# Patient Record
Sex: Female | Born: 1950
Health system: Southern US, Community
[De-identification: ages and names within clinical notes are randomized; demographics above are authoritative.]

## PROBLEM LIST (undated history)

## (undated) DIAGNOSIS — Z975 Presence of (intrauterine) contraceptive device: Secondary | ICD-10-CM

## (undated) DIAGNOSIS — E785 Hyperlipidemia, unspecified: Secondary | ICD-10-CM

## (undated) DIAGNOSIS — N814 Uterovaginal prolapse, unspecified: Secondary | ICD-10-CM

## (undated) DIAGNOSIS — N329 Bladder disorder, unspecified: Secondary | ICD-10-CM

## (undated) DIAGNOSIS — K219 Gastro-esophageal reflux disease without esophagitis: Secondary | ICD-10-CM

## (undated) HISTORY — DX: Hyperlipidemia, unspecified: E78.5

## (undated) HISTORY — DX: Bladder disorder, unspecified: N32.9

## (undated) HISTORY — PX: BIOPSY BREAST: PRO8

## (undated) HISTORY — DX: Presence of (intrauterine) contraceptive device: Z97.5

## (undated) HISTORY — DX: Uterovaginal prolapse, unspecified: N81.4

## (undated) HISTORY — PX: BREAST BIOPSY: SHX20

---

## 1976-09-13 HISTORY — PX: OTHER SURGICAL HISTORY: SHX169

## 1988-09-13 HISTORY — PX: APPENDECTOMY: SHX54

## 1998-06-17 ENCOUNTER — Other Ambulatory Visit: Admission: RE | Admit: 1998-06-17 | Discharge: 1998-06-17 | Payer: Self-pay | Admitting: Gynecology

## 1999-07-31 ENCOUNTER — Other Ambulatory Visit: Admission: RE | Admit: 1999-07-31 | Discharge: 1999-07-31 | Payer: Self-pay | Admitting: Gynecology

## 1999-07-31 ENCOUNTER — Encounter: Payer: Self-pay | Admitting: Gynecology

## 1999-07-31 ENCOUNTER — Encounter: Admission: RE | Admit: 1999-07-31 | Discharge: 1999-07-31 | Payer: Self-pay | Admitting: Gynecology

## 2000-06-13 ENCOUNTER — Other Ambulatory Visit: Admission: RE | Admit: 2000-06-13 | Discharge: 2000-06-13 | Payer: Self-pay | Admitting: Obstetrics and Gynecology

## 2001-06-30 ENCOUNTER — Encounter: Payer: Self-pay | Admitting: Obstetrics and Gynecology

## 2001-06-30 ENCOUNTER — Encounter: Admission: RE | Admit: 2001-06-30 | Discharge: 2001-06-30 | Payer: Self-pay | Admitting: Obstetrics and Gynecology

## 2001-06-30 ENCOUNTER — Other Ambulatory Visit: Admission: RE | Admit: 2001-06-30 | Discharge: 2001-06-30 | Payer: Self-pay | Admitting: Obstetrics and Gynecology

## 2002-06-26 ENCOUNTER — Other Ambulatory Visit: Admission: RE | Admit: 2002-06-26 | Discharge: 2002-06-26 | Payer: Self-pay | Admitting: Obstetrics and Gynecology

## 2003-08-28 ENCOUNTER — Encounter: Payer: Self-pay | Admitting: Family Medicine

## 2003-08-28 ENCOUNTER — Encounter: Admission: RE | Admit: 2003-08-28 | Discharge: 2003-08-28 | Payer: Self-pay | Admitting: Obstetrics and Gynecology

## 2003-08-28 ENCOUNTER — Other Ambulatory Visit: Admission: RE | Admit: 2003-08-28 | Discharge: 2003-08-28 | Payer: Self-pay | Admitting: Obstetrics and Gynecology

## 2005-12-02 ENCOUNTER — Other Ambulatory Visit: Admission: RE | Admit: 2005-12-02 | Discharge: 2005-12-02 | Payer: Self-pay | Admitting: Obstetrics & Gynecology

## 2005-12-02 ENCOUNTER — Ambulatory Visit (HOSPITAL_COMMUNITY): Admission: RE | Admit: 2005-12-02 | Discharge: 2005-12-02 | Payer: Self-pay | Admitting: Obstetrics and Gynecology

## 2005-12-02 ENCOUNTER — Encounter: Payer: Self-pay | Admitting: Family Medicine

## 2005-12-20 ENCOUNTER — Encounter: Admission: RE | Admit: 2005-12-20 | Discharge: 2005-12-20 | Payer: Self-pay | Admitting: Obstetrics and Gynecology

## 2005-12-22 ENCOUNTER — Encounter (INDEPENDENT_AMBULATORY_CARE_PROVIDER_SITE_OTHER): Payer: Self-pay | Admitting: Specialist

## 2005-12-22 ENCOUNTER — Encounter: Admission: RE | Admit: 2005-12-22 | Discharge: 2005-12-22 | Payer: Self-pay | Admitting: Obstetrics and Gynecology

## 2007-01-05 ENCOUNTER — Encounter: Payer: Self-pay | Admitting: Family Medicine

## 2007-01-05 ENCOUNTER — Encounter: Admission: RE | Admit: 2007-01-05 | Discharge: 2007-01-05 | Payer: Self-pay | Admitting: Obstetrics and Gynecology

## 2007-01-05 ENCOUNTER — Other Ambulatory Visit: Admission: RE | Admit: 2007-01-05 | Discharge: 2007-01-05 | Payer: Self-pay | Admitting: Obstetrics & Gynecology

## 2007-10-06 ENCOUNTER — Encounter: Admission: RE | Admit: 2007-10-06 | Discharge: 2007-10-06 | Payer: Self-pay | Admitting: Obstetrics and Gynecology

## 2007-10-06 ENCOUNTER — Encounter: Payer: Self-pay | Admitting: Family Medicine

## 2008-06-05 IMAGING — MG MM DIAGNOSTIC BILATERAL
7 series · 7 of 7 positions shown · non-contrast
Comparison: 12-02-05.

DG DIAGNOSTIC BILATERAL
Bilateral CC and MLO view(s) were taken.
Technologist: Werner Raut

RIGHT BREAST ULTRASOUND
DIGITAL BILATERAL DIAGNOSTIC MAMMOGRAM WITH CAD AND RIGHT BREAST ULTRASOUND:
CLINICAL DATA: Benign changes in the left breast biopsied - follow-up.  Also palpable nodule in 
the lateral subareolar right breast on clinical examination.

[R CC]
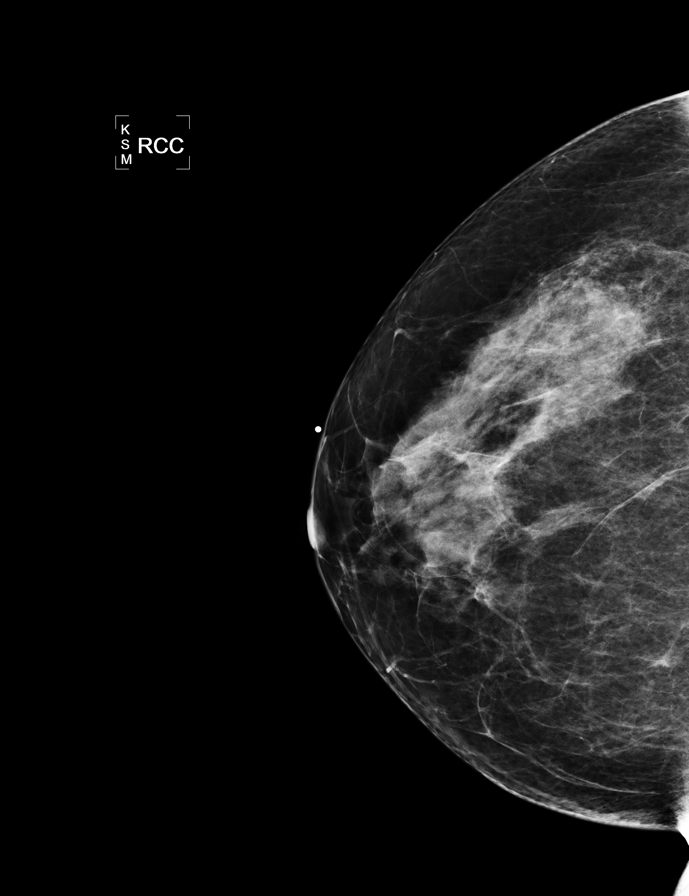

[L CC]
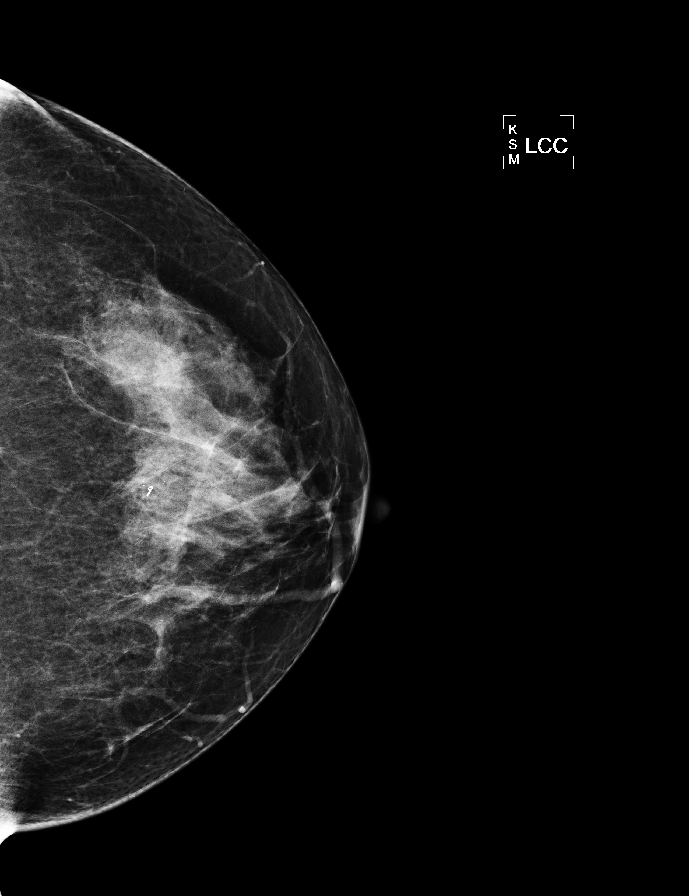

[L MLO (1 of 2)]
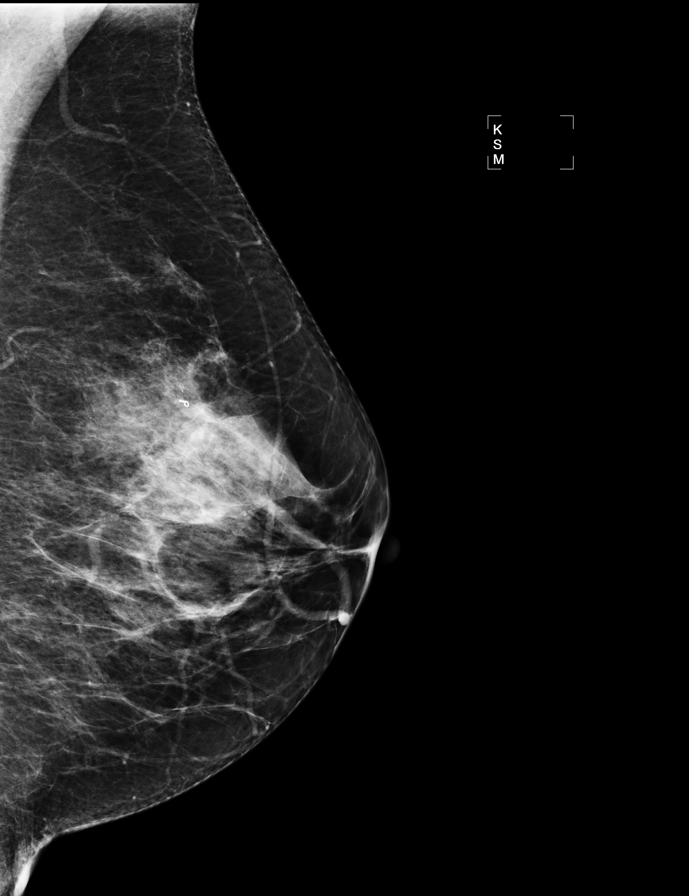

[R MLO (1 of 2)]
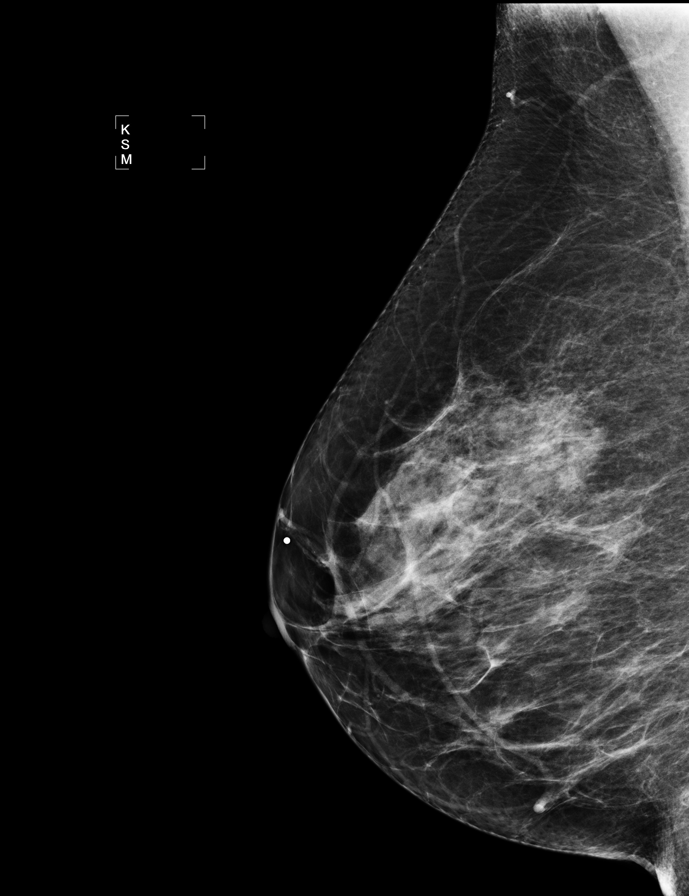

[R TAN]
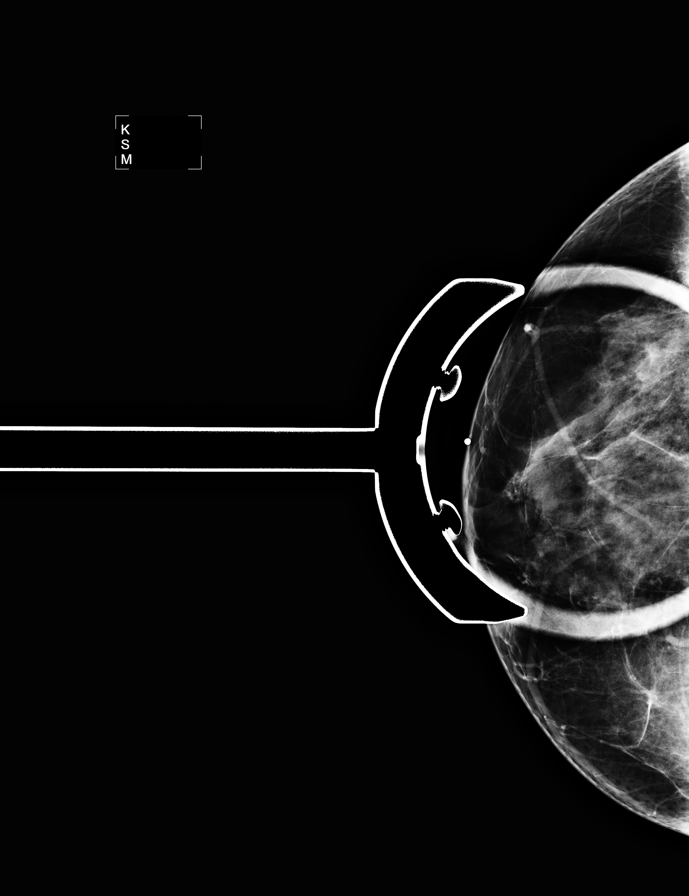

[L MLO (2 of 2)]
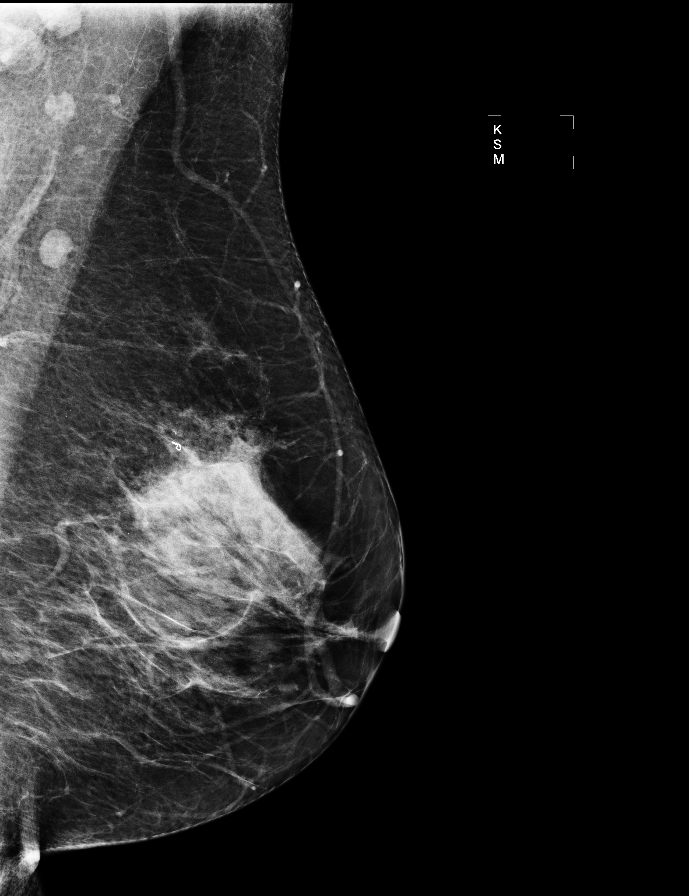

[R MLO (2 of 2)]
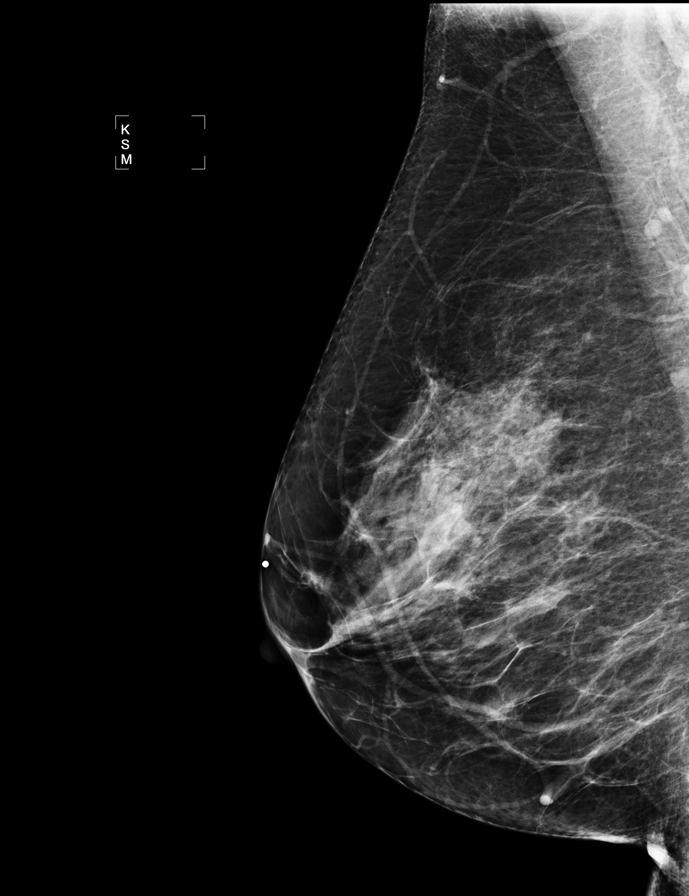

[7 of 7 positions shown; findings below may reference images not displayed]

CC, MLO views of both breasts and spot tangential view of the region of the patient's palpable 
abnormality were performed.  A biopsy clip and a few small microcalcifications are again noted in 
the upper left breast.  No new microcalcifications are identified.  There is no evidence of mass or
malignant-type calcifications identified.

Physical examination of the right breast demonstrates minimal nodularity in the lateral subareolar 
region.

Targeted ultrasound evaluation of the right breast demonstrates a 3 x 7 x 8 mm well-circumscribed 
hypoechoic nodule in the 8 o'clock position 5 cm from the nipple is identified and exhibits mild 
increased through transmission - likely benign.  There is no focal abnormality in the entire 
subareolar region.
IMPRESSION: 1.  Likely benign nodule in the inferior medial right breast -  fibroadenoma or possibly a lymph 
node.  Recommend right breast ultrasound follow-up in six months to insure stability.
2.  No evidence of abnormality in the lateral right subareolar region.
3.  Stable postbiopsy changes within the upper left breast without new microcalcifications.

ASSESSMENT: Probably benign - BI-RADS 3

Ultrasound of the right breast in 6 months.
,

## 2008-06-28 ENCOUNTER — Encounter: Admission: RE | Admit: 2008-06-28 | Discharge: 2008-06-28 | Payer: Self-pay | Admitting: Obstetrics and Gynecology

## 2008-06-28 ENCOUNTER — Encounter: Payer: Self-pay | Admitting: Family Medicine

## 2010-01-30 ENCOUNTER — Ambulatory Visit: Payer: Self-pay | Admitting: Family Medicine

## 2010-01-30 ENCOUNTER — Encounter (INDEPENDENT_AMBULATORY_CARE_PROVIDER_SITE_OTHER): Payer: Self-pay | Admitting: *Deleted

## 2010-01-30 ENCOUNTER — Other Ambulatory Visit: Admission: RE | Admit: 2010-01-30 | Discharge: 2010-01-30 | Payer: Self-pay | Admitting: Family Medicine

## 2010-01-30 LAB — HM PAP SMEAR

## 2010-02-02 LAB — CONVERTED CEMR LAB
ALT: 13 units/L (ref 0–35)
AST: 20 units/L (ref 0–37)
Albumin: 4.4 g/dL (ref 3.5–5.2)
Cholesterol: 259 mg/dL — ABNORMAL HIGH (ref 0–200)
Creatinine, Ser: 0.6 mg/dL (ref 0.4–1.2)
Direct LDL: 188.2 mg/dL
Sodium: 141 meq/L (ref 135–145)
Total Protein: 7 g/dL (ref 6.0–8.3)

## 2010-02-18 ENCOUNTER — Encounter: Payer: Self-pay | Admitting: Family Medicine

## 2010-02-18 LAB — CONVERTED CEMR LAB: Pap Smear: NEGATIVE

## 2010-06-25 ENCOUNTER — Encounter: Admission: RE | Admit: 2010-06-25 | Discharge: 2010-06-25 | Payer: Self-pay | Admitting: Family Medicine

## 2010-06-25 LAB — HM MAMMOGRAPHY

## 2010-10-04 ENCOUNTER — Encounter: Payer: Self-pay | Admitting: Family Medicine

## 2010-10-15 NOTE — Letter (Signed)
Summary: Results Follow up Letter  [facility]  [address] [place], [place] [address]   Phone: [phone]  Fax: [phone]    02/18/2010 MRN: [id number]  [facility] [address] [place], [place]  [address]  Dear Ms. Glotfelty,  The following are the results of your recent test(s):  Test         Result    Pap Smear:        Normal __X___  Not Normal _____ Comments: ______________________________________________________ Cholesterol: LDL(Bad cholesterol):         Your goal is less than:         HDL (Good cholesterol):       Your goal is more than: Comments:  ______________________________________________________ Mammogram:        Normal _____  Not Normal _____ Comments:  ___________________________________________________________________ Hemoccult:        Normal _____  Not normal _______ Comments:    _____________________________________________________________________ Other Tests:    We routinely do not discuss normal results over the telephone.  If you desire a copy of the results, or you have any questions about this information we can discuss them at your next office visit.   Sincerely,       Ruthe Mannan, MD

## 2010-10-15 NOTE — Assessment & Plan Note (Signed)
Summary: NEW PT TO ESTABH/DLO   Vital Signs:  Patient profile:   60 year old female Height:      60.5 inches Weight:      112.50 pounds BMI:     21.69 Temp:     98.0 degrees F oral Pulse rate:   64 / minute Pulse rhythm:   regular BP sitting:   110 / 70  (left arm) Cuff size:   regular  Vitals Entered By: Linde Gillis CMA Duncan Dull) (Jan 30, 2010 2:07 PM) CC: new patient, establish care   History of Present Illness: 60 yo female here to establish care.  No medical problems other than bladder prolapse. Was seeing her OBGYN for this but opted out of surgery. Considering pessary. She does not have issues with incontinence but has discomfort when she lifts heavy objects at work.  Very physically active job.  Well woman- has not had cholesterol checked in several years. Never had a colonscopy, due for mammogram. G1P1, has been post menopausal since 2003, no h/o post menopausal bleeding. No h/o abnormal pap smears.  Sexually active with one partner.  Preventive Screening-Counseling & Management  Alcohol-Tobacco     Smoking Status: never  Current Medications (verified): 1)  None  Allergies (verified): No Known Drug Allergies  Past History:  Family History: Last updated: 01/30/2010 Mom and dad both lived into 58s, unknown medical probs. Sister had uterine CA in 30s.  Social History: Last updated: 01/30/2010 Married Never Smoked Alcohol use-no Works in a mill. Has one son, 35, lives near by.  Risk Factors: Smoking Status: never (01/30/2010)  Past Medical History: Unremarkable  Past Surgical History: Appendectomy  Family History: Reviewed history and no changes required. Mom and dad both lived into 41s, unknown medical probs. Sister had uterine CA in 30s.  Social History: Reviewed history and no changes required. 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 Smoking Status:  never  Review of Systems      See  HPI General:  Denies chills, fatigue, and malaise. Eyes:  Denies blurring. ENT:  Denies difficulty swallowing. CV:  Denies chest pain or discomfort and difficulty breathing at night. Resp:  Denies shortness of breath. GI:  Denies abdominal pain, bloody stools, and change in bowel habits. GU:  Denies incontinence, urinary frequency, and urinary hesitancy. MS:  Denies joint pain, joint redness, and joint swelling. Derm:  Denies rash. Neuro:  Denies headaches. Psych:  Denies anxiety and depression. Endo:  Denies cold intolerance and heat intolerance. Heme:  Denies abnormal bruising and bleeding.  Physical Exam  General:  alert and well-developed.   Head:  normocephalic and atraumatic.   Eyes:  vision grossly intact and pupils equal.   Ears:  R ear normal and L ear normal.   Nose:  no external deformity.   Mouth:  no gingival abnormalities.   Neck:  No deformities, masses, or tenderness noted. Breasts:  No mass, nodules, thickening, tenderness, bulging, retraction, inflamation, nipple discharge or skin changes noted.   Lungs:  Normal respiratory effort, chest expands symmetrically. Lungs are clear to auscultation, no crackles or wheezes. Heart:  Normal rate and regular rhythm. S1 and S2 normal without gallop, murmur, click, rub or other extra sounds. Abdomen:  Bowel sounds positive,abdomen soft and non-tender without masses, organomegaly or hernias noted. Genitalia:  Pelvic Exam:        External: normal female genitalia without lesions or masses        Vagina:  normal without lesions or masses        Cervix: normal without lesions or masses        Adnexa: normal bimanual exam without masses or fullness        Uterus: normal by palpation        Pap smear: performed Extremities:  no edema Neurologic:  No cranial nerve deficits noted. Station and gait are normal. Plantar reflexes are down-going bilaterally. DTRs are symmetrical throughout. Sensory, motor and coordinative functions appear  intact. Psych:  Cognition and judgment appear intact. Alert and cooperative with normal attention span and concentration. No apparent delusions, illusions, hallucinations   Impression & Recommendations:  Problem # 1:  HEALTH MAINTENANCE EXAM (ICD-V70.0) Reviewed preventive care protocols, scheduled due services, and updated immunizations Discussed nutrition, exercise, diet, and healthy lifestyle.  FLP, BMET, hepatic panel today. Pap smear today. Set up colonoscopy and mammogram today. Discussed Zostavax, she will check with insurance plan to make sure it is covered. Orders: TLB-BMP (Basic Metabolic Panel-BMET) (80048-METABOL)  Other Orders: Gastroenterology Referral (GI) Radiology Referral (Radiology) Venipuncture ([address]) TLB-Lipid Panel (80061-LIPID) TLB-Hepatic/Liver Function Pnl (80076-HEPATIC) Pap Smear, Thin Prep ( Collection of) (U0454)  Patient Instructions: 1)  Great to meet you. 2)  Please stop by to see Shirlee Limerick on your way out  She will set up your mammogram and colonoscopy. 3)  We will be in touch about your labs next week.  Prior Medications (reviewed today): None Current Allergies (reviewed today): No known allergies  TD Result Date:  01/18/2002 TD Result:  historical TD Next Due:  10 yr

## 2010-10-15 NOTE — Letter (Signed)
Summary: Previsit letter  [facility]  [address] [place], [place] [address]   Phone: [phone]  Fax: [phone]       01/30/2010 MRN: [id number]  [facility] [address] [place], [place]  [address]  Dear Ms. Seehafer,  Welcome to the Gastroenterology Division at [facility].    You are scheduled to see a nurse for your pre-procedure visit on 03/27/2010 at 9:00AM on the 3rd floor at [facility], [address]  We ask that you try to arrive at our office 15 minutes prior to your appointment time to allow for check-in.  Your nurse visit will consist of discussing your medical and surgical history, your immediate family medical history, and your medications.    Please bring a complete list of all your medications or, if you prefer, bring the medication bottles and we will list them.  We will need to be aware of both prescribed and over the counter drugs.  We will need to know exact dosage information as well.  If you are on blood thinners (Coumadin, Plavix, Aggrenox, Ticlid, etc.) please call our office today/prior to your appointment, as we need to consult with your physician about holding your medication.   Please be prepared to read and sign documents such as consent forms, a financial agreement, and acknowledgement forms.  If necessary, and with your consent, a friend or relative is welcome to sit-in on the nurse visit with you.  Please bring your insurance card so that we may make a copy of it.  If your insurance requires a referral to see a specialist, please bring your referral form from your primary care physician.  No co-pay is required for this nurse visit.     If you cannot keep your appointment, please call [phone] to cancel or reschedule prior to your appointment date.  This allows [name] the opportunity to schedule an appointment for another patient in need of care.    Thank you for choosing [facility] for your medical  needs.  We appreciate the opportunity to care for you.  Please visit [name] at our website  to learn more about our practice.                     Sincerely.                                                                                                                   The Gastroenterology Division

## 2010-12-03 ENCOUNTER — Encounter: Payer: Self-pay | Admitting: Family Medicine

## 2010-12-04 ENCOUNTER — Encounter: Payer: Self-pay | Admitting: Family Medicine

## 2010-12-04 ENCOUNTER — Ambulatory Visit (INDEPENDENT_AMBULATORY_CARE_PROVIDER_SITE_OTHER): Payer: BC Managed Care – PPO | Admitting: Family Medicine

## 2010-12-04 VITALS — BP 118/70 | HR 56 | Temp 97.9°F | Wt 115.0 lb

## 2010-12-04 DIAGNOSIS — M79673 Pain in unspecified foot: Secondary | ICD-10-CM | POA: Insufficient documentation

## 2010-12-04 DIAGNOSIS — B070 Plantar wart: Secondary | ICD-10-CM

## 2010-12-04 DIAGNOSIS — M79609 Pain in unspecified limb: Secondary | ICD-10-CM

## 2010-12-04 NOTE — Progress Notes (Signed)
  Subjective:    Patient ID: Heidi Mitchell, female    DOB: [birth year], 60 y.o.   MRN: [id number]  HPI CC: sore spots on feet  Several month history of sore spots on feet, hurt to walk and stand.  Sore L mid transverse pad.  Sore R anterior to transverse pad.  H/o this in past, usually resolves with time.    No h/o DM.  No fevers/chills.  Uses tennis shoes.  Not too tight in front.  Feels comfortable.  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    Took simvastatin for 3 mo, then stopped.  Has been focusing on husband who had stroke.  Review of Systems Per HPI    Objective:   Physical Exam  [vitalsreviewed. Constitutional: She appears well-developed and well-nourished. No distress.  Musculoskeletal:       L sole: 3-4 plantar warts with callus overlying, along midline along transverse arch.  + loss of transverse arch.  Tender to palpation. R sole: 4 warts medial edge of transverse arch as well as 3 plantar warts along lateral edge.  + loss of transverse arch.          Assessment & Plan:

## 2010-12-04 NOTE — Assessment & Plan Note (Addendum)
Causing callus and leg pain. Treat with pumice stone, soaking in epsom water, and salicylic acid 40%.  No h/o DM. Discussed care of warts. Return if too irritating or any concerns. F/u next visit.  Consider podiatry referral if no improvement.

## 2010-12-04 NOTE — Patient Instructions (Addendum)
Make appointment in May with Dr. Dayton Martes. I think you have callus from plantar warts.   Treat with salicylic acid (Duofilm or mediplast):  Pumice stone to file off dead skin, then soak foot in warm salt water, then use salicylic acid for 3 days at a time.  Then take off, restart pumice stone and repeat.  If foot getting too raw, take break for several days then repeat.  Duofilm should stay dry and firmly in place.  If gets wet, reapply. Call us with questions.  If too irritation, let us know.

## 2010-12-04 NOTE — Assessment & Plan Note (Signed)
With metatarsal collapse (loss of transverse arch). Provided with metatarsal pad, small.  Fitted and applied. Pt states feeling better with walking.

## 2010-12-05 ENCOUNTER — Encounter: Payer: Self-pay | Admitting: Family Medicine

## 2011-01-27 ENCOUNTER — Other Ambulatory Visit: Payer: Self-pay | Admitting: Family Medicine

## 2011-01-27 DIAGNOSIS — Z79899 Other long term (current) drug therapy: Secondary | ICD-10-CM

## 2011-01-27 DIAGNOSIS — Z0000 Encounter for general adult medical examination without abnormal findings: Secondary | ICD-10-CM

## 2011-01-27 DIAGNOSIS — E785 Hyperlipidemia, unspecified: Secondary | ICD-10-CM | POA: Insufficient documentation

## 2011-01-29 ENCOUNTER — Other Ambulatory Visit (INDEPENDENT_AMBULATORY_CARE_PROVIDER_SITE_OTHER): Payer: BC Managed Care – PPO | Admitting: Family Medicine

## 2011-01-29 DIAGNOSIS — F1910 Other psychoactive substance abuse, uncomplicated: Secondary | ICD-10-CM

## 2011-01-29 DIAGNOSIS — E785 Hyperlipidemia, unspecified: Secondary | ICD-10-CM

## 2011-01-29 DIAGNOSIS — Z79899 Other long term (current) drug therapy: Secondary | ICD-10-CM

## 2011-01-29 LAB — LIPID PANEL
Cholesterol: 215 mg/dL — ABNORMAL HIGH (ref 0–200)
HDL: 61.1 mg/dL (ref >39.00)
Triglycerides: 33 mg/dL (ref 0.0–149.0)

## 2011-01-29 LAB — HEPATIC FUNCTION PANEL
ALT: 10 U/L (ref 0–35)
Albumin: 3.6 g/dL (ref 3.5–5.2)
Total Bilirubin: 0.9 mg/dL (ref 0.3–1.2)
Total Protein: 6.1 g/dL (ref 6.0–8.3)

## 2011-01-29 LAB — LDL CHOLESTEROL, DIRECT: Direct LDL: 164.7 mg/dL

## 2011-02-05 ENCOUNTER — Other Ambulatory Visit: Payer: BC Managed Care – PPO

## 2011-02-11 ENCOUNTER — Ambulatory Visit (INDEPENDENT_AMBULATORY_CARE_PROVIDER_SITE_OTHER): Payer: BC Managed Care – PPO | Admitting: Family Medicine

## 2011-02-11 ENCOUNTER — Encounter: Payer: Self-pay | Admitting: Family Medicine

## 2011-02-11 ENCOUNTER — Telehealth: Payer: Self-pay | Admitting: *Deleted

## 2011-02-11 VITALS — BP 110/70 | HR 52 | Temp 98.0°F | Ht 60.5 in | Wt 114.8 lb

## 2011-02-11 DIAGNOSIS — Z0000 Encounter for general adult medical examination without abnormal findings: Secondary | ICD-10-CM

## 2011-02-11 DIAGNOSIS — F411 Generalized anxiety disorder: Secondary | ICD-10-CM

## 2011-02-11 DIAGNOSIS — F419 Anxiety disorder, unspecified: Secondary | ICD-10-CM | POA: Insufficient documentation

## 2011-02-11 MED ORDER — ALPRAZOLAM 0.25 MG PO TABS: 0.2500 mg | ORAL_TABLET | Freq: Three times a day (TID) | ORAL | Status: DC | PRN

## 2011-02-11 NOTE — Telephone Encounter (Signed)
Patient is asking if she should have had a pap smear done with her cpx since she was at high risk for HPV with her last pap. Please advise.

## 2011-02-11 NOTE — Assessment & Plan Note (Signed)
Will start low dose as needed Xanax. The patient indicates understanding of these issues and agrees with the plan.

## 2011-02-11 NOTE — Assessment & Plan Note (Signed)
Reviewed preventive care protocols, scheduled due services, and updated immunizations Discussed nutrition, exercise, diet, and healthy lifestyle.  IFOB ordered today. 

## 2011-02-11 NOTE — Progress Notes (Signed)
60 yo female here to establish care.  Well woman-  Never had a colonscopy, due for mammogram. Did not keep her appointment, husband had a stroke in August.  He has some aphasia, otherwise doing well.  G1P1, has been post menopausal since 2003, no h/o post menopausal bleeding. No h/o abnormal pap smears.  Sexually active with one partner. Last pap smear was normal 01/2010.  Anxiety- having occasional panic attacks since her husband's stroke.  Always worried it will happen again. Denies any symptoms of depression. No SI or HI.  HLD- improved! Has not been taking her Simvastatin in over 6 months, working on diet. Lab Results  Component Value Date   CHOL 215* 01/29/2011   CHOL 259* 01/30/2010   Lab Results  Component Value Date   HDL 61.10 01/29/2011   HDL 16.10 01/30/2010   No results found for this basename: [facility]   Lab Results  Component Value Date   TRIG 33.0 01/29/2011   TRIG 71.0 01/30/2010   Lab Results  Component Value Date   CHOLHDL 4 01/29/2011   CHOLHDL 4 01/30/2010   Lab Results  Component Value Date   LDLDIRECT 164.7 01/29/2011   LDLDIRECT 188.2 01/30/2010     The PMH, PSH, Social History, Family History, Medications, and allergies have been reviewed in [facility], and have been updated if relevant.   Review of Systems       See HPI General:  Denies chills, fatigue, and malaise. Eyes:  Denies blurring. ENT:  Denies difficulty swallowing. CV:  Denies chest pain or discomfort and difficulty breathing at night. Resp:  Denies shortness of breath. GI:  Denies abdominal pain, bloody stools, and change in bowel habits. GU:  Denies incontinence, urinary frequency, and urinary hesitancy. MS:  Denies joint pain, joint redness, and joint swelling. Derm:  Denies rash. Neuro:  Denies headaches. Psych:  Denies anxiety and depression. Endo:  Denies cold intolerance and heat intolerance. Heme:  Denies abnormal bruising and bleeding.  Physical Exam BP 110/70  Pulse 52   Temp(Src) 98 F (36.7 C) (Oral)  Ht 5' 0.5" (1.537 m)  Wt 114 lb 12.8 oz (52.073 kg)  BMI 22.05 kg/m2  General:  alert and well-developed.   Head:  normocephalic and atraumatic.   Eyes:  vision grossly intact and pupils equal.   Ears:  R ear normal and L ear normal.   Nose:  no external deformity.   Mouth:  no gingival abnormalities.   Neck:  No deformities, masses, or tenderness noted. Breasts:  No mass, nodules, thickening, tenderness, bulging, retraction, inflamation, nipple discharge or skin changes noted.   Lungs:  Normal respiratory effort, chest expands symmetrically. Lungs are clear to auscultation, no crackles or wheezes. Heart:  Normal rate and regular rhythm. S1 and S2 normal without gallop, murmur, click, rub or other extra sounds. Abdomen:  Bowel sounds positive,abdomen soft and non-tender without masses, organomegaly or hernias noted. Extremities:  no edema Neurologic:  No cranial nerve deficits noted. Station and gait are normal. Plantar reflexes are down-going bilaterally. DTRs are symmetrical throughout. Sensory, motor and coordinative functions appear intact. Psych:  Cognition and judgment appear intact. Alert and cooperative with normal attention span and concentration. No apparent delusions, illusions, hallucinations

## 2011-02-11 NOTE — Telephone Encounter (Signed)
Patient advised as instructed via telephone. 

## 2011-02-11 NOTE — Patient Instructions (Signed)
IMPORTANT: HOW TO USE THIS INFORMATION:  This is a summary and does NOT have all possible information about this product. This information does not assure that this product is safe, effective, or appropriate for you. This information is not individual medical advice and does not substitute for the advice of your health care professional. Always ask your health care professional for complete information about this product and your specific health needs.    ALPRAZOLAM EXTENDED-RELEASE - ORAL (al-PRA-[id number]-lam)    COMMON BRAND NAME(S): Xanax XR    USES:  This medication is used to treat the panic and anxiety symptoms associated with panic disorder. Alprazolam belongs to a class of medications called benzodiazepines which act on the brain and nerves (central nervous system) to produce a calming effect. It works by enhancing the effects of a certain natural chemical in the body (GABA).    HOW TO USE:  Take this medication by mouth once daily usually in the morning; or as directed by your doctor. Do not crush or chew extended-release tablets. Doing so can release all of the drug at once, increasing the risk of side effects. Also, do not split the tablets unless they have a score line and your doctor or pharmacist tells you to do so. Swallow the whole or split tablet without crushing or chewing. This medication may cause withdrawal reactions, especially if it has been used regularly for a long time or in high doses. In such cases, withdrawal symptoms (such as seizures) may occur if you suddenly stop using this medication. To prevent withdrawal reactions, your doctor may reduce your dose gradually. Consult your doctor or pharmacist for more details, and report any withdrawal reactions immediately. Though it is very unlikely to occur, this medication can also result in abnormal drug-seeking behavior (addiction/habit-forming). Do not increase your dose, take it more frequently, or use it for a longer period of time than  prescribed. Properly stop the medication when so directed. This will lessen the chances of becoming addicted. When used for an extended period, this medication may not work as well and may require different dosing. Talk with your doctor if this medication stops working well. Inform your doctor if your condition persists or worsens.    SIDE EFFECTS:  Drowsiness, dizziness, headache, tiredness, clumsiness, decreased interest in sex, decreased appetite, nausea, constipation, or joint pain may occur. If any of these effects persist or worsen, notify your doctor or pharmacist promptly. Remember that your doctor has prescribed this medication because he or she has judged that the benefit to you is greater than the risk of side effects. Many people using this medication do not have serious side effects. Tell your doctor immediately if any of these serious side effects occur: mental/mood changes, confusion, slurred speech or difficulty speaking, difficulty remembering things. An allergic reaction to this drug is unlikely, but seek immediate medical attention if it occurs. Symptoms of an allergic reaction include: rash, itching/swelling (especially of the face/tongue/throat), severe dizziness, trouble breathing. This is not a complete list of possible side effects. If you notice other effects not listed above, contact your doctor or pharmacist. In the [name] - Call your doctor for medical advice about side effects. You may report side effects to FDA at 1-800-FDA-1088. In [place] - Call your doctor for medical advice about side effects. You may report side effects to [facility] [place] at [phone].    PRECAUTIONS:  Before taking alprazolam, tell your doctor or pharmacist if you are allergic to it; or to other benzodiazepines (  e.g., diazepam, lorazepam); or if you have any other allergies. This product may contain inactive ingredients, which can cause allergic reactions or other problems. Talk to your pharmacist for more  details. Before using this medication, tell your doctor or pharmacist your medical history, especially of: kidney disease, liver disease, severe lung/breathing problems (e.g., COPD, sleep apnea), drug or alcohol abuse. This drug may make you dizzy or drowsy. Do not drive, use machinery, or do any activity that requires alertness until you are sure you can perform such activities safely. Avoid alcoholic beverages. Caution is advised when using this drug in the elderly because they may be more sensitive to its side effects, especially loss of coordination and drowsiness. This medication is not recommended for use during pregnancy due to the potential for harm to an unborn baby. Consult your doctor for more details. This drug passes into breast milk and may have undesirable effects on a nursing infant. Therefore, breast-feeding while using this medication is not recommended. Consult your doctor before breast-feeding.    DRUG INTERACTIONS:  See also the How to Use section. Your healthcare professionals (e.g., doctor or pharmacist) may already be aware of any possible drug interactions and may be monitoring you for it. Do not start, stop or change the dosage of any medicine before checking with them first. This drug should not be used with the following medications because very serious interactions may occur: certain azole antifungals (e.g., itraconazole, ketoconazole), delavirdine, indinavir, sodium oxybate. If you are currently using any of these medications, tell your doctor or pharmacist before starting alprazolam. Before using this medication, tell your doctor or pharmacist of all prescription and nonprescription/herbal products you may use, especially of: clozapine, digoxin, disulfiram, kava. Tell your doctor or pharmacist if you take drugs that affect the removal of alprazolam from your system (CYP 3A4 substrates, inhibitors and inducers) such as: other azole antifungals (e.g., fluconazole, voriconazole),  certain anti-depressants (e.g., fluoxetine, fluvoxamine, nefazodone), certain anti-seizure medications (e.g., phenytoin, phenobarbital), cimetidine, macrolide antibiotics (e.g., erythromycin, clarithromycin), rifamycins (e.g., rifampin), ritonavir, St John's wort. Tell your doctor or pharmacist if you take drugs that cause drowsiness such as: antihistamines that cause drowsiness (e.g., diphenhydramine), anti-seizure drugs (e.g., carbamazepine), medicine for sleep (e.g., sedatives), muscle relaxants, narcotic pain relievers (e.g., codeine), psychiatric medicines (e.g., phenothiazines such as chlorpromazine, tricyclic anti-depressants such as amitriptyline), tranquilizers. Check the labels on all your medicines (e.g., cough-and-cold products) because they may contain ingredients which cause drowsiness. Ask your pharmacist about the safe use of those products. Cigarette smoking decreases blood levels of this medication (through liver enzyme induction). Tell your doctor if you smoke or if you have recently stopped smoking because your dose may need to be adjusted. Do not start or stop any medicine without doctor or pharmacist approval. This document does not contain all possible interactions. Therefore, before using this product, tell your doctor or pharmacist of all the products you use. Keep a list of all your medications with you, and share the list with your doctor and pharmacist.    OVERDOSE:  If overdose is suspected, contact your local poison control center or emergency room immediately. [name] residents can call the [name] national poison hotline at [phone]. [place] residents should call their local poison control center directly. Symptoms of overdose may include: severe drowsiness, slowed/reduced reflexes, slowed breathing, loss of consciousness.    NOTES:  Do not share this medication with others. It is against the law. If this drug is used for an extended period of time, laboratory and/or medical  tests  (e.g., liver function tests, complete blood count) may be performed periodically to check for side effects. Consult your doctor for more details.    MISSED DOSE:  If you miss a dose, take it as soon as you remember. If it is near the time of the next dose, skip the missed dose and resume your usual dosing schedule. Do not double the dose to catch up.    STORAGE:  Store at room temperature (77 degrees F or 25 degrees C) away from light and moisture. Brief storage between 59-86 degrees F (15-30 degrees C) is permitted. Do not store in the bathroom. Keep all medicines away from children and pets. Do not flush medications down the toilet or pour them into a drain unless instructed to do so. Properly discard this product when it is expired or no longer needed. Consult your pharmacist or local waste disposal company for more details about how to safely discard your product.    Information last revised June 2010 Copyright(c) 2010 [employer].

## 2011-02-11 NOTE — Telephone Encounter (Signed)
Yes please apologize to her that I only saw that it was negative for malignancy when I was reviewing her chart.  Meaning that it was a negative pap but positive for HPV. Have her come in at her convenience and we will recheck a pap free of charge. If it comes back normal AND neg for HPV, we can continue with 2-3 year screening. Once again, please apologize for me. Thank you.

## 2011-02-16 ENCOUNTER — Other Ambulatory Visit: Payer: BC Managed Care – PPO

## 2011-02-16 ENCOUNTER — Other Ambulatory Visit: Payer: Self-pay | Admitting: Family Medicine

## 2011-02-16 ENCOUNTER — Encounter: Payer: Self-pay | Admitting: *Deleted

## 2011-02-16 DIAGNOSIS — Z1211 Encounter for screening for malignant neoplasm of colon: Secondary | ICD-10-CM

## 2011-02-16 LAB — FECAL OCCULT BLOOD, IMMUNOCHEMICAL: Fecal Occult Bld: NEGATIVE

## 2011-02-19 ENCOUNTER — Ambulatory Visit: Payer: BC Managed Care – PPO | Admitting: Family Medicine

## 2011-02-26 ENCOUNTER — Ambulatory Visit (INDEPENDENT_AMBULATORY_CARE_PROVIDER_SITE_OTHER): Payer: BC Managed Care – PPO | Admitting: Family Medicine

## 2011-02-26 ENCOUNTER — Other Ambulatory Visit (HOSPITAL_COMMUNITY)
Admission: RE | Admit: 2011-02-26 | Discharge: 2011-02-26 | Disposition: A | Discharge status: Home / Self Care | Payer: BC Managed Care – PPO | Source: Ambulatory Visit | Attending: Family Medicine | Admitting: Family Medicine

## 2011-02-26 ENCOUNTER — Encounter: Payer: Self-pay | Admitting: Family Medicine

## 2011-02-26 VITALS — BP 102/62 | HR 60 | Temp 98.7°F | Wt 114.0 lb

## 2011-02-26 DIAGNOSIS — Z01419 Encounter for gynecological examination (general) (routine) without abnormal findings: Secondary | ICD-10-CM

## 2011-02-26 DIAGNOSIS — R87810 Cervical high risk human papillomavirus (HPV) DNA test positive: Secondary | ICD-10-CM | POA: Insufficient documentation

## 2011-02-26 NOTE — Progress Notes (Signed)
Addended by: Gilmer Mor on: 02/26/2011 02:24 PM   Modules accepted: Orders

## 2011-02-26 NOTE — Progress Notes (Signed)
60 yo female here for pap smear only.   G1P1, has been post menopausal since 2003, no h/o post menopausal bleeding.   Sexually active with one partner. Last pap smear was neg for malignancy in 01/2010 but pos for HPV. She denies any dysuria, vaginal discharge or abdominal discomfort.     The PMH, PSH, Social History, Family History, Medications, and allergies have been reviewed in [facility], and have been updated if relevant.   Review of Systems       See HPI ing and bleeding.  Physical Exam BP 102/62  Pulse 60  Temp(Src) 98.7 F (37.1 C) (Oral)  Wt 114 lb (51.71 kg) General:  Well-developed,well-nourished,in no acute distress; alert,appropriate and cooperative throughout examination Head:  normocephalic and atraumatic.   Mouth:  good dentition.   Neck:  No deformities, masses, or tenderness noted. Rectal:  no external abnormalities.   Genitalia:  Pelvic Exam:        External: normal female genitalia without lesions or masses        Vagina: normal without lesions or masses        Cervix: normal without lesions or masses        Adnexa: normal bimanual exam without masses or fullness        Uterus: normal by palpation        Pap smear: performed Neurologic:  alert & oriented X3 and gait normal.   Skin:  Intact without suspicious lesions or rashes Psych:  Cognition and judgment appear intact. Alert and cooperative with normal attention span and concentration. No apparent delusions, illusions, hallucinations  1. Gynecological examination   Pap performed today.

## 2011-03-05 ENCOUNTER — Other Ambulatory Visit: Payer: Self-pay | Admitting: Family Medicine

## 2011-03-05 DIAGNOSIS — R87811 Vaginal high risk human papillomavirus (HPV) DNA test positive: Secondary | ICD-10-CM

## 2012-03-31 ENCOUNTER — Other Ambulatory Visit (INDEPENDENT_AMBULATORY_CARE_PROVIDER_SITE_OTHER): Payer: BC Managed Care – PPO

## 2012-03-31 ENCOUNTER — Other Ambulatory Visit: Payer: Self-pay | Admitting: Family Medicine

## 2012-03-31 DIAGNOSIS — E785 Hyperlipidemia, unspecified: Secondary | ICD-10-CM

## 2012-03-31 DIAGNOSIS — Z0000 Encounter for general adult medical examination without abnormal findings: Secondary | ICD-10-CM

## 2012-03-31 LAB — COMPREHENSIVE METABOLIC PANEL
Albumin: 4 g/dL (ref 3.5–5.2)
Alkaline Phosphatase: 49 U/L (ref 39–117)
CO2: 28 mEq/L (ref 19–32)
Chloride: 104 mEq/L (ref 96–112)
Glucose, Bld: 101 mg/dL — ABNORMAL HIGH (ref 70–99)
Potassium: 4 mEq/L (ref 3.5–5.1)
Sodium: 139 mEq/L (ref 135–145)
Total Bilirubin: 1.2 mg/dL (ref 0.3–1.2)

## 2012-03-31 LAB — LIPID PANEL
Cholesterol: 261 mg/dL — ABNORMAL HIGH (ref 0–200)
Triglycerides: 64 mg/dL (ref 0.0–149.0)
VLDL: 12.8 mg/dL (ref 0.0–40.0)

## 2012-04-03 ENCOUNTER — Other Ambulatory Visit: Payer: BC Managed Care – PPO

## 2012-04-07 ENCOUNTER — Other Ambulatory Visit: Payer: BC Managed Care – PPO

## 2012-04-13 ENCOUNTER — Inpatient Hospital Stay (HOSPITAL_COMMUNITY): Admission: RE | Admit: 2012-04-13 | Payer: Self-pay | Source: Ambulatory Visit

## 2012-04-13 ENCOUNTER — Other Ambulatory Visit (HOSPITAL_COMMUNITY)
Admission: RE | Admit: 2012-04-13 | Discharge: 2012-04-13 | Disposition: A | Discharge status: Home / Self Care | Payer: BC Managed Care – PPO | Source: Ambulatory Visit | Attending: Family Medicine | Admitting: Family Medicine

## 2012-04-13 ENCOUNTER — Encounter: Payer: Self-pay | Admitting: Family Medicine

## 2012-04-13 ENCOUNTER — Ambulatory Visit (INDEPENDENT_AMBULATORY_CARE_PROVIDER_SITE_OTHER): Payer: BC Managed Care – PPO | Admitting: Family Medicine

## 2012-04-13 VITALS — BP 98/70 | HR 48 | Temp 97.6°F | Ht 60.5 in | Wt 113.0 lb

## 2012-04-13 DIAGNOSIS — Z1211 Encounter for screening for malignant neoplasm of colon: Secondary | ICD-10-CM

## 2012-04-13 DIAGNOSIS — Z1151 Encounter for screening for human papillomavirus (HPV): Secondary | ICD-10-CM | POA: Insufficient documentation

## 2012-04-13 DIAGNOSIS — E785 Hyperlipidemia, unspecified: Secondary | ICD-10-CM

## 2012-04-13 DIAGNOSIS — F419 Anxiety disorder, unspecified: Secondary | ICD-10-CM

## 2012-04-13 DIAGNOSIS — Z1231 Encounter for screening mammogram for malignant neoplasm of breast: Secondary | ICD-10-CM

## 2012-04-13 DIAGNOSIS — Z01419 Encounter for gynecological examination (general) (routine) without abnormal findings: Secondary | ICD-10-CM | POA: Insufficient documentation

## 2012-04-13 DIAGNOSIS — F411 Generalized anxiety disorder: Secondary | ICD-10-CM

## 2012-04-13 DIAGNOSIS — Z0000 Encounter for general adult medical examination without abnormal findings: Secondary | ICD-10-CM

## 2012-04-13 MED ORDER — ALPRAZOLAM 0.25 MG PO TABS: 0.2500 mg | ORAL_TABLET | Freq: Three times a day (TID) | ORAL | Status: DC | PRN

## 2012-04-13 MED ORDER — SIMVASTATIN 10 MG PO TABS: 10.0000 mg | ORAL_TABLET | Freq: Every day | ORAL | Status: DC

## 2012-04-13 NOTE — Patient Instructions (Signed)
Great to see you. Please restart your Zocor- we can recheck your cholesterol in 3 months. Please stop by to see Shirlee Limerick on your way out to set up your mammogram.

## 2012-04-13 NOTE — Progress Notes (Signed)
61 yo here for CPX.  Never had a colonscopy, due for mammogram. She is refusing colonoscopy and zostavax.  G1P1, has been post menopausal since 2003, no h/o post menopausal bleeding. No h/o abnormal pap smears.  Sexually active with one partner. Last pap smear was neg for cancer but positive for HPV in 02/2011.  Had a colpo afterwards- neg.  Anxiety- still having occasional panic attacks since her husband's stroke although much less often now that he is recovering. Denies any symptoms of depression. No SI or HI.  HLD- deteriorated.  Admits to not taking her zocor after a couple of months.  Did not cause muscle aches or other side effects. Lab Results  Component Value Date   CHOL 261* 03/31/2012   HDL 64.80 03/31/2012   LDLDIRECT 166.5 03/31/2012   TRIG 64.0 03/31/2012   CHOLHDL 4 03/31/2012      The PMH, PSH, Social History, Family History, Medications, and allergies have been reviewed in [facility], and have been updated if relevant.   Review of Systems       See HPI General:  Denies chills, fatigue, and malaise. Eyes:  Denies blurring. ENT:  Denies difficulty swallowing. CV:  Denies chest pain or discomfort and difficulty breathing at night. Resp:  Denies shortness of breath. GI:  Denies abdominal pain, bloody stools, and change in bowel habits. GU:  Denies incontinence, urinary frequency, and urinary hesitancy. MS:  Denies joint pain, joint redness, and joint swelling. Derm:  Denies rash. Neuro:  Denies headaches. Psych:  Denies anxiety and depression. Endo:  Denies cold intolerance and heat intolerance. Heme:  Denies abnormal bruising and bleeding.  Physical Exam BP 98/70  Pulse 48  Temp 97.6 F (36.4 C)  Ht 5' 0.5" (1.537 m)  Wt 113 lb (51.256 kg)  BMI 21.71 kg/m2   General:  Well-developed,well-nourished,in no acute distress; alert,appropriate and cooperative throughout examination Head:  normocephalic and atraumatic.   Eyes:  vision grossly intact, pupils equal,  pupils round, and pupils reactive to light.   Ears:  R ear normal and L ear normal.   Nose:  no external deformity.   Mouth:  good dentition.   Neck:  No deformities, masses, or tenderness noted. Breasts:  No mass, nodules, thickening, tenderness, bulging, retraction, inflamation, nipple discharge or skin changes noted.   Lungs:  Normal respiratory effort, chest expands symmetrically. Lungs are clear to auscultation, no crackles or wheezes. Heart:  Normal rate and regular rhythm. S1 and S2 normal without gallop, murmur, click, rub or other extra sounds. Abdomen:  Bowel sounds positive,abdomen soft and non-tender without masses, organomegaly or hernias noted. Rectal:  no external abnormalities.   Genitalia:  Pelvic Exam:        External: normal female genitalia without lesions or masses        Vagina: normal without lesions or masses        Cervix: normal without lesions or masses        Adnexa: normal bimanual exam without masses or fullness        Uterus: normal by palpation        Pap smear: performed Msk:  No deformity or scoliosis noted of thoracic or lumbar spine.   Extremities:  No clubbing, cyanosis, edema, or deformity noted with normal full range of motion of all joints.   Neurologic:  alert & oriented X3 and gait normal.   Skin:  Intact without suspicious lesions or rashes Cervical Nodes:  No lymphadenopathy noted Axillary Nodes:  No palpable  lymphadenopathy Psych:  Cognition and judgment appear intact. Alert and cooperative with normal attention span and concentration. No apparent delusions, illusions, hallucinations   Assessment and Plan: 1. Routine general medical examination at a health care facility  Reviewed preventive care protocols, scheduled due services, and updated immunizations Discussed nutrition, exercise, diet, and healthy lifestyle.  Cytology - PAP  2. Hyperlipidemia  Deteriorated.  She agreed to restart the zocor.   3. Anxiety  Rx for xanax refilled.   4.  Other screening mammogram  MM Digital Screening  5. Screening for colon cancer  Refusing colonoscopy. IFOB ordered. Fecal occult blood, imunochemical

## 2012-04-18 ENCOUNTER — Encounter: Payer: Self-pay | Admitting: Family Medicine

## 2012-04-18 ENCOUNTER — Encounter: Payer: Self-pay | Admitting: *Deleted

## 2012-04-18 ENCOUNTER — Other Ambulatory Visit: Payer: BC Managed Care – PPO

## 2012-04-18 DIAGNOSIS — Z1211 Encounter for screening for malignant neoplasm of colon: Secondary | ICD-10-CM

## 2012-04-18 LAB — FECAL OCCULT BLOOD, IMMUNOCHEMICAL: Fecal Occult Bld: NEGATIVE

## 2012-04-19 ENCOUNTER — Encounter: Payer: Self-pay | Admitting: *Deleted

## 2012-04-19 ENCOUNTER — Encounter: Payer: Self-pay | Admitting: Family Medicine

## 2012-04-21 ENCOUNTER — Telehealth: Payer: Self-pay

## 2012-04-21 NOTE — Telephone Encounter (Signed)
Her pap smear was neg for cancer.  Please ask pathology if they can run HPV- I don't see those results. Thanks.

## 2012-04-21 NOTE — Telephone Encounter (Signed)
Pt got letter that pap smear was free of CA cells. Pt said for 3 years pt had cervical cancer virus; is that cleared now?Please advise.

## 2012-04-25 NOTE — Telephone Encounter (Signed)
Add- on request faxed to cytology.  Left message asking pt to call back.

## 2012-04-26 NOTE — Telephone Encounter (Signed)
Advised pt that I will call her when HPV results are available.

## 2012-04-28 ENCOUNTER — Encounter: Payer: Self-pay | Admitting: *Deleted

## 2012-04-28 ENCOUNTER — Ambulatory Visit
Admission: RE | Admit: 2012-04-28 | Discharge: 2012-04-28 | Disposition: A | Discharge status: Home / Self Care | Payer: BC Managed Care – PPO | Source: Ambulatory Visit | Attending: Family Medicine | Admitting: Family Medicine

## 2012-04-28 DIAGNOSIS — Z1231 Encounter for screening mammogram for malignant neoplasm of breast: Secondary | ICD-10-CM

## 2012-08-31 ENCOUNTER — Telehealth: Payer: Self-pay | Admitting: Family Medicine

## 2012-08-31 NOTE — Telephone Encounter (Signed)
Patient Information:  Caller Name: Amantha  Phone: [phone]  Patient: Heidi Mitchell, Heidi Mitchell  Gender: Female  DOB: 1950-10-12  Age: 61 Years  PCP: Ruthe Mannan [facility])  Office Follow Up:  Does the office need to follow up with this patient?: No  Instructions For The Office: N/A   Symptoms  Reason For Call & Symptoms: Having break through bleeding, especially when lifting heavy objects.  Reviewed Health History In EMR: Yes  Reviewed Medications In EMR: Yes  Reviewed Allergies In EMR: Yes  Reviewed Surgeries / Procedures: Yes  Date of Onset of Symptoms: 08/17/2012  Guideline(s) Used:  Postpartum - Vaginal Bleeding and Lochia  Vaginal Bleeding - Abnormal  Disposition Per Guideline:   See Within 2 Weeks in Office  Reason For Disposition Reached:   Bleeding or spotting between regular periods occurs more than two cycles (2 months),  Advice Given:  N/A  Appointment Scheduled:  09/05/2012 08:15:54 Appointment Scheduled Provider:  Kerby Nora (Family Practice)

## 2012-08-31 NOTE — Telephone Encounter (Signed)
Noted  

## 2012-09-05 ENCOUNTER — Ambulatory Visit (INDEPENDENT_AMBULATORY_CARE_PROVIDER_SITE_OTHER): Payer: BC Managed Care – PPO | Admitting: Family Medicine

## 2012-09-05 ENCOUNTER — Encounter: Payer: Self-pay | Admitting: Family Medicine

## 2012-09-05 VITALS — BP 112/62 | HR 54 | Temp 97.6°F | Ht 65.0 in | Wt 116.8 lb

## 2012-09-05 DIAGNOSIS — N950 Postmenopausal bleeding: Secondary | ICD-10-CM | POA: Insufficient documentation

## 2012-09-05 LAB — POCT URINALYSIS DIPSTICK
Bilirubin, UA: NEGATIVE
Glucose, UA: NEGATIVE
Nitrite, UA: NEGATIVE
Spec Grav, UA: 1.01
Urobilinogen, UA: 0.2

## 2012-09-05 NOTE — Progress Notes (Signed)
Subjective:    Patient ID: Heidi Mitchell, female    DOB: 25-May-1951, 61 y.o.   MRN: [id number]  HPI  61 year old post menopausal female pt of Dr. Elmer Sow presents with vaginal bleeding, spotting lightly in last 2-3 months. Started as small streak on underware then has increased some.  She has history of "fallen bladder". She comes in now because she noted a larger amount 4 days ago... No clots, made commode pink. Mild lower abdominal soreness. Has history of constipation. No unexpected weight loss. No new night sweats, no fever.  no dysuria. No pain with BMS.  Has not seen blood in urine or BM.   She see a GYN Dr. Genice Rouge. Last saw her about 2 years ago.  She has history of HPV, colposcopy performed.   Went through menopause at age [age] years ago.  Family cancer: sister with cervical cancer?  Review of Systems  Constitutional: Negative for fever and fatigue.  HENT: Negative for ear pain.   Eyes: Negative for pain.  Respiratory: Negative for chest tightness and shortness of breath.   Cardiovascular: Negative for chest pain, palpitations and leg swelling.  Gastrointestinal: Negative for abdominal pain.  Genitourinary: Negative for dysuria.       Objective:   Physical Exam  Constitutional: Vital signs are normal. She appears well-developed and well-nourished. She is cooperative.  Non-toxic appearance. She does not appear ill. No distress.  HENT:  Head: Normocephalic.  Right Ear: Hearing, tympanic membrane, external ear and ear canal normal. Tympanic membrane is not erythematous, not retracted and not bulging.  Left Ear: Hearing, tympanic membrane, external ear and ear canal normal. Tympanic membrane is not erythematous, not retracted and not bulging.  Nose: No mucosal edema or rhinorrhea. Right sinus exhibits no maxillary sinus tenderness and no frontal sinus tenderness. Left sinus exhibits no maxillary sinus tenderness and no frontal sinus tenderness.  Mouth/Throat: Uvula is  midline, oropharynx is clear and moist and mucous membranes are normal.  Eyes: Conjunctivae normal, EOM and lids are normal. Pupils are equal, round, and reactive to light. No foreign bodies found.  Neck: Trachea normal and normal range of motion. Neck supple. Carotid bruit is not present. No mass and no thyromegaly present.  Cardiovascular: Normal rate, regular rhythm, S1 normal, S2 normal, normal heart sounds, intact distal pulses and normal pulses.  Exam reveals no gallop and no friction rub.   No murmur heard. Pulmonary/Chest: Effort normal and breath sounds normal. Not tachypneic. No respiratory distress. She has no decreased breath sounds. She has no wheezes. She has no rhonchi. She has no rales.  Abdominal: Soft. Normal appearance and bowel sounds are normal. There is no tenderness. Hernia confirmed negative in the right inguinal area and confirmed negative in the left inguinal area.  Genitourinary: Rectum normal. Rectal exam shows no external hemorrhoid, no internal hemorrhoid, no fissure, no mass, no tenderness and anal tone normal. Guaiac negative stool. There is no rash, tenderness, lesion or injury on the right labia. There is no rash, tenderness, lesion or injury on the left labia. Uterus is not deviated, not enlarged, not fixed and not tender. Cervix exhibits no motion tenderness, no discharge and no friability. Right adnexum displays no mass, no tenderness and no fullness. Left adnexum displays no mass, no tenderness and no fullness. No erythema, tenderness or bleeding around the vagina. No foreign body around the vagina. No signs of injury around the vagina. No vaginal discharge found.  Lymphadenopathy:       Right:  No inguinal adenopathy present.       Left: No inguinal adenopathy present.  Neurological: She is alert.  Skin: Skin is warm, dry and intact. No rash noted.  Psychiatric: Her speech is normal and behavior is normal. Judgment and thought content normal. Her mood appears not  anxious. Cognition and memory are normal. She does not exhibit a depressed mood.          Assessment & Plan:

## 2012-09-05 NOTE — Patient Instructions (Addendum)
Stop at front desk to set up GYN referral.

## 2012-09-05 NOTE — Assessment & Plan Note (Signed)
Urine showed no blood. No clear sign of blood from rectum. Most likely vaginal source. On GYN exam: no clear source of bleeding from aginal, external or cervix. Will refer to GYN for endometrial biopsy. Will hold on [name] until then given they likely have there.

## 2012-11-27 ENCOUNTER — Encounter: Payer: Self-pay | Admitting: Obstetrics and Gynecology

## 2012-12-01 ENCOUNTER — Encounter: Payer: Self-pay | Admitting: Obstetrics and Gynecology

## 2012-12-01 ENCOUNTER — Ambulatory Visit (INDEPENDENT_AMBULATORY_CARE_PROVIDER_SITE_OTHER): Payer: BC Managed Care – PPO | Admitting: Obstetrics and Gynecology

## 2012-12-01 ENCOUNTER — Telehealth: Payer: Self-pay | Admitting: Obstetrics and Gynecology

## 2012-12-01 VITALS — BP 120/66 | Wt 117.0 lb

## 2012-12-01 DIAGNOSIS — N814 Uterovaginal prolapse, unspecified: Secondary | ICD-10-CM

## 2012-12-01 MED ORDER — ESTROGENS, CONJUGATED 0.625 MG/GM VA CREA: TOPICAL_CREAM | Freq: Every day | VAGINAL | Status: DC

## 2012-12-01 NOTE — Progress Notes (Signed)
62 y.o. MarriedWhite female G1P1001 here for pessary insertion. She was fitted Nov 10, 2012 with a #3 ring with support, and is here for insertion and instructions.   Exam:   BP 120/66  Wt 117 lb (53.071 kg)  BMI 19.47 kg/m2  LMP 11/10/2012 General appearance: alert, cooperative and appears stated age    Pelvic: External genitalia:  no lesions              Urethra: not indicated and normal appearing urethra with no masses, tenderness or lesions              Bartholins and Skenes: Bartholin's, Urethra, Skene's normal                 Vagina:  atrophicGr 2 cystocele and cervix about 3 cm behind introitus              Cervix: normal appearance Bimanual Exam:  Uterus:  uterus is normal size, shape, consistency and nontender                               Adnexa:    not indicated                               Anus:  defer exam  Pessary was inserted  without difficulty. Pt was shown pessary, and how to apply estrogen cream.  Fully instructed and questions answered. She sees here PCP for annual exams and paps so she may return here prn.  Rx sent for premarin cream to use around ring of pessary.     An After Visit Summary was printed and given to the patient.

## 2012-12-01 NOTE — Patient Instructions (Signed)
Prolapse  Prolapse means the falling down, bulging, dropping, or drooping of a body part. Organs that commonly prolapse include the rectum, small intestine, bladder, urethra, vagina (birth canal), uterus (womb), and cervix. Prolapse occurs when the ligaments and muscle tissue around the rectum, bladder, and uterus are damaged or weakened.  CAUSES  This happens especially with:  Childbirth. Some women feel pelvic pressure or have trouble holding their urine right after childbirth, because of stretching and tearing of pelvic tissues. This generally gets better with time and the feeling usually goes away, but it may return with aging.  Chronic heavy lifting.  Aging.  Menopause, with loss of estrogen production weakening the pelvic ligaments and muscles.  Past pelvic surgery.  Obesity.  Chronic constipation.  Chronic cough. Prolapse may affect a single organ, or several organs may prolapse at the same time. The front wall of the vagina holds up the bladder. The back wall holds up part of the lower intestine, or rectum. The uterus fills a spot in the middle. All these organs can be involved when the ligaments and muscles around the vagina relax too much. This often gets worse when women stop producing estrogen (menopause). SYMPTOMS  Uncontrolled loss of urine (incontinence) with cough, sneeze, straining, and exercise.  More force may be required to have a bowel movement, due to trapping of the stool.  When part of an organ bulges through the opening of the vagina, there is sometimes a feeling of heaviness or pressure. It may feel as though something is falling out. This sensation increases with coughing or bearing down.  If the organs protrude through the opening of the vagina and rub against the clothing, there may be soreness, ulcers, infection, pain, and bleeding.  Lower back pain.  Pushing in the upper or lower part of the vagina, to pass urine or have a bowel movement.  Problems  having sexual intercourse.  Being unable to insert a tampon or applicator. DIAGNOSIS  Usually, a physical exam is all that is needed to identify the problem. During the examination, you may be asked to cough and strain while lying down, sitting up, and standing up. Your caregiver will determine if more testing is required, such as bladder function tests. Some diagnoses are:  Cystocele: Bulging and falling of the bladder into the top of the vagina.  Rectocele: Part of the rectum bulging into the vagina.  Prolapse of the uterus: The uterus falls or drops into the vagina.  Enterocele: Bulging of the top of the vagina, after a hysterectomy (uterus removal), with the small intestine bulging into the vagina. A hernia in the top of the vagina.  Urethrocele: The urethra (urine carrying tube) bulging into the vagina. TREATMENT  In most cases, prolapse needs to be treated only if it produces symptoms. If the symptoms are interfering with your usual daily or sexual activities, treatment may be necessary. The following are some measures that may be used to treat prolapse.  Estrogen may help elderly women with mild prolapse.  Kegel exercises may help mild cases of prolapse, by strengthening and tightening the muscles of the pelvic floor.  Pessaries are used in women who choose not to, or are unable to, have surgery. A pessary is a doughnut-shaped piece of plastic or rubber that is put into the vagina to keep the organs in place. This device must be fitted by your caregiver. Your caregiver will also explain how to care for yourself with the pessary. If it works well for you,   to keep the organs in place. This device must be fitted by your caregiver. Your caregiver will also explain how to care for yourself with the pessary. If it works well for you, this may be the only treatment required.   Surgery is often the only form of treatment for more severe prolapses. There are different types of surgery available. You should discuss what the best procedure is for you. If the uterus is prolapsed, it may be removed (hysterectomy) as part of the surgical treatment. Your caregiver will  discuss the risks and benefits with you.   Uterine-vaginal suspension (surgery to hold up the organs) may be used, especially if you want to maintain your fertility.  No form of treatment is guaranteed to correct the prolapse or relieve the symptoms.  HOME CARE INSTRUCTIONS    Wear a sanitary pad or absorbent product if you have incontinence of urine.   Avoid heavy lifting and straining with exercise and work.   Take over-the-counter pain medicine for minor discomfort.   Try taking estrogen or using estrogen vaginal cream.   Try Kegel exercises or use a pessary, before deciding to have surgery.   Do Kegel exercises after having a baby.  SEEK MEDICAL CARE IF:    Your symptoms interfere with your daily activities.   You need medicine to help with the discomfort.   You need to be fitted with a pessary.   You notice bleeding from the vagina.   You think you have ulcers or you notice ulcers on the cervix.   You have an oral temperature above 102 F (38.9 C).   You develop pain or blood with urination.   You have bleeding with a bowel movement.   The symptoms are interfering with your sex life.   You have urinary incontinence that interferes with your daily activities.   You lose urine with sexual intercourse.   You have a chronic cough.   You have chronic constipation.  Document Released: 03/06/2003 Document Revised: 11/22/2011 Document Reviewed: 09/14/2009  ExitCare Patient Information 2013 [employer].

## 2012-12-01 NOTE — Telephone Encounter (Signed)
Yes, I do think the cream is the best way to go.  Thanks.

## 2012-12-01 NOTE — Telephone Encounter (Signed)
Explained to patient that no generic available but Rx will last for 3 months or more.  Can check insurance preferred Tier list for vaginal estrogen options but cream may more beneficial to the tissue and tablet or ring may not work with prolapse.  Patient states she will use the cream if that is what CR prefers.

## 2012-12-01 NOTE — Telephone Encounter (Signed)
Prescription cream given today is too costly. Pt would like to change prescription. [facility] [address], [place], [place]

## 2012-12-19 ENCOUNTER — Telehealth: Payer: Self-pay | Admitting: Obstetrics and Gynecology

## 2012-12-19 NOTE — Telephone Encounter (Signed)
PATIENT STATES SHE IS HAVING IRRITATION AND SORENESS THE NEXT DAY AFTER USING CREAM TO INSERT PESSARY.  ALSO PATIENT REQUEST A LETTER BE WRITTEN FOR MEDICAL REASONS THAT SHE CAN NOT DO JURY DUTY DUE TO URINARY FREQUENCY SHE HAS. PLEASE ADVISE. Heidi Mitchell

## 2012-12-19 NOTE — Telephone Encounter (Signed)
problems with pessary/also wants letter from Dr. Precious Bard re: excuse from jury duty re: incontinence/[place]

## 2012-12-20 ENCOUNTER — Telehealth: Payer: Self-pay | Admitting: *Deleted

## 2012-12-20 NOTE — Telephone Encounter (Signed)
These days doctors notes don't get you out of jury duty. Can't help her there, sorry.  I need to see her for an ov about the irritation.

## 2012-12-20 NOTE — Telephone Encounter (Signed)
Left message on CB# of need to call our office to schedule appt. With Dr. Tresa Res. sue

## 2012-12-20 NOTE — Telephone Encounter (Signed)
PATIENT SCHEDULED APPT. FOR PROBLEM OF IRRITATION OF PESSARY WITH DR. Tresa Res. FOR MAY 2nd @ 2:30pm. WORK SHCEDULE SHE COULD ONLY COME ON Friday  AFTERNOONS. Heidi Mitchell

## 2012-12-22 ENCOUNTER — Telehealth: Payer: Self-pay | Admitting: Family Medicine

## 2012-12-22 ENCOUNTER — Encounter: Payer: Self-pay | Admitting: Family Medicine

## 2012-12-22 DIAGNOSIS — Z0279 Encounter for issue of other medical certificate: Secondary | ICD-10-CM

## 2012-12-22 NOTE — Telephone Encounter (Signed)
Advised patient letter is ready for pickup, placed at front desk.

## 2012-12-22 NOTE — Telephone Encounter (Signed)
Letter written. On my desk.

## 2012-12-22 NOTE — Telephone Encounter (Signed)
Advised patient, she will drop off summons.

## 2012-12-22 NOTE — Telephone Encounter (Signed)
Pt requesting letter to excuse her from jury duty r/t prolapsed bladder/uterus causing frequent urination.

## 2012-12-22 NOTE — Telephone Encounter (Signed)
Pt dropped off [employer] form to be filled out. Put form in your inbox on your desk.

## 2012-12-22 NOTE — Telephone Encounter (Signed)
Need jury letter in order to write this.

## 2012-12-26 ENCOUNTER — Telehealth: Payer: Self-pay

## 2012-12-26 NOTE — Telephone Encounter (Signed)
Pt got letter to be excused from jury duty; pt said in letter needs to include pts condition and how condition prevents her from serving on jury duty.Please advise.

## 2012-12-26 NOTE — Telephone Encounter (Signed)
Left message on voice mail advising patient letter is ready for pick up, letter placed at front desk.

## 2012-12-26 NOTE — Telephone Encounter (Signed)
New letter printed, placed on doctor's desk for signature.

## 2012-12-26 NOTE — Telephone Encounter (Signed)
Ok to addend letter in [employer] stating that she has prolapsed bladder which makes it difficult to sit for prolonged periods of time.

## 2013-01-03 ENCOUNTER — Telehealth: Payer: Self-pay | Admitting: Obstetrics and Gynecology

## 2013-01-03 NOTE — Telephone Encounter (Signed)
Pt cancelled appt for problem visit on 5/2 with dr Tresa Res. Did not specify why.

## 2013-01-12 ENCOUNTER — Ambulatory Visit: Payer: BC Managed Care – PPO | Admitting: Obstetrics and Gynecology

## 2013-04-12 ENCOUNTER — Other Ambulatory Visit: Payer: Self-pay | Admitting: *Deleted

## 2013-04-12 MED ORDER — SIMVASTATIN 10 MG PO TABS: 10.0000 mg | ORAL_TABLET | Freq: Every day | ORAL | Status: DC

## 2013-04-26 ENCOUNTER — Other Ambulatory Visit: Payer: Self-pay

## 2013-04-26 DIAGNOSIS — Z1231 Encounter for screening mammogram for malignant neoplasm of breast: Secondary | ICD-10-CM

## 2013-05-18 ENCOUNTER — Other Ambulatory Visit: Payer: BC Managed Care – PPO

## 2013-05-28 ENCOUNTER — Ambulatory Visit
Admission: RE | Admit: 2013-05-28 | Discharge: 2013-05-28 | Disposition: A | Discharge status: Home / Self Care | Payer: BC Managed Care – PPO | Source: Ambulatory Visit

## 2013-05-28 ENCOUNTER — Ambulatory Visit (INDEPENDENT_AMBULATORY_CARE_PROVIDER_SITE_OTHER): Payer: BC Managed Care – PPO | Admitting: Family Medicine

## 2013-05-28 ENCOUNTER — Encounter: Payer: Self-pay | Admitting: Family Medicine

## 2013-05-28 VITALS — BP 102/60 | HR 54 | Temp 97.7°F | Ht 60.5 in | Wt 110.0 lb

## 2013-05-28 DIAGNOSIS — Z0000 Encounter for general adult medical examination without abnormal findings: Secondary | ICD-10-CM

## 2013-05-28 DIAGNOSIS — R32 Unspecified urinary incontinence: Secondary | ICD-10-CM

## 2013-05-28 DIAGNOSIS — Z1211 Encounter for screening for malignant neoplasm of colon: Secondary | ICD-10-CM

## 2013-05-28 DIAGNOSIS — Z8742 Personal history of other diseases of the female genital tract: Secondary | ICD-10-CM | POA: Insufficient documentation

## 2013-05-28 DIAGNOSIS — Z1231 Encounter for screening mammogram for malignant neoplasm of breast: Secondary | ICD-10-CM

## 2013-05-28 DIAGNOSIS — Z79899 Other long term (current) drug therapy: Secondary | ICD-10-CM

## 2013-05-28 DIAGNOSIS — N329 Bladder disorder, unspecified: Secondary | ICD-10-CM

## 2013-05-28 DIAGNOSIS — E785 Hyperlipidemia, unspecified: Secondary | ICD-10-CM

## 2013-05-28 LAB — CBC WITH DIFFERENTIAL/PLATELET
Basophils Absolute: 0.1 10*3/uL (ref 0.0–0.1)
Basophils Relative: 0.9 % (ref 0.0–3.0)
Eosinophils Absolute: 0.1 10*3/uL (ref 0.0–0.7)
Lymphocytes Relative: 25 % (ref 12.0–46.0)
MCHC: 32.8 g/dL (ref 30.0–36.0)
MCV: 85.4 fl (ref 78.0–100.0)
Monocytes Absolute: 0.8 10*3/uL (ref 0.1–1.0)
Neutrophils Relative %: 61.7 % (ref 43.0–77.0)
Platelets: 201 10*3/uL (ref 150.0–400.0)
RDW: 15.9 % — ABNORMAL HIGH (ref 11.5–14.6)

## 2013-05-28 LAB — COMPREHENSIVE METABOLIC PANEL
ALT: 13 U/L (ref 0–35)
AST: 19 U/L (ref 0–37)
Albumin: 4.2 g/dL (ref 3.5–5.2)
Alkaline Phosphatase: 51 U/L (ref 39–117)
Calcium: 9.3 mg/dL (ref 8.4–10.5)
Chloride: 105 mEq/L (ref 96–112)
Potassium: 4.2 mEq/L (ref 3.5–5.1)
Sodium: 140 mEq/L (ref 135–145)
Total Protein: 7.1 g/dL (ref 6.0–8.3)

## 2013-05-28 LAB — LIPID PANEL
LDL Cholesterol: 115 mg/dL — ABNORMAL HIGH (ref 0–99)
VLDL: 12.2 mg/dL (ref 0.0–40.0)

## 2013-05-28 MED ORDER — PROGESTERONE MICRONIZED 200 MG PO CAPS: ORAL_CAPSULE | ORAL | Status: DC

## 2013-05-28 NOTE — Patient Instructions (Addendum)
Good to see you. We will call you with your lab results.  Please start prometrium as we discussed today- 200 mg every evening for 12 days per month (in a row).  Have a good vacation.

## 2013-05-28 NOTE — Progress Notes (Signed)
62 yo here for CPX.  Never had a colonscopy, going for mammogram today. She is refusing colonoscopy but does agree to IFOB. IFOB neg last year. Declines all vaccinations today, including zostavax, tdap and influenza.  G1P1, has been post menopausal since 2003.   Sexually active with one partner. Last pap smear was done by me in 04/2012- neg cytology, neg HPV. Previous pap smear was neg for cancer but positive for HPV in 02/2011.  Had a colpo afterwards- neg. Saw Dr. Tresa Res, GYN now who inserted pessary and has placed her on premarin (without progesterone).  She is no longer following up with her. She does feel pessary is helping with her urinary symptoms.  Anxiety- still having occasional panic attacks since her husband's stroke although much less often now that he is recovering. Denies any symptoms of depression.  Rarely uses Xanax. No SI or HI.  HLD- deteriorated.  Admits to not taking her zocor. Lab Results  Component Value Date   CHOL 261* 03/31/2012   HDL 64.80 03/31/2012   LDLDIRECT 166.5 03/31/2012   TRIG 64.0 03/31/2012   CHOLHDL 4 03/31/2012   Patient Active Problem List   Diagnosis Date Noted  . Gynecological examination 02/26/2011  . Routine general medical examination at a health care facility 02/11/2011  . Anxiety 02/11/2011  . Hyperlipidemia 01/27/2011  . Long term use of drug 01/27/2011  . Plantar warts 12/04/2010  . BLADDER PROLAPSE 01/30/2010   Past Medical History  Diagnosis Date  . Unspecified disorder of bladder   . Uterine prolapse    Past Surgical History  Procedure Laterality Date  . Appendectomy  1990  . Bartholins cyst  1978   History  Substance Use Topics  . Smoking status: Never Smoker   . Smokeless tobacco: Not on file  . Alcohol Use: No   No family history on file. No Known Allergies Current Outpatient Prescriptions on File Prior to Visit  Medication Sig Dispense Refill  . ALPRAZolam (XANAX) 0.25 MG tablet Take 0.25 mg by mouth at bedtime  as needed for sleep.      Marland Kitchen conjugated estrogens (PREMARIN) vaginal cream Place vaginally daily.  42.5 g  12  . simvastatin (ZOCOR) 10 MG tablet Take 1 tablet (10 mg total) by mouth at bedtime.  90 tablet  0   No current facility-administered medications on file prior to visit.      The PMH, PSH, Social History, Family History, Medications, and allergies have been reviewed in [facility], and have been updated if relevant.   Review of Systems       See HPI General:  Denies chills, fatigue, and malaise. Eyes:  Denies blurring. ENT:  Denies difficulty swallowing. CV:  Denies chest pain or discomfort and difficulty breathing at night. Resp:  Denies shortness of breath. GI:  Denies abdominal pain, bloody stools, and change in bowel habits. GU:  Denies incontinence, urinary frequency, and urinary hesitancy. MS:  Denies joint pain, joint redness, and joint swelling. Derm:  Denies rash. Neuro:  Denies headaches. Psych:  Denies anxiety and depression. Endo:  Denies cold intolerance and heat intolerance. Heme:  Denies abnormal bruising and bleeding.  Physical Exam BP 102/60  Pulse 54  Temp(Src) 97.7 F (36.5 C)  Ht 5' 0.5" (1.537 m)  Wt 110 lb (49.896 kg)  BMI 21.12 kg/m2  LMP 11/10/2012   General:  Well-developed,well-nourished,in no acute distress; alert,appropriate and cooperative throughout examination Head:  normocephalic and atraumatic.   Eyes:  vision grossly intact, pupils equal, pupils  round, and pupils reactive to light.   Ears:  R ear normal and L ear normal.   Nose:  no external deformity.   Mouth:  good dentition.   Neck:  No deformities, masses, or tenderness noted. Breasts:  No mass, nodules, thickening, tenderness, bulging, retraction, inflamation, nipple discharge or skin changes noted.   Lungs:  Normal respiratory effort, chest expands symmetrically. Lungs are clear to auscultation, no crackles or wheezes. Heart:  Normal rate and regular rhythm. S1 and S2 normal  without gallop, murmur, click, rub or other extra sounds. Abdomen:  Bowel sounds positive,abdomen soft and non-tender without masses, organomegaly or hernias noted. Msk:  No deformity or scoliosis noted of thoracic or lumbar spine.   Extremities:  No clubbing, cyanosis, edema, or deformity noted with normal full range of motion of all joints.   Neurologic:  alert & oriented X3 and gait normal.   Skin:  Intact without suspicious lesions or rashes Cervical Nodes:  No lymphadenopathy noted Axillary Nodes:  No palpable lymphadenopathy Psych:  Cognition and judgment appear intact. Alert and cooperative with normal attention span and concentration. No apparent delusions, illusions, hallucinations   Assessment and Plan: 1. Routine general medical examination at a health care facility Reviewed preventive care protocols, scheduled due services, and updated immunizations Discussed nutrition, exercise, diet, and healthy lifestyle.  - Comprehensive metabolic panel  2. Hyperlipidemia Recheck labs today. - Lipid Panel  3. Long term use of drug On unopposed daily estrogen.  Will add progesterone to prevent endometrial hyperplasia. Pt has a uterus and needs progesterone with estrogen to prevent endometrial hyperplasia (20-50% of patients on unopposed estrogen for 1 yr will develop hyperplasia). Start Prometrium 200 mg nightly for 12 consecutive nights per month. The patient indicates understanding of these issues and agrees with the plan.  - CBC with Differential  4. Urinary incontinence Improved with pessary.  5. Special screening for malignant neoplasms, colon  - Fecal occult blood, imunochemical; Future

## 2013-07-05 ENCOUNTER — Other Ambulatory Visit (INDEPENDENT_AMBULATORY_CARE_PROVIDER_SITE_OTHER): Payer: BC Managed Care – PPO

## 2013-07-05 DIAGNOSIS — Z1211 Encounter for screening for malignant neoplasm of colon: Secondary | ICD-10-CM

## 2013-07-05 LAB — FECAL OCCULT BLOOD, IMMUNOCHEMICAL: Fecal Occult Bld: NEGATIVE

## 2013-07-13 ENCOUNTER — Telehealth: Payer: Self-pay | Admitting: Internal Medicine

## 2013-07-13 NOTE — Telephone Encounter (Signed)
Called patient and clarified msg that was left regarding her stool culture, patient voiced understanding...ds,cma

## 2013-07-13 NOTE — Telephone Encounter (Signed)
07/13/2013  Pt left message returning call to [facility] office visit) regarding test done during last office visit.  Please return call to pt.

## 2014-07-15 ENCOUNTER — Encounter: Payer: Self-pay | Admitting: Family Medicine

## 2015-01-06 ENCOUNTER — Other Ambulatory Visit: Payer: Self-pay

## 2015-01-06 DIAGNOSIS — Z1231 Encounter for screening mammogram for malignant neoplasm of breast: Secondary | ICD-10-CM

## 2015-01-20 ENCOUNTER — Ambulatory Visit
Admission: RE | Admit: 2015-01-20 | Discharge: 2015-01-20 | Disposition: A | Discharge status: Home / Self Care | Payer: Commercial Managed Care - HMO | Source: Ambulatory Visit

## 2015-01-20 ENCOUNTER — Encounter: Payer: Self-pay | Admitting: Family Medicine

## 2015-01-20 ENCOUNTER — Ambulatory Visit (INDEPENDENT_AMBULATORY_CARE_PROVIDER_SITE_OTHER): Payer: Commercial Managed Care - HMO | Admitting: Family Medicine

## 2015-01-20 ENCOUNTER — Other Ambulatory Visit (HOSPITAL_COMMUNITY)
Admission: RE | Admit: 2015-01-20 | Discharge: 2015-01-20 | Disposition: A | Discharge status: Home / Self Care | Payer: Commercial Managed Care - HMO | Source: Ambulatory Visit | Attending: Family Medicine | Admitting: Family Medicine

## 2015-01-20 VITALS — BP 132/70 | HR 53 | Temp 97.8°F | Ht 60.25 in | Wt 117.2 lb

## 2015-01-20 DIAGNOSIS — Z124 Encounter for screening for malignant neoplasm of cervix: Secondary | ICD-10-CM | POA: Diagnosis not present

## 2015-01-20 DIAGNOSIS — Z8742 Personal history of other diseases of the female genital tract: Secondary | ICD-10-CM | POA: Diagnosis not present

## 2015-01-20 DIAGNOSIS — Z1151 Encounter for screening for human papillomavirus (HPV): Secondary | ICD-10-CM | POA: Insufficient documentation

## 2015-01-20 DIAGNOSIS — F419 Anxiety disorder, unspecified: Secondary | ICD-10-CM

## 2015-01-20 DIAGNOSIS — Z0000 Encounter for general adult medical examination without abnormal findings: Secondary | ICD-10-CM

## 2015-01-20 DIAGNOSIS — E785 Hyperlipidemia, unspecified: Secondary | ICD-10-CM

## 2015-01-20 DIAGNOSIS — Z01419 Encounter for gynecological examination (general) (routine) without abnormal findings: Secondary | ICD-10-CM | POA: Diagnosis not present

## 2015-01-20 DIAGNOSIS — Z1211 Encounter for screening for malignant neoplasm of colon: Secondary | ICD-10-CM | POA: Diagnosis not present

## 2015-01-20 DIAGNOSIS — Z1231 Encounter for screening mammogram for malignant neoplasm of breast: Secondary | ICD-10-CM | POA: Diagnosis not present

## 2015-01-20 DIAGNOSIS — N814 Uterovaginal prolapse, unspecified: Secondary | ICD-10-CM

## 2015-01-20 LAB — CBC WITH DIFFERENTIAL/PLATELET
Basophils Absolute: 0.1 K/uL (ref 0.0–0.1)
Basophils Relative: 0.8 % (ref 0.0–3.0)
Eosinophils Absolute: 0.1 K/uL (ref 0.0–0.7)
Eosinophils Relative: 1.7 % (ref 0.0–5.0)
HCT: 39.1 % (ref 36.0–46.0)
Hemoglobin: 13 g/dL (ref 12.0–15.0)
Lymphocytes Relative: 21.9 % (ref 12.0–46.0)
Lymphs Abs: 1.5 K/uL (ref 0.7–4.0)
MCHC: 33.2 g/dL (ref 30.0–36.0)
MCV: 84.3 fl (ref 78.0–100.0)
Monocytes Absolute: 0.7 K/uL (ref 0.1–1.0)
Monocytes Relative: 9.7 % (ref 3.0–12.0)
Neutro Abs: 4.5 K/uL (ref 1.4–7.7)
Neutrophils Relative %: 65.9 % (ref 43.0–77.0)
Platelets: 200 K/uL (ref 150.0–400.0)
RBC: 4.63 Mil/uL (ref 3.87–5.11)
RDW: 14.9 % (ref 11.5–15.5)
WBC: 6.8 K/uL (ref 4.0–10.5)

## 2015-01-20 LAB — COMPREHENSIVE METABOLIC PANEL WITH GFR
ALT: 11 U/L (ref 0–35)
AST: 17 U/L (ref 0–37)
Albumin: 3.9 g/dL (ref 3.5–5.2)
Alkaline Phosphatase: 55 U/L (ref 39–117)
BUN: 14 mg/dL (ref 6–23)
CO2: 31 meq/L (ref 19–32)
Calcium: 9.2 mg/dL (ref 8.4–10.5)
Chloride: 104 meq/L (ref 96–112)
Creatinine, Ser: 0.52 mg/dL (ref 0.40–1.20)
GFR: 125.62 mL/min
Glucose, Bld: 97 mg/dL (ref 70–99)
Potassium: 4.4 meq/L (ref 3.5–5.1)
Sodium: 138 meq/L (ref 135–145)
Total Bilirubin: 0.9 mg/dL (ref 0.2–1.2)
Total Protein: 6.7 g/dL (ref 6.0–8.3)

## 2015-01-20 LAB — LIPID PANEL
Cholesterol: 226 mg/dL — ABNORMAL HIGH (ref 0–200)
HDL: 64 mg/dL
LDL Cholesterol: 147 mg/dL — ABNORMAL HIGH (ref 0–99)
NonHDL: 162
Total CHOL/HDL Ratio: 4
Triglycerides: 75 mg/dL (ref 0.0–149.0)
VLDL: 15 mg/dL (ref 0.0–40.0)

## 2015-01-20 LAB — TSH: TSH: 1.2 u[IU]/mL (ref 0.35–4.50)

## 2015-01-20 NOTE — Assessment & Plan Note (Signed)
Progressive. Discussed referral to Dr. Zigmund Daniel (urogyn) at [facility] to discuss surgical options. She will think about it.

## 2015-01-20 NOTE — Patient Instructions (Signed)
Great to see you. Dr. Zigmund Daniel specializes in urogynecology at [facility].

## 2015-01-20 NOTE — Addendum Note (Signed)
Addended by: Ellamae Sia on: 01/20/2015 10:54 AM   Modules accepted: Orders

## 2015-01-20 NOTE — Assessment & Plan Note (Signed)
The patients weight, height, BMI and visual acuity have been recorded in the chart I have made referrals, counseling and provided education to the patient based review of the above and I have provided the pt with a written personalized care plan for preventive services.  Has declined all immunizations and colonoscopy.  Agrees to IFOB- ordered today with labs.

## 2015-01-20 NOTE — Progress Notes (Signed)
Pre visit review using our clinic review tool, if applicable. No additional management support is needed unless otherwise documented below in the visit note. 

## 2015-01-20 NOTE — Assessment & Plan Note (Signed)
Pap smear done today

## 2015-01-20 NOTE — Progress Notes (Signed)
Subjective:   Patient ID: Heidi Mitchell, female    DOB: 03/16/1951, 64 y.o.   MRN: [id number]  Heidi Mitchell is a pleasant 64 y.o. year old female who presents to clinic today with Annual Exam  on 01/20/2015  HPI:  Welcome to Medicare visit- desires pap smear today.  I have personally reviewed the Medicare Annual Wellness questionnaire and have noted 1. The patient's medical and social history 2. Their use of alcohol, tobacco or illicit drugs 3. Their current medications and supplements 4. The patient's functional ability including ADL's, fall risks, home safety risks and hearing or visual             impairment. 5. Diet and physical activities 6. Evidence for depression or mood disorders  The roster of all physicians providing medical care to patient - is listed in the CareTeams section of the chart.  End of life wishes discussed and updated in Social History.  Daughter in law died of cancer in September 25, 2013- she has been helping to raise her grand son.  She feels she is coping ok.  Enjoys being with her grandson.  Mammogram scheduled for today-  Last mammogram 05/28/13 H/o pos HPV- neg colposcopy on 03/26/11 (had post menopausal bleeding-none since) Has declined colonoscopy- willing to do stool cards- negative stool card 09/04/13  Has previously declined all vaccinations, including zostavax, pneumovax.  Uterine prolapse- getting worse.  Not much pressure but when she lifts heavy things at worse, "feels it coming out more." No issues with pelvic pain, does have some urinary incontinence.  No current outpatient prescriptions on file prior to visit.   No current facility-administered medications on file prior to visit.    No Known Allergies  Past Medical History  Diagnosis Date  . Unspecified disorder of bladder   . Uterine prolapse     Past Surgical History  Procedure Laterality Date  . Appendectomy  1990  . Bartholins cyst  1978    No family history on  file.  History   Social History  . Marital Status: Married    Spouse Name: N/A  . Number of Children: 1  . Years of Education: N/A   Occupational History  . Works in a [employer]  . Smoking status: Never Smoker   . Smokeless tobacco: Not on file  . Alcohol Use: No  . Drug Use: No  . Sexual Activity: Yes   Other Topics Concern  . Not on file   Social History Narrative   Does not have living will   Husband and son aware of wishes.   Pt would desires CPR but would not want prolonged life support if futile.   The PMH, PSH, Social History, Family History, Medications, and allergies have been reviewed in [facility], and have been updated if relevant.  Review of Systems  Constitutional: Negative.   HENT: Negative.   Eyes: Negative.   Respiratory: Negative.   Cardiovascular: Negative.   Gastrointestinal: Negative.   Endocrine: Negative.   Genitourinary: Negative for decreased urine volume, vaginal bleeding, vaginal discharge, vaginal pain and pelvic pain.  Musculoskeletal: Negative.   Skin: Negative.   Allergic/Immunologic: Negative.   Neurological: Negative.   Hematological: Negative.   Psychiatric/Behavioral: Negative.   All other systems reviewed and are negative.      Objective:    BP 132/70 mmHg  Pulse 53  Temp(Src) 97.8 F (36.6 C) (Oral)  Ht 5' 0.25" (1.53 m)  Wt 117 lb 4  oz (53.184 kg)  BMI 22.72 kg/m2  SpO2 98%  LMP 11/10/2012   Physical Exam    General:  Well-developed,well-nourished,in no acute distress; alert,appropriate and cooperative throughout examination Head:  normocephalic and atraumatic.   Eyes:  vision grossly intact, pupils equal, pupils round, and pupils reactive to light.   Ears:  R ear normal and L ear normal.   Nose:  no external deformity.   Mouth:  good dentition.   Neck:  No deformities, masses, or tenderness noted. Breasts:  No mass, nodules, thickening, tenderness, bulging, retraction, inflamation, nipple  discharge or skin changes noted.   Lungs:  Normal respiratory effort, chest expands symmetrically. Lungs are clear to auscultation, no crackles or wheezes. Heart:  Normal rate and regular rhythm. S1 and S2 normal without gallop, murmur, click, rub or other extra sounds. Abdomen:  Bowel sounds positive,abdomen soft and non-tender without masses, organomegaly or hernias noted. Rectal:  no external abnormalities.   Genitalia:  Pelvic Exam:        External: normal female genitalia without lesions or masses        Vagina: normal without lesions or masses        Cervix: normal without lesions or masses        Adnexa: normal bimanual exam without masses or fullness        Uterus: +prolapse        Pap smear: performed Msk:  No deformity or scoliosis noted of thoracic or lumbar spine.   Extremities:  No clubbing, cyanosis, edema, or deformity noted with normal full range of motion of all joints.   Neurologic:  alert & oriented X3 and gait normal.   Skin:  Intact without suspicious lesions or rashes Cervical Nodes:  No lymphadenopathy noted Axillary Nodes:  No palpable lymphadenopathy Psych:  Cognition and judgment appear intact. Alert and cooperative with normal attention span and concentration. No apparent delusions, illusions, hallucinations      Assessment & Plan:   Welcome to Medicare preventive visit - Plan: EKG 12-Lead No Follow-up on file.

## 2015-01-20 NOTE — Addendum Note (Signed)
Addended by: Modena Nunnery on: 01/20/2015 09:54 AM   Modules accepted: Orders

## 2015-01-21 LAB — CYTOLOGY - PAP

## 2015-01-22 MED ORDER — SIMVASTATIN 10 MG PO TABS: 10.0000 mg | ORAL_TABLET | Freq: Every day | ORAL | Status: DC

## 2015-01-22 NOTE — Addendum Note (Signed)
Addended by: Modena Nunnery on: 01/22/2015 04:33 PM   Modules accepted: Orders

## 2015-02-03 ENCOUNTER — Other Ambulatory Visit: Payer: Self-pay

## 2015-02-03 MED ORDER — ALPRAZOLAM 0.25 MG PO TABS: 0.2500 mg | ORAL_TABLET | Freq: Three times a day (TID) | ORAL | Status: AC | PRN

## 2015-02-03 NOTE — Telephone Encounter (Signed)
Rx called in to requested pharmacy 

## 2015-02-03 NOTE — Telephone Encounter (Signed)
Pt left v/m; pt was seen for annual exam on 01/20/15; pt forgot to ask for refill alprazolam; pt request cb when refilled. Alprazolam not refilled since 2013.Please advise.

## 2015-04-19 DIAGNOSIS — Z23 Encounter for immunization: Secondary | ICD-10-CM | POA: Diagnosis not present

## 2015-04-19 DIAGNOSIS — T07 Unspecified multiple injuries: Secondary | ICD-10-CM | POA: Diagnosis not present

## 2015-04-23 ENCOUNTER — Other Ambulatory Visit: Payer: Self-pay | Admitting: Family Medicine

## 2015-09-16 ENCOUNTER — Ambulatory Visit (INDEPENDENT_AMBULATORY_CARE_PROVIDER_SITE_OTHER): Payer: Commercial Managed Care - HMO | Admitting: Primary Care

## 2015-09-16 ENCOUNTER — Encounter: Payer: Self-pay | Admitting: Primary Care

## 2015-09-16 VITALS — BP 120/74 | HR 61 | Temp 97.9°F | Ht 60.25 in | Wt 117.0 lb

## 2015-09-16 DIAGNOSIS — R05 Cough: Secondary | ICD-10-CM | POA: Diagnosis not present

## 2015-09-16 DIAGNOSIS — R059 Cough, unspecified: Secondary | ICD-10-CM

## 2015-09-16 MED ORDER — HYDROCODONE-HOMATROPINE 5-1.5 MG/5ML PO SYRP: 5.0000 mL | ORAL_SOLUTION | Freq: Every evening | ORAL | Status: DC | PRN

## 2015-09-16 MED ORDER — DOXYCYCLINE HYCLATE 100 MG PO TABS
100.0000 mg | ORAL_TABLET | Freq: Two times a day (BID) | ORAL | Status: DC
Start: 2015-09-16 — End: 2015-12-23

## 2015-09-16 NOTE — Patient Instructions (Signed)
Start Doxycycline antibiotic. Take 1 tablet by mouth twice daily for 7 days.  You may take the Hycodan cough suppressant at bedtime as needed for cough and rest. Caution this medication contains codeine and will make you feel drowsy.  You may take Delsym for daytime cough.  Increase consumption of fluids and rest.  Please call if no improvement in symptoms in 3-4 days.  It was a pleasure meeting you!

## 2015-09-16 NOTE — Progress Notes (Signed)
   Subjective:    Patient ID: Heidi Mitchell, female    DOB: 1951/05/02, 65 y.o.   MRN: [id number]  HPI  [name] is a 65 year old female who presents today with a chief complaint of cough. She also reports headache and ear fullness. Her cough has been present for 9 days. Denies production to cough, fevers, nausea. Her cough is the most bothersome symptom today, which is worse at night. She's taken Delsym and ibuprofen OTC with minimal relief. Her husband was recently ill with similar symptoms, however, his cough has cleared up.  Review of Systems  Constitutional: Positive for fatigue. Negative for fever.  HENT:       Ear fullness  Respiratory: Positive for cough.   Neurological: Positive for headaches.       Past Medical History  Diagnosis Date  . Unspecified disorder of bladder   . Uterine prolapse     Social History   Social History  . Marital Status: Married    Spouse Name: N/A  . Number of Children: 1  . Years of Education: N/A   Occupational History  . Works in a [employer]  . Smoking status: Never Smoker   . Smokeless tobacco: Not on file  . Alcohol Use: No  . Drug Use: No  . Sexual Activity: Yes   Other Topics Concern  . Not on file   Social History Narrative   Does not have living will   Husband and son aware of wishes.   Pt would desires CPR but would not want prolonged life support if futile.    Past Surgical History  Procedure Laterality Date  . Appendectomy  1990  . Bartholins cyst  1978    No family history on file.  No Known Allergies  Current Outpatient Prescriptions on File Prior to Visit  Medication Sig Dispense Refill  . simvastatin (ZOCOR) 10 MG tablet TAKE ONE TABLET BY MOUTH AT BEDTIME (Patient not taking: Reported on 09/16/2015) 90 tablet 0   No current facility-administered medications on file prior to visit.    BP 120/74 mmHg  Pulse 61  Temp(Src) 97.9 F (36.6 C) (Oral)  Ht 5' 0.25" (1.53 m)  Wt  117 lb (53.071 kg)  BMI 22.67 kg/m2  SpO2 99%  LMP 11/10/2012    Objective:   Physical Exam  Constitutional: She appears well-nourished.  HENT:  Right Ear: Tympanic membrane and ear canal normal.  Left Ear: Tympanic membrane and ear canal normal.  Nose: Right sinus exhibits no maxillary sinus tenderness and no frontal sinus tenderness. Left sinus exhibits no maxillary sinus tenderness and no frontal sinus tenderness.  Mouth/Throat: Oropharynx is clear and moist.  Eyes: Conjunctivae are normal.  Neck: Neck supple.  Cardiovascular: Normal rate and regular rhythm.   Pulmonary/Chest: She has wheezes in the right upper field and the left upper field. She has rhonchi in the right upper field, the right lower field, the left upper field and the left lower field.  Tenderness to left lateral chest wall MSK region.  Skin: Skin is warm and dry.          Assessment & Plan:  Acute Bronchitis:  Cough x 9 days, no improvement on OTC's. Fatigue. Cough becoming worse. Lungs with moderate rhonchi throughout, mild wheezing to upper lobes. Suspect bacterial involvement at this point due to examination and presentation. Treat with doxycycline course and tessalon pearls for cough. Fluids, rest, return precautions provided.

## 2015-09-16 NOTE — Progress Notes (Signed)
Pre visit review using our clinic review tool, if applicable. No additional management support is needed unless otherwise documented below in the visit note. 

## 2015-10-30 ENCOUNTER — Telehealth: Payer: Self-pay

## 2015-10-30 DIAGNOSIS — N8110 Cystocele, unspecified: Secondary | ICD-10-CM

## 2015-10-30 NOTE — Telephone Encounter (Signed)
Pt was seen for annual in 01/2015 and pt was to see Dr Matthews,urogynecology at [facility] but pt did not see Dr Zigmund Daniel and does not want to go that far; pt saw Dr Joan Flores who has retired now about breakthru bleeding and prolapsed bladder. Pt request referral to a GYN in [place]. Pt request cb.

## 2015-10-30 NOTE — Telephone Encounter (Signed)
Referral placed.

## 2015-11-03 ENCOUNTER — Other Ambulatory Visit (INDEPENDENT_AMBULATORY_CARE_PROVIDER_SITE_OTHER): Payer: Commercial Managed Care - HMO

## 2015-11-03 DIAGNOSIS — Z1211 Encounter for screening for malignant neoplasm of colon: Secondary | ICD-10-CM | POA: Diagnosis not present

## 2015-11-03 LAB — FECAL OCCULT BLOOD, IMMUNOCHEMICAL: Fecal Occult Bld: NEGATIVE

## 2015-11-04 ENCOUNTER — Encounter: Payer: Self-pay | Admitting: *Deleted

## 2015-12-04 ENCOUNTER — Encounter: Payer: Self-pay | Admitting: Obstetrics and Gynecology

## 2015-12-22 DIAGNOSIS — H5200 Hypermetropia, unspecified eye: Secondary | ICD-10-CM | POA: Diagnosis not present

## 2015-12-22 DIAGNOSIS — H5210 Myopia, unspecified eye: Secondary | ICD-10-CM | POA: Diagnosis not present

## 2015-12-23 ENCOUNTER — Ambulatory Visit (INDEPENDENT_AMBULATORY_CARE_PROVIDER_SITE_OTHER): Payer: Commercial Managed Care - HMO | Admitting: Obstetrics and Gynecology

## 2015-12-23 ENCOUNTER — Encounter: Payer: Self-pay | Admitting: Obstetrics and Gynecology

## 2015-12-23 VITALS — BP 110/62 | HR 59 | Ht 61.0 in | Wt 115.3 lb

## 2015-12-23 DIAGNOSIS — R3915 Urgency of urination: Secondary | ICD-10-CM

## 2015-12-23 DIAGNOSIS — N950 Postmenopausal bleeding: Secondary | ICD-10-CM | POA: Diagnosis not present

## 2015-12-23 DIAGNOSIS — N814 Uterovaginal prolapse, unspecified: Secondary | ICD-10-CM

## 2015-12-23 DIAGNOSIS — N952 Postmenopausal atrophic vaginitis: Secondary | ICD-10-CM

## 2015-12-23 DIAGNOSIS — N393 Stress incontinence (female) (male): Secondary | ICD-10-CM

## 2015-12-23 NOTE — Progress Notes (Signed)
GYNECOLOGY CLINIC PROGRESS NOTE  Subjective:    Heidi Mitchell is a 65 y.o. female who presents for evaluation of a cystocele. Problem started 5 years ago ago. Symptoms include: prolapse of tissue with straining, urinary incontinence: mild with coughing or sneezing and discomfort: mild. Symptoms have gradually worsened.  Patient also with complaints of urgency without leakage.  Patient notes having history of pessary use ~ 3 years ago, however notes that she discontinued use due to mild discomfort and being tired of having to use Premarin cream.   Patient notes that she has also been having some vaginal spotting over the past several years.  Initially was intermittent, now notes daily spotting.  Reports having workup several years ago, however incomplete (patient notes that prior provider attempted endometrial biopsy but did not get much tissue).    Menstrual History: OB History    Gravida Para Term Preterm AB TAB SAB Ectopic Multiple Living   1 1 1       1       Menarche age: 36 Patient's last menstrual period was 11/10/2012.   Past Medical History  Diagnosis Date  . Unspecified disorder of bladder   . Uterine prolapse     Past Surgical History  Procedure Laterality Date  . Appendectomy  1990  . Bartholins cyst  1978    History reviewed. No pertinent family history.    Social History   Social History  . Marital Status: Married    Spouse Name: N/A  . Number of Children: 1  . Years of Education: N/A   Occupational History  . Works in a [employer]  . Smoking status: Never Smoker   . Smokeless tobacco: Not on file  . Alcohol Use: No  . Drug Use: No  . Sexual Activity: Yes    Birth Control/ Protection: None   Other Topics Concern  . Not on file   Social History Narrative   Does not have living will   Husband and son aware of wishes.   Pt would desires CPR but would not want prolonged life support if futile.    Current Outpatient  Prescriptions on File Prior to Visit  Medication Sig Dispense Refill  . simvastatin (ZOCOR) 10 MG tablet TAKE ONE TABLET BY MOUTH AT BEDTIME (Patient not taking: Reported on 09/16/2015) 90 tablet 0   No current facility-administered medications on file prior to visit.    No Known Allergies  Review of Systems Pertinent items noted in HPI and remainder of comprehensive ROS otherwise negative.   Objective:     BP 110/62 mmHg  Pulse 59  Ht 5\' 1"  (1.549 m)  Wt 115 lb 4.8 oz (52.3 kg)  BMI 21.80 kg/m2  LMP 11/10/2012 Pelvis:  External genitalia: normal general appearance and cervix visible past the introitus Urinary system: urethral meatus normal and bladder fullness present Vaginal: atrophic mucosa and cystocele present, Grade 1-2 Cervix: normal appearance and no lesions Adnexa: non palpable and nontender Uterus: normal single, nontender and Grade 3 prolapse Rectal: good sphincter tone and no masses     Assessment:   cystocele and uterine prolapse   Vaginal atrophy Urinary incontinence (mild, stress) Urinary urgency Postmenopausal bleeding   Plan:    Discussed cystoceles/rectoceles and management options with the patient. All questions answered. Discussed pessary vs surgical management.  Patient desires to attempt to resume pessary use.  Still has pessary at home.  Will insert.  Desires to use something other than  premarin cream for maintenance.  Given sample of Trimo-san gel, to use 2-3 times weekly   Will order pelvic ultrasound for PMB.  Likely secondary to prolapse, but need to r/o other causes (including malignancy).  Urinary incontinence and urgency may change with use of pessary.  To reassess after pessary use resumed.   RTC in 2-4 weeks.     Rubie Maid, MD [facility]

## 2015-12-24 NOTE — Addendum Note (Signed)
Addended by: Augusto Gamble on: 12/24/2015 05:18 PM   Modules accepted: Orders

## 2016-01-06 ENCOUNTER — Telehealth: Payer: Self-pay | Admitting: *Deleted

## 2016-01-06 NOTE — Telephone Encounter (Signed)
Patient called and states that at her last office visit Dr. Marcelline Mates stated that she needed to leave her pessary in and that she will take it out at her next visit. The patient wants to know if she needs to take it out herself since she has an U/S scheduled before the appt with Dr. Marcelline Mates or will Dr. Marcelline Mates take it out after the U/S.  Patient is requesting a call back . Call back number [phone]

## 2016-01-06 NOTE — Telephone Encounter (Signed)
Called pt advised her that she will need to remove pessary before ultrasound. Pt gave verbal understanding.

## 2016-01-16 ENCOUNTER — Other Ambulatory Visit: Payer: Self-pay | Admitting: Obstetrics and Gynecology

## 2016-01-16 DIAGNOSIS — N950 Postmenopausal bleeding: Secondary | ICD-10-CM

## 2016-01-20 ENCOUNTER — Ambulatory Visit (INDEPENDENT_AMBULATORY_CARE_PROVIDER_SITE_OTHER): Payer: Commercial Managed Care - HMO

## 2016-01-20 ENCOUNTER — Ambulatory Visit (INDEPENDENT_AMBULATORY_CARE_PROVIDER_SITE_OTHER): Payer: Commercial Managed Care - HMO | Admitting: Obstetrics and Gynecology

## 2016-01-20 ENCOUNTER — Encounter: Payer: Self-pay | Admitting: Obstetrics and Gynecology

## 2016-01-20 VITALS — BP 122/54 | HR 49 | Ht 61.0 in | Wt 114.6 lb

## 2016-01-20 DIAGNOSIS — N950 Postmenopausal bleeding: Secondary | ICD-10-CM | POA: Diagnosis not present

## 2016-01-20 DIAGNOSIS — N952 Postmenopausal atrophic vaginitis: Secondary | ICD-10-CM | POA: Diagnosis not present

## 2016-01-20 DIAGNOSIS — N814 Uterovaginal prolapse, unspecified: Secondary | ICD-10-CM

## 2016-01-20 DIAGNOSIS — Z4689 Encounter for fitting and adjustment of other specified devices: Secondary | ICD-10-CM

## 2016-01-20 NOTE — Progress Notes (Signed)
    GYNECOLOGY PROGRESS NOTE  Subjective:    Patient ID: Heidi Mitchell, female    DOB: [birth year], 65 y.o.   MRN: [id number]  HPI  Patient is a 65 y.o. [id number] female who presents for f/u after ultrasound for PMB and pessary re-assessment. Patient notes that she has been only removing the pessary weekly. Still notes episodes of light bleeding especially with strenuous activity.  Is using Trimo-san gel weekly. Is still noting occasional episodes of urinary leakage (mostly with activity or coughing/sneezing).   The following portions of the patient's history were reviewed and updated as appropriate: allergies, current medications, past family history, past medical history, past social history, past surgical history and problem list.  Review of Systems Pertinent items noted in HPI and remainder of comprehensive ROS otherwise negative.   Objective:   Blood pressure 122/54, pulse 49, height 5\' 1"  (1.549 m), weight 114 lb 9.6 oz (51.982 kg), last menstrual period 11/10/2012. General appearance: alert and no distress Abdomen: soft, non-tender; bowel sounds normal; no masses,  no organomegaly Pelvic: External genitalia: normal general appearance and cervix visible past the introitus.    Vaginal: atrophic mucosa and cystocele present, Grade 2  Cervix: normal appearance and no lesions  Adnexa: non palpable and nontender  Uterus: normal single, nontender and Grade 3 prolapse Extremities: extremities normal, atraumatic, no cyanosis or edema Neurologic: Grossly normal     Imaging (Pelvic Ultrasound 01/20/2016):   Indications:PMB Findings:  The uterus measures 5.4 x 2 x 3.2. Echo texture is homogenous without evidence of focal masses. The Endometrium measures 1.3 mm.  Right Ovary measures 1.3 x 1.1 x 1.4 cm. It is normal in appearance. Left Ovary measures 1.6 x 1 x 1.3 cm. It is normal appearance. Survey of the adnexa demonstrates no adnexal masses. There is no free fluid in the cul de  sac.  Impression: 1. WNL  Recommendations: 1.Clinical correlation with the patient's History and Physical Exam.  Assessment:   Postmenopausal bleeding  Vaginal atrophy Cystocele with uterine prolapse Mild stress incontinence  Plan:   Discussed ultrasound with patient. Endometrial stripe wnl, no uterine masses noted.  Likely cause of bleeding is vaginal and/or endometrial atrophy.  Discussed that treatment is not required, but if bothersome could consider estrogen cream.  Patient declines.  Will continue using Trimo-san gel for pessary maintenance.Advised to increase to twice weekly.  Pessary resizing performed today. Patient previously with Size 3 ring with support.  Fitted today for size 3 incontinence dish with knob.  Will have patient return for insertion once pessary is ordered and arrives. Will help to treat incontinence symptoms as well as prolapse.   Will notify patient by phone when pessary arrives, can then schedule appointment.    Rubie Maid, MD [facility]

## 2016-01-22 ENCOUNTER — Ambulatory Visit: Payer: Commercial Managed Care - HMO | Admitting: Obstetrics and Gynecology

## 2016-03-04 ENCOUNTER — Ambulatory Visit (INDEPENDENT_AMBULATORY_CARE_PROVIDER_SITE_OTHER): Payer: Commercial Managed Care - HMO | Admitting: Obstetrics and Gynecology

## 2016-03-11 ENCOUNTER — Encounter: Payer: Self-pay | Admitting: Internal Medicine

## 2016-03-11 ENCOUNTER — Ambulatory Visit (INDEPENDENT_AMBULATORY_CARE_PROVIDER_SITE_OTHER): Payer: Commercial Managed Care - HMO | Admitting: Internal Medicine

## 2016-03-11 VITALS — BP 112/68 | HR 56 | Temp 98.7°F | Wt 113.0 lb

## 2016-03-11 DIAGNOSIS — R3915 Urgency of urination: Secondary | ICD-10-CM

## 2016-03-11 DIAGNOSIS — R300 Dysuria: Secondary | ICD-10-CM

## 2016-03-11 DIAGNOSIS — N3289 Other specified disorders of bladder: Secondary | ICD-10-CM

## 2016-03-11 DIAGNOSIS — R350 Frequency of micturition: Secondary | ICD-10-CM

## 2016-03-11 DIAGNOSIS — R3989 Other symptoms and signs involving the genitourinary system: Secondary | ICD-10-CM

## 2016-03-11 MED ORDER — NITROFURANTOIN MONOHYD MACRO 100 MG PO CAPS: 100.0000 mg | ORAL_CAPSULE | Freq: Two times a day (BID) | ORAL | Status: DC

## 2016-03-11 NOTE — Patient Instructions (Signed)

## 2016-03-11 NOTE — Addendum Note (Signed)
Addended by: Lurlean Nanny on: 03/11/2016 04:58 PM   Modules accepted: Orders, SmartSet

## 2016-03-11 NOTE — Progress Notes (Signed)
HPI  Pt presents to the clinic today with c/o urinary urgency, frequency and dysuria. This started 4 days ago. She does have some associated bladder pressure. She denies fever, chills, nausea or low back pain. She denies vaginal issues. She has not tried anything OTC. She does a have a history of prolapsed bladder and wears a pessary.   Review of Systems  Past Medical History  Diagnosis Date  . Unspecified disorder of bladder   . Uterine prolapse     No family history on file.  Social History   Social History  . Marital Status: Married    Spouse Name: N/A  . Number of Children: 1  . Years of Education: N/A   Occupational History  . Works in a [employer]  . Smoking status: Never Smoker   . Smokeless tobacco: Not on file  . Alcohol Use: No  . Drug Use: No  . Sexual Activity: Yes    Birth Control/ Protection: None   Other Topics Concern  . Not on file   Social History Narrative   Does not have living will   Husband and son aware of wishes.   Pt would desires CPR but would not want prolonged life support if futile.    No Known Allergies  Constitutional: Denies fever, malaise, fatigue, headache or abrupt weight changes.   GU: Pt reports urgency, frequency and pain with urination. Denies burning sensation, blood in urine, odor or discharge. Skin: Denies redness, rashes, lesions or ulcercations.   No other specific complaints in a complete review of systems (except as listed in HPI above).    Objective:   Physical Exam  BP 112/68 mmHg  Pulse 56  Temp(Src) 98.7 F (37.1 C) (Oral)  Wt 113 lb (51.256 kg)  SpO2 98%  LMP 11/10/2012 Wt Readings from Last 3 Encounters:  03/11/16 113 lb (51.256 kg)  01/20/16 114 lb 9.6 oz (51.982 kg)  12/23/15 115 lb 4.8 oz (52.3 kg)    General: Appears her stated age, well developed, well nourished in NAD. Cardiovascular: Normal rate and rhythm. S1,S2 noted.   Pulmonary/Chest: Normal effort and positive  vesicular breath sounds. No respiratory distress. No wheezes, rales or ronchi noted.  Abdomen: Soft. Normal bowel sounds. No distention or masses noted.  Tender to palpation over the bladder area. No CVA tenderness.      Assessment & Plan:   Urgency, Frequency, Dysuria, Bladder Pressure secondary to UTI  Urinalysis: 3+ leuks, 1+ blood Will send urine culture eRx sent if for Macrobid 100 mg BID x 5 days OK to take AZO OTC Drink plenty of fluids  RTC as needed or if symptoms persist. Webb Silversmith, NP

## 2016-03-11 NOTE — Progress Notes (Signed)
Pre visit review using our clinic review tool, if applicable. No additional management support is needed unless otherwise documented below in the visit note. 

## 2016-03-12 ENCOUNTER — Ambulatory Visit: Payer: Commercial Managed Care - HMO

## 2016-03-12 LAB — POC URINALSYSI DIPSTICK (AUTOMATED)
Bilirubin, UA: NEGATIVE
Glucose, UA: NEGATIVE
Ketones, UA: NEGATIVE
NITRITE UA: NEGATIVE
PH UA: 6
Spec Grav, UA: 1.025
UROBILINOGEN UA: 0.2

## 2016-03-12 NOTE — Addendum Note (Signed)
Addended by: Lurlean Nanny on: 03/12/2016 04:23 PM   Modules accepted: Miquel Dunn

## 2016-03-13 LAB — URINE CULTURE

## 2016-03-19 ENCOUNTER — Ambulatory Visit (INDEPENDENT_AMBULATORY_CARE_PROVIDER_SITE_OTHER): Payer: Commercial Managed Care - HMO

## 2016-03-19 VITALS — BP 100/60 | HR 54 | Temp 98.3°F | Ht 60.0 in | Wt 112.2 lb

## 2016-03-19 DIAGNOSIS — Z0000 Encounter for general adult medical examination without abnormal findings: Secondary | ICD-10-CM | POA: Diagnosis not present

## 2016-03-19 DIAGNOSIS — Z1159 Encounter for screening for other viral diseases: Secondary | ICD-10-CM | POA: Diagnosis not present

## 2016-03-19 DIAGNOSIS — E785 Hyperlipidemia, unspecified: Secondary | ICD-10-CM

## 2016-03-19 LAB — COMPREHENSIVE METABOLIC PANEL
ALT: 17 U/L (ref 0–35)
AST: 24 U/L (ref 0–37)
Albumin: 4.3 g/dL (ref 3.5–5.2)
Alkaline Phosphatase: 52 U/L (ref 39–117)
BUN: 20 mg/dL (ref 6–23)
CALCIUM: 9.6 mg/dL (ref 8.4–10.5)
CHLORIDE: 102 meq/L (ref 96–112)
CO2: 29 meq/L (ref 19–32)
Creatinine, Ser: 0.62 mg/dL (ref 0.40–1.20)
GFR: 102.18 mL/min (ref >60.00)
Glucose, Bld: 88 mg/dL (ref 70–99)
Potassium: 4.1 mEq/L (ref 3.5–5.1)
Sodium: 136 mEq/L (ref 135–145)
Total Bilirubin: 1.1 mg/dL (ref 0.2–1.2)
Total Protein: 7 g/dL (ref 6.0–8.3)

## 2016-03-19 LAB — LIPID PANEL
CHOL/HDL RATIO: 4
Cholesterol: 245 mg/dL — ABNORMAL HIGH (ref 0–200)
HDL: 64.2 mg/dL (ref >39.00)
LDL CALC: 167 mg/dL — AB (ref 0–99)
NonHDL: 180.63
Triglycerides: 67 mg/dL (ref 0.0–149.0)
VLDL: 13.4 mg/dL (ref 0.0–40.0)

## 2016-03-19 LAB — CBC WITH DIFFERENTIAL/PLATELET
BASOS PCT: 0.6 % (ref 0.0–3.0)
Basophils Absolute: 0.1 10*3/uL (ref 0.0–0.1)
Eosinophils Absolute: 0.1 10*3/uL (ref 0.0–0.7)
Eosinophils Relative: 0.8 % (ref 0.0–5.0)
HEMATOCRIT: 37.7 % (ref 36.0–46.0)
HEMOGLOBIN: 12.5 g/dL (ref 12.0–15.0)
LYMPHS PCT: 21.1 % (ref 12.0–46.0)
Lymphs Abs: 1.9 10*3/uL (ref 0.7–4.0)
MCHC: 33.1 g/dL (ref 30.0–36.0)
MCV: 84.4 fl (ref 78.0–100.0)
MONOS PCT: 8.4 % (ref 3.0–12.0)
Monocytes Absolute: 0.7 10*3/uL (ref 0.1–1.0)
NEUTROS ABS: 6.2 10*3/uL (ref 1.4–7.7)
Neutrophils Relative %: 69.1 % (ref 43.0–77.0)
PLATELETS: 242 10*3/uL (ref 150.0–400.0)
RBC: 4.47 Mil/uL (ref 3.87–5.11)
RDW: 15.7 % — AB (ref 11.5–15.5)
WBC: 8.9 10*3/uL (ref 4.0–10.5)

## 2016-03-19 LAB — TSH: TSH: 1.54 u[IU]/mL (ref 0.35–4.50)

## 2016-03-19 NOTE — Patient Instructions (Signed)
Heidi Mitchell , Thank you for taking time to come for your Medicare Wellness Visit. I appreciate your ongoing commitment to your health goals. Please review the following plan we discussed and let me know if I can assist you in the future.   These are the goals we discussed: Goals    . Increase water intake     Starting 03/19/2016, I will attempt to drink at least 6-8 glasses of water daily.        This is a list of the screening recommended for you and due dates:  Health Maintenance  Topic Date Due  . DEXA scan (bone density measurement)  03/19/2017*  . Colon Cancer Screening  03/19/2024*  . Shingles Vaccine  03/19/2024*  . Pneumonia vaccines (1 of 2 - PCV13) 03/19/2024*  . Flu Shot  04/13/2016  . Stool Blood Test  11/02/2016  . Mammogram  01/19/2017  . Tetanus Vaccine  02/25/2025  .  Hepatitis C: One time screening is recommended by Center for Disease Control  (CDC) for  adults born from [birth year] through 1967.   Completed  *Topic was postponed. The date shown is not the original due date.    Preventive Care for Adults  A healthy lifestyle and preventive care can promote health and wellness. Preventive health guidelines for adults include the following key practices.  . A routine yearly physical is a good way to check with your health care provider about your health and preventive screening. It is a chance to share any concerns and updates on your health and to receive a thorough exam.  . Visit your dentist for a routine exam and preventive care every 6 months. Brush your teeth twice a day and floss once a day. Good oral hygiene prevents tooth decay and gum disease.  . The frequency of eye exams is based on your age, health, family medical history, use  of contact lenses, and other factors. Follow your health care provider's ecommendations for frequency of eye exams.  . Eat a healthy diet. Foods like vegetables, fruits, whole grains, low-fat dairy products, and lean protein foods  contain the nutrients you need without too many calories. Decrease your intake of foods high in solid fats, added sugars, and salt. Eat the right amount of calories for you. Get information about a proper diet from your health care provider, if necessary.  . Regular physical exercise is one of the most important things you can do for your health. Most adults should get at least 150 minutes of moderate-intensity exercise (any activity that increases your heart rate and causes you to sweat) each week. In addition, most adults need muscle-strengthening exercises on 2 or more days a week.  Silver Sneakers may be a benefit available to you. To determine eligibility, you may visit the website: www.silversneakers.com or contact program at 787-518-4159 Mon-Fri between 8AM-8PM.   . Maintain a healthy weight. The body mass index (BMI) is a screening tool to identify possible weight problems. It provides an estimate of body fat based on height and weight. Your health care provider can find your BMI and can help you achieve or maintain a healthy weight.   For adults 20 years and older: ? A BMI below 18.5 is considered underweight. ? A BMI of 18.5 to 24.9 is normal. ? A BMI of 25 to 29.9 is considered overweight. ? A BMI of 30 and above is considered obese.   . Maintain normal blood lipids and cholesterol levels by exercising and minimizing your  intake of saturated fat. Eat a balanced diet with plenty of fruit and vegetables. Blood tests for lipids and cholesterol should begin at age 16 and be repeated every 5 years. If your lipid or cholesterol levels are high, you are over 50, or you are at high risk for heart disease, you may need your cholesterol levels checked more frequently. Ongoing high lipid and cholesterol levels should be treated with medicines if diet and exercise are not working.  . If you smoke, find out from your health care provider how to quit. If you do not use tobacco, please do not  start.  . If you choose to drink alcohol, please do not consume more than 2 drinks per day. One drink is considered to be 12 ounces (355 mL) of beer, 5 ounces (148 mL) of wine, or 1.5 ounces (44 mL) of liquor.  . If you are 68-49 years old, ask your health care provider if you should take aspirin to prevent strokes.  . Use sunscreen. Apply sunscreen liberally and repeatedly throughout the day. You should seek shade when your shadow is shorter than you. Protect yourself by wearing long sleeves, pants, a wide-brimmed hat, and sunglasses year round, whenever you are outdoors.  . Once a month, do a whole body skin exam, using a mirror to look at the skin on your back. Tell your health care provider of new moles, moles that have irregular borders, moles that are larger than a pencil eraser, or moles that have changed in shape or color.

## 2016-03-19 NOTE — Progress Notes (Signed)
PCP notes:  Health maintenance:   Hep C screening - completed Colonoscopy - modified screening type to FOBT Shingles - declined PCV 13 - declined Bone density - declined Tetanus - declined  Abnormal screenings:  Hearing - failed  Patient concerns: None  Nurse concerns: None  Next PCP appt: 03/29/2016 @ 1545

## 2016-03-19 NOTE — Progress Notes (Signed)
Subjective:   Heidi Mitchell is a 65 y.o. female who presents for Medicare Annual (Subsequent) preventive examination.  Review of Systems:  N/A Cardiac Risk Factors include: advanced age (>53men, >59 women);dyslipidemia     Objective:     Vitals: BP 100/60 mmHg  Pulse 54  Temp(Src) 98.3 F (36.8 C) (Oral)  Ht 5' (1.524 m)  Wt 112 lb 4 oz (50.916 kg)  BMI 21.92 kg/m2  SpO2 97%  LMP 11/10/2012  Body mass index is 21.92 kg/(m^2).   Tobacco History  Smoking status  . Never Smoker   Smokeless tobacco  . Not on file     Counseling given: No   Past Medical History  Diagnosis Date  . Unspecified disorder of bladder   . Uterine prolapse    Past Surgical History  Procedure Laterality Date  . Appendectomy  1990  . Bartholins cyst  1978   History reviewed. No pertinent family history. History  Sexual Activity  . Sexual Activity: No    Outpatient Encounter Prescriptions as of 03/19/2016  Medication Sig  . [DISCONTINUED] nitrofurantoin, macrocrystal-monohydrate, (MACROBID) 100 MG capsule Take 1 capsule (100 mg total) by mouth 2 (two) times daily.   No facility-administered encounter medications on file as of 03/19/2016.    Activities of Daily Living In your present state of health, do you have any difficulty performing the following activities: 03/19/2016  Hearing? N  Vision? N  Difficulty concentrating or making decisions? N  Walking or climbing stairs? N  Dressing or bathing? N  Doing errands, shopping? N  Preparing Food and eating ? N  Using the Toilet? N  In the past six months, have you accidently leaked urine? N  Do you have problems with loss of bowel control? N  Managing your Medications? N  Managing your Finances? N  Housekeeping or managing your Housekeeping? N    Patient Care Team: Lucille Passy, MD as PCP - [place] as Consulting Physician Rubie Maid, MD as Referring Physician (Obstetrics and Gynecology)    Assessment:     Hearing Screening   125Hz  250Hz  500Hz  1000Hz  2000Hz  4000Hz  8000Hz   Right ear:   40 0 40 0   Left ear:   0 0 40 0   Vision Screening Comments: Last vision exam on December 22, 2015   Exercise Activities and Dietary recommendations Current Exercise Habits: The patient does not participate in regular exercise at present, Exercise limited by: None identified  Goals    . Increase water intake     Starting 03/19/2016, I will attempt to drink at least 6-8 glasses of water daily.       Fall Risk Fall Risk  03/19/2016  Falls in the past year? No   Depression Screen PHQ 2/9 Scores 03/19/2016  PHQ - 2 Score 0     Cognitive Testing MMSE - Mini Mental State Exam 03/19/2016  Orientation to time 5  Orientation to Place 5  Registration 3  Attention/ Calculation 0  Recall 3  Language- name 2 objects 0  Language- repeat 1  Language- follow 3 step command 3  Language- read & follow direction 0  Write a sentence 0  Copy design 0  Total score 20   PLEASE NOTE: A Mini-Cog screen was completed. Maximum score is 20. A value of 0 denotes this part of [place] MMSE was not completed or the patient failed this part of the Mini-Cog screening.   Mini-Cog Screening Orientation to Time - Max 5  pts Orientation to Place - Max 5 pts Registration - Max 3 pts Recall - Max 3 pts Language Repeat - Max 1 pts Language Follow 3 Step Command - Max 3 pts  Immunization History  Administered Date(s) Administered  . Td 01/18/2002   Screening Tests Health Maintenance  Topic Date Due  . DEXA SCAN  03/19/2017 (Originally 08/21/2014)  . COLONOSCOPY  03/19/2024 (Originally 08/22/1999)  . ZOSTAVAX  03/19/2024 (Originally 08/21/2009)  . PNA vac Low Risk Adult (1 of 2 - PCV13) 03/19/2024 (Originally 08/21/2014)  . INFLUENZA VACCINE  04/13/2016  . COLON CANCER SCREENING ANNUAL FOBT  11/02/2016  . MAMMOGRAM  01/19/2017  . TETANUS/TDAP  02/25/2025  . Hepatitis C Screening  Completed      Plan:     I have personally  reviewed and addressed the Medicare Annual Wellness questionnaire and have noted the following in the patient's chart:  A. Medical and social history B. Use of alcohol, tobacco or illicit drugs  C. Current medications and supplements D. Functional ability and status E.  Nutritional status F.  Physical activity G. Advance directives H. List of other physicians I.  Hospitalizations, surgeries, and ER visits in previous 12 months J.  [place] to include hearing, vision, cognitive, depression L. Referrals and appointments - none  In addition, I have reviewed and discussed with patient certain preventive protocols, quality metrics, and best practice recommendations. A written personalized care plan for preventive services as well as general preventive health recommendations were provided to patient.  See attached scanned questionnaire for additional information.   Signed,   Lindell Noe, MHA, BS, LPN Health Advisor

## 2016-03-19 NOTE — Progress Notes (Signed)
I reviewed health advisor's note, was available for consultation, and agree with documentation and plan.  Andreia Gandolfi, MD [facility] [place] [place]  

## 2016-03-19 NOTE — Progress Notes (Signed)
Pre visit review using our clinic review tool, if applicable. No additional management support is needed unless otherwise documented below in the visit note. 

## 2016-03-20 LAB — HEPATITIS C ANTIBODY: HCV Ab: NEGATIVE

## 2016-03-24 ENCOUNTER — Telehealth: Payer: Self-pay

## 2016-03-24 NOTE — Telephone Encounter (Signed)
Pt called and left a message complaining of continued UTI symptoms. Pt was seen 03-11-16 for UTI. She said she finished the antibiotics but still has UTI symptoms. She is asking for a new (stronger) antibiotic to be called in.

## 2016-03-24 NOTE — Telephone Encounter (Signed)
She needs to come in so we can recheck her urine

## 2016-03-25 NOTE — Telephone Encounter (Signed)
Pt has appt for 03/26/2016

## 2016-03-26 ENCOUNTER — Ambulatory Visit (INDEPENDENT_AMBULATORY_CARE_PROVIDER_SITE_OTHER): Payer: Commercial Managed Care - HMO | Admitting: Internal Medicine

## 2016-03-26 ENCOUNTER — Encounter: Payer: Self-pay | Admitting: Internal Medicine

## 2016-03-26 VITALS — BP 104/66 | HR 52 | Temp 98.0°F | Wt 113.8 lb

## 2016-03-26 DIAGNOSIS — N3289 Other specified disorders of bladder: Secondary | ICD-10-CM | POA: Diagnosis not present

## 2016-03-26 DIAGNOSIS — R3915 Urgency of urination: Secondary | ICD-10-CM | POA: Diagnosis not present

## 2016-03-26 DIAGNOSIS — N390 Urinary tract infection, site not specified: Secondary | ICD-10-CM

## 2016-03-26 DIAGNOSIS — R3989 Other symptoms and signs involving the genitourinary system: Secondary | ICD-10-CM

## 2016-03-26 DIAGNOSIS — R300 Dysuria: Secondary | ICD-10-CM | POA: Diagnosis not present

## 2016-03-26 DIAGNOSIS — R350 Frequency of micturition: Secondary | ICD-10-CM

## 2016-03-26 LAB — POC URINALSYSI DIPSTICK (AUTOMATED)
Bilirubin, UA: NEGATIVE
Glucose, UA: NEGATIVE
Ketones, UA: NEGATIVE
NITRITE UA: NEGATIVE
PROTEIN UA: NEGATIVE
SPEC GRAV UA: 1.015
Urobilinogen, UA: NEGATIVE
pH, UA: 6

## 2016-03-26 MED ORDER — CIPROFLOXACIN HCL 500 MG PO TABS: 500.0000 mg | ORAL_TABLET | Freq: Two times a day (BID) | ORAL | Status: DC

## 2016-03-26 NOTE — Patient Instructions (Signed)

## 2016-03-26 NOTE — Progress Notes (Signed)
HPI  Pt presents to the clinic today to follow up urinary urgency, frequency and dysuria. She does have some associated bladder pressure. She was seen 6/29 for the same. Her urine culture grew out E Coli. She was treated with 5 days of Macrobid. She reports her symptoms improved but never went away. She denies fever, chills, nausea or low back pain. She denies vaginal issues. She has not tried anything OTC. She does a have a history of prolapsed bladder and wears a pessary.   Review of Systems  Past Medical History  Diagnosis Date  . Unspecified disorder of bladder   . Uterine prolapse     History reviewed. No pertinent family history.  Social History   Social History  . Marital Status: Married    Spouse Name: N/A  . Number of Children: 1  . Years of Education: N/A   Occupational History  . Works in a [employer]  . Smoking status: Never Smoker   . Smokeless tobacco: Not on file  . Alcohol Use: No  . Drug Use: No  . Sexual Activity: No   Other Topics Concern  . Not on file   Social History Narrative   Does not have living will   Husband and son aware of wishes.   Pt would desires CPR but would not want prolonged life support if futile.    No Known Allergies  Constitutional: Denies fever, malaise, fatigue, headache or abrupt weight changes.   GU: Pt reports urgency, frequency and pain with urination. Denies burning sensation, blood in urine, odor or discharge. Skin: Denies redness, rashes, lesions or ulcercations.   No other specific complaints in a complete review of systems (except as listed in HPI above).    Objective:   Physical Exam  BP 104/66 mmHg  Pulse 52  Temp(Src) 98 F (36.7 C) (Oral)  Wt 113 lb 12 oz (51.597 kg)  SpO2 98%  LMP 11/10/2012 Wt Readings from Last 3 Encounters:  03/26/16 113 lb 12 oz (51.597 kg)  03/19/16 112 lb 4 oz (50.916 kg)  03/11/16 113 lb (51.256 kg)    General: Appears her stated age, well  developed, well nourished in NAD. Cardiovascular: Normal rate and rhythm. S1,S2 noted.   Pulmonary/Chest: Normal effort and positive vesicular breath sounds. No respiratory distress. No wheezes, rales or ronchi noted.  Abdomen: Soft. Normal bowel sounds. No distention or masses noted.  Tender to palpation over the bladder area. No CVA tenderness.      Assessment & Plan:   Urgency, Frequency, Dysuria, Bladder Pressure secondary to  Unresolved UTI  Urinalysis: 3+ leuks, 1+ blood Will send urine culture eRx sent if for Cipro 500 mg BID x 7 days OK to take AZO OTC Drink plenty of fluids  RTC as needed or if symptoms persist. Webb Silversmith, NP

## 2016-03-26 NOTE — Progress Notes (Signed)
Pre visit review using our clinic review tool, if applicable. No additional management support is needed unless otherwise documented below in the visit note. 

## 2016-03-26 NOTE — Addendum Note (Signed)
Addended by: Lurlean Nanny on: 03/26/2016 04:35 PM   Modules accepted: Orders, SmartSet

## 2016-03-28 LAB — URINE CULTURE: Colony Count: >=100000

## 2016-03-29 ENCOUNTER — Telehealth: Payer: Self-pay | Admitting: Family Medicine

## 2016-03-29 ENCOUNTER — Ambulatory Visit (INDEPENDENT_AMBULATORY_CARE_PROVIDER_SITE_OTHER): Payer: Commercial Managed Care - HMO | Admitting: Family Medicine

## 2016-03-29 ENCOUNTER — Other Ambulatory Visit: Payer: Self-pay | Admitting: Family Medicine

## 2016-03-29 ENCOUNTER — Encounter: Payer: Self-pay | Admitting: Family Medicine

## 2016-03-29 VITALS — BP 112/64 | HR 51 | Temp 97.9°F | Ht 60.0 in | Wt 113.8 lb

## 2016-03-29 DIAGNOSIS — Z0000 Encounter for general adult medical examination without abnormal findings: Secondary | ICD-10-CM | POA: Diagnosis not present

## 2016-03-29 DIAGNOSIS — L600 Ingrowing nail: Secondary | ICD-10-CM

## 2016-03-29 DIAGNOSIS — E785 Hyperlipidemia, unspecified: Secondary | ICD-10-CM | POA: Diagnosis not present

## 2016-03-29 DIAGNOSIS — Z01419 Encounter for gynecological examination (general) (routine) without abnormal findings: Secondary | ICD-10-CM

## 2016-03-29 MED ORDER — PRAVASTATIN SODIUM 10 MG PO TABS: 10.0000 mg | ORAL_TABLET | Freq: Every day | ORAL | Status: DC

## 2016-03-29 NOTE — Assessment & Plan Note (Signed)
Reviewed preventive care protocols, scheduled due services, and updated immunizations Discussed nutrition, exercise, diet, and healthy lifestyle.  

## 2016-03-29 NOTE — Telephone Encounter (Signed)
No its ok to go ahead and start the cholesterol medication.

## 2016-03-29 NOTE — Progress Notes (Signed)
Pre visit review using our clinic review tool, if applicable. No additional management support is needed unless otherwise documented below in the visit note. 

## 2016-03-29 NOTE — Progress Notes (Signed)
Subjective:   Patient ID: Heidi Mitchell, female    DOB: 1951-05-23, 65 y.o.   MRN: [id number]  Heidi Mitchell is a pleasant 65 y.o. year old female who presents to clinic today with Annual Exam  and follow up of chronic medical conditions on 03/29/2016  HPI:  Annual medicare wellness visit with Candis Musa, RN on 03/19/16- notes reviewed.  H/o pos HPV- neg colposcopy on 03/26/11 (had post menopausal bleeding-none since) Last pap smear done by me on 01/20/15- negative.  Continues to decline all vaccinations, including zostavax, pneumovax.  HLD- stopped taking zocor 10 mg daily and LDL is very high. Had myalgias with zocor.  Lab Results  Component Value Date   CHOL 245* 03/19/2016   HDL 64.20 03/19/2016   LDLCALC 167* 03/19/2016   LDLDIRECT 166.5 03/31/2012   TRIG 67.0 03/19/2016   CHOLHDL 4 03/19/2016   Lab Results  Component Value Date   ALT 17 03/19/2016   AST 24 03/19/2016   ALKPHOS 52 03/19/2016   BILITOT 1.1 03/19/2016   Lab Results  Component Value Date   WBC 8.9 03/19/2016   HGB 12.5 03/19/2016   HCT 37.7 03/19/2016   MCV 84.4 03/19/2016   PLT 242.0 03/19/2016   Lab Results  Component Value Date   TSH 1.54 03/19/2016   Lab Results  Component Value Date   NA 136 03/19/2016   K 4.1 03/19/2016   CL 102 03/19/2016   CO2 29 03/19/2016     Current Outpatient Prescriptions on File Prior to Visit  Medication Sig Dispense Refill  . ciprofloxacin (CIPRO) 500 MG tablet Take 1 tablet (500 mg total) by mouth 2 (two) times daily. 14 tablet 0   No current facility-administered medications on file prior to visit.    No Known Allergies  Past Medical History  Diagnosis Date  . Unspecified disorder of bladder   . Uterine prolapse     Past Surgical History  Procedure Laterality Date  . Appendectomy  1990  . Bartholins cyst  1978    No family history on file.  Social History   Social History  . Marital Status: Married    Spouse Name: N/A  .  Number of Children: 1  . Years of Education: N/A   Occupational History  . Works in a [employer]  . Smoking status: Never Smoker   . Smokeless tobacco: Not on file  . Alcohol Use: No  . Drug Use: No  . Sexual Activity: No   Other Topics Concern  . Not on file   Social History Narrative   Does not have living will   Husband and son aware of wishes.   Pt would desires CPR but would not want prolonged life support if futile.   The PMH, PSH, Social History, Family History, Medications, and allergies have been reviewed in [facility], and have been updated if relevant.  Review of Systems  Constitutional: Negative.   HENT: Negative.   Eyes: Negative.   Respiratory: Negative.   Cardiovascular: Negative.   Gastrointestinal: Negative.   Endocrine: Negative.   Genitourinary: Negative for decreased urine volume, vaginal bleeding, vaginal discharge, vaginal pain and pelvic pain.  Musculoskeletal: Negative.   Skin: Negative.   Allergic/Immunologic: Negative.   Neurological: Negative.   Hematological: Negative.   Psychiatric/Behavioral: Negative.   All other systems reviewed and are negative.      Objective:    BP 112/64 mmHg  Pulse 51  Temp(Src) 97.9  F (36.6 C) (Oral)  Ht 5' (1.524 m)  Wt 113 lb 12 oz (51.597 kg)  BMI 22.22 kg/m2  SpO2 99%  LMP 11/10/2012   Physical Exam    General:  Well-developed,well-nourished,in no acute distress; alert,appropriate and cooperative throughout examination Head:  normocephalic and atraumatic.   Eyes:  vision grossly intact, pupils equal, pupils round, and pupils reactive to light.   Ears:  R ear normal and L ear normal.   Nose:  no external deformity.   Mouth:  good dentition.   Neck:  No deformities, masses, or tenderness noted. Breasts:  No mass, nodules, thickening, tenderness, bulging, retraction, inflamation, nipple discharge or skin changes noted.   Lungs:  Normal respiratory effort, chest expands  symmetrically. Lungs are clear to auscultation, no crackles or wheezes. Heart:  Normal rate and regular rhythm. S1 and S2 normal without gallop, murmur, click, rub or other extra sounds. Abdomen:  Bowel sounds positive,abdomen soft and non-tender without masses, organomegaly or hernias noted. sis noted of thoracic or lumbar spine.   Extremities:  No clubbing, cyanosis, edema, or deformity noted with normal full range of motion of all joints.   Neurologic:  alert & oriented X3 and gait normal.   Skin:  Intact without suspicious lesions or rashes Cervical Nodes:  No lymphadenopathy noted Axillary Nodes:  No palpable lymphadenopathy Psych:  Cognition and judgment appear intact. Alert and cooperative with normal attention span and concentration. No apparent delusions, illusions, hallucinations      Assessment & Plan:   Well woman exam  Hyperlipidemia  Encounter for routine gynecological examination No Follow-up on file.

## 2016-03-29 NOTE — Telephone Encounter (Signed)
Pt called. Would like to know if she should wait to take new cholesterol medicine until after she finishes UTI medication?  Please call back # [phone]

## 2016-03-29 NOTE — Telephone Encounter (Signed)
Spoke to pt and advised per Dr Aron.  

## 2016-03-29 NOTE — Patient Instructions (Signed)
Great to see you. We are starting pravastatin 10 mg daily. Please return in 8 weeks for labs.

## 2016-03-29 NOTE — Assessment & Plan Note (Signed)
Deteriorated. Willing to try another statin- eRx sent for pravachol 10 mg daily. Repeat labs in 8 weeks. The patient indicates understanding of these issues and agrees with the plan.

## 2016-03-30 ENCOUNTER — Ambulatory Visit (INDEPENDENT_AMBULATORY_CARE_PROVIDER_SITE_OTHER): Payer: Commercial Managed Care - HMO | Admitting: Obstetrics and Gynecology

## 2016-03-30 ENCOUNTER — Encounter: Payer: Self-pay | Admitting: Obstetrics and Gynecology

## 2016-03-30 VITALS — BP 102/52 | HR 56 | Ht 60.0 in | Wt 113.0 lb

## 2016-03-30 DIAGNOSIS — N814 Uterovaginal prolapse, unspecified: Secondary | ICD-10-CM

## 2016-03-30 DIAGNOSIS — N952 Postmenopausal atrophic vaginitis: Secondary | ICD-10-CM | POA: Diagnosis not present

## 2016-03-30 DIAGNOSIS — N393 Stress incontinence (female) (male): Secondary | ICD-10-CM

## 2016-03-30 DIAGNOSIS — R3915 Urgency of urination: Secondary | ICD-10-CM | POA: Diagnosis not present

## 2016-03-30 MED ORDER — OXYQUINOLONE SULFATE 0.025 % VA GEL: 1.0000 | VAGINAL | Status: DC

## 2016-03-30 NOTE — Progress Notes (Signed)
    GYNECOLOGY PROGRESS NOTE  Subjective:    Patient ID: Heidi Mitchell, female    DOB: [birth year], 65 y.o.   MRN: [id number]  HPI  Patient is a 65 y.o. G67P1001 female who presents for pessary insertion (exchange size). Does report mild vaginal swelling, unsure of cause.  Notes current pessary is harder to remove and clean.   The following portions of the patient's history were reviewed and updated as appropriate: allergies, current medications, past family history, past medical history, past social history, past surgical history and problem list.  Review of Systems Pertinent items noted in HPI and remainder of comprehensive ROS otherwise negative.   Objective:   Blood pressure 102/52, pulse 56, height 5' (1.524 m), weight 113 lb (51.256 kg), last menstrual period 11/10/2012. General appearance: alert and no distress Abdomen: soft, non-tender; bowel sounds normal; no masses,  no organomegaly Pelvic: External genitalia: normal general appearance and cervix visible past the introitus Urinary system: urethral meatus normal and bladder fullness present Vaginal: atrophic mucosa and cystocele present, Grade 1-2.  Mild labial swelling noted, no erythema or tenderness.  Cervix: normal appearance and no lesions Adnexa: non palpable and nontender Uterus: normal single, nontender and Grade 3 prolapse Rectal: good sphincter tone and no masses Extremities: extremities normal, atraumatic, no cyanosis or edema Neurologic: Grossly normal   Assessment:   Cystocele with uterine descensus  Vaginal atrophy  Stress incontinence  Urinary urgency  Plan:   - Size 2 dish with knob inserted today (previously recommended size 3 dish with knob, however wrong size ordered.  Patient was ok to try Size 2 today.  Discussed possibility of expulsion with strong Valsalva or other strenuous movements.  - To f/u in 2 weeks for pessary check.  Continue using Trimo-San gel as prescribed, twice weekly.  Desires  refill.  Refill given.      Rubie Maid, MD [facility]

## 2016-04-02 ENCOUNTER — Ambulatory Visit: Payer: Commercial Managed Care - HMO | Admitting: Sports Medicine

## 2016-04-13 ENCOUNTER — Ambulatory Visit: Payer: Commercial Managed Care - HMO | Admitting: Sports Medicine

## 2016-04-22 ENCOUNTER — Encounter: Payer: Self-pay | Admitting: Obstetrics and Gynecology

## 2016-04-22 ENCOUNTER — Ambulatory Visit (INDEPENDENT_AMBULATORY_CARE_PROVIDER_SITE_OTHER): Payer: Commercial Managed Care - HMO | Admitting: Obstetrics and Gynecology

## 2016-04-22 VITALS — BP 120/66 | Ht 60.0 in | Wt 112.7 lb

## 2016-04-22 DIAGNOSIS — N814 Uterovaginal prolapse, unspecified: Secondary | ICD-10-CM | POA: Diagnosis not present

## 2016-04-22 DIAGNOSIS — N952 Postmenopausal atrophic vaginitis: Secondary | ICD-10-CM | POA: Diagnosis not present

## 2016-04-22 DIAGNOSIS — Z4689 Encounter for fitting and adjustment of other specified devices: Secondary | ICD-10-CM | POA: Diagnosis not present

## 2016-04-22 NOTE — Progress Notes (Signed)
    GYNECOLOGY PROGRESS NOTE  Subjective:    Patient ID: Heidi Mitchell, female    DOB: October 04, 1950, 65 y.o.   MRN: [id number]  HPI  Patient is a 65 y.o. G72P1001 female who presents for pessary check.  Notes that current pessary (size 2 ring incontinence dish has been occasionally slipping out).  Also notes that site where she had a biopsy performed years ago was tender and thinks that the pessary is rubbing against it. She reports no vaginal bleeding or discharge. She denies pelvic discomfort and difficulty urinating or moving her bowels.  The following portions of the patient's history were reviewed and updated as appropriate: allergies, current medications, past family history, past medical history, past social history, past surgical history and problem list.  Review of Systems Pertinent items noted in HPI and remainder of comprehensive ROS otherwise negative.   Objective:   Blood pressure 120/66, height 5' (1.524 m), weight 112 lb 11.2 oz (51.1 kg), last menstrual period 11/10/2012. General appearance: alert and no distress Abdomen: soft, non-tender; bowel sounds normal; no masses,  no organomegaly Pelvic: External genitalia: normal general appearance and cervix visible past the introitus Urinary system: urethral meatus normal and bladder fullness present Vaginal: atrophic mucosa and cystocele present, Grade 1-2.  no erythema or tenderness. The patient's Size 2 incontinence dish with knob pessary was removed, cleaned.  A Size 3 incontinence dish was then placed without complications.    Assessment:   Pessary maintenance  Vaginal atrophy  Cystocele with uterine descensus   Plan:   - Size 3 dish with knob inserted today (previously inserted size 2 dish with knob due to incorrect size being ordered, however patient was willing to give it a try).   - To f/u in 8 weeks for pessary check. Advised on use of an estrogen therapy.  Tried Premarin in the past but could not afford.  Has  changed insurance, will prescribe Estrace twice weekly.  Can use Trimo-san gel as needed if still unable to use Estrace.    Rubie Maid, MD [facility]

## 2016-04-23 ENCOUNTER — Encounter: Payer: Self-pay | Admitting: Sports Medicine

## 2016-04-23 ENCOUNTER — Ambulatory Visit (INDEPENDENT_AMBULATORY_CARE_PROVIDER_SITE_OTHER): Payer: Commercial Managed Care - HMO | Admitting: Sports Medicine

## 2016-04-23 DIAGNOSIS — L84 Corns and callosities: Secondary | ICD-10-CM | POA: Diagnosis not present

## 2016-04-23 DIAGNOSIS — L600 Ingrowing nail: Secondary | ICD-10-CM | POA: Diagnosis not present

## 2016-04-23 DIAGNOSIS — M79674 Pain in right toe(s): Secondary | ICD-10-CM | POA: Diagnosis not present

## 2016-04-23 DIAGNOSIS — N814 Uterovaginal prolapse, unspecified: Secondary | ICD-10-CM | POA: Insufficient documentation

## 2016-04-23 DIAGNOSIS — M79673 Pain in unspecified foot: Secondary | ICD-10-CM | POA: Diagnosis not present

## 2016-04-23 DIAGNOSIS — Q828 Other specified congenital malformations of skin: Secondary | ICD-10-CM

## 2016-04-23 NOTE — Progress Notes (Signed)
Subjective: Heidi Mitchell is a 65 y.o. female patient presents to office today complaining of a painful incurvated, swollen lateral nail border of the 1st toe on the Right foot. This has been present for some time and tried trimming. Patient denies fever/chills/nausea/vomitting/any other related constitutional symptoms at this time.  Also reports painful callus skin on both feet and desires treatment. No other issues.   Patient Active Problem List   Diagnosis Date Noted  . Cystocele with uterine descensus 04/23/2016  . Welcome to Medicare preventive visit 01/20/2015  . History of abnormal cervical Pap smear 05/28/2013  . Anxiety 02/11/2011  . Hyperlipidemia 01/27/2011  . Long term use of drug 01/27/2011    Current Outpatient Prescriptions on File Prior to Visit  Medication Sig Dispense Refill  . OXYQUINOLONE SULFATE VAGINAL (TRIMO-SAN) 0.025 % GEL Place 1 Applicatorful vaginally 2 (two) times a week. 1 Tube 4  . pravastatin (PRAVACHOL) 10 MG tablet Take 1 tablet (10 mg total) by mouth daily. 30 tablet 3   No current facility-administered medications on file prior to visit.     No Known Allergies  Objective:  There were no vitals filed for this visit.  General: Well developed, nourished, in no acute distress, alert and oriented x3   Dermatology: Skin is warm, dry and supple bilateral. Right hallux nail appears to be severely incurvated with hyperkeratosis formation at the distal aspects of  the lateral nail border. (-) Erythema. (+) Edema. (-) serosanguous drainage present. The remaining nails appear unremarkable at this time. There are no open sores, lesions or other signs of infection present. + Hyperkeratotic tissue/callus with nucleated core 1st interspace and plantar forefoot on left x 2.   Vascular: Dorsalis Pedis artery and Posterior Tibial artery pedal pulses are 2/4 bilateral with immedate capillary fill time. Pedal hair growth present. No lower extremity edema.    Neruologic: Grossly intact via light touch bilateral.  Musculoskeletal: Tenderness to palpation of the right hallux lateal nail fold and at callus sites. + Bunion and hammertoes with fat pad displacement. Muscular strength within normal limits in all groups bilateral.   Assesement and Plan: Problem List Items Addressed This Visit    None    Visit Diagnoses    Ingrown right big toenail    -  Primary   lateral border   Corns and callus       1st interdigital space (hallux and 2nd digit) calluses bilateral.    Porokeratosis       Foot pain, unspecified laterality         -Complete examination performed by Dr. Amalia Hailey -Discussed treatment alternatives and plan of care; Explained permanent/temporary nail avulsion and post procedure course to patient. Patient opt for PNA right hallux lateral margin - After a verbal consent, injected 3 ml of a 50:50 mixture of 2% plain lidocaine and 0.5% plain marcaine in a normal hallux block fashion. Next, a betadine prep was performed. Anesthesia was tested and found to be appropriate. The offending Right hallux lateral nail border was then incised from the hyponychium to the epinychium. The offending nail border was removed and cleared from the field. The area was curretted for any remaining nail or spicules. Phenol application performed and the area was then flushed with alcohol and dressed with antibiotic cream and a dry sterile dressing. -Patient was instructed to leave the dressing intact for today and begin soaking in a weak solution of betadine and water tomorrow. Patient was instructed to  soak for 15 minutes each  day and apply neosporin and a gauze or bandaid dressing each day. -Patient was instructed to monitor the toe for signs of infection and return to office if toe becomes red, hot or swollen. -Advised ice, elevation, and tylenol or motrin if needed for pain.  -Mechanically debrided keratosis using sterile chisel blade bilateral and applied  offloading pad plantar forefoot -Recommend good supportive shoes for foot type -Patient is to return in 1 week for Nail check or sooner if problems arise.  Landis Martins, DPM

## 2016-04-23 NOTE — Patient Instructions (Signed)

## 2016-04-30 ENCOUNTER — Ambulatory Visit (INDEPENDENT_AMBULATORY_CARE_PROVIDER_SITE_OTHER): Payer: Commercial Managed Care - HMO | Admitting: Sports Medicine

## 2016-04-30 ENCOUNTER — Encounter: Payer: Self-pay | Admitting: Sports Medicine

## 2016-04-30 DIAGNOSIS — L600 Ingrowing nail: Secondary | ICD-10-CM

## 2016-04-30 DIAGNOSIS — M79673 Pain in unspecified foot: Secondary | ICD-10-CM

## 2016-04-30 DIAGNOSIS — Z9889 Other specified postprocedural states: Secondary | ICD-10-CM

## 2016-04-30 NOTE — Patient Instructions (Signed)

## 2016-04-30 NOTE — Progress Notes (Signed)
Subjective: BAYLEN SCHOPF is a 65 y.o. female patient returns to office today for follow up evaluation after having Right Hallux lateral permanent nail avulsion performed on 04-23-16. Patient has been soaking using betadine and applying topical antibiotic covered with bandaid daily. Patient denies fever/chills/nausea/vomitting/any other related constitutional symptoms at this time.  Patient Active Problem List   Diagnosis Date Noted  . Cystocele with uterine descensus 04/23/2016  . Welcome to Medicare preventive visit 01/20/2015  . History of abnormal cervical Pap smear 05/28/2013  . Anxiety 02/11/2011  . Hyperlipidemia 01/27/2011  . Long term use of drug 01/27/2011    Current Outpatient Prescriptions on File Prior to Visit  Medication Sig Dispense Refill  . OXYQUINOLONE SULFATE VAGINAL (TRIMO-SAN) 0.025 % GEL Place 1 Applicatorful vaginally 2 (two) times a week. 1 Tube 4  . pravastatin (PRAVACHOL) 10 MG tablet Take 1 tablet (10 mg total) by mouth daily. 30 tablet 3   No current facility-administered medications on file prior to visit.     No Known Allergies  Objective:  General: Well developed, nourished, in no acute distress, alert and oriented x3   Dermatology: Skin is warm, dry and supple bilateral. Right hallux lateral nail bed appears to be clean, dry, with mild granular tissue and surrounding eschar/scab. (-) Erythema. (-) Edema. (-) serosanguous drainage present. The remaining nails appear unremarkable at this time. There are no other lesions or other signs of infection present. Keratosis well trimmed since last visit.  Neurovascular status: Intact. No lower extremity swelling; No pain with calf compression bilateral.  Musculoskeletal: Decreased tenderness to palpation of the Right hallux lateral nail fold. Muscular strength within normal limits bilateral.   Assesement and Plan: Problem List Items Addressed This Visit    None    Visit Diagnoses    Ingrown right big  toenail    -  Primary   S/P nail surgery       Foot pain, unspecified laterality         -Examined patient  -Cleansed right hallux lateral nail fold and gently scrubbed with peroxide and q-tip/curetted away eschar at site and applied antibiotic cream covered with bandaid.  -Discussed plan of care with patient. -Patient to now begin soaking in a weak solution of Epsom salt and warm water. Patient was instructed to soak for 15-20 minutes each day until the toe appears normal and there is no drainage, redness, tenderness, or swelling at the procedure site, and apply neosporin and a gauze or bandaid dressing each day as needed. May leave open to air at night. -Educated patient on long term care after nail surgery. -Patient was instructed to monitor the toe for reoccurrence and signs of infection; Patient advised to return to office or go to ER if toe becomes red, hot or swollen. -Patient is to return as needed or sooner if problems arise.  Landis Martins, DPM

## 2016-05-24 ENCOUNTER — Telehealth: Payer: Self-pay

## 2016-05-24 NOTE — Telephone Encounter (Signed)
Pt left /vm; mail order pharmacy will be requesting refills on meds; pt wanted FYI to Dr Deborra Medina and her asst.

## 2016-05-25 ENCOUNTER — Telehealth: Payer: Self-pay | Admitting: Obstetrics and Gynecology

## 2016-05-25 DIAGNOSIS — N952 Postmenopausal atrophic vaginitis: Secondary | ICD-10-CM

## 2016-05-25 DIAGNOSIS — Z4689 Encounter for fitting and adjustment of other specified devices: Secondary | ICD-10-CM

## 2016-05-25 MED ORDER — ESTRADIOL 0.1 MG/GM VA CREA
1.0000 | TOPICAL_CREAM | VAGINAL | 12 refills | Status: DC
Start: 2016-05-27 — End: 2019-01-09

## 2016-05-25 NOTE — Telephone Encounter (Signed)
Done RX sent

## 2016-05-25 NOTE — Telephone Encounter (Signed)
This pt was given some samples of estrace and would like a RX seen to [facility]

## 2016-05-28 ENCOUNTER — Other Ambulatory Visit: Payer: Self-pay

## 2016-05-28 ENCOUNTER — Other Ambulatory Visit: Payer: Commercial Managed Care - HMO

## 2016-05-28 ENCOUNTER — Other Ambulatory Visit (INDEPENDENT_AMBULATORY_CARE_PROVIDER_SITE_OTHER): Payer: Commercial Managed Care - HMO

## 2016-05-28 DIAGNOSIS — Z79899 Other long term (current) drug therapy: Secondary | ICD-10-CM

## 2016-05-28 DIAGNOSIS — E785 Hyperlipidemia, unspecified: Secondary | ICD-10-CM | POA: Diagnosis not present

## 2016-05-28 LAB — LIPID PANEL
Cholesterol: 180 mg/dL (ref 0–200)
HDL: 60.6 mg/dL (ref >39.00)
LDL CALC: 110 mg/dL — AB (ref 0–99)
NonHDL: 119.3
TRIGLYCERIDES: 48 mg/dL (ref 0.0–149.0)
Total CHOL/HDL Ratio: 3
VLDL: 9.6 mg/dL (ref 0.0–40.0)

## 2016-05-28 LAB — HEPATIC FUNCTION PANEL
ALT: 11 U/L (ref 0–35)
AST: 18 U/L (ref 0–37)
Albumin: 3.9 g/dL (ref 3.5–5.2)
Alkaline Phosphatase: 55 U/L (ref 39–117)
BILIRUBIN DIRECT: 0.2 mg/dL (ref 0.0–0.3)
BILIRUBIN TOTAL: 1 mg/dL (ref 0.2–1.2)
Total Protein: 6.6 g/dL (ref 6.0–8.3)

## 2016-05-28 MED ORDER — PRAVASTATIN SODIUM 10 MG PO TABS: 10.0000 mg | ORAL_TABLET | Freq: Every day | ORAL | 2 refills | Status: DC

## 2016-05-28 NOTE — Telephone Encounter (Signed)
Pt called and request refill pravastatin to [facility]; pt thought [facility] had requested refill days ago. I refilled per protocol and pt voiced understanding.

## 2016-05-31 ENCOUNTER — Encounter: Payer: Self-pay | Admitting: *Deleted

## 2016-06-17 ENCOUNTER — Ambulatory Visit: Payer: Commercial Managed Care - HMO | Admitting: Obstetrics and Gynecology

## 2016-07-01 ENCOUNTER — Telehealth: Payer: Self-pay | Admitting: Family Medicine

## 2016-07-01 NOTE — Telephone Encounter (Signed)
Patient insists on either seeing Dr. Darnell Level or Dr. Deborra Medina tomorrow for symptoms (refer to note from [facility]) and refuses urgent care.    Dr. Deborra Medina is out of the office on Friday.    Please advise whether Dr. Darnell Level. Would be able to work patient in tomorrow Friday 07/02/16 or try to schedule her with Np?

## 2016-07-01 NOTE — Telephone Encounter (Signed)
Patient Name: Heidi Mitchell  DOB: December 28, 1950    Initial Comment Caller states she is having difficulty breathing.   Nurse Assessment  Nurse: Raphael Gibney, RN, Vera Date/Time (Eastern Time): 07/01/2016 1:55:02 PM  Confirm and document reason for call. If symptomatic, describe symptoms. You must click the next button to save text entered. ---Caller states she is having trouble breathing. Has occasional heaviness in her chest. No cough or congestion. No fever. No swelling.  Has the patient traveled out of the country within the last 30 days? ---No  Does the patient have any new or worsening symptoms? ---Yes  Will a triage be completed? ---Yes  Related visit to physician within the last 2 weeks? ---No  Does the PT have any chronic conditions? (i.e. diabetes, asthma, etc.) ---No  Is this a behavioral health or substance abuse call? ---No     Guidelines    Guideline Title Affirmed Question Affirmed Notes  Breathing Difficulty [1] MILD difficulty breathing (e.g., minimal/no SOB at rest, SOB with walking, pulse <100) AND [2] NEW-onset or WORSE than normal    Final Disposition User   See Physician within 4 Hours (or PCP triage) Raphael Gibney, RN, Vanita Ingles    Comments  pt does not want to see anyone but Dr. Darnell Level or Dr. Deborra Medina and no appts available today within 4 hrs. does not want to go to urgent care. States if she can not see Dr. Darnell Level or Dr. Deborra Medina today she would like appt for tomorrow Please call pt back today regarding appt.   Referrals  GO TO FACILITY REFUSED   Disagree/Comply: Disagree  Disagree/Comply Reason: Disagree with instructions

## 2016-07-02 NOTE — Telephone Encounter (Signed)
Could place at 12:30pm if my other patient doesn't come in o/w recommend visit with available provider.

## 2016-07-02 NOTE — Telephone Encounter (Signed)
Spoke with patient. No appts available with Dr. Darnell Level today and Dr. Deborra Medina is out of office on Fridays. Was trying to get info on patient and she was telling me that her husband was more sick than she was and needed eval. (See his chart note). She refused to schedule anything for her at this time and said she would call back and schedule an appt once he was taken care of.

## 2016-07-28 ENCOUNTER — Telehealth: Payer: Self-pay | Admitting: Family Medicine

## 2016-07-28 ENCOUNTER — Ambulatory Visit (INDEPENDENT_AMBULATORY_CARE_PROVIDER_SITE_OTHER): Payer: Commercial Managed Care - HMO | Admitting: Family Medicine

## 2016-07-28 ENCOUNTER — Encounter: Payer: Self-pay | Admitting: Family Medicine

## 2016-07-28 VITALS — BP 124/64 | HR 55 | Temp 97.9°F | Wt 117.0 lb

## 2016-07-28 DIAGNOSIS — F419 Anxiety disorder, unspecified: Secondary | ICD-10-CM | POA: Diagnosis not present

## 2016-07-28 DIAGNOSIS — R0602 Shortness of breath: Secondary | ICD-10-CM | POA: Diagnosis not present

## 2016-07-28 MED ORDER — ALPRAZOLAM 0.25 MG PO TABS: 0.2500 mg | ORAL_TABLET | Freq: Two times a day (BID) | ORAL | 0 refills | Status: DC | PRN

## 2016-07-28 MED ORDER — SERTRALINE HCL 50 MG PO TABS: ORAL_TABLET | ORAL | 1 refills | Status: DC

## 2016-07-28 NOTE — Telephone Encounter (Signed)
Pt has appt with Glenda Chroman FNP on 07/28/16 at 4:30.

## 2016-07-28 NOTE — Patient Instructions (Signed)
Please follow up with Dr. Deborra Medina in 4-6 weeks, sooner if symptoms worsen  Generalized Anxiety Disorder Generalized anxiety disorder (GAD) is a mental disorder. It interferes with life functions, including relationships, work, and school. GAD is different from normal anxiety, which everyone experiences at some point in their lives in response to specific life events and activities. Normal anxiety actually helps [name] prepare for and get through these life events and activities. Normal anxiety goes away after the event or activity is over.  GAD causes anxiety that is not necessarily related to specific events or activities. It also causes excess anxiety in proportion to specific events or activities. The anxiety associated with GAD is also difficult to control. GAD can vary from mild to severe. People with severe GAD can have intense waves of anxiety with physical symptoms (panic attacks).  SYMPTOMS The anxiety and worry associated with GAD are difficult to control. This anxiety and worry are related to many life events and activities and also occur more days than not for 6 months or longer. People with GAD also have three or more of the following symptoms (one or more in children):  Restlessness.   Fatigue.  Difficulty concentrating.   Irritability.  Muscle tension.  Difficulty sleeping or unsatisfying sleep. DIAGNOSIS GAD is diagnosed through an assessment by your health care provider. Your health care provider will ask you questions aboutyour mood,physical symptoms, and events in your life. Your health care provider may ask you about your medical history and use of alcohol or drugs, including prescription medicines. Your health care provider may also do a physical exam and blood tests. Certain medical conditions and the use of certain substances can cause symptoms similar to those associated with GAD. Your health care provider may refer you to a mental health specialist for further  evaluation. TREATMENT The following therapies are usually used to treat GAD:   Medication. Antidepressant medication usually is prescribed for long-term daily control. Antianxiety medicines may be added in severe cases, especially when panic attacks occur.   Talk therapy (psychotherapy). Certain types of talk therapy can be helpful in treating GAD by providing support, education, and guidance. A form of talk therapy called cognitive behavioral therapy can teach you healthy ways to think about and react to daily life events and activities.  Stress managementtechniques. These include yoga, meditation, and exercise and can be very helpful when they are practiced regularly. A mental health specialist can help determine which treatment is best for you. Some people see improvement with one therapy. However, other people require a combination of therapies. This information is not intended to replace advice given to you by your health care provider. Make sure you discuss any questions you have with your health care provider. Document Released: 12/25/2012 Document Revised: 09/20/2014 Document Reviewed: 12/25/2012 Elsevier Interactive Patient Education  2017 [employer].

## 2016-07-28 NOTE — Telephone Encounter (Signed)
°  Patient Name: Heidi Mitchell  DOB: 08/29/1951    Initial Comment Caller states that she is short of breath and she wants to make an appointment.   Nurse Assessment  Nurse: Julien Girt, RN, Almyra Free Date/Time Eilene Ghazi Time): 07/28/2016 10:11:49 AM  Confirm and document reason for call. If symptomatic, describe symptoms. You must click the next button to save text entered. ---Caller states she is very sob. Adds she has this problem randomly, no dx, this episode began 2 months ago but she has not been able to get in to be seen. States in the past they have given her "nerve pills" due to her stress at work and they just make her sleep.  Has the patient traveled out of the country within the last 30 days? ---Not Applicable  Does the patient have any new or worsening symptoms? ---Yes  Will a triage be completed? ---Yes  Related visit to physician within the last 2 weeks? ---No  Does the PT have any chronic conditions? (i.e. diabetes, asthma, etc.) ---Yes  List chronic conditions. ---Hx SOB, High cholesterol.  Is this a behavioral health or substance abuse call? ---No     Guidelines    Guideline Title Affirmed Question Affirmed Notes  Breathing Difficulty [1] MODERATE difficulty breathing (e.g., speaks in phrases, SOB even at rest, pulse 100-120) AND [2] NEW-onset or WORSE than normal    Final Disposition User   Go to ED Now Julien Girt, RN, Almyra Free    Referrals  GO TO FACILITY REFUSED   Disagree/Comply: Disagree  Disagree/Comply Reason: Disagree with instructions

## 2016-07-28 NOTE — Progress Notes (Signed)
Pre visit review using our clinic review tool, if applicable. No additional management support is needed unless otherwise documented below in the visit note. 

## 2016-07-28 NOTE — Progress Notes (Signed)
Subjective:    Patient ID: Heidi Mitchell, female    DOB: 05-18-1951, 65 y.o.   MRN: [id number]  HPI This is a 65 yo female who presents today with shortness of breath. She has had this off and on for years (35 years). Has noticed in the past and was previously worse as the day would go on, but now it seems to come on when she wakes up. Sometimes feels congested and gets some relief with sneezing. Rare cough, non productive. No wheezing. No worse with lying down. No swelling in feet or ankles. Feels some soreness in her chest on the muscles. 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 No heart burn, occasional pain if she is hungry. Rare acid reflux.  Never smoked, no history asthma. Has had similar symptoms and was seen for evaluation of allergies and was told she did not have asthma. She has worsening symptoms with heavy perfume. When she feels SOB, feels like she can not get a full breath. When she is having a long conversation, has to stop and catch her breath.  She has been under a great deal of stress over the last 3 years. Her daughter in law, who was her best friend, passed away. Her husband had a bypass surgery and stroke and now has a benign brain tumor. She helps with her 58 yo grandson and he is going through "a rebellious stage." She has had to transition to part time work which has caused financial difficulties. She goes to bed around 10 pm and gets up at 3 am so she can be at her son's house by 5. Her son is currently involved in a relationship and the patient feels like he is not as attentive to his son as he should be. Her son is not supportive in her efforts to keep her grandson on track for his school work. Sometimes has difficulty falling asleep.  Was seen several years ago for similar symptoms and was given xanax. She did not use more than a couple of the pills and the medication has long since  expired.   Past Medical History:  Diagnosis Date  . Unspecified disorder of bladder   . Uterine prolapse    Past Surgical History:  Procedure Laterality Date  . APPENDECTOMY  1990  . bartholins cyst  1978   No family history on file. Social History  Substance Use Topics  . Smoking status: Never Smoker  . Smokeless tobacco: Never Used  . Alcohol use No      Review of Systems Per HPI    Objective:   Physical Exam Physical Exam  Constitutional: Oriented to person, place, and time. She appears well-developed and well-nourished.  HENT:  Head: Normocephalic and atraumatic.  Eyes: Conjunctivae are normal.  Neck: Normal range of motion. Neck supple.  Cardiovascular: Normal rate, regular rhythm and normal heart sounds.   Pulmonary/Chest: Effort normal and breath sounds normal.  Musculoskeletal: Normal range of motion.  Neurological: Alert and oriented to person, place, and time.  Skin: Skin is warm and dry.  Psychiatric: Normal mood and affect. Behavior is normal. Judgment and thought content normal.  Vitals reviewed.     BP 124/64   Pulse (!) 55   Temp 97.9 F (36.6 C) (Oral)   Wt 117 lb (53.1 kg)   LMP 11/10/2012   SpO2 99%   BMI 22.85 kg/m  Wt Readings from  Last 3 Encounters:  07/28/16 117 lb (53.1 kg)  04/22/16 112 lb 11.2 oz (51.1 kg)  03/30/16 113 lb (51.3 kg)          Assessment & Plan:  1. SOB (shortness of breath) - PR BREATHING CAPACITY TEST- normal spirometry - this is a longstanding problem that seems to be related to anxiety, PFTs, pulse ox, physical exam reassuring.   2. Anxiety - discussed patient's multiple stressors and she would like to try a long term, daily medication.  Encouraged her to consider treatment for at least 6-12 months and discussed possible side effects - sertraline (ZOLOFT) 50 MG tablet; Take 1/2 tablet by mouth for one week then increase to 1 tablet  Dispense: 90 tablet; Refill: 1 - ALPRAZolam (XANAX) 0.25 MG tablet;  Take 1 tablet (0.25 mg total) by mouth 2 (two) times daily as needed for anxiety.  Dispense: 20 tablet; Refill: 0 - follow up appointment with Dr. Diona Browner made for approximately 6 weeks. She will RTC sooner if symptoms worsen  Clarene Reamer, FNP-BC  [facility] [place]  07/29/2016 8:35 PM

## 2016-08-03 ENCOUNTER — Telehealth: Payer: Self-pay

## 2016-08-03 DIAGNOSIS — F419 Anxiety disorder, unspecified: Secondary | ICD-10-CM

## 2016-08-03 MED ORDER — SERTRALINE HCL 50 MG PO TABS: ORAL_TABLET | ORAL | 1 refills | Status: DC

## 2016-08-03 NOTE — Telephone Encounter (Signed)
Please call pt to help her with this refill request.

## 2016-08-03 NOTE — Telephone Encounter (Signed)
Pt left v/m that [facility] mail order request verification of instructions for the zoloft; D Carlean Purl FNP is not in office and does not review computer when not in office. Please advise.

## 2016-08-04 ENCOUNTER — Telehealth: Payer: Self-pay

## 2016-08-04 MED ORDER — SERTRALINE HCL 50 MG PO TABS: 50.0000 mg | ORAL_TABLET | Freq: Every day | ORAL | 3 refills | Status: DC

## 2016-08-04 NOTE — Telephone Encounter (Signed)
New Rx sent to pharmacy earlier today

## 2016-08-04 NOTE — Telephone Encounter (Signed)
Pt states it is Dr Hulen Shouts decision as to how medications are to be taken. pls advise

## 2016-08-04 NOTE — Telephone Encounter (Signed)
Patient left VM on triage because [employer] has been trying to reach the office several times to confirm some information for the mail order. They said that you can fax the information to them.  Fax number: [phone]

## 2016-08-04 NOTE — Telephone Encounter (Signed)
If she tolerated 1 tablet ok, then let's continue that dose.  Zoloft 50 mg daily sent to mail order pharmacy.

## 2016-08-11 NOTE — Telephone Encounter (Addendum)
Pt left v/m Request cb today about the status of information sent to [facility].

## 2016-08-11 NOTE — Telephone Encounter (Signed)
Spoke to pt and advised her new Rx sent to the pharmacy 11/22

## 2016-08-11 NOTE — Telephone Encounter (Signed)
Please see the note below. This was sent to the pharmacy a week ago

## 2016-08-18 NOTE — Telephone Encounter (Signed)
Pt left /vm requesting cb to verify pt is to take sertraline generic zoloft 50 mg taking one tablet po daily. Per DPR left detailed v/m verifying pt should take sertraline 50 mg one tab po daily.

## 2016-09-20 ENCOUNTER — Ambulatory Visit: Payer: Commercial Managed Care - HMO | Admitting: Family Medicine

## 2016-11-02 ENCOUNTER — Telehealth: Payer: Self-pay

## 2016-11-02 NOTE — Telephone Encounter (Signed)
It's certainly a possibility.  She could try to wean off of it- 1 tablet every other day for a week, then 1/2 tablet every other day for a week and then stop to see if it helps.

## 2016-11-02 NOTE — Telephone Encounter (Signed)
Pt left vm; pt is presently taking sertraline and pt has been having involuntary muscle movement and wonders if the sertraline could cause that. Pt request cb.

## 2016-11-03 NOTE — Telephone Encounter (Signed)
Pt was notified of instructions, pt verbalized understanding.   

## 2016-12-27 ENCOUNTER — Other Ambulatory Visit: Payer: Self-pay | Admitting: Family Medicine

## 2017-02-21 ENCOUNTER — Telehealth: Payer: Self-pay | Admitting: Pediatrics

## 2017-02-21 ENCOUNTER — Ambulatory Visit (INDEPENDENT_AMBULATORY_CARE_PROVIDER_SITE_OTHER): Payer: Medicare HMO | Admitting: Family Medicine

## 2017-02-21 ENCOUNTER — Encounter: Payer: Self-pay | Admitting: Family Medicine

## 2017-02-21 VITALS — BP 118/62 | Temp 97.8°F | Wt 117.2 lb

## 2017-02-21 DIAGNOSIS — R300 Dysuria: Secondary | ICD-10-CM | POA: Insufficient documentation

## 2017-02-21 LAB — POCT URINALYSIS DIPSTICK
Bilirubin, UA: NEGATIVE
Blood, UA: NEGATIVE
GLUCOSE UA: NEGATIVE
KETONES UA: NEGATIVE
Nitrite, UA: NEGATIVE
Protein, UA: NEGATIVE
Spec Grav, UA: 1.02 (ref 1.010–1.025)
Urobilinogen, UA: 0.2 E.U./dL
pH, UA: 6 (ref 5.0–8.0)

## 2017-02-21 MED ORDER — SULFAMETHOXAZOLE-TRIMETHOPRIM 800-160 MG PO TABS: 1.0000 | ORAL_TABLET | Freq: Two times a day (BID) | ORAL | 0 refills | Status: DC

## 2017-02-21 MED ORDER — CEPHALEXIN 500 MG PO CAPS: 500.0000 mg | ORAL_CAPSULE | Freq: Two times a day (BID) | ORAL | 0 refills | Status: DC

## 2017-02-21 NOTE — Telephone Encounter (Signed)
Per Dr. Darnell Level: Bactrim DS sent to [facility] instead of local pharmacy. Please call [employer] and dc the Bactrim. Rx was resent to local pharmacy.  I spoke to [place] at [place].  Rx was cancelled.

## 2017-02-21 NOTE — Assessment & Plan Note (Signed)
UTI vs irritation from pessary. UA/micro with WBC but no significant bacteria. Will send UCx. In interim, treat with bactrim DS 3d course. Also discussed importance of hydration status. Pt agrees with plan.

## 2017-02-21 NOTE — Progress Notes (Addendum)
BP 118/62   Temp 97.8 F (36.6 C) (Oral)   Wt 117 lb 4 oz (53.2 kg)   LMP 11/10/2012   BMI 22.90 kg/m    CC: UTI? Subjective:    Patient ID: Heidi Mitchell, female    DOB: 08-Sep-1951, 66 y.o.   MRN: [id number]  HPI: Heidi Mitchell is a 66 y.o. female presenting on 02/21/2017 for Dysuria (since this past Thursday)   Last week patient had painful urination, then over weekend improved. Still with mild dysuria. Some urgency and frequency initially. No hematuria, fever/chills, nausea/vomiting, flank pain.   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  Staying hydrated.  No recent bladder infections.   H/o cystocele with uterine prolapse treated with pessary. She does wash every 1 month. Pessary cleaned on Friday, symptoms started 1d prior, but improved with pessary cleaning.   Relevant past medical, surgical, family and social history reviewed and updated as indicated. Interim medical history since our last visit reviewed. Allergies and medications reviewed and updated. Outpatient Medications Prior to Visit  Medication Sig Dispense Refill  . ALPRAZolam (XANAX) 0.25 MG tablet Take 1 tablet (0.25 mg total) by mouth 2 (two) times daily as needed for anxiety. 20 tablet 0  . estradiol (ESTRACE VAGINAL) 0.1 MG/GM vaginal cream Place 1 Applicatorful vaginally 2 (two) times a week. 42.5 g 12  . pravastatin (PRAVACHOL) 10 MG tablet TAKE 1 TABLET EVERY DAY 90 tablet 1  . sertraline (ZOLOFT) 50 MG tablet Take 1 tablet (50 mg total) by mouth daily. 90 tablet 3   No facility-administered medications prior to visit.      Per HPI unless specifically indicated in ROS section below Review of Systems     Objective:    BP 118/62   Temp 97.8 F (36.6 C) (Oral)   Wt 117 lb 4 oz (53.2 kg)   LMP 11/10/2012   BMI 22.90 kg/m   Wt Readings from Last 3 Encounters:  02/21/17 117 lb 4 oz (53.2 kg)  07/28/16 117 lb (53.1 kg)  04/22/16 112 lb 11.2 oz (51.1 kg)    Physical Exam    Constitutional: She appears well-developed and well-nourished. No distress.  Abdominal: Soft. Normal appearance and bowel sounds are normal. She exhibits no distension and no mass. There is no hepatosplenomegaly. There is no tenderness. There is no rigidity, no rebound, no guarding, no CVA tenderness and negative Murphy's sign.  Psychiatric: She has a normal mood and affect.  Nursing note and vitals reviewed.  Results for orders placed or performed in visit on 02/21/17  POCT urinalysis dipstick  Result Value Ref Range   Color, UA Straw    Clarity, UA Clear    Glucose, UA Neg    Bilirubin, UA Neg    Ketones, UA Neg    Spec Grav, UA 1.020 1.010 - 1.025   Blood, UA Neg    pH, UA 6.0 5.0 - 8.0   Protein, UA Neg    Urobilinogen, UA 0.2 0.2 or 1.0 E.U./dL   Nitrite, UA Neg    Leukocytes, UA Small (1+) (A) Negative      Assessment & Plan:   Problem List Items Addressed This Visit    Dysuria - Primary    UTI vs irritation from pessary. UA/micro with WBC but no significant bacteria. Will send UCx. In interim, treat with bactrim DS 3d course. Also discussed importance of hydration status. Pt agrees with plan.      Relevant Orders  POCT urinalysis dipstick (Completed)   Urine Culture       Follow up plan: Return if symptoms worsen or fail to improve.  Ria Bush, MD

## 2017-02-21 NOTE — Addendum Note (Signed)
Addended by: Ria Bush on: 02/21/2017 04:07 PM   Modules accepted: Orders

## 2017-02-21 NOTE — Patient Instructions (Addendum)
You may have urine infection - treat with bactrim antibiotic for 3 days. We will send culture to be sure.  Let us know if not improving with treatment. Continue fluids and rest.  Watch for fever >101, worsening discomfort, nausea/vomiting, flank pain.

## 2017-02-22 LAB — URINE CULTURE

## 2017-05-02 ENCOUNTER — Ambulatory Visit: Payer: Medicare HMO

## 2017-05-09 ENCOUNTER — Encounter: Payer: Medicare HMO | Admitting: Family Medicine

## 2017-06-01 ENCOUNTER — Ambulatory Visit (INDEPENDENT_AMBULATORY_CARE_PROVIDER_SITE_OTHER): Payer: Medicare HMO

## 2017-06-01 ENCOUNTER — Other Ambulatory Visit: Payer: Self-pay | Admitting: Family Medicine

## 2017-06-01 VITALS — BP 100/60 | HR 57 | Temp 98.0°F | Ht 59.75 in | Wt 115.5 lb

## 2017-06-01 DIAGNOSIS — Z01419 Encounter for gynecological examination (general) (routine) without abnormal findings: Secondary | ICD-10-CM

## 2017-06-01 DIAGNOSIS — Z0000 Encounter for general adult medical examination without abnormal findings: Secondary | ICD-10-CM

## 2017-06-01 DIAGNOSIS — E2839 Other primary ovarian failure: Secondary | ICD-10-CM

## 2017-06-01 DIAGNOSIS — Z1239 Encounter for other screening for malignant neoplasm of breast: Secondary | ICD-10-CM

## 2017-06-01 DIAGNOSIS — Z1231 Encounter for screening mammogram for malignant neoplasm of breast: Secondary | ICD-10-CM | POA: Diagnosis not present

## 2017-06-01 LAB — CBC WITH DIFFERENTIAL/PLATELET
BASOS ABS: 0.1 10*3/uL (ref 0.0–0.1)
Basophils Relative: 1 % (ref 0.0–3.0)
Eosinophils Absolute: 0.1 10*3/uL (ref 0.0–0.7)
Eosinophils Relative: 1.4 % (ref 0.0–5.0)
HCT: 41.3 % (ref 36.0–46.0)
HEMOGLOBIN: 13.5 g/dL (ref 12.0–15.0)
LYMPHS ABS: 2.2 10*3/uL (ref 0.7–4.0)
Lymphocytes Relative: 29.9 % (ref 12.0–46.0)
MCHC: 32.6 g/dL (ref 30.0–36.0)
MCV: 89.3 fl (ref 78.0–100.0)
Monocytes Absolute: 0.7 10*3/uL (ref 0.1–1.0)
Monocytes Relative: 9.7 % (ref 3.0–12.0)
NEUTROS PCT: 58 % (ref 43.0–77.0)
Neutro Abs: 4.3 10*3/uL (ref 1.4–7.7)
Platelets: 212 10*3/uL (ref 150.0–400.0)
RBC: 4.63 Mil/uL (ref 3.87–5.11)
RDW: 15.2 % (ref 11.5–15.5)
WBC: 7.5 10*3/uL (ref 4.0–10.5)

## 2017-06-01 LAB — COMPREHENSIVE METABOLIC PANEL
ALK PHOS: 52 U/L (ref 39–117)
ALT: 13 U/L (ref 0–35)
AST: 22 U/L (ref 0–37)
Albumin: 4.4 g/dL (ref 3.5–5.2)
BILIRUBIN TOTAL: 1.1 mg/dL (ref 0.2–1.2)
BUN: 19 mg/dL (ref 6–23)
CO2: 29 mEq/L (ref 19–32)
Calcium: 9.9 mg/dL (ref 8.4–10.5)
Chloride: 104 mEq/L (ref 96–112)
Creatinine, Ser: 0.65 mg/dL (ref 0.40–1.20)
GFR: 96.41 mL/min (ref >60.00)
Glucose, Bld: 93 mg/dL (ref 70–99)
Potassium: 5 mEq/L (ref 3.5–5.1)
Sodium: 139 mEq/L (ref 135–145)
TOTAL PROTEIN: 7.1 g/dL (ref 6.0–8.3)

## 2017-06-01 LAB — LIPID PANEL
Cholesterol: 208 mg/dL — ABNORMAL HIGH (ref 0–200)
HDL: 77.7 mg/dL (ref >39.00)
LDL CALC: 116 mg/dL — AB (ref 0–99)
NONHDL: 130.43
Total CHOL/HDL Ratio: 3
Triglycerides: 70 mg/dL (ref 0.0–149.0)
VLDL: 14 mg/dL (ref 0.0–40.0)

## 2017-06-01 LAB — TSH: TSH: 1.39 u[IU]/mL (ref 0.35–4.50)

## 2017-06-01 NOTE — Patient Instructions (Addendum)
Heidi Mitchell , Thank you for taking time to come for your Medicare Wellness Visit. I appreciate your ongoing commitment to your health goals. Please review the following plan we discussed and let me know if I can assist you in the future.   These are the goals we discussed: Goals    . Increase water intake          Starting 06/01/2017, I will attempt to drink at least 6-8 glasses of water daily.        This is a list of the screening recommended for you and due dates:  Health Maintenance  Topic Date Due  . Flu Shot  12/11/2017*  . DEXA scan (bone density measurement)  06/01/2018*  . Mammogram  01/19/2019*  . Pneumonia vaccines (1 of 2 - PCV13) 03/19/2024*  . Stool Blood Test  02/03/2018  . Tetanus Vaccine  02/25/2025  .  Hepatitis C: One time screening is recommended by Center for Disease Control  (CDC) for  adults born from 18 through 1967.   Completed  *Topic was postponed. The date shown is not the original due date.   Preventive Care for Adults  A healthy lifestyle and preventive care can promote health and wellness. Preventive health guidelines for adults include the following key practices.  . A routine yearly physical is a good way to check with your health care provider about your health and preventive screening. It is a chance to share any concerns and updates on your health and to receive a thorough exam.  . Visit your dentist for a routine exam and preventive care every 6 months. Brush your teeth twice a day and floss once a day. Good oral hygiene prevents tooth decay and gum disease.  . The frequency of eye exams is based on your age, health, family medical history, use  of contact lenses, and other factors. Follow your health care provider's ecommendations for frequency of eye exams.  . Eat a healthy diet. Foods like vegetables, fruits, whole grains, low-fat dairy products, and lean protein foods contain the nutrients you need without too many calories. Decrease your  intake of foods high in solid fats, added sugars, and salt. Eat the right amount of calories for you. Get information about a proper diet from your health care provider, if necessary.  . Regular physical exercise is one of the most important things you can do for your health. Most adults should get at least 150 minutes of moderate-intensity exercise (any activity that increases your heart rate and causes you to sweat) each week. In addition, most adults need muscle-strengthening exercises on 2 or more days a week.  Silver Sneakers may be a benefit available to you. To determine eligibility, you may visit the website: www.silversneakers.com or contact program at (317)207-6473 Mon-Fri between 8AM-8PM.   . Maintain a healthy weight. The body mass index (BMI) is a screening tool to identify possible weight problems. It provides an estimate of body fat based on height and weight. Your health care provider can find your BMI and can help you achieve or maintain a healthy weight.   For adults 20 years and older: ? A BMI below 18.5 is considered underweight. ? A BMI of 18.5 to 24.9 is normal. ? A BMI of 25 to 29.9 is considered overweight. ? A BMI of 30 and above is considered obese.   . Maintain normal blood lipids and cholesterol levels by exercising and minimizing your intake of saturated fat. Eat a balanced diet with  plenty of fruit and vegetables. Blood tests for lipids and cholesterol should begin at age 44 and be repeated every 5 years. If your lipid or cholesterol levels are high, you are over 50, or you are at high risk for heart disease, you may need your cholesterol levels checked more frequently. Ongoing high lipid and cholesterol levels should be treated with medicines if diet and exercise are not working.  . If you smoke, find out from your health care provider how to quit. If you do not use tobacco, please do not start.  . If you choose to drink alcohol, please do not consume more than 2  drinks per day. One drink is considered to be 12 ounces (355 mL) of beer, 5 ounces (148 mL) of wine, or 1.5 ounces (44 mL) of liquor.  . If you are 62-22 years old, ask your health care provider if you should take aspirin to prevent strokes.  . Use sunscreen. Apply sunscreen liberally and repeatedly throughout the day. You should seek shade when your shadow is shorter than you. Protect yourself by wearing long sleeves, pants, a wide-brimmed hat, and sunglasses year round, whenever you are outdoors.  . Once a month, do a whole body skin exam, using a mirror to look at the skin on your back. Tell your health care provider of new moles, moles that have irregular borders, moles that are larger than a pencil eraser, or moles that have changed in shape or color.

## 2017-06-01 NOTE — Progress Notes (Signed)
I reviewed health advisor's note, was available for consultation, and agree with documentation and plan.  

## 2017-06-01 NOTE — Progress Notes (Signed)
Pre visit review using our clinic review tool, if applicable. No additional management support is needed unless otherwise documented below in the visit note. 

## 2017-06-01 NOTE — Progress Notes (Signed)
PCP notes:   Health maintenance:  Flu vaccine - declined Mammogram - ordered Bone density - ordered  Abnormal screenings:   Depression score: 2 Hearing - failed  Hearing Screening   125Hz  250Hz  500Hz  1000Hz  2000Hz  3000Hz  4000Hz  6000Hz  8000Hz   Right ear:   40 0 40  0    Left ear:   0 0 40  0     Patient concerns:   None  Nurse concerns:  None  Next PCP appt:   06/06/17 @ 1515

## 2017-06-01 NOTE — Progress Notes (Signed)
Subjective:   Heidi Mitchell is a 66 y.o. female who presents for Medicare Annual (Subsequent) preventive examination.  Review of Systems:  N/A Cardiac Risk Factors include: advanced age (>76men, >2 women);dyslipidemia     Objective:     Vitals: BP 100/60 (BP Location: Right Arm, Patient Position: Sitting, Cuff Size: Normal)   Pulse (!) 57   Temp 98 F (36.7 C) (Oral)   Ht 4' 11.75" (1.518 m) Comment: no shoes  Wt 115 lb 8 oz (52.4 kg)   LMP 11/10/2012   SpO2 96%   BMI 22.75 kg/m   Body mass index is 22.75 kg/m.   Tobacco History  Smoking Status  . Never Smoker  Smokeless Tobacco  . Never Used     Counseling given: No   Past Medical History:  Diagnosis Date  . Intracervical pessary   . Unspecified disorder of bladder   . Uterine prolapse    Past Surgical History:  Procedure Laterality Date  . APPENDECTOMY  1990  . bartholins cyst  1978   History reviewed. No pertinent family history. History  Sexual Activity  . Sexual activity: No    Outpatient Encounter Prescriptions as of 06/01/2017  Medication Sig  . estradiol (ESTRACE VAGINAL) 0.1 MG/GM vaginal cream Place 1 Applicatorful vaginally 2 (two) times a week.  . pravastatin (PRAVACHOL) 10 MG tablet TAKE 1 TABLET EVERY DAY  . [DISCONTINUED] ALPRAZolam (XANAX) 0.25 MG tablet Take 1 tablet (0.25 mg total) by mouth 2 (two) times daily as needed for anxiety.  . [DISCONTINUED] sulfamethoxazole-trimethoprim (BACTRIM DS,SEPTRA DS) 800-160 MG tablet Take 1 tablet by mouth 2 (two) times daily.   No facility-administered encounter medications on file as of 06/01/2017.     Activities of Daily Living In your present state of health, do you have any difficulty performing the following activities: 06/01/2017  Hearing? N  Vision? N  Difficulty concentrating or making decisions? N  Walking or climbing stairs? N  Dressing or bathing? N  Doing errands, shopping? N  Preparing Food and eating ? N  Using the Toilet? N   In the past six months, have you accidently leaked urine? Y  Do you have problems with loss of bowel control? N  Managing your Medications? N  Managing your Finances? N  Housekeeping or managing your Housekeeping? N  Some recent data might be hidden    Patient Care Team: Lucille Passy, MD as PCP - [facility] [place] as Consulting Physician Rubie Maid, MD as Referring Physician (Obstetrics and Gynecology)    Assessment:     Hearing Screening   125Hz  250Hz  500Hz  1000Hz  2000Hz  3000Hz  4000Hz  6000Hz  8000Hz   Right ear:   40 0 40  0    Left ear:   0 0 40  0      Visual Acuity Screening   Right eye Left eye Both eyes  Without correction:     With correction: 20/25 20/50 20/20     Exercise Activities and Dietary recommendations Current Exercise Habits: The patient does not participate in regular exercise at present, Exercise limited by: None identified  Goals    . Increase water intake          Starting 06/01/2017, I will attempt to drink at least 6-8 glasses of water daily.       Fall Risk Fall Risk  06/01/2017 03/19/2016  Falls in the past year? No No   Depression Screen PHQ 2/9 Scores 06/01/2017 03/19/2016  PHQ - 2 Score 0 0  PHQ- 9 Score 2 -     Cognitive Function MMSE - Mini Mental State Exam 06/01/2017 03/19/2016  Orientation to time 5 5  Orientation to Place 5 5  Registration 3 3  Attention/ Calculation 0 0  Recall 3 3  Language- name 2 objects 0 0  Language- repeat 1 1  Language- follow 3 step command 3 3  Language- read & follow direction 0 0  Write a sentence 0 0  Copy design 0 0  Total score 20 20       PLEASE NOTE: A Mini-Cog screen was completed. Maximum score is 20. A value of 0 denotes this part of Folstein MMSE was not completed or the patient failed this part of the Mini-Cog screening.   Mini-Cog Screening Orientation to Time - Max 5 pts Orientation to Place - Max 5 pts Registration - Max 3 pts Recall - Max 3 pts Language Repeat - Max  1 pts Language Follow 3 Step Command - Max 3 pts   Immunization History  Administered Date(s) Administered  . Td 01/18/2002   Screening Tests Health Maintenance  Topic Date Due  . INFLUENZA VACCINE  12/11/2017 (Originally 04/13/2017)  . DEXA SCAN  06/01/2018 (Originally 08/21/2014)  . MAMMOGRAM  01/19/2019 (Originally 01/19/2017)  . PNA vac Low Risk Adult (1 of 2 - PCV13) 03/19/2024 (Originally 08/21/2014)  . COLON CANCER SCREENING ANNUAL FOBT  02/03/2018  . TETANUS/TDAP  02/25/2025  . Hepatitis C Screening  Completed      Plan:     I have personally reviewed and addressed the Medicare Annual Wellness questionnaire and have noted the following in the patient's chart:  A. Medical and social history B. Use of alcohol, tobacco or illicit drugs  C. Current medications and supplements D. Functional ability and status E.  Nutritional status F.  Physical activity G. Advance directives H. List of other physicians I.  Hospitalizations, surgeries, and ER visits in previous 12 months J.  [place] to include hearing, vision, cognitive, depression L. Referrals and appointments - none  In addition, I have reviewed and discussed with patient certain preventive protocols, quality metrics, and best practice recommendations. A written personalized care plan for preventive services as well as general preventive health recommendations were provided to patient.  See attached scanned questionnaire for additional information.   Signed,   Lindell Noe, MHA, BS, LPN Health Coach

## 2017-06-06 ENCOUNTER — Encounter: Payer: Self-pay | Admitting: Family Medicine

## 2017-06-06 ENCOUNTER — Ambulatory Visit (INDEPENDENT_AMBULATORY_CARE_PROVIDER_SITE_OTHER): Payer: Medicare HMO | Admitting: Family Medicine

## 2017-06-06 VITALS — BP 124/76 | HR 47 | Temp 98.2°F | Ht 59.75 in | Wt 116.5 lb

## 2017-06-06 DIAGNOSIS — Z01411 Encounter for gynecological examination (general) (routine) with abnormal findings: Secondary | ICD-10-CM

## 2017-06-06 DIAGNOSIS — E785 Hyperlipidemia, unspecified: Secondary | ICD-10-CM | POA: Diagnosis not present

## 2017-06-06 DIAGNOSIS — R079 Chest pain, unspecified: Secondary | ICD-10-CM | POA: Diagnosis not present

## 2017-06-06 DIAGNOSIS — Z01419 Encounter for gynecological examination (general) (routine) without abnormal findings: Secondary | ICD-10-CM

## 2017-06-06 NOTE — Assessment & Plan Note (Signed)
Typical in nature- EKG relatively unchanged from prior but given typical nature of CP, concerning and will refer to cardiology for stress test. The patient indicates understanding of these issues and agrees with the plan.

## 2017-06-06 NOTE — Progress Notes (Signed)
Subjective:   Patient ID: Heidi Mitchell, female    DOB: [birth year], 66 y.o.   MRN: [id number]  Heidi Mitchell is a pleasant 66 y.o. year old female who presents to clinic today with Annual Exam  and follow up of chronic medical conditions on 06/06/2017  HPI:  Annual medicare wellness visit with Candis Musa, RN on 06/01/17- notes reviewed.  H/o pos HPV- neg colposcopy on 03/26/11 (had post menopausal bleeding-none since) Last pap smear done by me on 01/20/15- negative.  Adult vaccines due  Topic Date Due  . TETANUS/TDAP  02/25/2025    Neg IFOB 02/03/17  Continues to decline all vaccinations, including zostavax, pneumovax.  Has been having CP with DOE for 2 weeks.  Pain occurs when doing heavy lifting at work, stops at rest.  She has had this in the past but was never associated with DOE. No diaphoresis, nausea or vomiting.   HLD- much improved with low dose pravachol. Denies myalgias.  Lab Results  Component Value Date   CHOL 208 (H) 06/01/2017   HDL 77.70 06/01/2017   LDLCALC 116 (H) 06/01/2017   LDLDIRECT 166.5 03/31/2012   TRIG 70.0 06/01/2017   CHOLHDL 3 06/01/2017   Lab Results  Component Value Date   ALT 13 06/01/2017   AST 22 06/01/2017   ALKPHOS 52 06/01/2017   BILITOT 1.1 06/01/2017   Lab Results  Component Value Date   WBC 7.5 06/01/2017   HGB 13.5 06/01/2017   HCT 41.3 06/01/2017   MCV 89.3 06/01/2017   PLT 212.0 06/01/2017   Lab Results  Component Value Date   TSH 1.39 06/01/2017   Lab Results  Component Value Date   NA 139 06/01/2017   K 5.0 06/01/2017   CL 104 06/01/2017   CO2 29 06/01/2017     Current Outpatient Prescriptions on File Prior to Visit  Medication Sig Dispense Refill  . estradiol (ESTRACE VAGINAL) 0.1 MG/GM vaginal cream Place 1 Applicatorful vaginally 2 (two) times a week. 42.5 g 12  . pravastatin (PRAVACHOL) 10 MG tablet TAKE 1 TABLET EVERY DAY 90 tablet 1   No current facility-administered medications on file  prior to visit.     Allergies  Allergen Reactions  . Zantac [Ranitidine Hcl] Other (See Comments)    Muscle spasm     Past Medical History:  Diagnosis Date  . Intracervical pessary   . Unspecified disorder of bladder   . Uterine prolapse     Past Surgical History:  Procedure Laterality Date  . APPENDECTOMY  1990  . bartholins cyst  1978    No family history on file.  Social History   Social History  . Marital status: Married    Spouse name: N/A  . Number of children: 1  . Years of education: N/A   Occupational History  . Works in a [employer]  . Smoking status: Never Smoker  . Smokeless tobacco: Never Used  . Alcohol use No  . Drug use: No  . Sexual activity: No   Other Topics Concern  . Not on file   Social History Narrative   Does not have living will   Husband and son aware of wishes.   Pt would desires CPR but would not want prolonged life support if futile.   The PMH, PSH, Social History, Family History, Medications, and allergies have been reviewed in [facility], and have been updated if relevant.  Review of Systems  Constitutional: Negative.  HENT: Negative.   Eyes: Negative.   Respiratory: Positive for shortness of breath.   Cardiovascular: Positive for chest pain. Negative for palpitations and leg swelling.  Gastrointestinal: Negative.   Endocrine: Negative.   Genitourinary: Negative for decreased urine volume, pelvic pain, vaginal bleeding, vaginal discharge and vaginal pain.  Musculoskeletal: Negative.   Skin: Negative.   Allergic/Immunologic: Negative.   Neurological: Negative.   Hematological: Negative.   Psychiatric/Behavioral: Negative.   All other systems reviewed and are negative.      Objective:    BP 124/76 (BP Location: Left Arm, Patient Position: Sitting, Cuff Size: Normal)   Pulse (!) 47   Temp 98.2 F (36.8 C) (Oral)   Ht 4' 11.75" (1.518 m)   Wt 116 lb 8 oz (52.8 kg)   LMP 11/10/2012   SpO2 98%    BMI 22.94 kg/m    Physical Exam    General:  Well-developed,well-nourished,in no acute distress; alert,appropriate and cooperative throughout examination Head:  normocephalic and atraumatic.   Eyes:  vision grossly intact, PERRL Ears:  R ear normal and L ear normal externally, TMs clear bilaterally Nose:  no external deformity.   Mouth:  good dentition.   Neck:  No deformities, masses, or tenderness noted. Lungs:  Normal respiratory effort, chest expands symmetrically. Lungs are clear to auscultation, no crackles or wheezes. Heart:  Normal rate and regular rhythm. S1 and S2 normal without gallop, murmur, click, rub or other extra sounds. Abdomen:  Bowel sounds positive,abdomen soft and non-tender without masses, organomegaly or hernias noted. Msk:  No deformity or scoliosis noted of thoracic or lumbar spine.   Extremities:  No clubbing, cyanosis, edema, or deformity noted with normal full range of motion of all joints.   Neurologic:  alert & oriented X3 and gait normal.   Skin:  Intact without suspicious lesions or rashes Cervical Nodes:  No lymphadenopathy noted Axillary Nodes:  No palpable lymphadenopathy Psych:  Cognition and judgment appear intact. Alert and cooperative with normal attention span and concentration. No apparent delusions, illusions, hallucinations      Assessment & Plan:   Hyperlipidemia, unspecified hyperlipidemia type  Well woman exam  Chest pain, unspecified type No Follow-up on file.

## 2017-06-06 NOTE — Assessment & Plan Note (Signed)
Reviewed preventive care protocols, scheduled due services, and updated immunizations Discussed nutrition, exercise, diet, and healthy lifestyle.  

## 2017-06-06 NOTE — Assessment & Plan Note (Signed)
Much improved with statin. No changes made today.

## 2017-06-06 NOTE — Patient Instructions (Signed)
Great to see you.  Please stop by to see Heidi Mitchell on your way out to schedule your cardiology appointment.

## 2017-07-01 ENCOUNTER — Telehealth: Payer: Self-pay | Admitting: Family Medicine

## 2017-07-01 NOTE — Telephone Encounter (Signed)
Best number  [phone]  Pt dropped of jury summons form  She is needing a letter to be excused from jury duty.  IN Dr Deborra Medina Rx tower box up front

## 2017-07-04 ENCOUNTER — Other Ambulatory Visit: Payer: Self-pay | Admitting: Family Medicine

## 2017-07-04 ENCOUNTER — Encounter: Payer: Self-pay | Admitting: Family Medicine

## 2017-07-04 DIAGNOSIS — Z1231 Encounter for screening mammogram for malignant neoplasm of breast: Secondary | ICD-10-CM

## 2017-07-06 NOTE — Telephone Encounter (Signed)
This was already mailed to patient as requested on letter/thx dmf

## 2017-07-22 ENCOUNTER — Ambulatory Visit: Payer: Medicare HMO | Admitting: Internal Medicine

## 2017-07-22 ENCOUNTER — Encounter: Payer: Self-pay | Admitting: Internal Medicine

## 2017-07-22 DIAGNOSIS — E7800 Pure hypercholesterolemia, unspecified: Secondary | ICD-10-CM

## 2017-07-22 NOTE — Assessment & Plan Note (Signed)
She is having some muscle soreness in her biceps She is not sure if this is related to her job or not Will hold Pravastatin for 2 weeks, if myalgias improve, will try another statin If myalgias do not improve, will restart Pravastatin

## 2017-07-22 NOTE — Patient Instructions (Signed)
Fat and Cholesterol Restricted Diet Getting too much fat and cholesterol in your diet may cause health problems. Following this diet helps keep your fat and cholesterol at normal levels. This can keep you from getting sick. What types of fat should I choose?  Choose monosaturated and polyunsaturated fats. These are found in foods such as olive oil, canola oil, flaxseeds, walnuts, almonds, and seeds.  Eat more omega-3 fats. Good choices include salmon, mackerel, sardines, tuna, flaxseed oil, and ground flaxseeds.  Limit saturated fats. These are in animal products such as meats, butter, and cream. They can also be in plant products such as palm oil, palm kernel oil, and coconut oil.  Avoid foods with partially hydrogenated oils in them. These contain trans fats. Examples of foods that have trans fats are stick margarine, some tub margarines, cookies, crackers, and other baked goods. What general guidelines do I need to follow?  Check food labels. Look for the words "trans fat" and "saturated fat."  When preparing a meal: ? Fill half of your plate with vegetables and green salads. ? Fill one fourth of your plate with whole grains. Look for the word "whole" as the first word in the ingredient list. ? Fill one fourth of your plate with lean protein foods.  Eat more foods that have fiber, like apples, carrots, beans, peas, and barley.  Eat more home-cooked foods. Eat less at restaurants and buffets.  Limit or avoid alcohol.  Limit foods high in starch and sugar.  Limit fried foods.  Cook foods without frying them. Baking, boiling, grilling, and broiling are all great options.  Lose weight if you are overweight. Losing even a small amount of weight can help your overall health. It can also help prevent diseases such as diabetes and heart disease. What foods can I eat? Grains Whole grains, such as whole wheat or whole grain breads, crackers, cereals, and pasta. Unsweetened oatmeal,  bulgur, barley, quinoa, or brown rice. Corn or whole wheat flour tortillas. Vegetables Fresh or frozen vegetables (raw, steamed, roasted, or grilled). Green salads. Fruits All fresh, canned (in natural juice), or frozen fruits. Meat and Other Protein Products Ground beef (85% or leaner), grass-fed beef, or beef trimmed of fat. Skinless chicken or turkey. Ground chicken or turkey. Pork trimmed of fat. All fish and seafood. Eggs. Dried beans, peas, or lentils. Unsalted nuts or seeds. Unsalted canned or dry beans. Dairy Low-fat dairy products, such as skim or 1% milk, 2% or reduced-fat cheeses, low-fat ricotta or cottage cheese, or plain low-fat yogurt. Fats and Oils Tub margarines without trans fats. Light or reduced-fat mayonnaise and salad dressings. Avocado. Olive, canola, sesame, or safflower oils. Natural peanut or almond butter (choose ones without added sugar and oil). The items listed above may not be a complete list of recommended foods or beverages. Contact your dietitian for more options. What foods are not recommended? Grains White bread. White pasta. White rice. Cornbread. Bagels, pastries, and croissants. Crackers that contain trans fat. Vegetables White potatoes. Corn. Creamed or fried vegetables. Vegetables in a cheese sauce. Fruits Dried fruits. Canned fruit in light or heavy syrup. Fruit juice. Meat and Other Protein Products Fatty cuts of meat. Ribs, chicken wings, bacon, sausage, bologna, salami, chitterlings, fatback, hot dogs, bratwurst, and packaged luncheon meats. Liver and organ meats. Dairy Whole or 2% milk, cream, half-and-half, and cream cheese. Whole milk cheeses. Whole-fat or sweetened yogurt. Full-fat cheeses. Nondairy creamers and whipped toppings. Processed cheese, cheese spreads, or cheese curds. Sweets and Desserts Corn   syrup, sugars, honey, and molasses. Candy. Jam and jelly. Syrup. Sweetened cereals. Cookies, pies, cakes, donuts, muffins, and ice  cream. Fats and Oils Butter, stick margarine, lard, shortening, ghee, or bacon fat. Coconut, palm kernel, or palm oils. Beverages Alcohol. Sweetened drinks (such as sodas, lemonade, and fruit drinks or punches). The items listed above may not be a complete list of foods and beverages to avoid. Contact your dietitian for more information. This information is not intended to replace advice given to you by your health care provider. Make sure you discuss any questions you have with your health care provider. Document Released: 02/29/2012 Document Revised: 05/06/2016 Document Reviewed: 11/29/2013 Elsevier Interactive Patient Education  2018 Elsevier Inc.  

## 2017-07-22 NOTE — Progress Notes (Signed)
HPI  Pt presents to the clinic today to establish care and for management of the conditions listed below. She is transferring care from Dr. Deborra Medina.  HLD: Her last LDL was 116, 05/2017. She is taking Pravastatin daily as prescribed. She tries to consume a low fat diet.  Flu: never Tetanus: ? 2016 Pneumovax: never Prevnar: never Shingles Vaccine: Mammogram: scheduled 07/2017 Pap Smear: 01/2015 Bone Density: scheduled 07/2017 Colon Screening: never  Past Medical History:  Diagnosis Date  . Intracervical pessary   . Unspecified disorder of bladder   . Uterine prolapse     Current Outpatient Medications  Medication Sig Dispense Refill  . estradiol (ESTRACE VAGINAL) 0.1 MG/GM vaginal cream Place 1 Applicatorful vaginally 2 (two) times a week. 42.5 g 12  . pravastatin (PRAVACHOL) 10 MG tablet TAKE 1 TABLET EVERY DAY 90 tablet 1   No current facility-administered medications for this visit.     Allergies  Allergen Reactions  . Zantac [Ranitidine Hcl] Other (See Comments)    Muscle spasm     No family history on file.  Social History   Socioeconomic History  . Marital status: Married    Spouse name: Not on file  . Number of children: 1  . Years of education: Not on file  . Highest education level: Not on file  Social Needs  . Financial resource strain: Not on file  . Food insecurity - worry: Not on file  . Food insecurity - inability: Not on file  . Transportation needs - medical: Not on file  . Transportation needs - non-medical: Not on file  Occupational History  . Occupation: Works in a [employer]  . Smoking status: Never Smoker  . Smokeless tobacco: Never Used  Substance and Sexual Activity  . Alcohol use: No  . Drug use: No  . Sexual activity: No    Birth control/protection: None  Other Topics Concern  . Not on file  Social History Narrative   Does not have living will   Husband and son aware of wishes.   Pt would desires CPR but would not want  prolonged life support if futile.    ROS:  Constitutional: Denies fever, malaise, fatigue, headache or abrupt weight changes.  Musculoskeletal: Pt report muscle pain. Denies decrease in range of motion, difficulty with gait, or joint pain and swelling.  Neurological: Denies dizziness, difficulty with memory, difficulty with speech or problems with balance and coordination.  Psych: Denies anxiety, depression, SI/HI.  No other specific complaints in a complete review of systems (except as listed in HPI above).  PE:  BP 118/66   Pulse (!) 55   Temp 97.9 F (36.6 C) (Oral)   Wt 116 lb (52.6 kg)   LMP 11/10/2012   SpO2 98%   BMI 22.84 kg/m   Wt Readings from Last 3 Encounters:  06/06/17 116 lb 8 oz (52.8 kg)  06/01/17 115 lb 8 oz (52.4 kg)  02/21/17 117 lb 4 oz (53.2 kg)    General: Appears her stated age,  in NAD. Cardiovascular: Normal rate and rhythm. S1,S2 noted.  No murmur, rubs or gallops noted. No JVD or BLE edema.  Pulmonary/Chest: Normal effort and positive vesicular breath sounds. No respiratory distress. No wheezes, rales or ronchi noted.  Neurological: Alert and oriented.  Psychiatric: Mood and affect normal. Behavior is normal. Judgment and thought content normal.     BMET    Component Value Date/Time   NA 139 06/01/2017 1407   K 5.0  06/01/2017 1407   CL 104 06/01/2017 1407   CO2 29 06/01/2017 1407   GLUCOSE 93 06/01/2017 1407   BUN 19 06/01/2017 1407   CREATININE 0.65 06/01/2017 1407   CALCIUM 9.9 06/01/2017 1407   GFRNONAA 119.65 01/30/2010 1419    Lipid Panel     Component Value Date/Time   CHOL 208 (H) 06/01/2017 1407   TRIG 70.0 06/01/2017 1407   HDL 77.70 06/01/2017 1407   CHOLHDL 3 06/01/2017 1407   VLDL 14.0 06/01/2017 1407   LDLCALC 116 (H) 06/01/2017 1407    CBC    Component Value Date/Time   WBC 7.5 06/01/2017 1407   RBC 4.63 06/01/2017 1407   HGB 13.5 06/01/2017 1407   HCT 41.3 06/01/2017 1407   PLT 212.0 06/01/2017 1407   MCV  89.3 06/01/2017 1407   MCHC 32.6 06/01/2017 1407   RDW 15.2 06/01/2017 1407   LYMPHSABS 2.2 06/01/2017 1407   MONOABS 0.7 06/01/2017 1407   EOSABS 0.1 06/01/2017 1407   BASOSABS 0.1 06/01/2017 1407    Hgb A1C No results found for: HGBA1C   Assessment and Plan:

## 2017-07-27 ENCOUNTER — Encounter: Payer: Self-pay | Admitting: Internal Medicine

## 2017-07-27 ENCOUNTER — Ambulatory Visit: Payer: Medicare HMO | Admitting: Internal Medicine

## 2017-07-27 VITALS — BP 108/62 | HR 59 | Ht 60.0 in | Wt 117.2 lb

## 2017-07-27 DIAGNOSIS — E7800 Pure hypercholesterolemia, unspecified: Secondary | ICD-10-CM | POA: Diagnosis not present

## 2017-07-27 DIAGNOSIS — R079 Chest pain, unspecified: Secondary | ICD-10-CM

## 2017-07-27 DIAGNOSIS — R0602 Shortness of breath: Secondary | ICD-10-CM

## 2017-07-27 MED ORDER — ASPIRIN EC 81 MG PO TBEC: 81.0000 mg | DELAYED_RELEASE_TABLET | Freq: Every day | ORAL | 3 refills | Status: DC

## 2017-07-27 NOTE — Progress Notes (Signed)
New Outpatient Visit Date: 07/27/2017  Referring Provider: Lucille Passy, MD [place],  [id number]  Chief Complaint: Shortness of breath and chest pain  HPI:  Heidi Mitchell is a 66 y.o. female who is being seen today for the evaluation of shortness of breath and chest pain at the request of Dr. Deborra Medina. She has a history of hyperlipidemia.  She reports shortness of breath with activity for years, particularly when she "rushes" or gets nervous.  Occasionally, her shortness of breath will also be accompanied by mild chest heaviness, making her feel as though she is being smothered.  The chest heaviness typically lasts only for a few minutes and then resolve spontaneously.  There are no other associated symptoms.  [name] wonders if this could be related to pushing heavy objects at her job in a [employer].  She denies chest pain/dyspnea when she has been resting.  [name] reports that she was prescribed ranitidine several years ago for the same chest heaviness and had significant improvement with the medication.  She has not taken it in over a year.  [name] denies a history of prior cardiac disease and testing.  She drinks 1 caffeinated soda per day.  She was previously on pravastatin for hyperlipidemia, though this was stopped about a week ago by her PCP due to myalgias.  She notes that her myalgias have already started to improve with her statin holiday.  --------------------------------------------------------------------------------------------------  Cardiovascular History & Procedures: Cardiovascular Problems:  Chest pain  Dyspnea on exertion  Risk Factors:  Hyperlipidemia and age greater than 44  Cath/PCI:  None  CV Surgery:  None  EP Procedures and Devices:  None  Non-Invasive Evaluation(s):  None  Recent CV Pertinent Labs: Lab Results  Component Value Date   CHOL 208 (H) 06/01/2017   HDL 77.70 06/01/2017   LDLCALC 116 (H)  06/01/2017   LDLDIRECT 166.5 03/31/2012   TRIG 70.0 06/01/2017   CHOLHDL 3 06/01/2017   K 5.0 06/01/2017   BUN 19 06/01/2017   CREATININE 0.65 06/01/2017    --------------------------------------------------------------------------------------------------  Past Medical History:  Diagnosis Date  . Hyperlipidemia   . Intracervical pessary   . Unspecified disorder of bladder   . Uterine prolapse     Past Surgical History:  Procedure Laterality Date  . APPENDECTOMY  1990  . bartholins cyst  1978    Current Meds  Medication Sig  . estradiol (ESTRACE VAGINAL) 0.1 MG/GM vaginal cream Place 1 Applicatorful vaginally 2 (two) times a week.    Allergies: Sertraline hcl  Social History   Socioeconomic History  . Marital status: Married    Spouse name: Not on file  . Number of children: 1  . Years of education: Not on file  . Highest education level: Not on file  Social Needs  . Financial resource strain: Not on file  . Food insecurity - worry: Not on file  . Food insecurity - inability: Not on file  . Transportation needs - medical: Not on file  . Transportation needs - non-medical: Not on file  Occupational History  . Occupation: Works in a [employer]  . Smoking status: Never Smoker  . Smokeless tobacco: Never Used  Substance and Sexual Activity  . Alcohol use: No  . Drug use: No  . Sexual activity: No    Birth control/protection: None  Other Topics Concern  . Not on file  Social History Narrative   Does not have living will  Husband and son aware of wishes.   Pt would desires CPR but would not want prolonged life support if futile.    Family History  Problem Relation Age of Onset  . Pneumonia Mother   . Alcohol abuse Mother   . Uterine cancer Sister     Review of Systems: A 12-system review of systems was performed and was negative except as noted in the  HPI.  --------------------------------------------------------------------------------------------------  Physical Exam: BP 108/62 (BP Location: Right Arm, Patient Position: Sitting, Cuff Size: Normal)   Pulse (!) 59   Ht 5' (1.524 m)   Wt 117 lb 4 oz (53.2 kg)   LMP 11/10/2012   BMI 22.90 kg/m   General: Well-developed, well-nourished woman, seated comfortably in the exam room. HEENT: No conjunctival pallor or scleral icterus. Moist mucous membranes. OP clear. Neck: Supple without lymphadenopathy, thyromegaly, JVD, or HJR. No carotid bruit. Lungs: Normal work of breathing. Clear to auscultation bilaterally without wheezes or crackles. Heart: Regular rate and rhythm without murmurs, rubs, or gallops. Non-displaced PMI. Abd: Bowel sounds present. Soft, NT/ND without hepatosplenomegaly Ext: No lower extremity edema. Radial, PT, and DP pulses are 2+ bilaterally Skin: Warm and dry without rash. Neuro: CNIII-XII intact. Strength and fine-touch sensation intact in upper and lower extremities bilaterally. Psych: Normal mood and affect.  EKG:  NSR with LVH by voltage criteria. Otherwise, no significant abnormalities.  Lab Results  Component Value Date   WBC 7.5 06/01/2017   HGB 13.5 06/01/2017   HCT 41.3 06/01/2017   MCV 89.3 06/01/2017   PLT 212.0 06/01/2017    Lab Results  Component Value Date   NA 139 06/01/2017   K 5.0 06/01/2017   CL 104 06/01/2017   CO2 29 06/01/2017   BUN 19 06/01/2017   CREATININE 0.65 06/01/2017   GLUCOSE 93 06/01/2017   ALT 13 06/01/2017    Lab Results  Component Value Date   CHOL 208 (H) 06/01/2017   HDL 77.70 06/01/2017   LDLCALC 116 (H) 06/01/2017   LDLDIRECT 166.5 03/31/2012   TRIG 70.0 06/01/2017   CHOLHDL 3 06/01/2017   --------------------------------------------------------------------------------------------------  ASSESSMENT AND PLAN: Chest pain and shortness of breath Though [name] does not have many cardiac risk factors,  her symptoms are concerning for angina.  Her EKG today demonstrates mild LVH by voltage criteria but otherwise no significant abnormalities.  We have agreed to obtain a transthoracic echocardiogram and exercise tolerance test for further evaluation.  Pending these tests, I have recommended that [name] begin taking aspirin 81 mg daily.  Given improvement in her chest heaviness in the past with ranitidine, GERD is also a possibility.  Hyperlipidemia Ms. Fretwell is currently on a statin holiday with some improvement in her myalgias after stopping low-dose pravastatin.  If there is evidence of atherosclerotic cardiovascular disease on the aforementioned testing, I would advocate for a trial of an alternative statin.  Follow-up: Return to clinic based on results of aforementioned testing.  Nelva Bush, MD 07/29/2017 1:49 PM

## 2017-07-27 NOTE — Patient Instructions (Signed)
Medication Instructions:  Your physician has recommended you make the following change in your medication:  1- START Aspirin 81 mg by mouth once a day.   Labwork: none  Testing/Procedures: Your physician has requested that you have an echocardiogram. Echocardiography is a painless test that uses sound waves to create images of your heart. It provides your doctor with information about the size and shape of your heart and how well your heart's chambers and valves are working. This procedure takes approximately one hour. There are no restrictions for this procedure.   Your physician has requested that you have an exercise tolerance test. For further information please visit [email or web address]. Please also follow instruction sheet, as given.    Follow-Up: Your physician recommends that you schedule a follow-up appointment in: pending results.   Exercise Stress Electrocardiogram An exercise stress electrocardiogram is a test that is done to evaluate the blood supply to your heart. This test may also be called exercise stress electrocardiography. The test is done while you are walking on a treadmill. The goal of this test is to raise your heart rate. This test is done to find areas of poor blood flow to the heart by determining the extent of coronary artery disease (CAD). CAD is defined as narrowing in one or more heart (coronary) arteries of more than 70%. If you have an abnormal test result, this may mean that you are not getting adequate blood flow to your heart during exercise. Additional testing may be needed to understand why your test was abnormal. Tell a health care provider about:  Any allergies you have.  All medicines you are taking, including vitamins, herbs, eye drops, creams, and over-the-counter medicines.  Any problems you or family members have had with anesthetic medicines.  Any blood disorders you have.  Any surgeries you have had.  Any medical conditions you  have.  Possibility of pregnancy, if this applies. What are the risks? Generally, this is a safe procedure. However, as with any procedure, complications can occur. Possible complications can include:  Pain or pressure in the following areas: ? Chest. ? Jaw or neck. ? Between your shoulder blades. ? Radiating down your left arm.  Dizziness or light-headedness.  Shortness of breath.  Increased or irregular heartbeats.  Nausea or vomiting.  Heart attack (rare).  What happens before the procedure?  Avoid all forms of caffeine 24 hours before your test or as directed by your health care provider. This includes coffee, tea (even decaffeinated tea), caffeinated sodas, chocolate, cocoa, and certain pain medicines.  Follow your health care provider's instructions regarding eating and drinking before the test.  Take your medicines as directed at regular times with water unless instructed otherwise. Exceptions may include: ? If you have diabetes, ask how you are to take your insulin or pills. It is common to adjust insulin dosing the morning of the test. ? If you are taking beta-blocker medicines, it is important to talk to your health care provider about these medicines well before the date of your test. Taking beta-blocker medicines may interfere with the test. In some cases, these medicines need to be changed or stopped 24 hours or more before the test. ? If you wear a nitroglycerin patch, it may need to be removed prior to the test. Ask your health care provider if the patch should be removed before the test.  If you use an inhaler for any breathing condition, bring it with you to the test.  If you are an outpatient,  bring a snack so you can eat right after the stress phase of the test.  Do not smoke for 4 hours prior to the test or as directed by your health care provider.  Do not apply lotions, powders, creams, or oils on your chest prior to the test.  Wear loose-fitting clothes  and comfortable shoes for the test. This test involves walking on a treadmill. What happens during the procedure?  Multiple patches (electrodes) will be put on your chest. If needed, small areas of your chest may have to be shaved to get better contact with the electrodes. Once the electrodes are attached to your body, multiple wires will be attached to the electrodes and your heart rate will be monitored.  Your heart will be monitored both at rest and while exercising.  You will walk on a treadmill. The treadmill will be started at a slow pace. The treadmill speed and incline will gradually be increased to raise your heart rate. What happens after the procedure?  Your heart rate and blood pressure will be monitored after the test.  You may return to your normal schedule including diet, activities, and medicines, unless your health care provider tells you otherwise. This information is not intended to replace advice given to you by your health care provider. Make sure you discuss any questions you have with your health care provider. Document Released: 08/27/2000 Document Revised: 02/05/2016 Document Reviewed: 05/07/2013 Elsevier Interactive Patient Education  2017 [place].     Echocardiogram An echocardiogram, or echocardiography, uses sound waves (ultrasound) to produce an image of your heart. The echocardiogram is simple, painless, obtained within a short period of time, and offers valuable information to your health care provider. The images from an echocardiogram can provide information such as:  Evidence of coronary artery disease (CAD).  Heart size.  Heart muscle function.  Heart valve function.  Aneurysm detection.  Evidence of a past heart attack.  Fluid buildup around the heart.  Heart muscle thickening.  Assess heart valve function.  Tell a health care provider about:  Any allergies you have.  All medicines you are taking, including vitamins, herbs, eye  drops, creams, and over-the-counter medicines.  Any problems you or family members have had with anesthetic medicines.  Any blood disorders you have.  Any surgeries you have had.  Any medical conditions you have.  Whether you are pregnant or may be pregnant. What happens before the procedure? No special preparation is needed. Eat and drink normally. What happens during the procedure?  In order to produce an image of your heart, gel will be applied to your chest and a wand-like tool (transducer) will be moved over your chest. The gel will help transmit the sound waves from the transducer. The sound waves will harmlessly bounce off your heart to allow the heart images to be captured in real-time motion. These images will then be recorded.  You may need an IV to receive a medicine that improves the quality of the pictures. What happens after the procedure? You may return to your normal schedule including diet, activities, and medicines, unless your health care provider tells you otherwise. This information is not intended to replace advice given to you by your health care provider. Make sure you discuss any questions you have with your health care provider. Document Released: 08/27/2000 Document Revised: 04/17/2016 Document Reviewed: 05/07/2013 Elsevier Interactive Patient Education  2017 [employer].

## 2017-07-29 ENCOUNTER — Encounter: Payer: Self-pay | Admitting: Internal Medicine

## 2017-07-29 DIAGNOSIS — E7800 Pure hypercholesterolemia, unspecified: Secondary | ICD-10-CM | POA: Insufficient documentation

## 2017-07-29 DIAGNOSIS — R0602 Shortness of breath: Secondary | ICD-10-CM | POA: Insufficient documentation

## 2017-07-29 DIAGNOSIS — E785 Hyperlipidemia, unspecified: Secondary | ICD-10-CM | POA: Insufficient documentation

## 2017-08-08 ENCOUNTER — Telehealth: Payer: Self-pay

## 2017-08-08 NOTE — Telephone Encounter (Signed)
Copied from [place]. Topic: Inquiry >> Aug 08, 2017  4:48 PM Conception Chancy, NT wrote: Reason for CRM:pt states she was supposed to call regina baity and let her know how she is doing. pt states she is doing better her arms do not hurt as much but her arms still hurt when she reaches up or if she puts a jacket on and reaches behind her.

## 2017-08-09 ENCOUNTER — Ambulatory Visit
Admission: RE | Admit: 2017-08-09 | Discharge: 2017-08-09 | Disposition: A | Discharge status: Home / Self Care | Payer: Medicare HMO | Source: Ambulatory Visit | Attending: Family Medicine | Admitting: Family Medicine

## 2017-08-09 DIAGNOSIS — Z1231 Encounter for screening mammogram for malignant neoplasm of breast: Secondary | ICD-10-CM | POA: Diagnosis not present

## 2017-08-09 DIAGNOSIS — E2839 Other primary ovarian failure: Secondary | ICD-10-CM

## 2017-08-09 DIAGNOSIS — M8589 Other specified disorders of bone density and structure, multiple sites: Secondary | ICD-10-CM | POA: Diagnosis not present

## 2017-08-09 DIAGNOSIS — Z780 Asymptomatic menopausal state: Secondary | ICD-10-CM | POA: Diagnosis not present

## 2017-08-09 NOTE — Telephone Encounter (Signed)
Pt is aware as instructed and states she will call back to schedule an appt

## 2017-08-09 NOTE — Telephone Encounter (Signed)
This does not sound like it is statin related. I would advise her to restart Pravastatin. She can make a follow up appt to discuss bilateral arm pain.

## 2017-08-18 ENCOUNTER — Telehealth: Payer: Self-pay | Admitting: Internal Medicine

## 2017-08-18 NOTE — Telephone Encounter (Signed)
Reviewed GXT instructions w/pt who verbalized understanding.

## 2017-08-19 ENCOUNTER — Other Ambulatory Visit: Payer: Self-pay

## 2017-08-19 ENCOUNTER — Ambulatory Visit (INDEPENDENT_AMBULATORY_CARE_PROVIDER_SITE_OTHER): Payer: Medicare HMO

## 2017-08-19 DIAGNOSIS — R079 Chest pain, unspecified: Secondary | ICD-10-CM

## 2017-08-19 DIAGNOSIS — R0602 Shortness of breath: Secondary | ICD-10-CM | POA: Diagnosis not present

## 2017-08-21 LAB — EXERCISE TOLERANCE TEST
CSEPED: 5 min
CSEPHR: 94 %
CSEPPHR: 144 {beats}/min
Estimated workload: 7 METS
Exercise duration (sec): 35 s
MPHR: 153 {beats}/min
Rest HR: 54 {beats}/min

## 2018-02-08 ENCOUNTER — Other Ambulatory Visit: Payer: Self-pay | Admitting: Family Medicine

## 2018-02-08 NOTE — Telephone Encounter (Signed)
Copied from [place] [id number]. Topic: Quick Communication - Rx Refill/Question >> Feb 08, 2018  2:02 PM Yvette Rack wrote: Medication: pravastatin (PRAVACHOL) 10 MG tablet  Has the patient contacted their pharmacy? Yes.   (Agent: If no, request that the patient contact the pharmacy for the refill.) (Agent: If yes, when and what did the pharmacy advise?)  Preferred Pharmacy (with phone number or street name): [facility], [place] [phone] (Phone) [phone] (Fax)      Agent: Please be advised that RX refills may take up to 3 business days. We ask that you follow-up with your pharmacy.

## 2018-02-08 NOTE — Telephone Encounter (Signed)
Pravastatin refill. Last filled by Dr. Deborra Medina Last Refill:12/28/16 #90 with 1 refill Last OV: 07/22/17 Next OV: 06/08/18 PCP: [facility]

## 2018-02-09 NOTE — Telephone Encounter (Signed)
I called pt to let her know I would refill pravastatin from the instructions in the 08/08/17 phone note. Pt said she never restarted the pravastatin. Pt said the arm pain is just occasional now when she turns her arm a certain way and pt thought her cholesterol labs were a lot better when she was on med and that is why pt has decided to restart the pravastatin. Since it has been several months will send to Avie Echevaria NP for approval to restart the pravastatin. [facility] pharmacy.

## 2018-02-10 MED ORDER — PRAVASTATIN SODIUM 10 MG PO TABS: 10.0000 mg | ORAL_TABLET | Freq: Every day | ORAL | 1 refills | Status: DC

## 2018-03-14 ENCOUNTER — Telehealth: Payer: Self-pay | Admitting: Internal Medicine

## 2018-03-14 MED ORDER — ATORVASTATIN CALCIUM 10 MG PO TABS: 10.0000 mg | ORAL_TABLET | Freq: Every day | ORAL | 2 refills | Status: DC

## 2018-03-14 NOTE — Telephone Encounter (Signed)
Copied from [place] [id number]. Topic: Quick Communication - See Telephone Encounter >> Mar 14, 2018 11:31 AM Bea Graff, NT wrote: CRM for notification. See Telephone encounter for: 03/14/18. Pt would like to know if it is safe for her to stop the pravastatin (PRAVACHOL) 10 MG tablet or if she should ween off the medication. Please advise.

## 2018-03-14 NOTE — Telephone Encounter (Signed)
Atorvastatin send to pharmacy. Ok to stop pravastatin and start atorvastatin.

## 2018-03-14 NOTE — Addendum Note (Signed)
Addended by: Jearld Fenton on: 03/14/2018 06:01 PM   Modules accepted: Orders

## 2018-03-14 NOTE — Telephone Encounter (Signed)
Left message to call and discuss her Pravachol.

## 2018-03-14 NOTE — Telephone Encounter (Signed)
Pt reports she had stopped the Pravastatin for about 5 months, restarted 4-5 weeks ago and myalgia has restarted... Pt is okay with trying another Statin--- please send 30 days to [place]... Pt has also tried Simvastatin in the past

## 2018-03-23 ENCOUNTER — Encounter: Payer: Self-pay | Admitting: Internal Medicine

## 2018-03-23 ENCOUNTER — Ambulatory Visit (INDEPENDENT_AMBULATORY_CARE_PROVIDER_SITE_OTHER): Payer: Medicare HMO | Admitting: Internal Medicine

## 2018-03-23 VITALS — BP 110/68 | HR 52 | Temp 97.8°F | Wt 123.0 lb

## 2018-03-23 DIAGNOSIS — M25561 Pain in right knee: Secondary | ICD-10-CM

## 2018-03-23 DIAGNOSIS — M79602 Pain in left arm: Secondary | ICD-10-CM | POA: Diagnosis not present

## 2018-03-23 MED ORDER — NAPROXEN 500 MG PO TABS: 500.0000 mg | ORAL_TABLET | Freq: Two times a day (BID) | ORAL | 0 refills | Status: DC

## 2018-03-23 NOTE — Patient Instructions (Signed)

## 2018-03-23 NOTE — Progress Notes (Signed)
Subjective:    Patient ID: Heidi Mitchell, female    DOB: 08/03/1951, 67 y.o.   MRN: [id number]  HPI  Pt presents to the clinic today with c/o left arm pain. This started 6 months ago. The pain is intermittent. She describes the pain as achy and pulling. She does have pain with certain movements. She denies numbness, tingling or weakness. She denies any injury to the area, but does work in a factory with repetitive motions. She has tried Tylenol with minimal relief.  She also c/o right knee pain. This started 2-3 months ago. She describes the pain as achy and cramping. The pain radiates into the calf. She denies numbness, tingling or weakness. She denies any injury to the area but has recently started walking. She has tried Tylenol with minimal relief.   Review of Systems      Past Medical History:  Diagnosis Date  . Hyperlipidemia   . Intracervical pessary   . Unspecified disorder of bladder   . Uterine prolapse     Current Outpatient Medications  Medication Sig Dispense Refill  . atorvastatin (LIPITOR) 10 MG tablet Take 1 tablet (10 mg total) by mouth daily. 30 tablet 2  . estradiol (ESTRACE VAGINAL) 0.1 MG/GM vaginal cream Place 1 Applicatorful vaginally 2 (two) times a week. 42.5 g 12   No current facility-administered medications for this visit.     Allergies  Allergen Reactions  . Sertraline Hcl     Muscle spasms    Family History  Problem Relation Age of Onset  . Pneumonia Mother   . Alcohol abuse Mother   . Uterine cancer Sister     Social History   Socioeconomic History  . Marital status: Married    Spouse name: Not on file  . Number of children: 1  . Years of education: Not on file  . Highest education level: Not on file  Occupational History  . Occupation: Works in a [employer]  . Financial resource strain: Not on file  . Food insecurity:    Worry: Not on file    Inability: Not on file  . Transportation needs:    Medical: Not on file    Non-medical: Not on file  Tobacco Use  . Smoking status: Never Smoker  . Smokeless tobacco: Never Used  Substance and Sexual Activity  . Alcohol use: No  . Drug use: No  . Sexual activity: Never    Birth control/protection: None  Lifestyle  . Physical activity:    Days per week: Not on file    Minutes per session: Not on file  . Stress: Not on file  Relationships  . Social connections:    Talks on phone: Not on file    Gets together: Not on file    Attends religious service: Not on file    Active member of club or organization: Not on file    Attends meetings of clubs or organizations: Not on file    Relationship status: Not on file  . Intimate partner violence:    Fear of current or ex partner: Not on file    Emotionally abused: Not on file    Physically abused: Not on file    Forced sexual activity: Not on file  Other Topics Concern  . Not on file  Social History Narrative   Does not have living will   Husband and son aware of wishes.   Pt would desires CPR but would not want prolonged  life support if futile.     Constitutional: Denies fever, malaise, fatigue, headache or abrupt weight changes.  Musculoskeletal: Pt reports left arm pain, right knee pain. Denies decrease in range of motion, difficulty with gait, muscle pain or joint swelling.  Neurological: Denies dizziness, difficulty with memory, difficulty with speech or problems with balance and coordination.   No other specific complaints in a complete review of systems (except as listed in HPI above).  Objective:   Physical Exam   BP 110/68   Pulse (!) 52   Temp 97.8 F (36.6 C) (Oral)   Wt 123 lb (55.8 kg)   LMP 11/10/2012   SpO2 98%   BMI 24.02 kg/m  Wt Readings from Last 3 Encounters:  03/23/18 123 lb (55.8 kg)  07/27/17 117 lb 4 oz (53.2 kg)  07/22/17 116 lb (52.6 kg)    General: Appears her stated age, well developed, well nourished in NAD. Musculoskeletal: Normal internal rotation, external  rotation of the left shoulder. No pain with palpation of the left shoulder. Strength 5/5 BUE. Hand grips equal. Positive drop can on the left. Normal flexion and extension of the right knee. No pain with palpation of the knee. Strength 5/5 BLE. No difficulty with gait.  Neurological: Alert and oriented. Sensation intact to upper and lower extremities.   BMET    Component Value Date/Time   NA 139 06/01/2017 1407   K 5.0 06/01/2017 1407   CL 104 06/01/2017 1407   CO2 29 06/01/2017 1407   GLUCOSE 93 06/01/2017 1407   BUN 19 06/01/2017 1407   CREATININE 0.65 06/01/2017 1407   CALCIUM 9.9 06/01/2017 1407   GFRNONAA 119.65 01/30/2010 1419    Lipid Panel     Component Value Date/Time   CHOL 208 (H) 06/01/2017 1407   TRIG 70.0 06/01/2017 1407   HDL 77.70 06/01/2017 1407   CHOLHDL 3 06/01/2017 1407   VLDL 14.0 06/01/2017 1407   LDLCALC 116 (H) 06/01/2017 1407    CBC    Component Value Date/Time   WBC 7.5 06/01/2017 1407   RBC 4.63 06/01/2017 1407   HGB 13.5 06/01/2017 1407   HCT 41.3 06/01/2017 1407   PLT 212.0 06/01/2017 1407   MCV 89.3 06/01/2017 1407   MCHC 32.6 06/01/2017 1407   RDW 15.2 06/01/2017 1407   LYMPHSABS 2.2 06/01/2017 1407   MONOABS 0.7 06/01/2017 1407   EOSABS 0.1 06/01/2017 1407   BASOSABS 0.1 06/01/2017 1407    Hgb A1C No results found for: HGBA1C         Assessment & Plan:   Left Arm Pain, Right Knee Pain:  Exam benign eRx for Naproxen 500 mg BID with food Wear knee brace while walking Alternate heat and ice If persist or worsens, consider Prednisone, Xray and PT  Return precautions discussed Webb Silversmith, NP

## 2018-04-18 ENCOUNTER — Telehealth: Payer: Self-pay | Admitting: Internal Medicine

## 2018-04-18 NOTE — Telephone Encounter (Signed)
Copied from [place] [id number]. Topic: Quick Communication - See Telephone Encounter >> Apr 18, 2018 11:44 AM Burchel, Abbi R wrote: CRM for notification. See Telephone encounter for: 04/18/18.  Pt would like to repeat her cologuard screening.  If pt needs appt, please call to advise.  Pt: [phone]

## 2018-04-18 NOTE — Telephone Encounter (Signed)
This can be ordered at her annual wellness appt

## 2018-06-08 ENCOUNTER — Encounter: Payer: Medicare HMO | Admitting: Internal Medicine

## 2018-07-13 ENCOUNTER — Ambulatory Visit (INDEPENDENT_AMBULATORY_CARE_PROVIDER_SITE_OTHER): Payer: Medicare HMO | Admitting: Internal Medicine

## 2018-07-13 ENCOUNTER — Encounter: Payer: Self-pay | Admitting: Internal Medicine

## 2018-07-13 VITALS — BP 114/68 | HR 62 | Temp 97.9°F | Ht 60.0 in | Wt 127.0 lb

## 2018-07-13 DIAGNOSIS — Z0000 Encounter for general adult medical examination without abnormal findings: Secondary | ICD-10-CM | POA: Diagnosis not present

## 2018-07-13 DIAGNOSIS — E559 Vitamin D deficiency, unspecified: Secondary | ICD-10-CM | POA: Diagnosis not present

## 2018-07-13 DIAGNOSIS — E7800 Pure hypercholesterolemia, unspecified: Secondary | ICD-10-CM | POA: Diagnosis not present

## 2018-07-13 LAB — COMPREHENSIVE METABOLIC PANEL
ALT: 13 U/L (ref 0–35)
AST: 18 U/L (ref 0–37)
Albumin: 4.3 g/dL (ref 3.5–5.2)
Alkaline Phosphatase: 62 U/L (ref 39–117)
BUN: 17 mg/dL (ref 6–23)
CO2: 29 mEq/L (ref 19–32)
Calcium: 9.2 mg/dL (ref 8.4–10.5)
Chloride: 106 mEq/L (ref 96–112)
Creatinine, Ser: 0.66 mg/dL (ref 0.40–1.20)
GFR: 94.41 mL/min (ref >60.00)
Glucose, Bld: 102 mg/dL — ABNORMAL HIGH (ref 70–99)
POTASSIUM: 4.4 meq/L (ref 3.5–5.1)
SODIUM: 140 meq/L (ref 135–145)
TOTAL PROTEIN: 6.7 g/dL (ref 6.0–8.3)
Total Bilirubin: 1.2 mg/dL (ref 0.2–1.2)

## 2018-07-13 LAB — VITAMIN D 25 HYDROXY (VIT D DEFICIENCY, FRACTURES): VITD: 28.92 ng/mL — AB (ref 30.00–100.00)

## 2018-07-13 LAB — LIPID PANEL
Cholesterol: 155 mg/dL (ref 0–200)
HDL: 57.2 mg/dL (ref >39.00)
LDL Cholesterol: 84 mg/dL (ref 0–99)
NONHDL: 97.96
Total CHOL/HDL Ratio: 3
Triglycerides: 69 mg/dL (ref 0.0–149.0)
VLDL: 13.8 mg/dL (ref 0.0–40.0)

## 2018-07-13 LAB — CBC
HCT: 38.9 % (ref 36.0–46.0)
Hemoglobin: 12.9 g/dL (ref 12.0–15.0)
MCHC: 33.1 g/dL (ref 30.0–36.0)
MCV: 86.8 fl (ref 78.0–100.0)
PLATELETS: 193 10*3/uL (ref 150.0–400.0)
RBC: 4.48 Mil/uL (ref 3.87–5.11)
RDW: 15.3 % (ref 11.5–15.5)
WBC: 7.2 10*3/uL (ref 4.0–10.5)

## 2018-07-13 NOTE — Progress Notes (Signed)
HPI:  Pt presents to the clinic today for her Medicare Wellness Exam.   HLD: Her last LDL was 116, 05/2017. She denies myalgias on Atorvastatin. She tries to consume a low fat diet.  Past Medical History:  Diagnosis Date  . Hyperlipidemia   . Intracervical pessary   . Unspecified disorder of bladder   . Uterine prolapse     Current Outpatient Medications  Medication Sig Dispense Refill  . atorvastatin (LIPITOR) 10 MG tablet Take 1 tablet (10 mg total) by mouth daily. 30 tablet 2  . estradiol (ESTRACE VAGINAL) 0.1 MG/GM vaginal cream Place 1 Applicatorful vaginally 2 (two) times a week. 42.5 g 12  . naproxen (NAPROSYN) 500 MG tablet Take 1 tablet (500 mg total) by mouth 2 (two) times daily with a meal. 30 tablet 0   No current facility-administered medications for this visit.     Allergies  Allergen Reactions  . Sertraline Hcl     Muscle spasms    Family History  Problem Relation Age of Onset  . Pneumonia Mother   . Alcohol abuse Mother   . Uterine cancer Sister     Social History   Socioeconomic History  . Marital status: Married    Spouse name: Not on file  . Number of children: 1  . Years of education: Not on file  . Highest education level: Not on file  Occupational History  . Occupation: Works in a [employer]  . Financial resource strain: Not on file  . Food insecurity:    Worry: Not on file    Inability: Not on file  . Transportation needs:    Medical: Not on file    Non-medical: Not on file  Tobacco Use  . Smoking status: Never Smoker  . Smokeless tobacco: Never Used  Substance and Sexual Activity  . Alcohol use: No  . Drug use: No  . Sexual activity: Never    Birth control/protection: None  Lifestyle  . Physical activity:    Days per week: Not on file    Minutes per session: Not on file  . Stress: Not on file  Relationships  . Social connections:    Talks on phone: Not on file    Gets together: Not on file    Attends religious  service: Not on file    Active member of club or organization: Not on file    Attends meetings of clubs or organizations: Not on file    Relationship status: Not on file  . Intimate partner violence:    Fear of current or ex partner: Not on file    Emotionally abused: Not on file    Physically abused: Not on file    Forced sexual activity: Not on file  Other Topics Concern  . Not on file  Social History Narrative   Does not have living will   Husband and son aware of wishes.   Pt would desires CPR but would not want prolonged life support if futile.    Hospitiliaztions: None  Health Maintenance:    Flu: never  Tetanus: ? 2016  Pneumovax: never  Prevnar: never  Shingrix: never  Mammogram: 07/2017  Pap Smear: 01/2015  Bone Density: 07/2017  Colon Screening: never  Eye Doctor: every 1-2 years  Dental Exam: annually   Providers:   PCP: Britta Mccreedy, NP-C    I have personally reviewed and have noted:  1. The patient's medical and social history 2. Their use of alcohol, tobacco  or illicit drugs 3. Their current medications and supplements 4. The patient's functional ability including ADL's, fall risks, home safety risks and hearing or visual impairment. 5. Diet and physical activities 6. Evidence for depression or mood disorder  Subjective:   Review of Systems:   Constitutional: Denies fever, malaise, fatigue, headache or abrupt weight changes.  HEENT: Denies eye pain, eye redness, ear pain, ringing in the ears, wax buildup, runny nose, nasal congestion, bloody nose, or sore throat. Respiratory: Pt reports SOB with talking. Denies difficulty breathing, cough or sputum production.   Cardiovascular: Denies chest pain, chest tightness, palpitations or swelling in the hands or feet.  Gastrointestinal: Pt reports intermittent constipation. Denies abdominal pain, bloating, diarrhea or blood in the stool.  GU: Denies urgency, frequency, pain with urination, burning  sensation, blood in urine, odor or discharge. Musculoskeletal: Pt reports left arm pain. Denies decrease in range of motion, difficulty with gait, or joint pain and swelling.  Skin: Denies redness, rashes, lesions or ulcercations.  Neurological: Denies dizziness, difficulty with memory, difficulty with speech or problems with balance and coordination.  Psych: Denies anxiety, depression, SI/HI.  No other specific complaints in a complete review of systems (except as listed in HPI above).  Objective:  PE:   BP 114/68   Pulse 62   Temp 97.9 F (36.6 C) (Oral)   Ht 5' (1.524 m)   Wt 127 lb (57.6 kg)   LMP 11/10/2012   SpO2 98%   BMI 24.80 kg/m   Wt Readings from Last 3 Encounters:  03/23/18 123 lb (55.8 kg)  07/27/17 117 lb 4 oz (53.2 kg)  07/22/17 116 lb (52.6 kg)    General: Appears her stated age, well developed, well nourished in NAD. Skin: Warm, dry and intact.  HEENT: Head: normal shape and size; Eyes: sclera white, no icterus, conjunctiva pink, PERRLA and EOMs intact; Ears: Tm's gray and intact, normal light reflex; Throat/Mouth: Teeth present, mucosa pink and moist, no exudate, lesions or ulcerations noted.  Neck: Neck supple, trachea midline. No masses, lumps or thyromegaly present.  Cardiovascular: Normal rate and rhythm. S1,S2 noted.  No murmur, rubs or gallops noted. No JVD or BLE edema. No carotid bruits noted. Pulmonary/Chest: Normal effort and positive vesicular breath sounds. No respiratory distress. No wheezes, rales or ronchi noted.  Abdomen: Soft and nontender. Normal bowel sounds. No distention or masses noted. Liver, spleen and kidneys non palpable. Musculoskeletal: Strength 5/5 BUE/BLE. No signs of joint swelling.  Neurological: Alert and oriented. Cranial nerves II-XII grossly intact. Coordination normal.  Psychiatric: Mood and affect normal. Behavior is normal. Judgment and thought content normal.    BMET    Component Value Date/Time   NA 139 06/01/2017  1407   K 5.0 06/01/2017 1407   CL 104 06/01/2017 1407   CO2 29 06/01/2017 1407   GLUCOSE 93 06/01/2017 1407   BUN 19 06/01/2017 1407   CREATININE 0.65 06/01/2017 1407   CALCIUM 9.9 06/01/2017 1407   GFRNONAA 119.65 01/30/2010 1419    Lipid Panel     Component Value Date/Time   CHOL 208 (H) 06/01/2017 1407   TRIG 70.0 06/01/2017 1407   HDL 77.70 06/01/2017 1407   CHOLHDL 3 06/01/2017 1407   VLDL 14.0 06/01/2017 1407   LDLCALC 116 (H) 06/01/2017 1407    CBC    Component Value Date/Time   WBC 7.5 06/01/2017 1407   RBC 4.63 06/01/2017 1407   HGB 13.5 06/01/2017 1407   HCT 41.3 06/01/2017 1407  PLT 212.0 06/01/2017 1407   MCV 89.3 06/01/2017 1407   MCHC 32.6 06/01/2017 1407   RDW 15.2 06/01/2017 1407   LYMPHSABS 2.2 06/01/2017 1407   MONOABS 0.7 06/01/2017 1407   EOSABS 0.1 06/01/2017 1407   BASOSABS 0.1 06/01/2017 1407    Hgb A1C No results found for: HGBA1C    Assessment and Plan:   Medicare Annual Wellness Visit:  Diet: She does eat meat. She consumes more veggies than fruits. She does eat fried foods. She drinks mostly soda, water. Physical activity: Walk Depression/mood screen: Negative Hearing: Intact to whispered voice Visual acuity: Grossly normal ADLs: Capable Fall risk: None Home safety: Good Cognitive evaluation: Intact to orientation, naming, recall and repetition EOL planning: No adv directives, full code/ I agree  Preventative Medicine: She declines flu, prevnar, pneumovax and shingrix. Tetanus UTD. She will call to schedule her mammogram. Pap smear not indicated. Bone density UTD. She declines colonoscopy but is agreeable to Cologuard- ordered. Encouraged her to consume a balanced diet and exercise regimen. Advised her to see an eye doctor and dentist annually. Will check CBC, CMET, Lipid and Vit D today.   Next appointment: [place], NP

## 2018-07-13 NOTE — Assessment & Plan Note (Signed)
CBC, CMET and Lipid profile today Encouraged him to consume a low fat diet She will need refill of Atorvastatin once labs are back

## 2018-07-13 NOTE — Patient Instructions (Signed)
Health Maintenance for Postmenopausal Women Menopause is a normal process in which your reproductive ability comes to an end. This process happens gradually over a span of months to years, usually between the ages of 22 and 9. Menopause is complete when you have missed 12 consecutive menstrual periods. It is important to talk with your health care provider about some of the most common conditions that affect postmenopausal women, such as heart disease, cancer, and bone loss (osteoporosis). Adopting a healthy lifestyle and getting preventive care can help to promote your health and wellness. Those actions can also lower your chances of developing some of these common conditions. What should I know about menopause? During menopause, you may experience a number of symptoms, such as:  Moderate-to-severe hot flashes.  Night sweats.  Decrease in sex drive.  Mood swings.  Headaches.  Tiredness.  Irritability.  Memory problems.  Insomnia.  Choosing to treat or not to treat menopausal changes is an individual decision that you make with your health care provider. What should I know about hormone replacement therapy and supplements? Hormone therapy products are effective for treating symptoms that are associated with menopause, such as hot flashes and night sweats. Hormone replacement carries certain risks, especially as you become older. If you are thinking about using estrogen or estrogen with progestin treatments, discuss the benefits and risks with your health care provider. What should I know about heart disease and stroke? Heart disease, heart attack, and stroke become more likely as you age. This may be due, in part, to the hormonal changes that your body experiences during menopause. These can affect how your body processes dietary fats, triglycerides, and cholesterol. Heart attack and stroke are both medical emergencies. There are many things that you can do to help prevent heart disease  and stroke:  Have your blood pressure checked at least every 1-2 years. High blood pressure causes heart disease and increases the risk of stroke.  If you are 53-22 years old, ask your health care provider if you should take aspirin to prevent a heart attack or a stroke.  Do not use any tobacco products, including cigarettes, chewing tobacco, or electronic cigarettes. If you need help quitting, ask your health care provider.  It is important to eat a healthy diet and maintain a healthy weight. ? Be sure to include plenty of vegetables, fruits, low-fat dairy products, and lean protein. ? Avoid eating foods that are high in solid fats, added sugars, or salt (sodium).  Get regular exercise. This is one of the most important things that you can do for your health. ? Try to exercise for at least 150 minutes each week. The type of exercise that you do should increase your heart rate and make you sweat. This is known as moderate-intensity exercise. ? Try to do strengthening exercises at least twice each week. Do these in addition to the moderate-intensity exercise.  Know your numbers.Ask your health care provider to check your cholesterol and your blood glucose. Continue to have your blood tested as directed by your health care provider.  What should I know about cancer screening? There are several types of cancer. Take the following steps to reduce your risk and to catch any cancer development as early as possible. Breast Cancer  Practice breast self-awareness. ? This means understanding how your breasts normally appear and feel. ? It also means doing regular breast self-exams. Let your health care provider know about any changes, no matter how small.  If you are 40  or older, have a clinician do a breast exam (clinical breast exam or CBE) every year. Depending on your age, family history, and medical history, it may be recommended that you also have a yearly breast X-ray (mammogram).  If you  have a family history of breast cancer, talk with your health care provider about genetic screening.  If you are at high risk for breast cancer, talk with your health care provider about having an MRI and a mammogram every year.  Breast cancer (BRCA) gene test is recommended for women who have family members with BRCA-related cancers. Results of the assessment will determine the need for genetic counseling and BRCA1 and for BRCA2 testing. BRCA-related cancers include these types: ? Breast. This occurs in males or females. ? Ovarian. ? Tubal. This may also be called fallopian tube cancer. ? Cancer of the abdominal or pelvic lining (peritoneal cancer). ? Prostate. ? Pancreatic.  Cervical, Uterine, and Ovarian Cancer Your health care provider may recommend that you be screened regularly for cancer of the pelvic organs. These include your ovaries, uterus, and vagina. This screening involves a pelvic exam, which includes checking for microscopic changes to the surface of your cervix (Pap test).  For women ages 21-65, health care providers may recommend a pelvic exam and a Pap test every three years. For women ages 79-65, they may recommend the Pap test and pelvic exam, combined with testing for human papilloma virus (HPV), every five years. Some types of HPV increase your risk of cervical cancer. Testing for HPV may also be done on women of any age who have unclear Pap test results.  Other health care providers may not recommend any screening for nonpregnant women who are considered low risk for pelvic cancer and have no symptoms. Ask your health care provider if a screening pelvic exam is right for you.  If you have had past treatment for cervical cancer or a condition that could lead to cancer, you need Pap tests and screening for cancer for at least 20 years after your treatment. If Pap tests have been discontinued for you, your risk factors (such as having a new sexual partner) need to be  reassessed to determine if you should start having screenings again. Some women have medical problems that increase the chance of getting cervical cancer. In these cases, your health care provider may recommend that you have screening and Pap tests more often.  If you have a family history of uterine cancer or ovarian cancer, talk with your health care provider about genetic screening.  If you have vaginal bleeding after reaching menopause, tell your health care provider.  There are currently no reliable tests available to screen for ovarian cancer.  Lung Cancer Lung cancer screening is recommended for adults 69-62 years old who are at high risk for lung cancer because of a history of smoking. A yearly low-dose CT scan of the lungs is recommended if you:  Currently smoke.  Have a history of at least 30 pack-years of smoking and you currently smoke or have quit within the past 15 years. A pack-year is smoking an average of one pack of cigarettes per day for one year.  Yearly screening should:  Continue until it has been 15 years since you quit.  Stop if you develop a health problem that would prevent you from having lung cancer treatment.  Colorectal Cancer  This type of cancer can be detected and can often be prevented.  Routine colorectal cancer screening usually begins at  age 42 and continues through age 45.  If you have risk factors for colon cancer, your health care provider may recommend that you be screened at an earlier age.  If you have a family history of colorectal cancer, talk with your health care provider about genetic screening.  Your health care provider may also recommend using home test kits to check for hidden blood in your stool.  A small camera at the end of a tube can be used to examine your colon directly (sigmoidoscopy or colonoscopy). This is done to check for the earliest forms of colorectal cancer.  Direct examination of the colon should be repeated every  5-10 years until age 71. However, if early forms of precancerous polyps or small growths are found or if you have a family history or genetic risk for colorectal cancer, you may need to be screened more often.  Skin Cancer  Check your skin from head to toe regularly.  Monitor any moles. Be sure to tell your health care provider: ? About any new moles or changes in moles, especially if there is a change in a mole's shape or color. ? If you have a mole that is larger than the size of a pencil eraser.  If any of your family members has a history of skin cancer, especially at a young age, talk with your health care provider about genetic screening.  Always use sunscreen. Apply sunscreen liberally and repeatedly throughout the day.  Whenever you are outside, protect yourself by wearing long sleeves, pants, a wide-brimmed hat, and sunglasses.  What should I know about osteoporosis? Osteoporosis is a condition in which bone destruction happens more quickly than new bone creation. After menopause, you may be at an increased risk for osteoporosis. To help prevent osteoporosis or the bone fractures that can happen because of osteoporosis, the following is recommended:  If you are 46-71 years old, get at least 1,000 mg of calcium and at least 600 mg of vitamin D per day.  If you are older than age 55 but younger than age 65, get at least 1,200 mg of calcium and at least 600 mg of vitamin D per day.  If you are older than age 54, get at least 1,200 mg of calcium and at least 800 mg of vitamin D per day.  Smoking and excessive alcohol intake increase the risk of osteoporosis. Eat foods that are rich in calcium and vitamin D, and do weight-bearing exercises several times each week as directed by your health care provider. What should I know about how menopause affects my mental health? Depression may occur at any age, but it is more common as you become older. Common symptoms of depression  include:  Low or sad mood.  Changes in sleep patterns.  Changes in appetite or eating patterns.  Feeling an overall lack of motivation or enjoyment of activities that you previously enjoyed.  Frequent crying spells.  Talk with your health care provider if you think that you are experiencing depression. What should I know about immunizations? It is important that you get and maintain your immunizations. These include:  Tetanus, diphtheria, and pertussis (Tdap) booster vaccine.  Influenza every year before the flu season begins.  Pneumonia vaccine.  Shingles vaccine.  Your health care provider may also recommend other immunizations. This information is not intended to replace advice given to you by your health care provider. Make sure you discuss any questions you have with your health care provider. Document Released: 10/22/2005  Document Revised: 03/19/2016 Document Reviewed: 06/03/2015 Elsevier Interactive Patient Education  2018 Elsevier Inc.  

## 2018-07-17 ENCOUNTER — Telehealth: Payer: Self-pay | Admitting: Internal Medicine

## 2018-07-17 NOTE — Telephone Encounter (Signed)
She can stop medication if she would like and we can recheck cholesterol in 3 months to see how she is doing off med. Please order future lipid.

## 2018-07-17 NOTE — Telephone Encounter (Signed)
Patient stated that this is a new script for [facility] because medication was previously changed to Atorvastatin. Patient stated that [facility] told her that it would take 7-10 days before she would get the medication. Patient stated that she has not gotten her lab results and if she is going to stay on this medication she does need a temporary script sent to [facility] for a 30 day supply to last her until she can get script from [place]. Patient stated that she only has 2 pills left.

## 2018-07-17 NOTE — Telephone Encounter (Signed)
° °  1. Which medications need to be refilled? (please list name of each medication and dose if known) atorvastatin 10mg    2. Which pharmacy/location (including street and city if local pharmacy) is medication to be sent to?[facility]/[place]  3. Do they need a 30 day or 90 day supply? 53  Pt only has 2 pills left and [employer] won't sent out her prescription until 7-10 days

## 2018-07-21 ENCOUNTER — Other Ambulatory Visit: Payer: Self-pay

## 2018-07-21 MED ORDER — ATORVASTATIN CALCIUM 10 MG PO TABS: 10.0000 mg | ORAL_TABLET | Freq: Every day | ORAL | 2 refills | Status: DC

## 2018-08-01 NOTE — Telephone Encounter (Signed)
Pt called office stating that she and Rollene Fare agreed she would stop taking the Atorvastatin, but she received a 90 day supply in the mail from [place]. Pt wants to know if she should take them or hold off. Also pt says she never received the cologuard kit. Please advise pt what to do with medication.

## 2018-08-02 NOTE — Telephone Encounter (Signed)
I spoke to pt and let her know that I refilled the chol meds from an incoming request from [facility], I was not aware that she should not continue as it was not noted in lab result but a telephone encounter.... Pt has a lab only appt scheduled in 3 months  Cologuard form has been faxed w/ demographics and insurance card attached

## 2018-08-29 DIAGNOSIS — Z1211 Encounter for screening for malignant neoplasm of colon: Secondary | ICD-10-CM | POA: Diagnosis not present

## 2018-08-29 DIAGNOSIS — Z1212 Encounter for screening for malignant neoplasm of rectum: Secondary | ICD-10-CM | POA: Diagnosis not present

## 2018-09-02 LAB — COLOGUARD

## 2018-09-08 ENCOUNTER — Telehealth: Payer: Self-pay

## 2018-09-08 ENCOUNTER — Other Ambulatory Visit: Payer: Self-pay | Admitting: Internal Medicine

## 2018-09-08 ENCOUNTER — Telehealth: Payer: Self-pay | Admitting: *Deleted

## 2018-09-08 DIAGNOSIS — R195 Other fecal abnormalities: Secondary | ICD-10-CM

## 2018-09-08 NOTE — Telephone Encounter (Signed)
[employer] contacted office wanting to confirm pts positive cologuard results have been receive. If not, they can be reached at the provided tele #, and can resend results if Order# [id number], is provided.

## 2018-09-08 NOTE — Telephone Encounter (Signed)
I have not received results. Please have the refax. In the meantime, notify pt of results. Ask her if she is okay for referral to GI for colonoscopy.

## 2018-09-08 NOTE — Telephone Encounter (Signed)
Cologuard (DNA stool test) result:  Positive  Left message on voicemail, pt needs to be referred to GI to have Colonoscopy, wanted to know if pt is okay with referral for further evaluation

## 2018-09-08 NOTE — Telephone Encounter (Signed)
Pt returning your call. Please call pt. °

## 2018-09-08 NOTE — Telephone Encounter (Signed)
Pt states she is okay with referral to GI for colonoscopy

## 2018-09-08 NOTE — Telephone Encounter (Signed)
Referral to GI placed

## 2018-09-19 ENCOUNTER — Other Ambulatory Visit: Payer: Self-pay

## 2018-09-19 ENCOUNTER — Telehealth: Payer: Self-pay | Admitting: Gastroenterology

## 2018-09-19 ENCOUNTER — Telehealth: Payer: Self-pay

## 2018-09-19 DIAGNOSIS — R195 Other fecal abnormalities: Secondary | ICD-10-CM

## 2018-09-19 NOTE — Telephone Encounter (Signed)
Patient called & wanted to change location of colonoscopy scheduled for 09-29-2018 with DR Allen Norris. Currently scheduled at [facility] wants to change it to [facility]

## 2018-09-19 NOTE — Telephone Encounter (Signed)
Returned patients call.  Colonoscopy has been scheduled for 09/29/18 with Dr. Allen Norris at [facility].  Thanks [employer]

## 2018-09-19 NOTE — Telephone Encounter (Signed)
Pt left vm to make an apt for a colonoscopy

## 2018-09-19 NOTE — Telephone Encounter (Signed)
Call returned.  Patients colonoscopy has been rescheduled to [facility] for 09/27/18 with Dr. Vicente Males.  Thanks [employer]

## 2018-09-27 ENCOUNTER — Ambulatory Visit
Admission: RE | Admit: 2018-09-27 | Discharge: 2018-09-27 | Disposition: A | Discharge status: Home / Self Care | Payer: Medicare HMO | Attending: Gastroenterology | Admitting: Gastroenterology

## 2018-09-27 ENCOUNTER — Encounter: Payer: Self-pay | Admitting: *Deleted

## 2018-09-27 ENCOUNTER — Ambulatory Visit: Payer: Medicare HMO | Admitting: Certified Registered Nurse Anesthetist

## 2018-09-27 ENCOUNTER — Other Ambulatory Visit: Payer: Self-pay

## 2018-09-27 ENCOUNTER — Encounter
Admission: RE | Disposition: A | Discharge status: Home / Self Care | Payer: Self-pay | Source: Home / Self Care | Attending: Gastroenterology

## 2018-09-27 DIAGNOSIS — E785 Hyperlipidemia, unspecified: Secondary | ICD-10-CM | POA: Diagnosis not present

## 2018-09-27 DIAGNOSIS — R195 Other fecal abnormalities: Secondary | ICD-10-CM

## 2018-09-27 DIAGNOSIS — D122 Benign neoplasm of ascending colon: Secondary | ICD-10-CM | POA: Diagnosis not present

## 2018-09-27 DIAGNOSIS — Z79890 Hormone replacement therapy (postmenopausal): Secondary | ICD-10-CM | POA: Diagnosis not present

## 2018-09-27 DIAGNOSIS — K635 Polyp of colon: Secondary | ICD-10-CM | POA: Diagnosis not present

## 2018-09-27 DIAGNOSIS — D124 Benign neoplasm of descending colon: Secondary | ICD-10-CM | POA: Diagnosis not present

## 2018-09-27 DIAGNOSIS — K5790 Diverticulosis of intestine, part unspecified, without perforation or abscess without bleeding: Secondary | ICD-10-CM | POA: Diagnosis not present

## 2018-09-27 DIAGNOSIS — D126 Benign neoplasm of colon, unspecified: Secondary | ICD-10-CM | POA: Diagnosis not present

## 2018-09-27 HISTORY — PX: COLONOSCOPY WITH PROPOFOL: SHX5780

## 2018-09-27 SURGERY — COLONOSCOPY WITH PROPOFOL
Anesthesia: General

## 2018-09-27 MED ORDER — PROPOFOL 10 MG/ML IV BOLUS
INTRAVENOUS | Status: AC
  Filled 2018-09-27: qty 40

## 2018-09-27 MED ORDER — PHENYLEPHRINE HCL 10 MG/ML IJ SOLN
INTRAMUSCULAR | Status: DC | PRN
  Administered 2018-09-27 (×3): 100 ug via INTRAVENOUS

## 2018-09-27 MED ORDER — PROPOFOL 500 MG/50ML IV EMUL
INTRAVENOUS | Status: DC | PRN
  Administered 2018-09-27: 150 ug/kg/min via INTRAVENOUS

## 2018-09-27 MED ORDER — PROPOFOL 10 MG/ML IV BOLUS
INTRAVENOUS | Status: DC | PRN
  Administered 2018-09-27: 50 mg via INTRAVENOUS
  Administered 2018-09-27 (×2): 20 mg via INTRAVENOUS

## 2018-09-27 MED ORDER — SODIUM CHLORIDE 0.9 % IV SOLN
INTRAVENOUS | Status: DC
  Administered 2018-09-27: 14:00:00 via INTRAVENOUS

## 2018-09-27 MED ORDER — LIDOCAINE HCL (CARDIAC) PF 100 MG/5ML IV SOSY
PREFILLED_SYRINGE | INTRAVENOUS | Status: DC | PRN
  Administered 2018-09-27: 50 mg via INTRAVENOUS

## 2018-09-27 MED ORDER — SODIUM CHLORIDE 0.9 % IV SOLN: INTRAVENOUS | Status: DC

## 2018-09-27 NOTE — Anesthesia Preprocedure Evaluation (Signed)
Anesthesia Evaluation  Patient identified by MRN, date of birth, ID band Patient awake    Reviewed: Allergy & Precautions, H&P , NPO status , Patient's Chart, lab work & pertinent test results, reviewed documented beta blocker date and time   Airway Mallampati: II   Neck ROM: full    Dental  (+) Poor Dentition   Pulmonary neg pulmonary ROS,    Pulmonary exam normal        Cardiovascular negative cardio ROS Normal cardiovascular exam Rhythm:regular Rate:Normal     Neuro/Psych negative neurological ROS  negative psych ROS   GI/Hepatic negative GI ROS, Neg liver ROS,   Endo/Other  negative endocrine ROS  Renal/GU negative Renal ROS  negative genitourinary   Musculoskeletal   Abdominal   Peds  Hematology negative hematology ROS (+)   Anesthesia Other Findings Past Medical History: No date: Hyperlipidemia No date: Intracervical pessary No date: Unspecified disorder of bladder No date: Uterine prolapse Past Surgical History: 1990: APPENDECTOMY 1978: bartholins cyst No date: BIOPSY BREAST; Right No date: BREAST BIOPSY; Left     Comment:  benign BMI    Body Mass Index:  23.83 kg/m     Reproductive/Obstetrics negative OB ROS                             Anesthesia Physical Anesthesia Plan  ASA: II  Anesthesia Plan: General   Post-op Pain Management:    Induction:   PONV Risk Score and Plan:   Airway Management Planned:   Additional Equipment:   Intra-op Plan:   Post-operative Plan:   Informed Consent: I have reviewed the patients History and Physical, chart, labs and discussed the procedure including the risks, benefits and alternatives for the proposed anesthesia with the patient or authorized representative who has indicated his/her understanding and acceptance.     Dental Advisory Given  Plan Discussed with: CRNA  Anesthesia Plan Comments:          Anesthesia Quick Evaluation

## 2018-09-27 NOTE — Anesthesia Post-op Follow-up Note (Signed)
Anesthesia QCDR form completed.        

## 2018-09-27 NOTE — Op Note (Signed)
[facility] Gastroenterology Patient Name: Heidi Mitchell Procedure Date: 09/27/2018 1:56 PM MRN: [id number] Account #: [id number] Date of Birth: 02/28/1951 Admit Type: Outpatient Age: 68 Room: [facility] ENDO ROOM 4 Gender: Female Note Status: Finalized Procedure:            Colonoscopy Indications:          Positive Cologuard test Providers:            Jonathon Bellows MD, MD Referring MD:         Jearld Fenton (Referring MD) Medicines:            Monitored Anesthesia Care Complications:        No immediate complications. Procedure:            Pre-Anesthesia Assessment:                       - Prior to the procedure, a History and Physical was                        performed, and patient medications, allergies and                        sensitivities were reviewed. The patient's tolerance of                        previous anesthesia was reviewed.                       - The risks and benefits of the procedure and the                        sedation options and risks were discussed with the                        patient. All questions were answered and informed                        consent was obtained.                       - ASA Grade Assessment: II - A patient with mild                        systemic disease.                       After obtaining informed consent, the colonoscope was                        passed under direct vision. Throughout the procedure,                        the patient's blood pressure, pulse, and oxygen                        saturations were monitored continuously. The                        Colonoscope was introduced through the anus and                        advanced  to the the cecum, identified by the                        appendiceal orifice, IC valve and transillumination.                        The colonoscopy was performed with ease. The patient                        tolerated the procedure well. The quality of the bowel                    preparation was good. Findings:      The perianal and digital rectal examinations were normal.      Two sessile polyps were found in the descending colon and ascending       colon. The polyps were 4 to 6 mm in size. These polyps were removed with       a cold snare. Resection and retrieval were complete.      Two sessile polyps were found in the ascending colon. The polyps were 3       to 4 mm in size. These polyps were removed with a cold biopsy forceps.       Resection and retrieval were complete.      A 3 mm polyp was found in the descending colon. The polyp was sessile.       The polyp was removed with a cold biopsy forceps. Resection and       retrieval were complete.      The exam was otherwise without abnormality. Impression:           - Two 4 to 6 mm polyps in the descending colon and in                        the ascending colon, removed with a cold snare.                        Resected and retrieved.                       - Two 3 to 4 mm polyps in the ascending colon, removed                        with a cold biopsy forceps. Resected and retrieved.                       - One 3 mm polyp in the descending colon, removed with                        a cold biopsy forceps. Resected and retrieved.                       - The examination was otherwise normal. Recommendation:       - Discharge patient to home (with escort).                       - Resume previous diet.                       - Continue present medications.                       -  Await pathology results.                       - Repeat colonoscopy in 3 - 5 years for surveillance. Procedure Code(s):    --- Professional ---                       [phone], Colonoscopy, flexible; with removal of tumor(s),                        polyp(s), or other lesion(s) by snare technique                       [id number], 39, Colonoscopy, flexible; with biopsy, single                        or multiple Diagnosis Code(s):     --- Professional ---                       D12.4, Benign neoplasm of descending colon                       D12.2, Benign neoplasm of ascending colon                       R19.5, Other fecal abnormalities CPT copyright 2018 American Medical Association. All rights reserved. The codes documented in this report are preliminary and upon coder review may  be revised to meet current compliance requirements. Jonathon Bellows, MD Jonathon Bellows MD, MD 09/27/2018 2:23:58 PM This report has been signed electronically. Number of Addenda: 0 Note Initiated On: 09/27/2018 1:56 PM Scope Withdrawal Time: 0 hours 16 minutes 9 seconds  Total Procedure Duration: 0 hours 19 minutes 42 seconds       [facility]

## 2018-09-27 NOTE — H&P (Addendum)
Jonathon Bellows, MD [address], [place], [place], [place], [address] [address], [place], [place], [place], [address] Phone: [phone]  Fax: [phone]  Primary Care Physician:  Jearld Fenton, NP   Pre-Procedure History & Physical: HPI:  Heidi Mitchell is a 68 y.o. female is here for an colonoscopy.   Past Medical History:  Diagnosis Date  . Hyperlipidemia   . Intracervical pessary   . Unspecified disorder of bladder   . Uterine prolapse     Past Surgical History:  Procedure Laterality Date  . APPENDECTOMY  1990  . bartholins cyst  1978  . BIOPSY BREAST Right   . BREAST BIOPSY Left    benign    Prior to Admission medications   Medication Sig Start Date End Date Taking? Authorizing Provider  atorvastatin (LIPITOR) 10 MG tablet Take 1 tablet (10 mg total) by mouth daily. 07/21/18   Jearld Fenton, NP  estradiol (ESTRACE VAGINAL) 0.1 MG/GM vaginal cream Place 1 Applicatorful vaginally 2 (two) times a week. 05/27/16   Rubie Maid, MD    Allergies as of 09/20/2018 - Review Complete 07/13/2018  Allergen Reaction Noted  . Sertraline hcl  07/27/2017    Family History  Problem Relation Age of Onset  . Pneumonia Mother   . Alcohol abuse Mother   . Uterine cancer Sister     Social History   Socioeconomic History  . Marital status: Married    Spouse name: Not on file  . Number of children: 1  . Years of education: Not on file  . Highest education level: Not on file  Occupational History  . Occupation: Works in a [employer]  . Financial resource strain: Not on file  . Food insecurity:    Worry: Not on file    Inability: Not on file  . Transportation needs:    Medical: Not on file    Non-medical: Not on file  Tobacco Use  . Smoking status: Never Smoker  . Smokeless tobacco: Never Used  Substance and Sexual Activity  . Alcohol use: No  . Drug use: No  . Sexual activity: Never    Birth control/protection: None  Lifestyle    . Physical activity:    Days per week: Not on file    Minutes per session: Not on file  . Stress: Not on file  Relationships  . Social connections:    Talks on phone: Not on file    Gets together: Not on file    Attends religious service: Not on file    Active member of club or organization: Not on file    Attends meetings of clubs or organizations: Not on file    Relationship status: Not on file  . Intimate partner violence:    Fear of current or ex partner: Not on file    Emotionally abused: Not on file    Physically abused: Not on file    Forced sexual activity: Not on file  Other Topics Concern  . Not on file  Social History Narrative   Does not have living will   Husband and son aware of wishes.   Pt would desires CPR but would not want prolonged life support if futile.    Review of Systems: See HPI, otherwise negative ROS  Physical Exam: BP 133/77   Pulse 85   Temp 98.2 F (36.8 C) (Tympanic)   Resp 18   Ht 5' (  1.524 m)   Wt 55.3 kg   LMP 11/10/2012 (LMP Unknown)   SpO2 96%   BMI 23.83 kg/m  General:   Alert,  pleasant and cooperative in NAD Head:  Normocephalic and atraumatic. Neck:  Supple; no masses or thyromegaly. Lungs:  Clear throughout to auscultation, normal respiratory effort.    Heart:  +S1, +S2, Regular rate and rhythm, No edema. Abdomen:  Soft, nontender and nondistended. Normal bowel sounds, without guarding, and without rebound.   Neurologic:  Alert and  oriented x4;  grossly normal neurologically.  Impression/Plan: Heidi Mitchell is here for an colonoscopy to be performed for positive cologuard test .Risks, benefits, limitations, and alternatives regarding  colonoscopy have been reviewed with the patient.  Questions have been answered.  All parties agreeable.   Jonathon Bellows, MD  09/27/2018, 1:53 PM

## 2018-09-27 NOTE — Transfer of Care (Signed)
Immediate Anesthesia Transfer of Care Note  Patient: Heidi Mitchell  Procedure(s) Performed: COLONOSCOPY WITH PROPOFOL (N/A )  Patient Location: PACU  Anesthesia Type:General  Level of Consciousness: drowsy  Airway & Oxygen Therapy: Patient Spontanous Breathing and Patient connected to nasal cannula oxygen  Post-op Assessment: Report given to RN and Post -op Vital signs reviewed and stable  Post vital signs: Reviewed and stable  Last Vitals:  Vitals Value Taken Time  BP 116/46 09/27/2018  2:26 PM  Temp    Pulse 58 09/27/2018  2:26 PM  Resp 17 09/27/2018  2:26 PM  SpO2 100 % 09/27/2018  2:26 PM    Last Pain:  Vitals:   09/27/18 1318  TempSrc: Tympanic      Patients Stated Pain Goal: 0 (04/02/81 8833)  Complications: No apparent anesthesia complications

## 2018-09-29 ENCOUNTER — Ambulatory Visit: Admit: 2018-09-29 | Payer: Medicare HMO | Admitting: Gastroenterology

## 2018-09-29 LAB — SURGICAL PATHOLOGY

## 2018-09-29 SURGERY — COLONOSCOPY WITH PROPOFOL
Anesthesia: Choice

## 2018-10-03 ENCOUNTER — Encounter: Payer: Self-pay | Admitting: Gastroenterology

## 2018-10-05 NOTE — Anesthesia Postprocedure Evaluation (Signed)
Anesthesia Post Note  Patient: Heidi Mitchell  Procedure(s) Performed: COLONOSCOPY WITH PROPOFOL (N/A )  Patient location during evaluation: PACU Anesthesia Type: General Level of consciousness: awake and alert Pain management: pain level controlled Vital Signs Assessment: post-procedure vital signs reviewed and stable Respiratory status: spontaneous breathing, nonlabored ventilation, respiratory function stable and patient connected to nasal cannula oxygen Cardiovascular status: blood pressure returned to baseline and stable Postop Assessment: no apparent nausea or vomiting Anesthetic complications: no     Last Vitals:  Vitals:   09/27/18 1440 09/27/18 1449  BP:  (!) 113/42  Pulse: 63 61  Resp: 16 (!) 23  Temp:    SpO2: 99% 100%    Last Pain:  Vitals:   09/28/18 0754  TempSrc:   PainSc: 0-No pain                 Molli Barrows

## 2018-11-02 ENCOUNTER — Other Ambulatory Visit (INDEPENDENT_AMBULATORY_CARE_PROVIDER_SITE_OTHER): Payer: Medicare HMO

## 2018-11-02 DIAGNOSIS — E7800 Pure hypercholesterolemia, unspecified: Secondary | ICD-10-CM

## 2018-11-02 LAB — LIPID PANEL
Cholesterol: 256 mg/dL — ABNORMAL HIGH (ref 0–200)
HDL: 53.7 mg/dL (ref >39.00)
LDL Cholesterol: 185 mg/dL — ABNORMAL HIGH (ref 0–99)
NonHDL: 202.73
Total CHOL/HDL Ratio: 5
Triglycerides: 88 mg/dL (ref 0.0–149.0)
VLDL: 17.6 mg/dL (ref 0.0–40.0)

## 2018-11-07 MED ORDER — ATORVASTATIN CALCIUM 10 MG PO TABS: 10.0000 mg | ORAL_TABLET | Freq: Every day | ORAL | 0 refills | Status: DC

## 2018-11-07 NOTE — Addendum Note (Signed)
Addended by: Lurlean Nanny on: 11/07/2018 04:58 PM   Modules accepted: Orders

## 2018-12-25 ENCOUNTER — Telehealth: Payer: Self-pay | Admitting: Internal Medicine

## 2018-12-25 NOTE — Telephone Encounter (Signed)
Best number [phone] Pt called wanting to switch pcp from [facility] to Dr Einar Pheasant.  Pt stated she wanted MD.   Is ok to switch pcp

## 2018-12-25 NOTE — Telephone Encounter (Signed)
Fine with me

## 2018-12-27 NOTE — Telephone Encounter (Signed)
Appointment 6/23

## 2019-01-08 ENCOUNTER — Other Ambulatory Visit: Payer: Self-pay | Admitting: Internal Medicine

## 2019-01-09 ENCOUNTER — Other Ambulatory Visit: Payer: Self-pay

## 2019-01-09 ENCOUNTER — Ambulatory Visit (INDEPENDENT_AMBULATORY_CARE_PROVIDER_SITE_OTHER): Payer: Medicare HMO | Admitting: Internal Medicine

## 2019-01-09 ENCOUNTER — Encounter: Payer: Self-pay | Admitting: Internal Medicine

## 2019-01-09 VITALS — BP 116/70 | HR 60 | Temp 97.8°F | Wt 129.0 lb

## 2019-01-09 DIAGNOSIS — R110 Nausea: Secondary | ICD-10-CM

## 2019-01-09 DIAGNOSIS — R42 Dizziness and giddiness: Secondary | ICD-10-CM

## 2019-01-09 DIAGNOSIS — G44209 Tension-type headache, unspecified, not intractable: Secondary | ICD-10-CM

## 2019-01-09 MED ORDER — MECLIZINE HCL 25 MG PO TABS: 25.0000 mg | ORAL_TABLET | Freq: Three times a day (TID) | ORAL | 0 refills | Status: DC | PRN

## 2019-01-09 NOTE — Patient Instructions (Signed)

## 2019-01-09 NOTE — Progress Notes (Signed)
Subjective:    Patient ID: Heidi Mitchell, female    DOB: Nov 17, 1950, 68 y.o.   MRN: [id number]  HPI  Pt presents to the clinic today with c/o intermittent headaches. This started 3 weeks ago. The headaches are located in her forehead. She describes the pain as pressure. She reports associated dizziness and nausea. She describes the dizziness as a sensation that the room is spinning. The dizziness is worse with position changes. She denies visual changes, sweating, chest pain, shortness of breath of vomiting. She denies any head or neck trauma. She has tried Tylenol with minimal relief. She reports this is actually a chronic issue that intermittent flares up from time to time. Her main concern is that it typically does not last this long.  Review of Systems      Past Medical History:  Diagnosis Date  . Hyperlipidemia   . Intracervical pessary   . Unspecified disorder of bladder   . Uterine prolapse     Current Outpatient Medications  Medication Sig Dispense Refill  . atorvastatin (LIPITOR) 10 MG tablet TAKE 1 TABLET EVERY DAY 90 tablet 0  . estradiol (ESTRACE VAGINAL) 0.1 MG/GM vaginal cream Place 1 Applicatorful vaginally 2 (two) times a week. 42.5 g 12   No current facility-administered medications for this visit.     Allergies  Allergen Reactions  . Sertraline Hcl     Muscle spasms    Family History  Problem Relation Age of Onset  . Pneumonia Mother   . Alcohol abuse Mother   . Uterine cancer Sister     Social History   Socioeconomic History  . Marital status: Married    Spouse name: Not on file  . Number of children: 1  . Years of education: Not on file  . Highest education level: Not on file  Occupational History  . Occupation: Works in a [employer]  . Financial resource strain: Not on file  . Food insecurity:    Worry: Not on file    Inability: Not on file  . Transportation needs:    Medical: Not on file    Non-medical: Not on file  Tobacco  Use  . Smoking status: Never Smoker  . Smokeless tobacco: Never Used  Substance and Sexual Activity  . Alcohol use: No  . Drug use: No  . Sexual activity: Never    Birth control/protection: None  Lifestyle  . Physical activity:    Days per week: Not on file    Minutes per session: Not on file  . Stress: Not on file  Relationships  . Social connections:    Talks on phone: Not on file    Gets together: Not on file    Attends religious service: Not on file    Active member of club or organization: Not on file    Attends meetings of clubs or organizations: Not on file    Relationship status: Not on file  . Intimate partner violence:    Fear of current or ex partner: Not on file    Emotionally abused: Not on file    Physically abused: Not on file    Forced sexual activity: Not on file  Other Topics Concern  . Not on file  Social History Narrative   Does not have living will   Husband and son aware of wishes.   Pt would desires CPR but would not want prolonged life support if futile.     Constitutional: Pt reports headaches.  Denies fever, malaise, fatigue, or abrupt weight changes.  HEENT: Denies eye pain, eye redness, ear pain, ringing in the ears, wax buildup, runny nose, nasal congestion, bloody nose, or sore throat. Respiratory: Denies difficulty breathing, shortness of breath, cough or sputum production.   Cardiovascular: Denies chest pain, chest tightness, palpitations or swelling in the hands or feet.  Gastrointestinal: Pt reports nausea. Denies abdominal pain, bloating, constipation, diarrhea or blood in the stool.  GU: Denies urgency, frequency, pain with urination, burning sensation, blood in urine, odor or discharge. Musculoskeletal: Denies decrease in range of motion, difficulty with gait, muscle pain or joint pain and swelling.  Skin: Denies redness, rashes, lesions or ulcercations.  Neurological: Pt reports dizziness. Denies difficulty with memory, difficulty with  speech or problems with balance and coordination.  Psych: Denies anxiety, depression, SI/HI.  No other specific complaints in a complete review of systems (except as listed in HPI above).  Objective:   Physical Exam BP 116/70   Pulse 60   Temp 97.8 F (36.6 C) (Oral)   Wt 129 lb (58.5 kg)   LMP 11/10/2012 (LMP Unknown)   SpO2 98%   BMI 25.19 kg/m  Wt Readings from Last 3 Encounters:  01/09/19 129 lb (58.5 kg)  09/27/18 122 lb (55.3 kg)  07/13/18 127 lb (57.6 kg)    General: Appears her stated age, well developed, well nourished in NAD. Skin: Warm, dry and intact. No rashes noted. HEENT: Head: normal shape and size; Eyes: sclera white, no icterus, conjunctiva pink, PERRLA and EOMs intact, no nystagmus; Right Ear: Cerumen impaction; Left Ear: Tm's gray and intact, normal light reflex;  Neck:  No adenopathy noted. Cardiovascular: Normal rate and rhythm. S1,S2 noted. ? Slight murmur. Pulmonary/Chest: Normal effort and positive vesicular breath sounds. No respiratory distress. No wheezes, rales or ronchi noted.  Abdomen: Soft and nontender. Normal bowel sounds.  Neurological: Alert and oriented. Coordination normal.   BMET    Component Value Date/Time   NA 140 07/13/2018 0852   K 4.4 07/13/2018 0852   CL 106 07/13/2018 0852   CO2 29 07/13/2018 0852   GLUCOSE 102 (H) 07/13/2018 0852   BUN 17 07/13/2018 0852   CREATININE 0.66 07/13/2018 0852   CALCIUM 9.2 07/13/2018 0852   GFRNONAA 119.65 01/30/2010 1419    Lipid Panel     Component Value Date/Time   CHOL 256 (H) 11/02/2018 0904   TRIG 88.0 11/02/2018 0904   HDL 53.70 11/02/2018 0904   CHOLHDL 5 11/02/2018 0904   VLDL 17.6 11/02/2018 0904   LDLCALC 185 (H) 11/02/2018 0904    CBC    Component Value Date/Time   WBC 7.2 07/13/2018 0852   RBC 4.48 07/13/2018 0852   HGB 12.9 07/13/2018 0852   HCT 38.9 07/13/2018 0852   PLT 193.0 07/13/2018 0852   MCV 86.8 07/13/2018 0852   MCHC 33.1 07/13/2018 0852   RDW 15.3  07/13/2018 0852   LYMPHSABS 2.2 06/01/2017 1407   MONOABS 0.7 06/01/2017 1407   EOSABS 0.1 06/01/2017 1407   BASOSABS 0.1 06/01/2017 1407    Hgb A1C No results found for: HGBA1C           Assessment & Plan:  Acute Headache, Dizziness, Nausea:  Encouraged rest and adequate fluid intake Start Allegra and Flonase OTC RX for Meclizine 25 mg TID prn- sedation caution given Can take Tylenol 1000 mg TID or Ibuprofen 800 mg TID prn Return/ER precautions discussed  Return precautions discussed Webb Silversmith, NP

## 2019-02-01 ENCOUNTER — Other Ambulatory Visit: Payer: Medicare HMO

## 2019-03-06 ENCOUNTER — Other Ambulatory Visit: Payer: Medicare HMO

## 2019-03-06 ENCOUNTER — Ambulatory Visit: Payer: Medicare HMO | Admitting: Family Medicine

## 2019-03-06 ENCOUNTER — Encounter: Payer: Self-pay | Admitting: Family Medicine

## 2019-03-06 ENCOUNTER — Other Ambulatory Visit: Payer: Self-pay

## 2019-03-06 VITALS — BP 142/62 | HR 50 | Temp 97.7°F | Resp 16 | Ht 60.0 in | Wt 129.5 lb

## 2019-03-06 DIAGNOSIS — R001 Bradycardia, unspecified: Secondary | ICD-10-CM | POA: Diagnosis not present

## 2019-03-06 DIAGNOSIS — R42 Dizziness and giddiness: Secondary | ICD-10-CM | POA: Diagnosis not present

## 2019-03-06 NOTE — Assessment & Plan Note (Signed)
Previous normal cardiac work-up but wonder if bradycardia could be contributing to dizzy symptoms. Advised follow-up with cardiology to see if additional work-up would be indicated.

## 2019-03-06 NOTE — Progress Notes (Signed)
Subjective:     Heidi Mitchell is a 67 y.o. female presenting for Transfer of Care (from Webb Silversmith, NP) and Dizziness (with positional changes. Meclizine helps a little. Some soreness on right side of the head.)     HPI   #Dizziness - with position changes - has happened intermittently - started several years ago - has to get out of bed slowly - also worse with bending often - will also get a sore on the side of her head near the right ear - has gone to an ENT w/o anything found with ear - dizziness is like the room is spinning - worst it ever was - lasted 1 day and was associated with n/v and inability to stand up - will also happen when she lay down or if she turns over in bed - worse with laying on the right side - meclizine is helping   Review of Systems  Constitutional: Negative for chills and fever.  HENT: Positive for ear pain. Negative for congestion, sinus pressure and sinus pain.   Respiratory: Negative for cough and shortness of breath.   Cardiovascular: Negative for chest pain and palpitations.  Gastrointestinal: Negative for nausea and vomiting.  Endocrine: Negative for cold intolerance and heat intolerance.  Musculoskeletal: Negative for arthralgias, myalgias and neck pain.     Social History   Tobacco Use  Smoking Status Never Smoker  Smokeless Tobacco Never Used        Objective:    BP Readings from Last 3 Encounters:  03/06/19 (!) 142/62  01/09/19 116/70  09/27/18 (!) 113/42   Wt Readings from Last 3 Encounters:  03/06/19 129 lb 8 oz (58.7 kg)  01/09/19 129 lb (58.5 kg)  09/27/18 122 lb (55.3 kg)    BP (!) 142/62   Pulse (!) 50   Temp 97.7 F (36.5 C)   Resp 16   Ht 5' (1.524 m)   Wt 129 lb 8 oz (58.7 kg)   LMP 11/10/2012 (LMP Unknown)   SpO2 98%   BMI 25.29 kg/m    Physical Exam Constitutional:      General: She is not in acute distress.    Appearance: She is well-developed. She is not diaphoretic.  HENT:     Right  Ear: External ear normal.     Left Ear: External ear normal.     Nose: Nose normal.  Eyes:     Extraocular Movements: Extraocular movements intact.     Conjunctiva/sclera: Conjunctivae normal.  Neck:     Musculoskeletal: Neck supple.  Cardiovascular:     Rate and Rhythm: Normal rate and regular rhythm.     Heart sounds: No murmur.  Pulmonary:     Effort: Pulmonary effort is normal. No respiratory distress.     Breath sounds: Normal breath sounds. No wheezing.  Skin:    General: Skin is warm and dry.     Capillary Refill: Capillary refill takes less than 2 seconds.  Neurological:     Mental Status: She is alert. Mental status is at baseline.  Psychiatric:        Mood and Affect: Mood normal.        Behavior: Behavior normal.           Assessment & Plan:   Problem List Items Addressed This Visit      Other   Vertigo - Primary    Position change driven, however, orthostatics are normal. Discussed referral to PT to get some home exercises.  Continue prn meclizine      Relevant Orders   Ambulatory referral to Physical Therapy   Bradycardia    Previous normal cardiac work-up but wonder if bradycardia could be contributing to dizzy symptoms. Advised follow-up with cardiology to see if additional work-up would be indicated.       Relevant Orders   Ambulatory referral to Cardiology    Other Visit Diagnoses    Dizziness       Relevant Orders   Ambulatory referral to Cardiology       Return if symptoms worsen or fail to improve.  Lesleigh Noe, MD

## 2019-03-06 NOTE — Assessment & Plan Note (Signed)
Position change driven, however, orthostatics are normal. Discussed referral to PT to get some home exercises. Continue prn meclizine

## 2019-03-06 NOTE — Patient Instructions (Signed)
Dizziness - does seem most likely to be vertigo - referral to Physical therapy to get some tools to help with symptoms - continue taking meclizine as needed  Slow heart rate - referral to Cardiology to check back in

## 2019-03-08 ENCOUNTER — Other Ambulatory Visit: Payer: Self-pay | Admitting: Internal Medicine

## 2019-05-07 NOTE — Progress Notes (Signed)
Cardiology Office Note  Date:  05/08/2019   ID:  Heidi Mitchell, DOB Oct 16, 1950, MRN [id number]  PCP:  Lesleigh Noe, MD   Chief Complaint  Patient presents with  . New Patient (Initial Visit)    EKG/ Ortho  Per Dr. Einar Pheasant for bradycardia and dizziness. Patient denies dizziness at this time. Meds reviewed verbally with patient.     HPI:  [name] is a 68 year old woman with past medical history of Vertigo, prior episodes, severe episode about 3 years ago Hyperlipidemia Referred to cardiology by Waunita Schooner for bradycardia and dizziness, chest pain  Prior referral from primary care September 2018 for chest pain  Recent referral June 2020 for bradycardia dizziness  Recently seen by primary care March 06, 2019, felt to have vertigo Bradycardia noted on exam Blood pressure on that visit 142/62 pulse 50/47 Not on beta-blockers or calcium channel blockers  In follow-up today she reports that she feels well with no complaints No dizziness, no orthostasis Vertigo seems to happen with change in seasons Feels a pressure in her ears Dizziness symptoms laying in the bed when she rolls over or turns her head  EKG personally reviewed by myself on todays visit Shows NSR rate 66 bpm  Lab work reviewed Total chol 256 off statin Up from 155, when she was on statin Nonsmoker nodiabetes  Father 49 ETOH, "natural causes" mother 63s died ETOH  PMH:   has a past medical history of Hyperlipidemia, Intracervical pessary, Unspecified disorder of bladder, and Uterine prolapse.  PSH:    Past Surgical History:  Procedure Laterality Date  . APPENDECTOMY  1990  . bartholins cyst  1978  . BIOPSY BREAST Right   . BREAST BIOPSY Left    benign  . COLONOSCOPY WITH PROPOFOL N/A 09/27/2018   Procedure: COLONOSCOPY WITH PROPOFOL;  Surgeon: Jonathon Bellows, MD;  Location: [facility] ENDOSCOPY;  Service: Gastroenterology;  Laterality: N/A;    Current Outpatient Medications  Medication Sig Dispense  Refill  . atorvastatin (LIPITOR) 10 MG tablet TAKE 1 TABLET EVERY DAY 90 tablet 0   No current facility-administered medications for this visit.     Allergies:   Sertraline hcl   Social History:  The patient  reports that she has never smoked. She has never used smokeless tobacco. She reports that she does not drink alcohol or use drugs.   Family History:   family history includes Alcohol abuse in her mother; Other in her son; Uterine cancer in her sister.    Review of Systems: Review of Systems  Constitutional: Negative.   HENT: Negative.   Respiratory: Negative.   Cardiovascular: Negative.   Gastrointestinal: Negative.   Musculoskeletal: Negative.   Neurological: Negative.   Psychiatric/Behavioral: Negative.   All other systems reviewed and are negative.    PHYSICAL EXAM: VS:  BP 128/70 (BP Location: Right Arm, Patient Position: Sitting, Cuff Size: Normal)   Pulse 66   Ht 5\' 1"  (1.549 m)   Wt 127 lb 4 oz (57.7 kg)   LMP 11/10/2012 (LMP Unknown)   BMI 24.04 kg/m  , BMI Body mass index is 24.04 kg/m. GEN: Well nourished, well developed, in no acute distress HEENT: normal Neck: no JVD, carotid bruits, or masses Cardiac: RRR; no murmurs, rubs, or gallops,no edema  Respiratory:  clear to auscultation bilaterally, normal work of breathing GI: soft, nontender, nondistended, + BS MS: no deformity or atrophy Skin: warm and dry, no rash Neuro:  Strength and sensation are intact Psych: euthymic mood, full  affect   Recent Labs: 07/13/2018: ALT 13; BUN 17; Creatinine, Ser 0.66; Hemoglobin 12.9; Platelets 193.0; Potassium 4.4; Sodium 140    Lipid Panel Lab Results  Component Value Date   CHOL 256 (H) 11/02/2018   HDL 53.70 11/02/2018   LDLCALC 185 (H) 11/02/2018   TRIG 88.0 11/02/2018      Wt Readings from Last 3 Encounters:  05/08/19 127 lb 4 oz (57.7 kg)  03/06/19 129 lb 8 oz (58.7 kg)  01/09/19 129 lb (58.5 kg)     ASSESSMENT AND PLAN:  Problem List Items  Addressed This Visit      Cardiology Problems   Pure hypercholesterolemia     Other   Bradycardia - Primary   Relevant Orders   EKG 12-Lead     Bradycardia Unclear etiology, was asymptomatic with good blood pressure at the time of heart rate 47 up to 50 Asymptomatic currently, home monitoring with heart rates in the 60s Recommended that she continue to monitor heart rate at home Discussed various strategies such as using her blood pressure cuff, using a pulse oximeter, purchasing a Fitbit to monitor heart rate If there is documentation of bradycardia that is sustained could consider a long-term monitor/Zio for clarification  Hyperlipidemia Recommended she stay on her statin Appeared to have 100 point drop on the statin with total cholesterol in 150 range Without the statin up to 250s Likely familial Discussed anginal symptoms to watch for  Disposition:   F/U  As needed   Total encounter time more than 45 minutes  Greater than 50% was spent in counseling and coordination of care with the patient    Signed, Esmond Plants, M.D., Ph.D. [place], [place]

## 2019-05-08 ENCOUNTER — Other Ambulatory Visit: Payer: Self-pay

## 2019-05-08 ENCOUNTER — Encounter: Payer: Self-pay | Admitting: Cardiovascular Disease

## 2019-05-08 ENCOUNTER — Ambulatory Visit (INDEPENDENT_AMBULATORY_CARE_PROVIDER_SITE_OTHER): Payer: Medicare HMO | Admitting: Cardiovascular Disease

## 2019-05-08 VITALS — BP 128/70 | HR 66 | Ht 61.0 in | Wt 127.2 lb

## 2019-05-08 DIAGNOSIS — R001 Bradycardia, unspecified: Secondary | ICD-10-CM | POA: Diagnosis not present

## 2019-05-08 DIAGNOSIS — E7800 Pure hypercholesterolemia, unspecified: Secondary | ICD-10-CM | POA: Diagnosis not present

## 2019-05-08 NOTE — Patient Instructions (Signed)
Please monitor heart rate at home  Medication Instructions:  No changes  If you need a refill on your cardiac medications before your next appointment, please call your pharmacy.    Lab work: No new labs needed   If you have labs (blood work) drawn today and your tests are completely normal, you will receive your results only by: Marland Kitchen MyChart Message (if you have MyChart) OR . A paper copy in the mail If you have any lab test that is abnormal or we need to change your treatment, we will call you to review the results.   Testing/Procedures: No new testing needed   Follow-Up: At [facility], you and your health needs are our priority.  As part of our continuing mission to provide you with exceptional heart care, we have created designated Provider Care Teams.  These Care Teams include your primary Cardiologist (physician) and Advanced Practice Providers (APPs -  Physician Assistants and Nurse Practitioners) who all work together to provide you with the care you need, when you need it.  . You will need a follow up appointment as needed  . Providers on your designated Care Team:   . Murray Hodgkins, NP . Christell Faith, PA-C . Marrianne Mood, PA-C  Any Other Special Instructions Will Be Listed Below (If Applicable).  For educational health videos Log in to : [email or web address] Or : [email or web address], password : [email or web address]

## 2019-06-20 ENCOUNTER — Other Ambulatory Visit: Payer: Self-pay | Admitting: Internal Medicine

## 2019-07-17 ENCOUNTER — Ambulatory Visit (INDEPENDENT_AMBULATORY_CARE_PROVIDER_SITE_OTHER): Payer: Medicare HMO

## 2019-07-17 DIAGNOSIS — Z0000 Encounter for general adult medical examination without abnormal findings: Secondary | ICD-10-CM | POA: Diagnosis not present

## 2019-07-17 NOTE — Patient Instructions (Signed)
Heidi Mitchell , Thank you for taking time to come for your Medicare Wellness Visit. I appreciate your ongoing commitment to your health goals. Please review the following plan we discussed and let me know if I can assist you in the future.   Screening recommendations/referrals: Colonoscopy: up to date, completed 09/27/2018 Mammogram: declined at this time Bone Density: up to date, completed 08/09/2017 Recommended yearly ophthalmology/optometry visit for glaucoma screening and checkup Recommended yearly dental visit for hygiene and checkup  Vaccinations: Influenza vaccine: may get at next office visit Pneumococcal vaccine: declined Tdap vaccine: up to date, completed 02/26/2015 Shingles vaccine: declined    Advanced directives: Advance directive discussed with you today. Even though you declined this today please call our office should you change your mind and we can give you the proper paperwork for you to fill out.  Conditions/risks identified: hypercholesterolemia  Next appointment: 07/24/2019 @ 10:20 am    Preventive Care 65 Years and Older, Female Preventive care refers to lifestyle choices and visits with your health care provider that can promote health and wellness. What does preventive care include?  A yearly physical exam. This is also called an annual well check.  Dental exams once or twice a year.  Routine eye exams. Ask your health care provider how often you should have your eyes checked.  Personal lifestyle choices, including:  Daily care of your teeth and gums.  Regular physical activity.  Eating a healthy diet.  Avoiding tobacco and drug use.  Limiting alcohol use.  Practicing safe sex.  Taking low-dose aspirin every day.  Taking vitamin and mineral supplements as recommended by your health care provider. What happens during an annual well check? The services and screenings done by your health care provider during your annual well check will depend on  your age, overall health, lifestyle risk factors, and family history of disease. Counseling  Your health care provider may ask you questions about your:  Alcohol use.  Tobacco use.  Drug use.  Emotional well-being.  Home and relationship well-being.  Sexual activity.  Eating habits.  History of falls.  Memory and ability to understand (cognition).  Work and work Statistician.  Reproductive health. Screening  You may have the following tests or measurements:  Height, weight, and BMI.  Blood pressure.  Lipid and cholesterol levels. These may be checked every 5 years, or more frequently if you are over 44 years old.  Skin check.  Lung cancer screening. You may have this screening every year starting at age 24 if you have a 30-pack-year history of smoking and currently smoke or have quit within the past 15 years.  Fecal occult blood test (FOBT) of the stool. You may have this test every year starting at age 90.  Flexible sigmoidoscopy or colonoscopy. You may have a sigmoidoscopy every 5 years or a colonoscopy every 10 years starting at age 53.  Hepatitis C blood test.  Hepatitis B blood test.  Sexually transmitted disease (STD) testing.  Diabetes screening. This is done by checking your blood sugar (glucose) after you have not eaten for a while (fasting). You may have this done every 1-3 years.  Bone density scan. This is done to screen for osteoporosis. You may have this done starting at age 62.  Mammogram. This may be done every 1-2 years. Talk to your health care provider about how often you should have regular mammograms. Talk with your health care provider about your test results, treatment options, and if necessary, the need for  more tests. Vaccines  Your health care provider may recommend certain vaccines, such as:  Influenza vaccine. This is recommended every year.  Tetanus, diphtheria, and acellular pertussis (Tdap, Td) vaccine. You may need a Td booster  every 10 years.  Zoster vaccine. You may need this after age 46.  Pneumococcal 13-valent conjugate (PCV13) vaccine. One dose is recommended after age 2.  Pneumococcal polysaccharide (PPSV23) vaccine. One dose is recommended after age 62. Talk to your health care provider about which screenings and vaccines you need and how often you need them. This information is not intended to replace advice given to you by your health care provider. Make sure you discuss any questions you have with your health care provider. Document Released: 09/26/2015 Document Revised: 05/19/2016 Document Reviewed: 07/01/2015 Elsevier Interactive Patient Education  2017 [employer] Prevention in the Home Falls can cause injuries. They can happen to people of all ages. There are many things you can do to make your home safe and to help prevent falls. What can I do on the outside of my home?  Regularly fix the edges of walkways and driveways and fix any cracks.  Remove anything that might make you trip as you walk through a door, such as a raised step or threshold.  Trim any bushes or trees on the path to your home.  Use bright outdoor lighting.  Clear any walking paths of anything that might make someone trip, such as rocks or tools.  Regularly check to see if handrails are loose or broken. Make sure that both sides of any steps have handrails.  Any raised decks and porches should have guardrails on the edges.  Have any leaves, snow, or ice cleared regularly.  Use sand or salt on walking paths during winter.  Clean up any spills in your garage right away. This includes oil or grease spills. What can I do in the bathroom?  Use night lights.  Install grab bars by the toilet and in the tub and shower. Do not use towel bars as grab bars.  Use non-skid mats or decals in the tub or shower.  If you need to sit down in the shower, use a plastic, non-slip stool.  Keep the floor dry. Clean up any  water that spills on the floor as soon as it happens.  Remove soap buildup in the tub or shower regularly.  Attach bath mats securely with double-sided non-slip rug tape.  Do not have throw rugs and other things on the floor that can make you trip. What can I do in the bedroom?  Use night lights.  Make sure that you have a light by your bed that is easy to reach.  Do not use any sheets or blankets that are too big for your bed. They should not hang down onto the floor.  Have a firm chair that has side arms. You can use this for support while you get dressed.  Do not have throw rugs and other things on the floor that can make you trip. What can I do in the kitchen?  Clean up any spills right away.  Avoid walking on wet floors.  Keep items that you use a lot in easy-to-reach places.  If you need to reach something above you, use a strong step stool that has a grab bar.  Keep electrical cords out of the way.  Do not use floor polish or wax that makes floors slippery. If you must use wax, use non-skid  floor wax.  Do not have throw rugs and other things on the floor that can make you trip. What can I do with my stairs?  Do not leave any items on the stairs.  Make sure that there are handrails on both sides of the stairs and use them. Fix handrails that are broken or loose. Make sure that handrails are as long as the stairways.  Check any carpeting to make sure that it is firmly attached to the stairs. Fix any carpet that is loose or worn.  Avoid having throw rugs at the top or bottom of the stairs. If you do have throw rugs, attach them to the floor with carpet tape.  Make sure that you have a light switch at the top of the stairs and the bottom of the stairs. If you do not have them, ask someone to add them for you. What else can I do to help prevent falls?  Wear shoes that:  Do not have high heels.  Have rubber bottoms.  Are comfortable and fit you well.  Are closed  at the toe. Do not wear sandals.  If you use a stepladder:  Make sure that it is fully opened. Do not climb a closed stepladder.  Make sure that both sides of the stepladder are locked into place.  Ask someone to hold it for you, if possible.  Clearly mark and make sure that you can see:  Any grab bars or handrails.  First and last steps.  Where the edge of each step is.  Use tools that help you move around (mobility aids) if they are needed. These include:  Canes.  Walkers.  Scooters.  Crutches.  Turn on the lights when you go into a dark area. Replace any light bulbs as soon as they burn out.  Set up your furniture so you have a clear path. Avoid moving your furniture around.  If any of your floors are uneven, fix them.  If there are any pets around you, be aware of where they are.  Review your medicines with your doctor. Some medicines can make you feel dizzy. This can increase your chance of falling. Ask your doctor what other things that you can do to help prevent falls. This information is not intended to replace advice given to you by your health care provider. Make sure you discuss any questions you have with your health care provider. Document Released: 06/26/2009 Document Revised: 02/05/2016 Document Reviewed: 10/04/2014 Elsevier Interactive Patient Education  2017 [employer].

## 2019-07-17 NOTE — Progress Notes (Signed)
Subjective:   Heidi Mitchell is a 68 y.o. female who presents for Medicare Annual (Subsequent) preventive examination.  Review of Systems:    This visit is being conducted through telemedicine via telephone at the nurse health advisor's home address due to the COVID-19 pandemic. This patient has given me verbal consent via doximity to conduct this visit, patient states they are participating from their home address. Patient and myself are on the telephone call. There is no referral for this visit. Some vital signs may be absent or patient reported.    Patient identification: identified by name, DOB, and current address   Cardiac Risk Factors include: advanced age (>61men, >34 women);Other (see comment), Risk factor comments: hypercholesterolemia     Objective:     Vitals: LMP 11/10/2012 (LMP Unknown)   There is no height or weight on file to calculate BMI.  Advanced Directives 07/17/2019 09/27/2018 06/01/2017 03/19/2016  Does Patient Have a Medical Advance Directive? No No No No  Would patient like information on creating a medical advance directive? No - Patient declined No - Patient declined No - Patient declined Yes - Scientist, clinical (histocompatibility and immunogenetics) given    Tobacco Social History   Tobacco Use  Smoking Status Never Smoker  Smokeless Tobacco Never Used     Counseling given: Not Answered   Clinical Intake:  Pre-visit preparation completed: Yes  Pain : No/denies pain     Nutritional Risks: None Diabetes: No  How often do you need to have someone help you when you read instructions, pamphlets, or other written materials from your doctor or pharmacy?: 1 - Never What is the last grade level you completed in school?: 12th  Interpreter Needed?: No  Information entered by :: CJohnson, LPN  Past Medical History:  Diagnosis Date  . Hyperlipidemia   . Intracervical pessary   . Unspecified disorder of bladder   . Uterine prolapse    Past Surgical History:  Procedure Laterality  Date  . APPENDECTOMY  1990  . bartholins cyst  1978  . BIOPSY BREAST Right   . BREAST BIOPSY Left    benign  . COLONOSCOPY WITH PROPOFOL N/A 09/27/2018   Procedure: COLONOSCOPY WITH PROPOFOL;  Surgeon: Jonathon Bellows, MD;  Location: [facility] ENDOSCOPY;  Service: Gastroenterology;  Laterality: N/A;   Family History  Problem Relation Age of Onset  . Alcohol abuse Mother   . Uterine cancer Sister   . Other Son        hole in his brain due to been dropped when he was born   Social History   Socioeconomic History  . Marital status: Married    Spouse name: Karle Starch  . Number of children: 1  . Years of education: high school  . Highest education level: Not on file  Occupational History  . Not on file  Social Needs  . Financial resource strain: Not hard at all  . Food insecurity    Worry: Never true    Inability: Never true  . Transportation needs    Medical: No    Non-medical: No  Tobacco Use  . Smoking status: Never Smoker  . Smokeless tobacco: Never Used  Substance and Sexual Activity  . Alcohol use: No  . Drug use: No  . Sexual activity: Not Currently    Birth control/protection: Abstinence  Lifestyle  . Physical activity    Days per week: 0 days    Minutes per session: 0 min  . Stress: Not at all  Relationships  . Social  connections    Talks on phone: Not on file    Gets together: Not on file    Attends religious service: Not on file    Active member of club or organization: Not on file    Attends meetings of clubs or organizations: Not on file    Relationship status: Not on file  Other Topics Concern  . Not on file  Social History Narrative   Does not have living will   Husband and son aware of wishes.   Pt would desires CPR but would not want prolonged life support if futile.   03/06/19   From: the area   Living: husband, Karle Starch   Work: retired, but finances OK      Family: Son, Christia Reading who lives nearby      Enjoys: fishing, hiking, camping, outdoors  activity      Exercise: walking daily - 1.5 mile route that she does   Diet: not great - too much junk food, tries to do calcium      Safety   Seat belts: Yes    Guns: Yes - hunting gun, locked   Safe in relationships: Yes     Outpatient Encounter Medications as of 07/17/2019  Medication Sig  . atorvastatin (LIPITOR) 10 MG tablet TAKE 1 TABLET EVERY DAY   No facility-administered encounter medications on file as of 07/17/2019.     Activities of Daily Living In your present state of health, do you have any difficulty performing the following activities: 07/17/2019  Hearing? N  Vision? N  Difficulty concentrating or making decisions? N  Walking or climbing stairs? N  Dressing or bathing? N  Doing errands, shopping? N  Preparing Food and eating ? N  Using the Toilet? N  In the past six months, have you accidently leaked urine? N  Do you have problems with loss of bowel control? N  Managing your Medications? N  Managing your Finances? N  Housekeeping or managing your Housekeeping? N  Some recent data might be hidden    Patient Care Team: Lesleigh Noe, MD as PCP - General (Family Medicine) [place] as Consulting Physician Rubie Maid, MD as Referring Physician (Obstetrics and Gynecology)    Assessment:   This is a routine wellness examination for [name].  Exercise Activities and Dietary recommendations Current Exercise Habits: Home exercise routine, Type of exercise: walking;stretching, Time (Minutes): 30, Frequency (Times/Week): 7, Weekly Exercise (Minutes/Week): 210, Intensity: Mild, Exercise limited by: None identified  Goals    . Increase water intake     Starting 06/01/2017, I will attempt to drink at least 6-8 glasses of water daily.     . Patient Stated     07/17/2019, I will work on losing some weight by eating better and exercising.        Fall Risk Fall Risk  07/17/2019 07/13/2018 06/01/2017 03/19/2016  Falls in the past year? 0 No No No   Number falls in past yr: 0 - - -  Injury with Fall? 0 - - -  Follow up Falls evaluation completed;Falls prevention discussed - - -   Is the patient's home free of loose throw rugs in walkways, pet beds, electrical cords, etc?   yes      Grab bars in the bathroom? yes      Handrails on the stairs?   no      Adequate lighting?   yes  Timed Get Up and Go performed: N/A  Depression Screen PHQ 2/9  Scores 07/17/2019 07/13/2018 06/01/2017 03/19/2016  PHQ - 2 Score 0 0 0 0  PHQ- 9 Score 0 - 2 -     Cognitive Function MMSE - Mini Mental State Exam 07/17/2019 06/01/2017 03/19/2016  Orientation to time 5 5 5   Orientation to Place 5 5 5   Registration 3 3 3   Attention/ Calculation 5 0 0  Recall 3 3 3   Language- name 2 objects - 0 0  Language- repeat 1 1 1   Language- follow 3 step command - 3 3  Language- read & follow direction - 0 0  Write a sentence - 0 0  Copy design - 0 0  Total score - 20 20  Mini Cog  Mini-Cog screen was completed. Maximum score is 22. A value of 0 denotes this part of the MMSE was not completed or the patient failed this part of the Mini-Cog screening.       Immunization History  Administered Date(s) Administered  . Td 01/18/2002    Qualifies for Shingles Vaccine? Yes  Screening Tests Health Maintenance  Topic Date Due  . INFLUENZA VACCINE  04/14/2019  . PNA vac Low Risk Adult (1 of 2 - PCV13) 03/19/2024 (Originally 08/21/2014)  . MAMMOGRAM  08/10/2019  . COLONOSCOPY  09/27/2021  . TETANUS/TDAP  02/25/2025  . DEXA SCAN  Completed  . Hepatitis C Screening  Completed    Cancer Screenings: Lung: Low Dose CT Chest recommended if Age 60-80 years, 30 pack-year currently smoking OR have quit w/in 15years. Patient does not qualify. Breast:  Up to date on Mammogram? No, declined at this time   Up to date of Bone Density/Dexa? Yes, completed 08/09/2017 Colorectal: completed 09/27/2018  Additional Screenings:  Hepatitis C Screening: 03/19/2016     Plan:     Patient will work on losing some weight.    I have personally reviewed and noted the following in the patient's chart:   . Medical and social history . Use of alcohol, tobacco or illicit drugs  . Current medications and supplements . Functional ability and status . Nutritional status . Physical activity . Advanced directives . List of other physicians . Hospitalizations, surgeries, and ER visits in previous 12 months . Vitals . Screenings to include cognitive, depression, and falls . Referrals and appointments  In addition, I have reviewed and discussed with patient certain preventive protocols, quality metrics, and best practice recommendations. A written personalized care plan for preventive services as well as general preventive health recommendations were provided to patient.     Andrez Grime, LPN  [id number]

## 2019-07-17 NOTE — Progress Notes (Signed)
PCP notes:  Health Maintenance:  declined pneumonia, shingrix Might get flu vaccine at physical Declined mammogram at this time    Abnormal Screenings: none    Patient concerns: Patient complains of her lower legs aching at night time only. Onset 1 month ago.     Nurse concerns: none    Next PCP appt.: 07/24/2019 @ 10:20 am

## 2019-07-18 ENCOUNTER — Other Ambulatory Visit: Payer: Medicare HMO

## 2019-07-24 ENCOUNTER — Encounter: Payer: Medicare HMO | Admitting: Family Medicine

## 2019-07-31 ENCOUNTER — Encounter: Payer: Self-pay | Admitting: Family Medicine

## 2019-07-31 ENCOUNTER — Ambulatory Visit (INDEPENDENT_AMBULATORY_CARE_PROVIDER_SITE_OTHER): Payer: Medicare HMO | Admitting: Family Medicine

## 2019-07-31 ENCOUNTER — Other Ambulatory Visit: Payer: Self-pay

## 2019-07-31 VITALS — BP 128/60 | HR 54 | Temp 98.2°F | Resp 12 | Ht 60.0 in | Wt 125.5 lb

## 2019-07-31 DIAGNOSIS — R252 Cramp and spasm: Secondary | ICD-10-CM | POA: Insufficient documentation

## 2019-07-31 DIAGNOSIS — E7800 Pure hypercholesterolemia, unspecified: Secondary | ICD-10-CM

## 2019-07-31 DIAGNOSIS — Z0000 Encounter for general adult medical examination without abnormal findings: Secondary | ICD-10-CM | POA: Diagnosis not present

## 2019-07-31 DIAGNOSIS — Z23 Encounter for immunization: Secondary | ICD-10-CM

## 2019-07-31 LAB — COMPREHENSIVE METABOLIC PANEL
ALT: 12 U/L (ref 0–35)
AST: 17 U/L (ref 0–37)
Albumin: 4.3 g/dL (ref 3.5–5.2)
Alkaline Phosphatase: 65 U/L (ref 39–117)
BUN: 11 mg/dL (ref 6–23)
CO2: 28 mEq/L (ref 19–32)
Calcium: 9.1 mg/dL (ref 8.4–10.5)
Chloride: 104 mEq/L (ref 96–112)
Creatinine, Ser: 0.62 mg/dL (ref 0.40–1.20)
GFR: 95.18 mL/min (ref >60.00)
Glucose, Bld: 97 mg/dL (ref 70–99)
Potassium: 4 mEq/L (ref 3.5–5.1)
Sodium: 140 mEq/L (ref 135–145)
Total Bilirubin: 1.3 mg/dL — ABNORMAL HIGH (ref 0.2–1.2)
Total Protein: 6.9 g/dL (ref 6.0–8.3)

## 2019-07-31 LAB — FERRITIN: Ferritin: 130 ng/mL (ref 10.0–291.0)

## 2019-07-31 LAB — LIPID PANEL
Cholesterol: 158 mg/dL (ref 0–200)
HDL: 51.9 mg/dL (ref >39.00)
LDL Cholesterol: 88 mg/dL (ref 0–99)
NonHDL: 105.68
Total CHOL/HDL Ratio: 3
Triglycerides: 88 mg/dL (ref 0.0–149.0)
VLDL: 17.6 mg/dL (ref 0.0–40.0)

## 2019-07-31 NOTE — Assessment & Plan Note (Signed)
Advised increase hydration. Labs today to evaluate

## 2019-07-31 NOTE — Progress Notes (Signed)
Annual Exam   Chief Complaint:  Chief Complaint  Patient presents with  . Annual Exam    part 2    History of Present Illness:  [name] is a 68 y.o. G1P1001 who LMP was Patient's last menstrual period was 11/10/2012 (lmp unknown)., presents today for her annual examination.    #Achy calf - feels like her calfs - if her grandson hits her she will get some pain - her husband grabbed the hip - symptoms typically resolved shortly - sometimes achy at night - and first thing in the morning - severity - mild - does not limit activity - does not drink enough water daily  #food getting caught - feels like food getting stuck - when she gets heartburn she will cough   Nutrition She does get adequate calcium and Vitamin D in her diet. Diet: will make gravy biscuits, bacon+eggs, soup/sandwich, thinks she could eat more veggies Exercise: stretching exercise, walking her dog twice daily  Safety The patient wears seatbelts: yes.     The patient feels safe at home and in their relationships: yes.  GYN Not currently sexually active   Breast Cancer Screening There is no FH of breast cancer. There is no FH of ovarian cancer. BRCA screening No.  Last Mammogram: 2018 The patient does want a mammogram this year.    Colon Cancer Screening Done in 09/2018. With 3-5 year follow-up   Weight Wt Readings from Last 3 Encounters:  07/31/19 125 lb 8 oz (56.9 kg)  05/08/19 127 lb 4 oz (57.7 kg)  03/06/19 129 lb 8 oz (58.7 kg)   Patient has normal BMI  BMI Readings from Last 1 Encounters:  07/31/19 24.51 kg/m   Use to be 110-113 lb   Chronic disease screening Blood pressure monitoring:  BP Readings from Last 3 Encounters:  07/31/19 128/60  05/08/19 128/70  03/06/19 (!) 142/62    Lipid Monitoring: Indication for screening: age >74, obesity, diabetes, family hx, CV risk factors.  Lipid screening: Yes  Lab Results  Component Value Date   CHOL 256 (H) 11/02/2018    HDL 53.70 11/02/2018   LDLCALC 185 (H) 11/02/2018   LDLDIRECT 166.5 03/31/2012   TRIG 88.0 11/02/2018   CHOLHDL 5 11/02/2018     Diabetes Screening: age >72, overweight, family hx, PCOS, hx of gestational diabetes, at risk ethnicity Diabetes Screening screening: Yes  No results found for: HGBA1C   Past Medical History:  Diagnosis Date  . Hyperlipidemia   . Intracervical pessary   . Unspecified disorder of bladder   . Uterine prolapse     Past Surgical History:  Procedure Laterality Date  . APPENDECTOMY  1990  . bartholins cyst  1978  . BIOPSY BREAST Right   . BREAST BIOPSY Left    benign  . COLONOSCOPY WITH PROPOFOL N/A 09/27/2018   Procedure: COLONOSCOPY WITH PROPOFOL;  Surgeon: Jonathon Bellows, MD;  Location: [facility] ENDOSCOPY;  Service: Gastroenterology;  Laterality: N/A;    Prior to Admission medications   Medication Sig Start Date End Date Taking? Authorizing Provider  atorvastatin (LIPITOR) 10 MG tablet TAKE 1 TABLET EVERY DAY 06/21/19  Yes Lesleigh Noe, MD    Allergies  Allergen Reactions  . Sertraline Hcl     Muscle spasms    Gynecologic History: Patient's last menstrual period was 11/10/2012 (lmp unknown).  Obstetric History: G1P1001  Social History   Socioeconomic History  . Marital status: Married    Spouse name: Heidi Mitchell  . Number  of children: 1  . Years of education: high school  . Highest education level: Not on file  Occupational History  . Not on file  Social Needs  . Financial resource strain: Not hard at all  . Food insecurity    Worry: Never true    Inability: Never true  . Transportation needs    Medical: No    Non-medical: No  Tobacco Use  . Smoking status: Never Smoker  . Smokeless tobacco: Never Used  Substance and Sexual Activity  . Alcohol use: No  . Drug use: No  . Sexual activity: Not Currently    Birth control/protection: Abstinence  Lifestyle  . Physical activity    Days per week: 0 days    Minutes per session: 0 min   . Stress: Not at all  Relationships  . Social Herbalist on phone: Not on file    Gets together: Not on file    Attends religious service: Not on file    Active member of club or organization: Not on file    Attends meetings of clubs or organizations: Not on file    Relationship status: Not on file  . Intimate partner violence    Fear of current or ex partner: No    Emotionally abused: No    Physically abused: No    Forced sexual activity: No  Other Topics Concern  . Not on file  Social History Narrative   Does not have living will   Husband and son aware of wishes.   Pt would desires CPR but would not want prolonged life support if futile.   03/06/19   From: the area   Living: husband, Heidi Mitchell   Work: retired, but finances OK      Family: Son, Heidi Mitchell who lives nearby, 2 grandchildren (74 and 40 yo), and a Product manager on the way      Enjoys: fishing, hiking, camping, outdoors activity      Exercise: walking daily - 1.5 mile route that she does   Diet: not great - too much junk food, tries to do calcium      Safety   Seat belts: Yes    Guns: Yes - hunting gun, locked   Safe in relationships: Yes     Family History  Problem Relation Age of Onset  . Alcohol abuse Mother   . Uterine cancer Sister   . Other Son        hole in his brain due to been dropped when he was born    Review of Systems  Constitutional: Negative for chills and fever.  HENT: Negative for hearing loss.   Eyes: Negative.   Respiratory: Negative.   Cardiovascular: Negative.   Gastrointestinal: Negative.   Genitourinary: Negative.   Musculoskeletal:       Cramps  Skin: Negative for rash.       Dry skin  Neurological: Negative.   Endo/Heme/Allergies: Negative.   Psychiatric/Behavioral: Negative.      Physical Exam BP 128/60   Pulse (!) 54   Temp 98.2 F (36.8 C)   Resp 12   Ht 5' (1.524 m)   Wt 125 lb 8 oz (56.9 kg)   LMP 11/10/2012 (LMP Unknown)   SpO2 98%    BMI 24.51 kg/m    BP Readings from Last 3 Encounters:  07/31/19 128/60  05/08/19 128/70  03/06/19 (!) 142/62      Physical Exam Constitutional:      General: She is  not in acute distress.    Appearance: She is well-developed. She is not diaphoretic.  HENT:     Head: Normocephalic and atraumatic.     Right Ear: External ear normal.     Left Ear: External ear normal.     Nose: Nose normal.  Eyes:     General: No scleral icterus.    Conjunctiva/sclera: Conjunctivae normal.  Neck:     Musculoskeletal: Neck supple.  Cardiovascular:     Rate and Rhythm: Normal rate and regular rhythm.     Heart sounds: No murmur.  Pulmonary:     Effort: Pulmonary effort is normal. No respiratory distress.     Breath sounds: Normal breath sounds. No wheezing.  Abdominal:     General: Bowel sounds are normal. There is no distension.     Palpations: Abdomen is soft. There is no mass.     Tenderness: There is no abdominal tenderness. There is no guarding or rebound.  Musculoskeletal: Normal range of motion.  Lymphadenopathy:     Cervical: No cervical adenopathy.  Skin:    General: Skin is warm and dry.     Capillary Refill: Capillary refill takes less than 2 seconds.  Neurological:     Mental Status: She is alert and oriented to person, place, and time.     Deep Tendon Reflexes: Reflexes normal.  Psychiatric:        Behavior: Behavior normal.     Results:  PHQ-9:    Clinical Support from 07/17/2019 in [place] at [facility]  PHQ-9 Total Score  0        Assessment: 68 y.o. G76P1001 female here for routine annual physical examination.  Plan: Problem List Items Addressed This Visit      Other   Pure hypercholesterolemia   Relevant Orders   Lipid Profile   Comprehensive metabolic panel   Leg cramps    Advised increase hydration. Labs today to evaluate      Relevant Orders   Ferritin    Other Visit Diagnoses    Annual physical exam    -  Primary   Relevant  Orders   Lipid Profile   Comprehensive metabolic panel      Screening: -- Blood pressure screen normal -- cholesterol screening: will obtain - on medication -- Weight screening: normal -- Diabetes Screening: will obtain -- Nutrition: normal - encouraged eating more veggies  The 10-year ASCVD risk score Mikey Bussing DC Jr., et al., 2013) is: 9.3%   Values used to calculate the score:     Age: 60 years     Sex: Female     Is Non-Hispanic African American: No     Diabetic: No     Tobacco smoker: No     Systolic Blood Pressure: 850 mmHg     Is BP treated: No     HDL Cholesterol: 53.7 mg/dL     Total Cholesterol: 256 mg/dL  -- Statin therapy for Age 37-75 with CVD risk >7.5%  Psych -- Depression screening (PHQ-9):    Clinical Support from 07/17/2019 in [place] at [facility]  PHQ-9 Total Score  0       Safety -- tobacco screening: not using -- alcohol screening:  low-risk usage. -- no evidence of domestic violence or intimate partner violence.   Cancer Screening -- family history of breast cancer screening: done. not at high risk. -- Mammogram - pt will schedule on her own. Not ready at this time  Immunizations -- flu vaccine  up to date -- TDAP q10 years up to date -- PPSV-23 (19-64 with chronic disease or smoking) decline - Declined the shingles shot - declined pneumonia    Discussed working on regular exercise and healthy eating  Lesleigh Noe, MD

## 2019-07-31 NOTE — Patient Instructions (Addendum)
Dry Skin - [place] - thick lotion - Eucerin or Cetaphil - if a tub can be better - put it on daily - can do twice a day if severe  #Leg Cramping - drink more water  #Weight loss - try to add veggies and decrease high fat foods or limit these

## 2019-08-18 ENCOUNTER — Other Ambulatory Visit: Payer: Self-pay | Admitting: Family Medicine

## 2019-09-10 ENCOUNTER — Encounter: Payer: Self-pay | Admitting: Family Medicine

## 2019-09-10 ENCOUNTER — Ambulatory Visit (INDEPENDENT_AMBULATORY_CARE_PROVIDER_SITE_OTHER): Payer: Medicare HMO | Admitting: Family Medicine

## 2019-09-10 ENCOUNTER — Other Ambulatory Visit: Payer: Self-pay

## 2019-09-10 VITALS — BP 118/62 | HR 76 | Temp 96.6°F | Wt 127.2 lb

## 2019-09-10 DIAGNOSIS — H60391 Other infective otitis externa, right ear: Secondary | ICD-10-CM

## 2019-09-10 DIAGNOSIS — H6121 Impacted cerumen, right ear: Secondary | ICD-10-CM

## 2019-09-10 MED ORDER — NEOMYCIN-POLYMYXIN-HC 1 % OT SOLN: 3.0000 [drp] | Freq: Four times a day (QID) | OTIC | 0 refills | Status: DC

## 2019-09-10 NOTE — Patient Instructions (Signed)
Ear itching - keep an eye on your symptoms - if they do not improve in 24-48 hours start the ear drops - Return if symptoms do not resolve after 7-10 days of ear drops

## 2019-09-10 NOTE — Progress Notes (Signed)
   Subjective:     Heidi Mitchell is a 68 y.o. female presenting for Ears feel stopped up (x 2 weeks. Itching present.)     HPI  #Ear pain - x 2 weeks - feel itchy deep inside the ear - no fever/chills - difficulty hearing - Treatment: peroxide - right ear worse than left  Review of Systems  HENT: Positive for ear pain and hearing loss. Negative for congestion, postnasal drip, rhinorrhea and tinnitus.   Eyes: Negative for itching.     Social History   Tobacco Use  Smoking Status Never Smoker  Smokeless Tobacco Never Used        Objective:    BP Readings from Last 3 Encounters:  09/10/19 118/62  07/31/19 128/60  05/08/19 128/70   Wt Readings from Last 3 Encounters:  09/10/19 127 lb 4 oz (57.7 kg)  07/31/19 125 lb 8 oz (56.9 kg)  05/08/19 127 lb 4 oz (57.7 kg)    BP 118/62   Pulse 76   Temp (!) 96.6 F (35.9 C)   Wt 127 lb 4 oz (57.7 kg)   LMP 11/10/2012 (LMP Unknown)   SpO2 98%   BMI 24.85 kg/m    Physical Exam Constitutional:      General: She is not in acute distress.    Appearance: She is well-developed. She is not diaphoretic.  HENT:     Right Ear: External ear normal. There is impacted cerumen.     Left Ear: Tympanic membrane, ear canal and external ear normal.     Ears:     Comments: Post removal of ear wax: normal TM, mild erythema to ear canal, gross hearing improved    Nose:     Comments: Wearing mask Eyes:     Conjunctiva/sclera: Conjunctivae normal.  Cardiovascular:     Rate and Rhythm: Normal rate.  Pulmonary:     Effort: Pulmonary effort is normal.  Musculoskeletal:     Cervical back: Neck supple.  Skin:    General: Skin is warm and dry.     Capillary Refill: Capillary refill takes less than 2 seconds.  Neurological:     Mental Status: She is alert. Mental status is at baseline.  Psychiatric:        Mood and Affect: Mood normal.        Behavior: Behavior normal.           Assessment & Plan:   Problem List Items  Addressed This Visit    None    Visit Diagnoses    Other infective acute otitis externa of right ear    -  Primary   Relevant Medications   NEOMYCIN-POLYMYXIN-HYDROCORTISONE (CORTISPORIN) 1 % SOLN OTIC solution   Hearing loss of right ear due to cerumen impaction         Advised watch and wait to see if itching and hearing continue to improve Trial of abx drops if no resolution in 2 days   Return if symptoms worsen or fail to improve.  Lesleigh Noe, MD

## 2019-10-25 DIAGNOSIS — Z0100 Encounter for examination of eyes and vision without abnormal findings: Secondary | ICD-10-CM | POA: Diagnosis not present

## 2019-10-25 DIAGNOSIS — H524 Presbyopia: Secondary | ICD-10-CM | POA: Diagnosis not present

## 2019-10-25 DIAGNOSIS — E7800 Pure hypercholesterolemia, unspecified: Secondary | ICD-10-CM | POA: Diagnosis not present

## 2019-10-25 DIAGNOSIS — H25813 Combined forms of age-related cataract, bilateral: Secondary | ICD-10-CM | POA: Diagnosis not present

## 2020-01-01 ENCOUNTER — Other Ambulatory Visit: Payer: Self-pay | Admitting: Family Medicine

## 2020-01-01 DIAGNOSIS — Z1231 Encounter for screening mammogram for malignant neoplasm of breast: Secondary | ICD-10-CM

## 2020-01-03 ENCOUNTER — Ambulatory Visit
Admission: RE | Admit: 2020-01-03 | Discharge: 2020-01-03 | Disposition: A | Discharge status: Home / Self Care | Payer: Medicare HMO | Source: Ambulatory Visit | Attending: Family Medicine | Admitting: Family Medicine

## 2020-01-03 ENCOUNTER — Other Ambulatory Visit: Payer: Self-pay

## 2020-01-03 DIAGNOSIS — Z1231 Encounter for screening mammogram for malignant neoplasm of breast: Secondary | ICD-10-CM | POA: Diagnosis not present

## 2020-07-28 ENCOUNTER — Ambulatory Visit: Payer: Medicare HMO

## 2020-07-28 ENCOUNTER — Other Ambulatory Visit: Payer: Medicare HMO

## 2020-08-05 ENCOUNTER — Encounter: Payer: Medicare HMO | Admitting: Family Medicine

## 2020-08-22 ENCOUNTER — Other Ambulatory Visit: Payer: Self-pay

## 2020-08-22 ENCOUNTER — Ambulatory Visit (INDEPENDENT_AMBULATORY_CARE_PROVIDER_SITE_OTHER): Payer: Medicare HMO

## 2020-08-22 DIAGNOSIS — Z0000 Encounter for general adult medical examination without abnormal findings: Secondary | ICD-10-CM

## 2020-08-22 NOTE — Patient Instructions (Signed)
Heidi Mitchell , Thank you for taking time to come for your Medicare Wellness Visit. I appreciate your ongoing commitment to your health goals. Please review the following plan we discussed and let me know if I can assist you in the future.   Screening recommendations/referrals: Colonoscopy: Up to date, completed 09/27/2018, due 09/2021 Mammogram: Up to date, completed 01/03/2020 due 12/2020 Bone Density: due, will discuss with provider at upcoming physical  Recommended yearly ophthalmology/optometry visit for glaucoma screening and checkup Recommended yearly dental visit for hygiene and checkup  Vaccinations: Influenza vaccine: due, will get at upcoming physical  Pneumococcal vaccine: declined Tdap vaccine: Up to date, completed 02/26/2015, due 02/2025 Shingles vaccine: due, check with your insurance regarding coverage/cost if interested   Covid-19:declined   Advanced directives: Advance directive discussed with you today. Even though you declined this today please call our office should you change your mind and we can give you the proper paperwork for you to fill out.  Conditions/risks identified: hypercholesterolemia  Next appointment: Follow up in one year for your annual wellness visit    Preventive Care 56 Years and Older, Female Preventive care refers to lifestyle choices and visits with your health care provider that can promote health and wellness. What does preventive care include?  A yearly physical exam. This is also called an annual well check.  Dental exams once or twice a year.  Routine eye exams. Ask your health care provider how often you should have your eyes checked.  Personal lifestyle choices, including:  Daily care of your teeth and gums.  Regular physical activity.  Eating a healthy diet.  Avoiding tobacco and drug use.  Limiting alcohol use.  Practicing safe sex.  Taking low-dose aspirin every day.  Taking vitamin and mineral supplements as recommended  by your health care provider. What happens during an annual well check? The services and screenings done by your health care provider during your annual well check will depend on your age, overall health, lifestyle risk factors, and family history of disease. Counseling  Your health care provider may ask you questions about your:  Alcohol use.  Tobacco use.  Drug use.  Emotional well-being.  Home and relationship well-being.  Sexual activity.  Eating habits.  History of falls.  Memory and ability to understand (cognition).  Work and work Statistician.  Reproductive health. Screening  You may have the following tests or measurements:  Height, weight, and BMI.  Blood pressure.  Lipid and cholesterol levels. These may be checked every 5 years, or more frequently if you are over 49 years old.  Skin check.  Lung cancer screening. You may have this screening every year starting at age 74 if you have a 30-pack-year history of smoking and currently smoke or have quit within the past 15 years.  Fecal occult blood test (FOBT) of the stool. You may have this test every year starting at age 14.  Flexible sigmoidoscopy or colonoscopy. You may have a sigmoidoscopy every 5 years or a colonoscopy every 10 years starting at age 83.  Hepatitis C blood test.  Hepatitis B blood test.  Sexually transmitted disease (STD) testing.  Diabetes screening. This is done by checking your blood sugar (glucose) after you have not eaten for a while (fasting). You may have this done every 1-3 years.  Bone density scan. This is done to screen for osteoporosis. You may have this done starting at age 88.  Mammogram. This may be done every 1-2 years. Talk to your health  care provider about how often you should have regular mammograms. Talk with your health care provider about your test results, treatment options, and if necessary, the need for more tests. Vaccines  Your health care provider may  recommend certain vaccines, such as:  Influenza vaccine. This is recommended every year.  Tetanus, diphtheria, and acellular pertussis (Tdap, Td) vaccine. You may need a Td booster every 10 years.  Zoster vaccine. You may need this after age 52.  Pneumococcal 13-valent conjugate (PCV13) vaccine. One dose is recommended after age 39.  Pneumococcal polysaccharide (PPSV23) vaccine. One dose is recommended after age 71. Talk to your health care provider about which screenings and vaccines you need and how often you need them. This information is not intended to replace advice given to you by your health care provider. Make sure you discuss any questions you have with your health care provider. Document Released: 09/26/2015 Document Revised: 05/19/2016 Document Reviewed: 07/01/2015 Elsevier Interactive Patient Education  2017 [place] Prevention in the Home Falls can cause injuries. They can happen to people of all ages. There are many things you can do to make your home safe and to help prevent falls. What can I do on the outside of my home?  Regularly fix the edges of walkways and driveways and fix any cracks.  Remove anything that might make you trip as you walk through a door, such as a raised step or threshold.  Trim any bushes or trees on the path to your home.  Use bright outdoor lighting.  Clear any walking paths of anything that might make someone trip, such as rocks or tools.  Regularly check to see if handrails are loose or broken. Make sure that both sides of any steps have handrails.  Any raised decks and porches should have guardrails on the edges.  Have any leaves, snow, or ice cleared regularly.  Use sand or salt on walking paths during winter.  Clean up any spills in your garage right away. This includes oil or grease spills. What can I do in the bathroom?  Use night lights.  Install grab bars by the toilet and in the tub and shower. Do not use towel  bars as grab bars.  Use non-skid mats or decals in the tub or shower.  If you need to sit down in the shower, use a plastic, non-slip stool.  Keep the floor dry. Clean up any water that spills on the floor as soon as it happens.  Remove soap buildup in the tub or shower regularly.  Attach bath mats securely with double-sided non-slip rug tape.  Do not have throw rugs and other things on the floor that can make you trip. What can I do in the bedroom?  Use night lights.  Make sure that you have a light by your bed that is easy to reach.  Do not use any sheets or blankets that are too big for your bed. They should not hang down onto the floor.  Have a firm chair that has side arms. You can use this for support while you get dressed.  Do not have throw rugs and other things on the floor that can make you trip. What can I do in the kitchen?  Clean up any spills right away.  Avoid walking on wet floors.  Keep items that you use a lot in easy-to-reach places.  If you need to reach something above you, use a strong step stool that has a grab  bar.  Keep electrical cords out of the way.  Do not use floor polish or wax that makes floors slippery. If you must use wax, use non-skid floor wax.  Do not have throw rugs and other things on the floor that can make you trip. What can I do with my stairs?  Do not leave any items on the stairs.  Make sure that there are handrails on both sides of the stairs and use them. Fix handrails that are broken or loose. Make sure that handrails are as long as the stairways.  Check any carpeting to make sure that it is firmly attached to the stairs. Fix any carpet that is loose or worn.  Avoid having throw rugs at the top or bottom of the stairs. If you do have throw rugs, attach them to the floor with carpet tape.  Make sure that you have a light switch at the top of the stairs and the bottom of the stairs. If you do not have them, ask someone to  add them for you. What else can I do to help prevent falls?  Wear shoes that:  Do not have high heels.  Have rubber bottoms.  Are comfortable and fit you well.  Are closed at the toe. Do not wear sandals.  If you use a stepladder:  Make sure that it is fully opened. Do not climb a closed stepladder.  Make sure that both sides of the stepladder are locked into place.  Ask someone to hold it for you, if possible.  Clearly mark and make sure that you can see:  Any grab bars or handrails.  First and last steps.  Where the edge of each step is.  Use tools that help you move around (mobility aids) if they are needed. These include:  Canes.  Walkers.  Scooters.  Crutches.  Turn on the lights when you go into a dark area. Replace any light bulbs as soon as they burn out.  Set up your furniture so you have a clear path. Avoid moving your furniture around.  If any of your floors are uneven, fix them.  If there are any pets around you, be aware of where they are.  Review your medicines with your doctor. Some medicines can make you feel dizzy. This can increase your chance of falling. Ask your doctor what other things that you can do to help prevent falls. This information is not intended to replace advice given to you by your health care provider. Make sure you discuss any questions you have with your health care provider. Document Released: 06/26/2009 Document Revised: 02/05/2016 Document Reviewed: 10/04/2014 Elsevier Interactive Patient Education  2017 [employer].

## 2020-08-22 NOTE — Progress Notes (Signed)
PCP notes:  Health Maintenance: Flu- due Covid- declined Dexa- due   Abnormal Screenings: none   Patient concerns: Chest discomfort/regurgitation onset several years ago    Nurse concerns: none   Next PCP appt.: 09/17/2020 @ 9:20 am

## 2020-08-22 NOTE — Progress Notes (Signed)
Subjective:   Heidi Mitchell is a 69 y.o. female who presents for Medicare Annual (Subsequent) preventive examination.  Review of Systems: N/A      I connected with the patient today by telephone and verified that I am speaking with the correct person using two identifiers. Location patient: home Location nurse: work Persons participating in the telephone visit: patient, nurse.   I discussed the limitations, risks, security and privacy concerns of performing an evaluation and management service by telephone and the availability of in person appointments. I also discussed with the patient that there may be a patient responsible charge related to this service. The patient expressed understanding and verbally consented to this telephonic visit.        Cardiac Risk Factors include: advanced age (>59men, >24 women);Other (see comment), Risk factor comments: hypercholesterolemia     Objective:    Today's Vitals   There is no height or weight on file to calculate BMI.  Advanced Directives 08/22/2020 07/17/2019 09/27/2018 06/01/2017 03/19/2016  Does Patient Have a Medical Advance Directive? No No No No No  Would patient like information on creating a medical advance directive? No - Patient declined No - Patient declined No - Patient declined No - Patient declined Yes - Educational materials given    Current Medications (verified) Outpatient Encounter Medications as of 08/22/2020  Medication Sig  . atorvastatin (LIPITOR) 10 MG tablet TAKE 1 TABLET EVERY DAY  . NEOMYCIN-POLYMYXIN-HYDROCORTISONE (CORTISPORIN) 1 % SOLN OTIC solution Place 3 drops into both ears 4 (four) times daily. (Patient not taking: Reported on 08/22/2020)   No facility-administered encounter medications on file as of 08/22/2020.    Allergies (verified) Sertraline hcl   History: Past Medical History:  Diagnosis Date  . Hyperlipidemia   . Intracervical pessary   . Unspecified disorder of bladder   . Uterine  prolapse    Past Surgical History:  Procedure Laterality Date  . APPENDECTOMY  1990  . bartholins cyst  1978  . BIOPSY BREAST Right   . BREAST BIOPSY Left    benign  . COLONOSCOPY WITH PROPOFOL N/A 09/27/2018   Procedure: COLONOSCOPY WITH PROPOFOL;  Surgeon: Jonathon Bellows, MD;  Location: [facility] ENDOSCOPY;  Service: Gastroenterology;  Laterality: N/A;   Family History  Problem Relation Age of Onset  . Alcohol abuse Mother   . Uterine cancer Sister   . Other Son        hole in his brain due to been dropped when he was born   Social History   Socioeconomic History  . Marital status: Married    Spouse name: Karle Starch  . Number of children: 1  . Years of education: high school  . Highest education level: Not on file  Occupational History  . Not on file  Tobacco Use  . Smoking status: Never Smoker  . Smokeless tobacco: Never Used  Vaping Use  . Vaping Use: Never used  Substance and Sexual Activity  . Alcohol use: No  . Drug use: No  . Sexual activity: Not Currently    Birth control/protection: Abstinence  Other Topics Concern  . Not on file  Social History Narrative   Does not have living will   Husband and son aware of wishes.   Pt would desires CPR but would not want prolonged life support if futile.   03/06/19   From: the area   Living: husband, Karle Starch   Work: retired, but finances OK      Family: Son, Christia Reading who lives  nearby, 2 grandchildren (52 and 51 yo), and a great-granddaughter on the way      Enjoys: fishing, hiking, camping, outdoors activity      Exercise: walking daily - 1.5 mile route that she does   Diet: not great - too much junk food, tries to do calcium      Safety   Seat belts: Yes    Guns: Yes - hunting gun, locked   Safe in relationships: Yes    Social Determinants of Health   Financial Resource Strain: Low Risk   . Difficulty of Paying Living Expenses: Not hard at all  Food Insecurity: No Food Insecurity  . Worried About Sales executive in the Last Year: Never true  . Ran Out of Food in the Last Year: Never true  Transportation Needs: No Transportation Needs  . Lack of Transportation (Medical): No  . Lack of Transportation (Non-Medical): No  Physical Activity: Sufficiently Active  . Days of Exercise per Week: 7 days  . Minutes of Exercise per Session: 30 min  Stress: No Stress Concern Present  . Feeling of Stress : Not at all  Social Connections: Not on file    Tobacco Counseling Counseling given: Not Answered   Clinical Intake:  Pre-visit preparation completed: Yes  Pain : 0-10 Pain Type: Chronic pain Pain Location: Chest Pain Descriptors / Indicators: Sore Pain Onset: More than a month ago Pain Frequency: Intermittent     Nutritional Risks: None Diabetes: No  How often do you need to have someone help you when you read instructions, pamphlets, or other written materials from your doctor or pharmacy?: 1 - Never What is the last grade level you completed in school?: 12th  Diabetic: No Nutrition Risk Assessment:  Has the patient had any N/V/D within the last 2 months?  No  Does the patient have any non-healing wounds?  No  Has the patient had any unintentional weight loss or weight gain?  No   Diabetes:  Is the patient diabetic?  No  If diabetic, was a CBG obtained today?  N/A Did the patient bring in their glucometer from home?  N/A How often do you monitor your CBG's? N/A.   Financial Strains and Diabetes Management:  Are you having any financial strains with the device, your supplies or your medication? N/A.  Does the patient want to be seen by Chronic Care Management for management of their diabetes?  N/A Would the patient like to be referred to a Nutritionist or for Diabetic Management?  N/A   Interpreter Needed?: No  Information entered by :: CJohnson, LPN   Activities of Daily Living In your present state of health, do you have any difficulty performing the following  activities: 08/22/2020  Hearing? N  Vision? N  Difficulty concentrating or making decisions? N  Walking or climbing stairs? N  Dressing or bathing? N  Doing errands, shopping? N  Preparing Food and eating ? N  Using the Toilet? N  In the past six months, have you accidently leaked urine? Y  Comment leakage sometimes, does not wear pads  Do you have problems with loss of bowel control? N  Managing your Medications? N  Managing your Finances? N  Housekeeping or managing your Housekeeping? N  Some recent data might be hidden    Patient Care Team: Lesleigh Noe, MD as PCP - General (Family Medicine) [place] as Consulting Physician Rubie Maid, MD as Referring Physician (Obstetrics and Gynecology)  Indicate any  recent Medical Services you may have received from other than [facility] providers in the past year (date may be approximate).     Assessment:   This is a routine wellness examination for [place].  Hearing/Vision screen  Hearing Screening   125Hz  250Hz  500Hz  1000Hz  2000Hz  3000Hz  4000Hz  6000Hz  8000Hz   Right ear:           Left ear:           Vision Screening Comments: Patient gets annual eye exams   Dietary issues and exercise activities discussed: Current Exercise Habits: Home exercise routine, Type of exercise: walking, Time (Minutes): 30, Frequency (Times/Week): 7, Weekly Exercise (Minutes/Week): 210, Intensity: Moderate, Exercise limited by: None identified  Goals    . Increase water intake     Starting 06/01/2017, I will attempt to drink at least 6-8 glasses of water daily.     . Patient Stated     07/17/2019, I will work on losing some weight by eating better and exercising.     . Patient Stated     08/22/2020, I will continue to walk my dog daily for about 30 minutes.       Depression Screen PHQ 2/9 Scores 08/22/2020 07/17/2019 07/13/2018 06/01/2017 03/19/2016  PHQ - 2 Score 0 0 0 0 0  PHQ- 9 Score 0 0 - 2 -    Fall Risk Fall Risk  08/22/2020  07/17/2019 07/13/2018 06/01/2017 03/19/2016  Falls in the past year? 0 0 No No No  Number falls in past yr: 0 0 - - -  Injury with Fall? 0 0 - - -  Risk for fall due to : No Fall Risks - - - -  Follow up Falls evaluation completed;Falls prevention discussed Falls evaluation completed;Falls prevention discussed - - -    FALL RISK PREVENTION PERTAINING TO THE HOME:  Any stairs in or around the home? Yes  If so, are there any without handrails? No  Home free of loose throw rugs in walkways, pet beds, electrical cords, etc? Yes  Adequate lighting in your home to reduce risk of falls? Yes   ASSISTIVE DEVICES UTILIZED TO PREVENT FALLS:  Life alert? No  Use of a cane, walker or w/c? No  Grab bars in the bathroom? No  Shower chair or bench in shower? No  Elevated toilet seat or a handicapped toilet? No   TIMED UP AND GO:  Was the test performed? N/A, telephone visit.   Cognitive Function: MMSE - Mini Mental State Exam 08/22/2020 07/17/2019 06/01/2017 03/19/2016  Orientation to time 5 5 5 5   Orientation to Place 5 5 5 5   Registration 3 3 3 3   Attention/ Calculation 5 5 0 0  Recall 3 3 3 3   Language- name 2 objects - - 0 0  Language- repeat 1 1 1 1   Language- follow 3 step command - - 3 3  Language- read & follow direction - - 0 0  Write a sentence - - 0 0  Copy design - - 0 0  Total score - - 20 20  Mini Cog  Mini-Cog screen was completed. Maximum score is 22. A value of 0 denotes this part of the MMSE was not completed or the patient failed this part of the Mini-Cog screening.       Immunizations Immunization History  Administered Date(s) Administered  . Influenza,inj,Quad PF,6+ Mos 07/31/2019  . Td 01/18/2002    TDAP status: Up to date  Flu Vaccine status: Due, Education has been  provided regarding the importance of this vaccine. Advised may receive this vaccine at local pharmacy or Health Dept. Aware to provide a copy of the vaccination record if obtained from local  pharmacy or Health Dept. Verbalized acceptance and understanding. Will get when she comes in for physical.  Pneumococcal vaccine status: Declined,  Education has been provided regarding the importance of this vaccine but patient still declined. Advised may receive this vaccine at local pharmacy or Health Dept. Aware to provide a copy of the vaccination record if obtained from local pharmacy or Health Dept. Verbalized acceptance and understanding.   Covid-19 vaccine status: Declined, Education has been provided regarding the importance of this vaccine but patient still declined. Advised may receive this vaccine at local pharmacy or Health Dept.or vaccine clinic. Aware to provide a copy of the vaccination record if obtained from local pharmacy or Health Dept. Verbalized acceptance and understanding.  Qualifies for Shingles Vaccine? Yes   Zostavax completed No   Shingrix Completed?: No.    Education has been provided regarding the importance of this vaccine. Patient has been advised to call insurance company to determine out of pocket expense if they have not yet received this vaccine. Advised may also receive vaccine at local pharmacy or Health Dept. Verbalized acceptance and understanding.  Screening Tests Health Maintenance  Topic Date Due  . INFLUENZA VACCINE  04/13/2020  . COVID-19 Vaccine (1) 09/07/2021 (Originally 08/21/1963)  . PNA vac Low Risk Adult (1 of 2 - PCV13) 03/19/2024 (Originally 08/21/2014)  . COLONOSCOPY  09/27/2021  . MAMMOGRAM  01/02/2022  . TETANUS/TDAP  02/25/2025  . DEXA SCAN  Completed  . Hepatitis C Screening  Completed    Health Maintenance  Health Maintenance Due  Topic Date Due  . INFLUENZA VACCINE  04/13/2020    Colorectal cancer screening: Type of screening: Colonoscopy. Completed 09/27/2018. Repeat every 3 years  Mammogram status: Completed 01/03/2020. Repeat every year  Bone Density status: due, will discuss with provider at upcoming physical   Lung  Cancer Screening: (Low Dose CT Chest recommended if Age 37-80 years, 30 pack-year currently smoking OR have quit w/in 15years.) does not qualify.    Additional Screening:  Hepatitis C Screening: does qualify; Completed 03/19/2016  Vision Screening: Recommended annual ophthalmology exams for early detection of glaucoma and other disorders of the eye. Is the patient up to date with their annual eye exam?  Yes  Who is the provider or what is the name of the office in which the patient attends annual eye exams? [facility] If pt is not established with a provider, would they like to be referred to a provider to establish care? No .   Dental Screening: Recommended annual dental exams for proper oral hygiene  Community Resource Referral / Chronic Care Management: CRR required this visit?  No   CCM required this visit?  No      Plan:     I have personally reviewed and noted the following in the patient's chart:   . Medical and social history . Use of alcohol, tobacco or illicit drugs  . Current medications and supplements . Functional ability and status . Nutritional status . Physical activity . Advanced directives . List of other physicians . Hospitalizations, surgeries, and ER visits in previous 12 months . Vitals . Screenings to include cognitive, depression, and falls . Referrals and appointments  In addition, I have reviewed and discussed with patient certain preventive protocols, quality metrics, and best practice recommendations. A written personalized  care plan for preventive services as well as general preventive health recommendations were provided to patient.   Due to this being a telephonic visit, the after visit summary with patients personalized plan was offered to patient via office or my-chart. Patient preferred to pick up at office at next visit or via mychart.   Andrez Grime, LPN   00/08/3934

## 2020-09-17 ENCOUNTER — Ambulatory Visit (INDEPENDENT_AMBULATORY_CARE_PROVIDER_SITE_OTHER): Payer: Medicare HMO | Admitting: Family Medicine

## 2020-09-17 ENCOUNTER — Encounter: Payer: Self-pay | Admitting: Family Medicine

## 2020-09-17 ENCOUNTER — Other Ambulatory Visit: Payer: Self-pay

## 2020-09-17 VITALS — BP 110/70 | HR 70 | Temp 97.9°F | Ht 60.0 in | Wt 121.5 lb

## 2020-09-17 DIAGNOSIS — Z23 Encounter for immunization: Secondary | ICD-10-CM

## 2020-09-17 DIAGNOSIS — E7800 Pure hypercholesterolemia, unspecified: Secondary | ICD-10-CM

## 2020-09-17 DIAGNOSIS — Z0000 Encounter for general adult medical examination without abnormal findings: Secondary | ICD-10-CM | POA: Diagnosis not present

## 2020-09-17 LAB — COMPREHENSIVE METABOLIC PANEL
ALT: 15 U/L (ref 0–35)
AST: 24 U/L (ref 0–37)
Albumin: 4.3 g/dL (ref 3.5–5.2)
Alkaline Phosphatase: 64 U/L (ref 39–117)
BUN: 17 mg/dL (ref 6–23)
CO2: 27 mEq/L (ref 19–32)
Calcium: 8.8 mg/dL (ref 8.4–10.5)
Chloride: 106 mEq/L (ref 96–112)
Creatinine, Ser: 0.66 mg/dL (ref 0.40–1.20)
GFR: 88.47 mL/min (ref >60.00)
Glucose, Bld: 90 mg/dL (ref 70–99)
Potassium: 3.9 mEq/L (ref 3.5–5.1)
Sodium: 139 mEq/L (ref 135–145)
Total Bilirubin: 1.4 mg/dL — ABNORMAL HIGH (ref 0.2–1.2)
Total Protein: 6.8 g/dL (ref 6.0–8.3)

## 2020-09-17 LAB — LIPID PANEL
Cholesterol: 140 mg/dL (ref 0–200)
HDL: 48.3 mg/dL (ref >39.00)
LDL Cholesterol: 79 mg/dL (ref 0–99)
NonHDL: 92.04
Total CHOL/HDL Ratio: 3
Triglycerides: 67 mg/dL (ref 0.0–149.0)
VLDL: 13.4 mg/dL (ref 0.0–40.0)

## 2020-09-17 MED ORDER — ATORVASTATIN CALCIUM 10 MG PO TABS: 10.0000 mg | ORAL_TABLET | Freq: Every day | ORAL | 3 refills | Status: DC

## 2020-09-17 NOTE — Progress Notes (Addendum)
Annual Exam   Chief Complaint:  Chief Complaint  Patient presents with  . Medicare Wellness    No concerns   . Gastroesophageal Reflux    X 2 months     History of Present Illness:  [name] is a 71 y.o. G1P1001 who LMP was Patient's last menstrual period was 11/10/2012 (lmp unknown)., presents today for her annual examination.     Nutrition She does get adequate calcium and Vitamin D in her diet. Diet: tries to eat veggies, some fruit Exercise: walking, stretching in the morning daily walking the dog  Safety The patient wears seatbelts: yes.     The patient feels safe at home and in their relationships: yes.   Menstrual:  Symptoms of menopause: none   GYN She is not sexually active.    Cervical Cancer Screening (21-65):   No longer indicated  Breast Cancer Screening (Age 63-74):  There is no FH of breast cancer. There is no FH of ovarian cancer. BRCA screening Not Indicated.  Last Mammogram: 01/03/2020 The patient does want a mammogram this year.    Colon Cancer Screening:  Age 71-75 yo - benefits outweigh the risk. Adults 71-85 yo who have never been screened benefit.  Benefits: 134000 people in 2016 will be diagnosed and 49,000 will die - early detection helps [place]: Complications 2/2 to colonoscopy High Risk (Colonoscopy): genetic disorder (Lynch syndrome or familial adenomatous polyposis), personal hx of IBD, previous adenomatous polyp, or previous colorectal cancer, FamHx start 10 years before the age at diagnosis, increased in males and black race  Options:  FIT - looks for hemoglobin (blood in the stool) - specific and fairly sensitive - must be done annually Cologuard - looks for DNA and blood - more sensitive - therefore can have more false positives, every 3 years Colonoscopy - every 10 years if normal - sedation, bowl prep, must have someone drive you  Shared decision making and the patient had decided to do colonoscopy due in  2023.   Social History   Tobacco Use  Smoking Status Never Smoker  Smokeless Tobacco Never Used    Lung Cancer Screening (Ages 35-36): not applicable   Weight Wt Readings from Last 3 Encounters:  09/17/20 121 lb 8 oz (55.1 kg)  09/10/19 127 lb 4 oz (57.7 kg)  07/31/19 125 lb 8 oz (56.9 kg)   Patient has normal BMI  BMI Readings from Last 1 Encounters:  09/17/20 23.73 kg/m     Chronic disease screening Blood pressure monitoring:  BP Readings from Last 3 Encounters:  09/17/20 110/70  09/10/19 118/62  07/31/19 128/60    Lipid Monitoring: Indication for screening: age >4, obesity, diabetes, family hx, CV risk factors.  Lipid screening: Yes  Lab Results  Component Value Date   CHOL 158 07/31/2019   HDL 51.90 07/31/2019   [place] 88 07/31/2019   LDLDIRECT 166.5 03/31/2012   TRIG 88.0 07/31/2019   CHOLHDL 3 07/31/2019     Diabetes Screening: age >58, overweight, family hx, PCOS, hx of gestational diabetes, at risk ethnicity Diabetes Screening screening: Yes  No results found for: HGBA1C   Past Medical History:  Diagnosis Date  . Hyperlipidemia   . Intracervical pessary   . Unspecified disorder of bladder   . Uterine prolapse     Past Surgical History:  Procedure Laterality Date  . APPENDECTOMY  1990  . bartholins cyst  1978  . BIOPSY BREAST Right   . BREAST BIOPSY Left  benign  . COLONOSCOPY WITH PROPOFOL N/A 09/27/2018   Procedure: COLONOSCOPY WITH PROPOFOL;  Surgeon: Jonathon Bellows, MD;  Location: [facility] ENDOSCOPY;  Service: Gastroenterology;  Laterality: N/A;    Prior to Admission medications   Medication Sig Start Date End Date Taking? Authorizing Provider  atorvastatin (LIPITOR) 10 MG tablet TAKE 1 TABLET EVERY DAY 08/20/19  Yes Lesleigh Noe, MD  famotidine (PEPCID) 10 MG tablet Take 10 mg by mouth as needed for heartburn or indigestion.   Yes [provider]    Allergies  Allergen Reactions  . Sertraline Hcl     Muscle spasms     Gynecologic History: Patient's last menstrual period was 11/10/2012 (lmp unknown).  Obstetric History: G1P1001  Social History   Socioeconomic History  . Marital status: Married    Spouse name: Karle Starch  . Number of children: 1  . Years of education: high school  . Highest education level: Not on file  Occupational History  . Not on file  Tobacco Use  . Smoking status: Never Smoker  . Smokeless tobacco: Never Used  Vaping Use  . Vaping Use: Never used  Substance and Sexual Activity  . Alcohol use: No  . Drug use: No  . Sexual activity: Not Currently    Birth control/protection: Abstinence  Other Topics Concern  . Not on file  Social History Narrative   Does not have living will   Husband and son aware of wishes.   Pt would desires CPR but would not want prolonged life support if futile.   03/06/19   From: the area   Living: husband, Karle Starch   Work: retired, but finances OK      Family: Son, Christia Reading who lives nearby, 2 grandchildren (37 and 27 yo), and a Product manager on the way      Enjoys: fishing, hiking, camping, outdoors activity      Exercise: walking daily - 1.5 mile route that she does   Diet: not great - too much junk food, tries to do calcium      Safety   Seat belts: Yes    Guns: Yes - hunting gun, locked   Safe in relationships: Yes    Social Determinants of Health   Financial Resource Strain: [place]   . Difficulty of Paying Living Expenses: Not hard at all  Food Insecurity: No Food Insecurity  . Worried About Charity fundraiser in the Last Year: Never true  . Ran Out of Food in the Last Year: Never true  Transportation Needs: No Transportation Needs  . Lack of Transportation (Medical): No  . Lack of Transportation (Non-Medical): No  Physical Activity: Sufficiently Active  . Days of Exercise per Week: 7 days  . Minutes of Exercise per Session: 30 min  Stress: No Stress Concern Present  . Feeling of Stress : Not at all  Social  Connections: Not on file  Intimate Partner Violence: Not At Risk  . Fear of Current or Ex-Partner: No  . Emotionally Abused: No  . Physically Abused: No  . Sexually Abused: No    Family History  Problem Relation Age of Onset  . Alcohol abuse Mother   . Uterine cancer Sister   . Other Son        hole in his brain due to been dropped when he was born    Review of Systems  Constitutional: Negative for chills and fever.  HENT: Negative for congestion and sore throat.   Eyes: Negative for blurred vision  and double vision.  Respiratory: Negative for shortness of breath.   Cardiovascular: Negative for chest pain.  Gastrointestinal: Positive for heartburn. Negative for nausea and vomiting.  Genitourinary: Negative.   Musculoskeletal: Negative.  Negative for myalgias.  Skin: Negative for rash.  Neurological: Negative for dizziness and headaches.  Endo/Heme/Allergies: Does not bruise/bleed easily.  Psychiatric/Behavioral: Negative for depression. The patient is not nervous/anxious.      Physical Exam BP 110/70   Pulse 70   Temp 97.9 F (36.6 C) (Temporal)   Ht 5' (1.524 m)   Wt 121 lb 8 oz (55.1 kg)   LMP 11/10/2012 (LMP Unknown)   SpO2 95%   BMI 23.73 kg/m    BP Readings from Last 3 Encounters:  09/17/20 110/70  09/10/19 118/62  07/31/19 128/60      Physical Exam Constitutional:      General: She is not in acute distress.    Appearance: She is well-developed and well-nourished. She is not diaphoretic.  HENT:     Head: Normocephalic and atraumatic.     Right Ear: External ear normal.     Left Ear: External ear normal.     Nose: Nose normal.     Mouth/Throat:     Mouth: Oropharynx is clear and moist.  Eyes:     General: No scleral icterus.    Extraocular Movements: EOM normal.     Conjunctiva/sclera: Conjunctivae normal.  Cardiovascular:     Rate and Rhythm: Normal rate and regular rhythm.     Heart sounds: No murmur heard.   Pulmonary:     Effort:  Pulmonary effort is normal. No respiratory distress.     Breath sounds: Normal breath sounds. No wheezing.  Abdominal:     General: Bowel sounds are normal. There is no distension.     Palpations: Abdomen is soft. There is no mass.     Tenderness: There is no abdominal tenderness. There is no guarding or rebound.  Musculoskeletal:        General: No edema. Normal range of motion.     Cervical back: Neck supple.  Lymphadenopathy:     Cervical: No cervical adenopathy.  Skin:    General: Skin is warm and dry.     Capillary Refill: Capillary refill takes less than 2 seconds.  Neurological:     Mental Status: She is alert and oriented to person, place, and time.     Deep Tendon Reflexes: Strength normal. Reflexes normal.  Psychiatric:        Mood and Affect: Mood and affect normal.        Behavior: Behavior normal.     Results:  PHQ-9:  Flowsheet Row Clinical Support from 08/22/2020 in [place] at [facility]  PHQ-9 Total Score 0        Assessment: 70 y.o. G9P1001 female here for routine annual physical examination.  Plan: Problem List Items Addressed This Visit      Other   Pure hypercholesterolemia   Relevant Medications   atorvastatin (LIPITOR) 10 MG tablet   Other Relevant Orders   Comprehensive metabolic panel   Lipid panel    Other Visit Diagnoses    Annual physical exam    -  Primary   Need for influenza vaccination       Relevant Orders   Flu Vaccine QUAD 36+ mos IM (Completed)   Need for vaccination with 13-polyvalent pneumococcal conjugate vaccine       Relevant Orders   Pneumococcal conjugate vaccine 13-valent IM (  Completed)      Screening: -- Blood pressure screen normal -- cholesterol screening: will obtain -- Weight screening: normal -- Diabetes Screening: will obtain -- Nutrition: Encouraged healthy diet  The 10-year ASCVD risk score Mikey Bussing DC Jr., et al., 2013) is: 7.6%   Values used to calculate the score:     Age: 33 years      Sex: Female     Is Non-Hispanic African American: No     Diabetic: No     Tobacco smoker: No     Systolic Blood Pressure: 278 mmHg     Is BP treated: No     HDL Cholesterol: 51.9 mg/dL     Total Cholesterol: 158 mg/dL  -- Statin therapy for Age 32-75 with CVD risk >7.5%  Psych -- Depression screening (PHQ-9):  Flowsheet Row Clinical Support from 08/22/2020 in [place] at [facility]  PHQ-9 Total Score 0       Safety -- tobacco screening: not using -- alcohol screening:  low-risk usage. -- no evidence of domestic violence or intimate partner violence.   Cancer Screening -- pap smear not collected per ASCCP guidelines -- family history of breast cancer screening: done. not at high risk. -- Mammogram - ordered -- Colon cancer (age 80+)-- 3 year recall, due next year  Immunizations Immunization History  Administered Date(s) Administered  . Influenza,inj,Quad PF,6+ Mos 07/31/2019, 09/17/2020  . Pneumococcal Conjugate-13 09/17/2020  . Td 01/18/2002    -- flu vaccine up to date -- TDAP q10 years unknown, record requested -- Shingles (age >45) decline -- PPSV-23 (19-64 with chronic disease or smoking) - not yet due -- PCV-13 (age >40) - one dose followed by PPSV-23 1 year later up to date -- Covid-19 Vaccine declined   Encouraged healthy diet and exercise. Encouraged regular vision and dental care.    Lesleigh Noe, MD

## 2020-09-17 NOTE — Patient Instructions (Signed)
If getting daily symptoms - would recommend Omeprazole 20 mg for 2 weeks and return if not improved   Heartburn Heartburn is a type of pain or discomfort that can happen in the throat or chest. It is often described as a burning pain. It may also cause a bad, acid-like taste in the mouth. Heartburn may feel worse when you lie down or bend over. It may be worse at night. It may be caused by stomach contents that move back up (reflux) into the tube that connects the mouth with the stomach (esophagus). Follow these instructions at home: Eating and drinking   Avoid certain foods and drinks as told by your doctor. This may include: ? Coffee and tea (with or without caffeine). ? Drinks that have alcohol. ? Energy drinks and sports drinks. ? Carbonated drinks or sodas. ? Chocolate and cocoa. ? Peppermint and mint flavorings. ? Garlic and onions. ? Horseradish. ? Spicy and acidic foods, such as:  Peppers.  Chili powder and curry powder.  Vinegar.  Hot sauces and BBQ sauce. ? Citrus fruit juices and citrus fruits, such as:  Oranges.  Lemons.  Limes. ? Tomato-based foods, such as:  Red sauce and pizza with red sauce.  Chili.  Salsa. ? Fried and fatty foods, such as:  Donuts.  [place] fries and potato chips.  High-fat dressings. ? High-fat meats, such as:  Hot dogs and sausage.  Rib eye steak.  Ham and bacon. ? High-fat dairy items, such as:  Whole milk.  Butter.  Cream cheese.  Eat small meals often. Avoid eating large meals.  Avoid drinking large amounts of liquid with your meals.  Avoid eating meals during the 2-3 hours before bedtime.  Avoid lying down right after you eat.  Do not exercise right after you eat. Lifestyle      If you are overweight, lose an amount of weight that is healthy for you. Ask your doctor about a safe weight loss goal.  Do not use any products that contain nicotine or tobacco, including cigarettes, e-cigarettes, and  chewing tobacco. These can make your symptoms worse. If you need help quitting, ask your doctor.  Wear loose clothes. Do not wear anything tight around your waist.  Raise (elevate) the head of your bed about 6 inches (15 cm) when you sleep.  Try to lower your stress. If you need help doing this, ask your doctor. General instructions  Pay attention to any changes in your symptoms.  Take over-the-counter and prescription medicines only as told by your doctor. ? Do not take aspirin, ibuprofen, or other NSAIDs unless your doctor says it is okay. ? Stop medicines only as told by your doctor.  Keep all follow-up visits as told by your doctor. This is important. Contact a doctor if:  You have new symptoms.  You lose weight and you do not know why it is happening.  You have trouble swallowing, or it hurts to swallow.  You have wheezing or a cough that keeps happening.  Your symptoms do not get better with treatment.  You have heartburn often for more than 2 weeks. Get help right away if:  You have pain in your arms, neck, jaw, teeth, or back.  You feel sweaty, dizzy, or light-headed.  You have chest pain or shortness of breath.  You throw up (vomit) and your throw up looks like blood or coffee grounds.  Your poop (stool) is bloody or black. These symptoms may represent a serious problem that is an  emergency. Do not wait to see if the symptoms will go away. Get medical help right away. Call your local emergency services (911 in the [place] Do not drive yourself to the hospital. Summary  Heartburn is a type of pain that can happen in the throat or chest. It can feel like a burning pain. It may also cause a bad, acid-like taste in the mouth.  You may need to avoid certain foods and drinks to help your symptoms. Ask your doctor what foods and drinks you should avoid.  Take over-the-counter and prescription medicines only as told by your doctor. Do not take aspirin, ibuprofen, or  other NSAIDs unless your doctor told you to do so.  Contact your doctor if your symptoms do not get better or they get worse. This information is not intended to replace advice given to you by your health care provider. Make sure you discuss any questions you have with your health care provider. Document Revised: 01/30/2018 Document Reviewed: 01/30/2018 Elsevier Patient Education  [place].

## 2020-09-20 ENCOUNTER — Other Ambulatory Visit: Payer: Self-pay

## 2020-09-20 ENCOUNTER — Ambulatory Visit (HOSPITAL_COMMUNITY)
Admission: EM | Admit: 2020-09-20 | Discharge: 2020-09-20 | Disposition: A | Discharge status: Home / Self Care | Payer: Medicare HMO | Attending: Urgent Care | Admitting: Urgent Care

## 2020-09-20 ENCOUNTER — Encounter (HOSPITAL_COMMUNITY): Payer: Self-pay | Admitting: Urgent Care

## 2020-09-20 DIAGNOSIS — B9789 Other viral agents as the cause of diseases classified elsewhere: Secondary | ICD-10-CM | POA: Diagnosis not present

## 2020-09-20 DIAGNOSIS — R0602 Shortness of breath: Secondary | ICD-10-CM

## 2020-09-20 DIAGNOSIS — J988 Other specified respiratory disorders: Secondary | ICD-10-CM

## 2020-09-20 DIAGNOSIS — U071 COVID-19: Secondary | ICD-10-CM | POA: Diagnosis not present

## 2020-09-20 MED ORDER — PSEUDOEPHEDRINE HCL 30 MG PO TABS: 30.0000 mg | ORAL_TABLET | Freq: Three times a day (TID) | ORAL | 0 refills | Status: DC | PRN

## 2020-09-20 MED ORDER — PROMETHAZINE-DM 6.25-15 MG/5ML PO SYRP: 5.0000 mL | ORAL_SOLUTION | Freq: Every evening | ORAL | 0 refills | Status: DC | PRN

## 2020-09-20 MED ORDER — CETIRIZINE HCL 10 MG PO TABS: 10.0000 mg | ORAL_TABLET | Freq: Every day | ORAL | 0 refills | Status: DC

## 2020-09-20 MED ORDER — BENZONATATE 100 MG PO CAPS: 100.0000 mg | ORAL_CAPSULE | Freq: Three times a day (TID) | ORAL | 0 refills | Status: DC | PRN

## 2020-09-20 NOTE — Discharge Instructions (Addendum)

## 2020-09-20 NOTE — ED Provider Notes (Signed)
[facility]   MRN: [id number] DOB: 10-Mar-1951  Subjective:   Heidi Mitchell is a 70 y.o. female presenting for 3-day history of acute onset sinus pressure, sinus congestion, shortness of breath, cough that elicits rib pain, shortness of breath.  Patient denies history of asthma, smoking history.  She is not COVID vaccinated.    No current facility-administered medications for this encounter.  Current Outpatient Medications:  .  benzonatate (TESSALON) 100 MG capsule, Take 1-2 capsules (100-200 mg total) by mouth 3 (three) times daily as needed for cough., Disp: 60 capsule, Rfl: 0 .  cetirizine (ZYRTEC ALLERGY) 10 MG tablet, Take 1 tablet (10 mg total) by mouth daily., Disp: 30 tablet, Rfl: 0 .  promethazine-dextromethorphan (PROMETHAZINE-DM) 6.25-15 MG/5ML syrup, Take 5 mLs by mouth at bedtime as needed for cough., Disp: 100 mL, Rfl: 0 .  pseudoephedrine (SUDAFED) 30 MG tablet, Take 1 tablet (30 mg total) by mouth every 8 (eight) hours as needed for congestion., Disp: 30 tablet, Rfl: 0 .  atorvastatin (LIPITOR) 10 MG tablet, Take 1 tablet (10 mg total) by mouth daily., Disp: 90 tablet, Rfl: 3 .  famotidine (PEPCID) 10 MG tablet, Take 10 mg by mouth as needed for heartburn or indigestion., Disp: , Rfl:    Allergies  Allergen Reactions  . Sertraline Hcl     Muscle spasms    Past Medical History:  Diagnosis Date  . Hyperlipidemia   . Intracervical pessary   . Unspecified disorder of bladder   . Uterine prolapse      Past Surgical History:  Procedure Laterality Date  . APPENDECTOMY  1990  . bartholins cyst  1978  . BIOPSY BREAST Right   . BREAST BIOPSY Left    benign  . COLONOSCOPY WITH PROPOFOL N/A 09/27/2018   Procedure: COLONOSCOPY WITH PROPOFOL;  Surgeon: Jonathon Bellows, MD;  Location: [facility] ENDOSCOPY;  Service: Gastroenterology;  Laterality: N/A;    Family History  Problem Relation Age of Onset  . Alcohol abuse Mother   . Uterine cancer Sister   . Other  Son        hole in his brain due to been dropped when he was born    Social History   Tobacco Use  . Smoking status: Never Smoker  . Smokeless tobacco: Never Used  Vaping Use  . Vaping Use: Never used  Substance Use Topics  . Alcohol use: No  . Drug use: No    ROS   Objective:   Vitals: BP (!) 129/54 (BP Location: Left Arm)   Pulse 64   Temp 98.6 F (37 C) (Oral)   Resp 18   LMP 11/10/2012 (LMP Unknown)   SpO2 100%   Physical Exam Constitutional:      General: She is not in acute distress.    Appearance: Normal appearance. She is well-developed. She is not ill-appearing, toxic-appearing or diaphoretic.  HENT:     Head: Normocephalic and atraumatic.     Nose: Nose normal.     Mouth/Throat:     Mouth: Mucous membranes are moist.  Eyes:     Extraocular Movements: Extraocular movements intact.     Pupils: Pupils are equal, round, and reactive to light.  Cardiovascular:     Rate and Rhythm: Normal rate and regular rhythm.     Pulses: Normal pulses.     Heart sounds: Normal heart sounds. No murmur heard. No friction rub. No gallop.   Pulmonary:     Effort: Pulmonary effort  is normal. No respiratory distress.     Breath sounds: Normal breath sounds. No stridor. No wheezing, rhonchi or rales.  Skin:    General: Skin is warm and dry.     Findings: No rash.  Neurological:     Mental Status: She is alert and oriented to person, place, and time.  Psychiatric:        Mood and Affect: Mood normal.        Behavior: Behavior normal.        Thought Content: Thought content normal.      Assessment and Plan :   PDMP not reviewed this encounter.  1. Viral respiratory illness   2. SOB (shortness of breath)     Patient has clear lung sounds, stable vital signs for outpatient management.  Suspect COVID-19, viral URI.  Recommend supportive care.  Testing is pending.  Counseled patient on potential for adverse effects with medications prescribed/recommended today, ER and  return-to-clinic precautions discussed, patient verbalized understanding.    Jaynee Eagles, [place] 09/20/20 1641

## 2020-09-20 NOTE — ED Triage Notes (Signed)
Pt states that she has  HA, SOB, dizziness, sore ribs from coughing, and loss of appetite. Pt states that sx started four days ago.

## 2020-09-21 LAB — SARS CORONAVIRUS 2 (TAT 6-24 HRS): SARS Coronavirus 2: POSITIVE — AB

## 2020-09-22 ENCOUNTER — Encounter: Payer: Self-pay | Admitting: Physician Assistant

## 2020-09-22 ENCOUNTER — Telehealth: Payer: Self-pay

## 2020-09-22 ENCOUNTER — Telehealth: Payer: Self-pay | Admitting: Physician Assistant

## 2020-09-22 DIAGNOSIS — Z720 Tobacco use: Secondary | ICD-10-CM | POA: Insufficient documentation

## 2020-09-22 NOTE — Telephone Encounter (Signed)
Called to discuss with Heidi Mitchell about Covid symptoms and the use of sotrovimab, remdisivir or oral therapies for those with mild to moderate Covid symptoms and at a high risk of hospitalization.     Pt is qualified due to co-morbid conditions and/or a member of an at-risk group (age, high SVI), however declines infusion at this time. Symptoms tier reviewed as well as criteria for ending isolation.  Symptoms reviewed that would warrant ED/Hospital evaluation. Preventative practices reviewed. Patient verbalized understanding. Patient advised to call back if he decides that he does want to get infusion. Callback number to the infusion center given. Patient advised to go to Urgent care or ED with severe symptoms.  Pt offered sotrovimab (on day 6) but pt only interested in oral therapy and declines infusion at this time.     Patient Active Problem List   Diagnosis Date Noted  . Tobacco abuse   . Leg cramps 07/31/2019  . Dizziness 03/06/2019  . Bradycardia 03/06/2019  . Pure hypercholesterolemia 07/29/2017    [name] PA-C

## 2020-09-22 NOTE — Telephone Encounter (Signed)
Noted  

## 2020-09-22 NOTE — Telephone Encounter (Signed)
Pt was seen at [facility] on 1/8 and has already been offered treatment.

## 2020-09-22 NOTE — Telephone Encounter (Signed)
[facility] Night - Client TELEPHONE ADVICE RECORD [facility] Patient Name: Heidi Mitchell Gender: Female DOB: [birth year] Age: 70 Y 30 D Return Phone Number: [phone] (Primary) Address: City/State/Zip: [place] [address] Client [facility] Night - Client Client Site [place] Physician Waunita Schooner- MD Contact Type Call Who Is Calling Patient / Member / Family / Caregiver Call Type Triage / Clinical Relationship To Patient Self Return Phone Number [phone] (Primary) Chief Complaint BREATHING - shortness of breath or sounds breathless Reason for Call Symptomatic / Request for [place] states she is having trouble breathing and thinks she has covid-19. Translation No Nurse Assessment Nurse: Ronnald Ramp, RN, Miranda Date/Time (Eastern Time): 09/20/2020 1:09:39 PM Confirm and document reason for call. If symptomatic, describe symptoms. ---Caller states has a cough and when she takes a deep breath it makes her cough. She has had symptoms for 3 days. She is also having nausea. Does the patient have any new or worsening symptoms? ---Yes Will a triage be completed? ---Yes Related visit to physician within the last 2 weeks? ---No Does the PT have any chronic conditions? (i.e. diabetes, asthma, this includes High risk factors for pregnancy, etc.) ---Yes List chronic conditions. ---High Cholesterol Is this a behavioral health or substance abuse call? ---No Guidelines Guideline Title Affirmed Question Affirmed Notes Nurse Date/Time ([place] Time) COVID-19 - Diagnosed or Suspected MILD difficulty breathing (e.g., minimal/no SOB at rest, SOB with walking, pulse <100) Ronnald Ramp, RN, Miranda 09/20/2020 1:11:02 PM Disp. Time Eilene Ghazi Time) Disposition Final User 09/20/2020 1:08:38 PM Send to Urgent Queue Dorris Fetch 09/20/2020 1:15:32 PM See HCP within 4 Hours (or  PCP triage) Yes Ronnald Ramp, RN, Miranda PLEASE NOTE: All timestamps contained within this report are represented as [place] Standard Time. CONFIDENTIALTY NOTICE: This fax transmission is intended only for the addressee. It contains information that is legally privileged, confidential or otherwise protected from use or disclosure. If you are not the intended recipient, you are strictly prohibited from reviewing, disclosing, copying using or disseminating any of this information or taking any action in reliance on or regarding this information. If you have received this fax in error, please notify us immediately by telephone so that we can arrange for its return to [name]. Phone: [phone], Toll-Free: [phone], Fax: [phone] Page: 2 of 2 Call Id: [id number] [place] Disagree/Comply Comply Caller Understands Yes PreDisposition Did not know what to do Care Advice Given Per Guideline SEE HCP (OR PCP TRIAGE) WITHIN 4 HOURS: * IF OFFICE WILL BE CLOSED AND NO PCP (PRIMARY CARE PROVIDER) SECOND-LEVEL TRIAGE: You need to be seen within the next 3 or 4 hours. A nearby Urgent Care Center [facility]) is often a good source of care. Another choice is to go to the ED. Go sooner if you become worse. CALL BACK IF: * You become worse CARE ADVICE given per COVID-19 - DIAGNOSED OR SUSPECTED (Adult) guideline. * UCC: Some UCCs can manage patients who are stable and have less serious symptoms (e.g., minor illnesses and injuries). The triager must know the [facility] capabilities before sending a patient there. If unsure, call ahead. Referrals [facility] [place]

## 2020-09-24 ENCOUNTER — Emergency Department (HOSPITAL_COMMUNITY): Payer: Medicare HMO

## 2020-09-24 ENCOUNTER — Emergency Department (HOSPITAL_COMMUNITY)
Admission: EM | Admit: 2020-09-24 | Discharge: 2020-09-24 | Disposition: A | Discharge status: Home / Self Care | Payer: Medicare HMO | Attending: Emergency Medicine | Admitting: Emergency Medicine

## 2020-09-24 ENCOUNTER — Telehealth: Payer: Self-pay

## 2020-09-24 ENCOUNTER — Other Ambulatory Visit: Payer: Self-pay

## 2020-09-24 DIAGNOSIS — R059 Cough, unspecified: Secondary | ICD-10-CM | POA: Diagnosis not present

## 2020-09-24 DIAGNOSIS — J189 Pneumonia, unspecified organism: Secondary | ICD-10-CM | POA: Diagnosis not present

## 2020-09-24 DIAGNOSIS — R0602 Shortness of breath: Secondary | ICD-10-CM | POA: Diagnosis not present

## 2020-09-24 DIAGNOSIS — U071 COVID-19: Secondary | ICD-10-CM | POA: Insufficient documentation

## 2020-09-24 LAB — CBC
HCT: 39.1 % (ref 36.0–46.0)
Hemoglobin: 12.1 g/dL (ref 12.0–15.0)
MCH: 28.1 pg (ref 26.0–34.0)
MCHC: 30.9 g/dL (ref 30.0–36.0)
MCV: 90.7 fL (ref 80.0–100.0)
Platelets: 180 10*3/uL (ref 150–400)
RBC: 4.31 MIL/uL (ref 3.87–5.11)
RDW: 14.6 % (ref 11.5–15.5)
WBC: 7.2 10*3/uL (ref 4.0–10.5)
nRBC: 0 % (ref 0.0–0.2)

## 2020-09-24 LAB — BASIC METABOLIC PANEL
Anion gap: 11 (ref 5–15)
BUN: 13 mg/dL (ref 8–23)
CO2: 22 mmol/L (ref 22–32)
Calcium: 8.3 mg/dL — ABNORMAL LOW (ref 8.9–10.3)
Chloride: 104 mmol/L (ref 98–111)
Creatinine, Ser: 0.64 mg/dL (ref 0.44–1.00)
GFR, Estimated: >60 mL/min (ref >60)
Glucose, Bld: 105 mg/dL — ABNORMAL HIGH (ref 70–99)
Potassium: 3.1 mmol/L — ABNORMAL LOW (ref 3.5–5.1)
Sodium: 137 mmol/L (ref 135–145)

## 2020-09-24 MED ORDER — POTASSIUM CHLORIDE ER 10 MEQ PO TBCR: 10.0000 meq | EXTENDED_RELEASE_TABLET | Freq: Every day | ORAL | 0 refills | Status: DC

## 2020-09-24 MED ORDER — ALBUTEROL SULFATE HFA 108 (90 BASE) MCG/ACT IN AERS
2.0000 | INHALATION_SPRAY | RESPIRATORY_TRACT | Status: DC | PRN
  Administered 2020-09-24: 2 via RESPIRATORY_TRACT
  Filled 2020-09-24: qty 6.7

## 2020-09-24 MED ORDER — POTASSIUM CHLORIDE CRYS ER 20 MEQ PO TBCR
20.0000 meq | EXTENDED_RELEASE_TABLET | Freq: Once | ORAL | Status: AC
  Administered 2020-09-24: 20 meq via ORAL
  Filled 2020-09-24: qty 1

## 2020-09-24 NOTE — Telephone Encounter (Signed)
[place] Day - Client TELEPHONE ADVICE RECORD [facility] Patient Name: Heidi Mitchell Gender: Female DOB: 1951/02/28 Age: 70 Y 1 M 3 D Return Phone Number: [phone] (Primary) Address: City/State/Zip: [place] [address] Client [place] Day - Client Client Site [place] - Day Physician Waunita Schooner- MD Contact Type Call Who Is Calling Patient / Member / Family / Caregiver Call Type Triage / Clinical Relationship To Patient Self Return Phone Number [phone] (Primary) Chief Complaint BREATHING - shortness of breath or sounds breathless Reason for Call Symptomatic / Request for [place] states she tested positive for Covid and she is having difficulty breathing. [facility] Translation No Nurse Assessment Nurse: Rock Nephew, RN, Juliann Pulse Date/Time (Eastern Time): 09/24/2020 9:00:45 AM Confirm and document reason for call. If symptomatic, describe symptoms. ---Caller states she tested positive for COVID and she is having difficulty breathing. Does the patient have any new or worsening symptoms? ---Yes Will a triage be completed? ---Yes Related visit to physician within the last 2 weeks? ---Yes Does the PT have any chronic conditions? (i.e. diabetes, asthma, this includes High risk factors for pregnancy, etc.) ---No Is this a behavioral health or substance abuse call? ---No Guidelines Guideline Title Affirmed Question Affirmed Notes Nurse Date/Time ([place] Time) COVID-19 - Diagnosed or Suspected MODERATE difficulty breathing (e.g., speaks in phrases, SOB even at rest, pulse 100-120) Rock Nephew, RN, Juliann Pulse 09/24/2020 9:01:09 AM Disp. Time Eilene Ghazi Time) Disposition Final User 09/24/2020 8:59:35 AM Send to Urgent Queue Wine, Teddi 09/24/2020 9:03:51 AM Go to ED Now Yes Rock Nephew, RN, Gara Kroner Disagree/Comply Comply PLEASE NOTE: All timestamps  contained within this report are represented as [place] Standard Time. CONFIDENTIALTY NOTICE: This fax transmission is intended only for the addressee. It contains information that is legally privileged, confidential or otherwise protected from use or disclosure. If you are not the intended recipient, you are strictly prohibited from reviewing, disclosing, copying using or disseminating any of this information or taking any action in reliance on or regarding this information. If you have received this fax in error, please notify us immediately by telephone so that we can arrange for its return to [name]. Phone: [phone], Toll-Free: [phone], Fax: [phone] Page: 2 of 2 Call Id: [id number] [place] Understands Yes PreDisposition Call Doctor Care Advice Given Per Guideline GO TO ED NOW: * You need to be seen in the Emergency Department. WEAR A MASK - COVER YOUR MOUTH AND NOSE: * Wear a mask. ANOTHER ADULT SHOULD DRIVE: * It is better and safer if another adult drives instead of you. * Tell the first healthcare worker you meet that you may have COVID-19. CARE ADVICE given per COVID-19 - DIAGNOSED OR SUSPECTED (Adult) guideline. CALL EMS 911 IF: * Severe difficulty breathing occurs Referrals GO TO FACILITY OTHER - SPECIFY

## 2020-09-24 NOTE — Telephone Encounter (Signed)
Noted  

## 2020-09-24 NOTE — Discharge Instructions (Signed)
Echo today showed that you do have a left lower lobe pneumonia.  This is likely due to COVID-19 as discussed.  Since this is a virus, we do not treat this with antibiotics.  Continue to take your medications as needed.  You may buy a pulse oximeter over-the-counter at any pharmacy, this measures your oxygen levels in your blood.  If you feel short of breath and your oxygen levels are below 90%, please return to the ER.  Please make sure to follow-up with your primary care doctor about the symptoms you are experiencing today.  Your potassium was also low today, please take the supplements daily.

## 2020-09-24 NOTE — ED Notes (Signed)
Walking test performed  Passed 02 sat 95% on RA while walking for 3 min

## 2020-09-24 NOTE — ED Triage Notes (Signed)
Pt dx with covid on Saturday, continues with shob and cough. Meds given by UC not helping. Endorses nausea without vomiting today.

## 2020-09-24 NOTE — ED Provider Notes (Signed)
[facility] EMERGENCY DEPARTMENT Provider Note   CSN: [id number] Arrival date & time: 09/24/20  1001     History Chief Complaint  Patient presents with  . Shortness of Breath    Heidi Mitchell is a 70 y.o. female.  HPI 70 year old female with a history of hyperlipidemia, uterine prolapse presents to the ER with complaints of shortness of breath in setting of a recent COVID-19 diagnosis 1 week ago.  States she feels like she cannot take a deep breath in.  Denies any pain on inspiration.  Denies any hemoptysis.  No chest pain.  Felt weak and tired.  She has been taking over-the-counter Tessalon Perles, Robitussin, dextromethorphan, albuterol and Zyrtec with little relief.  This appears to have been prescribed to her at her urgent care visit on 09/20/2020.  Denies any leg swelling, nausea, vomiting, abdominal pain.     Past Medical History:  Diagnosis Date  . Hyperlipidemia   . Intracervical pessary   . Unspecified disorder of bladder   . Uterine prolapse     Patient Active Problem List   Diagnosis Date Noted  . Tobacco abuse   . Leg cramps 07/31/2019  . Dizziness 03/06/2019  . Bradycardia 03/06/2019  . Pure hypercholesterolemia 07/29/2017    Past Surgical History:  Procedure Laterality Date  . APPENDECTOMY  1990  . bartholins cyst  1978  . BIOPSY BREAST Right   . BREAST BIOPSY Left    benign  . COLONOSCOPY WITH PROPOFOL N/A 09/27/2018   Procedure: COLONOSCOPY WITH PROPOFOL;  Surgeon: Jonathon Bellows, MD;  Location[facility] ENDOSCOPY;  Service: Gastroenterology;  Laterality: N/A;     OB History    Gravida  1   Para  1   Term  1   Preterm      AB      Living  1     SAB      IAB      Ectopic      Multiple      Live Births  1           Family History  Problem Relation Age of Onset  . Alcohol abuse Mother   . Uterine cancer Sister   . Other Son        hole in his brain due to been dropped when he was born    Social History    Tobacco Use  . Smoking status: Never Smoker  . Smokeless tobacco: Never Used  Vaping Use  . Vaping Use: Never used  Substance Use Topics  . Alcohol use: No  . Drug use: No    Home Medications Prior to Admission medications   Medication Sig Start Date End Date Taking? Authorizing Provider  potassium chloride (KLOR-CON) 10 MEQ tablet Take 1 tablet (10 mEq total) by mouth daily. 09/24/20 10/24/20 Yes Garald Balding, PA-C  atorvastatin (LIPITOR) 10 MG tablet Take 1 tablet (10 mg total) by mouth daily. 09/17/20   Lesleigh Noe, MD  benzonatate (TESSALON) 100 MG capsule Take 1-2 capsules (100-200 mg total) by mouth 3 (three) times daily as needed for cough. 09/20/20   Jaynee Eagles, PA-C  cetirizine (ZYRTEC ALLERGY) 10 MG tablet Take 1 tablet (10 mg total) by mouth daily. 09/20/20   Jaynee Eagles, PA-C  famotidine (PEPCID) 10 MG tablet Take 10 mg by mouth as needed for heartburn or indigestion.    [provider]  promethazine-dextromethorphan (PROMETHAZINE-DM) 6.25-15 MG/5ML syrup Take 5 mLs by mouth at bedtime as  needed for cough. 09/20/20   Jaynee Eagles, PA-C  pseudoephedrine (SUDAFED) 30 MG tablet Take 1 tablet (30 mg total) by mouth every 8 (eight) hours as needed for congestion. 09/20/20   Jaynee Eagles, PA-C    Allergies    Sertraline hcl  Review of Systems   Review of Systems  Constitutional: Positive for activity change, appetite change, chills and fatigue. Negative for fever.  Respiratory: Positive for cough and shortness of breath.   Cardiovascular: Negative for chest pain.  Gastrointestinal: Negative for abdominal pain, diarrhea, nausea and vomiting.  Genitourinary: Negative for dysuria.  Neurological: Positive for weakness.    Physical Exam Updated Vital Signs BP 117/61   Pulse 69   Temp 98.4 F (36.9 C) (Oral)   Resp (!) 21   LMP 11/10/2012 (LMP Unknown)   SpO2 99%   Physical Exam Vitals and nursing note reviewed.  Constitutional:      General: She is not in acute  distress.    Appearance: She is well-developed and well-nourished. She is not toxic-appearing or diaphoretic.  HENT:     Head: Normocephalic and atraumatic.  Eyes:     Conjunctiva/sclera: Conjunctivae normal.  Cardiovascular:     Rate and Rhythm: Normal rate and regular rhythm.     Heart sounds: No murmur heard.   Pulmonary:     Effort: Pulmonary effort is normal. No tachypnea, accessory muscle usage or respiratory distress.     Breath sounds: Normal breath sounds. No decreased breath sounds, wheezing, rhonchi or rales.     Comments: Lungs mildly decreased throughout.  No significant wheezes noted. Abdominal:     Palpations: Abdomen is soft.     Tenderness: There is no abdominal tenderness.  Musculoskeletal:        General: No edema.     Cervical back: Neck supple.     Right lower leg: No tenderness. No edema.     Left lower leg: No tenderness. No edema.  Skin:    General: Skin is warm and dry.  Neurological:     General: No focal deficit present.     Mental Status: She is alert.  Psychiatric:        Mood and Affect: Mood and affect and mood normal.      ED Results / Procedures / Treatments   Labs (all labs ordered are listed, but only abnormal results are displayed) Labs Reviewed  BASIC METABOLIC PANEL - Abnormal; Notable for the following components:      Result Value   Potassium 3.1 (*)    Glucose, Bld 105 (*)    Calcium 8.3 (*)    All other components within normal limits  CBC    EKG EKG Interpretation  Date/Time:  Wednesday September 24 2020 10:36:11 EST Ventricular Rate:  70 PR Interval:  140 QRS Duration: 70 QT Interval:  398 QTC Calculation: 429 R Axis:   -12 Text Interpretation: Normal sinus rhythm Left ventricular hypertrophy with repolarization abnormality ( R in aVL ) Abnormal ECG NSR, no STEMI Confirmed by Lavenia Atlas [phone]) on 09/24/2020 5:13:30 PM   Radiology DG Chest Portable 1 View  Result Date: 09/24/2020 CLINICAL DATA:  Shortness of  breath, cough for 1 week, COVID-19 positive EXAM: PORTABLE CHEST 1 VIEW COMPARISON:  Portable exam 1054 hours without priors for comparison FINDINGS: Upper normal heart size. Mediastinal contours and pulmonary vascularity normal. Atherosclerotic calcification aorta. Minimal peribronchial thickening with hazy opacity in the mid to lower LEFT lung suspicious for infiltrate/pneumonia. Remaining lungs clear. No  pleural effusion or pneumothorax. Mild dextroconvex thoracic scoliosis. IMPRESSION: Bronchitic changes with hazy opacities in the mid to lower LEFT lung suspicious for pneumonia. Electronically Signed   By: Lavonia Dana M.D.   On: 09/24/2020 11:17    Procedures Procedures (including critical care time)  Medications Ordered in ED Medications  albuterol (VENTOLIN HFA) 108 (90 Base) MCG/ACT inhaler 2 puff (2 puffs Inhalation Given 09/24/20 1041)  potassium chloride SA (KLOR-CON) CR tablet 20 mEq (has no administration in time range)    ED Course  I have reviewed the triage vital signs and the nursing notes.  Pertinent labs & imaging results that were available during my care of the patient were reviewed by me and considered in my medical decision making (see chart for details).    MDM Rules/Calculators/A&P                          70 year old female who presents to the ER with complaints of shortness of breath in setting of COVID-19.  On arrival, she is alert, oriented, nontoxic-appearing, no acute distress, resting comfortably in ER bed.  Vitals overall reassuring, no evidence of hypoxia, tachycardia or tachypnea on my exam.  Lung sounds mildly decreased.  CBC without leukocytosis, BMP with a potassium of 3.1, no other significant abnormalities noted.  Chest x-ray confirms a left lower lobe pneumonia, likely viral source secondary to recent COVID-19 infection.  EKG reviewed by myself and my supervising physician, normal sinus rhythm.  Patient ambulating here in the ER without evidence of hypoxia.   Oral potassium was repleted, will send home with potassium.  Encouraged PCP follow-up.  Return precautions discussed.  She voiced understanding and is agreeable.  This was a shared visit with my supervising physician Dr. Dina Rich who independently saw and evaluated the patient & provided guidance in evaluation/management/disposition ,in agreement with care  Final Clinical Impression(s) / ED Diagnoses Final diagnoses:  Shortness of breath  COVID-19    Rx / DC Orders ED Discharge Orders         Ordered    potassium chloride (KLOR-CON) 10 MEQ tablet  Daily        09/24/20 1721           Garald Balding, PA-C 09/24/20 1721    Lorelle Gibbs, DO 09/24/20 1842

## 2020-09-24 NOTE — ED Notes (Signed)
Discharged   Verbalized understanding

## 2020-09-24 NOTE — Telephone Encounter (Signed)
Per chart review tab pt is at [facility]. 

## 2020-10-01 ENCOUNTER — Telehealth (INDEPENDENT_AMBULATORY_CARE_PROVIDER_SITE_OTHER): Payer: Medicare HMO | Admitting: Family Medicine

## 2020-10-01 ENCOUNTER — Encounter: Payer: Self-pay | Admitting: Family Medicine

## 2020-10-01 VITALS — HR 78

## 2020-10-01 DIAGNOSIS — U071 COVID-19: Secondary | ICD-10-CM | POA: Diagnosis not present

## 2020-10-01 DIAGNOSIS — E876 Hypokalemia: Secondary | ICD-10-CM | POA: Insufficient documentation

## 2020-10-01 MED ORDER — PREDNISONE 20 MG PO TABS
40.0000 mg | ORAL_TABLET | Freq: Every day | ORAL | 0 refills | Status: AC
Start: 2020-10-01 — End: 2020-10-06

## 2020-10-01 NOTE — Assessment & Plan Note (Signed)
Noted in the ER. Cont low dose supplement.

## 2020-10-01 NOTE — Progress Notes (Signed)
    I connected with Heidi Mitchell on 10/01/20 at 10:00 AM EST by video and verified that I am speaking with the correct person using two identifiers.   I discussed the limitations, risks, security and privacy concerns of performing an evaluation and management service by video and the availability of in person appointments. I also discussed with the patient that there may be a patient responsible charge related to this service. The patient expressed understanding and agreed to proceed.  Patient location: Home Provider Location: [place] Participants: Heidi Mitchell and Heidi Mitchell   Subjective:     Heidi Mitchell is a 70 y.o. female presenting for ER follow up (Covid +), Cough, and Shortness of Breath     HPI  #Covid positive - continues to have cough and SOB - difficulty talking for a long period of time or she will "lose her breath" - treatment: promethazine-DM at night, tylenol, staying hydrated - ER on 09/24/2020 - possible Left pneumonia - yesterday started feeling better - worse at night then during the day - no wheezing - did get an inhaler at the hospital but it doesn't seem to work - told them it didn't work at the hospital but no medication  Review of Systems   Social History   Tobacco Use  Smoking Status Never Smoker  Smokeless Tobacco Never Used        Objective:   BP Readings from Last 3 Encounters:  09/24/20 117/61  09/20/20 (!) 129/54  09/17/20 110/70   Wt Readings from Last 3 Encounters:  09/17/20 121 lb 8 oz (55.1 kg)  09/10/19 127 lb 4 oz (57.7 kg)  07/31/19 125 lb 8 oz (56.9 kg)    Pulse 78   LMP 11/10/2012 (LMP Unknown)   SpO2 97%    Physical Exam Constitutional:      Appearance: Normal appearance. She is not ill-appearing.  HENT:     Head: Normocephalic and atraumatic.     Right Ear: External ear normal.     Left Ear: External ear normal.  Eyes:     Conjunctiva/sclera: Conjunctivae normal.  Pulmonary:      Effort: Pulmonary effort is normal. No respiratory distress.  Neurological:     Mental Status: She is alert. Mental status is at baseline.  Psychiatric:        Mood and Affect: Mood normal.        Behavior: Behavior normal.        Thought Content: Thought content normal.        Judgment: Judgment normal.            Assessment & Plan:   Problem List Items Addressed This Visit      Other   COVID-19 virus infection - Primary    Reviewed ER visit and urgent care note. CXR on 09/25/2019 with possible left lobe pneumonia. However pt notes improving symptoms and no fever. Discussed trial of prednisone to see if that will help symptoms. ER/call back precautions for worsening symptoms discussed. Ok to end isolation though recommend masking to avoid spread.       Relevant Medications   predniSONE (DELTASONE) 20 MG tablet   Hypokalemia    Noted in the ER. Cont low dose supplement.          Return if symptoms worsen or fail to improve.  Heidi Noe, MD

## 2020-10-01 NOTE — Assessment & Plan Note (Signed)
Reviewed ER visit and urgent care note. CXR on 09/25/2019 with possible left lobe pneumonia. However pt notes improving symptoms and no fever. Discussed trial of prednisone to see if that will help symptoms. ER/call back precautions for worsening symptoms discussed. Ok to end isolation though recommend masking to avoid spread.

## 2020-10-07 ENCOUNTER — Other Ambulatory Visit: Payer: Self-pay | Admitting: *Deleted

## 2020-10-07 MED ORDER — PROMETHAZINE-DM 6.25-15 MG/5ML PO SYRP: 5.0000 mL | ORAL_SOLUTION | Freq: Every evening | ORAL | 0 refills | Status: DC | PRN

## 2020-10-07 NOTE — Telephone Encounter (Signed)
Pt called requesting a refill of promethazine-dm, she said at virtual visit on 10/01/20 PCP asked if she needed med refill and at the time she didn't now she does need med refill  Filled at [facility] on 09/20/20 #100 mL  [place]

## 2021-07-17 ENCOUNTER — Ambulatory Visit (INDEPENDENT_AMBULATORY_CARE_PROVIDER_SITE_OTHER): Payer: Medicare HMO

## 2021-07-17 ENCOUNTER — Other Ambulatory Visit: Payer: Self-pay

## 2021-07-17 DIAGNOSIS — Z23 Encounter for immunization: Secondary | ICD-10-CM | POA: Diagnosis not present

## 2021-07-20 ENCOUNTER — Other Ambulatory Visit: Payer: Self-pay | Admitting: Family Medicine

## 2021-07-20 DIAGNOSIS — E7800 Pure hypercholesterolemia, unspecified: Secondary | ICD-10-CM

## 2021-07-20 NOTE — Telephone Encounter (Addendum)
Called Mrs. Heidi Mitchell and got her scheduled for 10/21/21 @1020 

## 2021-07-20 NOTE — Telephone Encounter (Signed)
Need to schedule AWV and then CPE with Dr Einar Pheasant for 2023 please

## 2021-08-10 ENCOUNTER — Telehealth: Payer: Self-pay | Admitting: Family Medicine

## 2021-08-10 ENCOUNTER — Ambulatory Visit (INDEPENDENT_AMBULATORY_CARE_PROVIDER_SITE_OTHER): Payer: Medicare HMO | Admitting: Internal Medicine

## 2021-08-10 ENCOUNTER — Other Ambulatory Visit: Payer: Self-pay

## 2021-08-10 ENCOUNTER — Encounter: Payer: Self-pay | Admitting: Internal Medicine

## 2021-08-10 VITALS — BP 132/82 | HR 61 | Temp 97.0°F | Ht 60.0 in | Wt 124.1 lb

## 2021-08-10 DIAGNOSIS — R1319 Other dysphagia: Secondary | ICD-10-CM | POA: Insufficient documentation

## 2021-08-10 MED ORDER — OMEPRAZOLE 20 MG PO CPDR: 20.0000 mg | DELAYED_RELEASE_CAPSULE | Freq: Every day | ORAL | 3 refills | Status: DC

## 2021-08-10 NOTE — Assessment & Plan Note (Signed)
Fairly severe---with 1-2 days of persistent pain after bad regurgitation Hasn't been on medication No weight loss Eating okay  Will start omeprazole Set up GI evaluation---will likely need EGD unless responds quickly to PPI

## 2021-08-10 NOTE — Progress Notes (Signed)
Subjective:    Patient ID: Heidi Mitchell, female    DOB: September 18, 1950, 70 y.o.   MRN: [id number]  HPI Here due to swallowing problems This visit occurred during the SARS-CoV-2 public health emergency.  Safety protocols were in place, including screening questions prior to the visit, additional usage of staff PPE, and extensive cleaning of exam room while observing appropriate contact time as indicated for disinfecting solutions.   Food "hangs" in mid chest If she rubs on her neck or on chest--she can get it back up (the food she just ate--no blood) Then she can have some bad pain (may last a couple of days) This goes back for 2-3 months (and is intermittent)  Having a lot of stress now---making things worth Son with bad accident, husband and sister having health issues, etc  Some heartburn--lots of "indigestion" though (burping and substernal discomfort) This is more long standing  Current Outpatient Medications on File Prior to Visit  Medication Sig Dispense Refill   atorvastatin (LIPITOR) 10 MG tablet TAKE 1 TABLET (10 MG TOTAL) BY MOUTH DAILY. (Patient not taking: Reported on 08/10/2021) 90 tablet 0   No current facility-administered medications on file prior to visit.    Allergies  Allergen Reactions   Sertraline Hcl     Muscle spasms    Past Medical History:  Diagnosis Date   Hyperlipidemia    Intracervical pessary    Unspecified disorder of bladder    Uterine prolapse     Past Surgical History:  Procedure Laterality Date   APPENDECTOMY  1990   bartholins cyst  1978   BIOPSY BREAST Right    BREAST BIOPSY Left    benign   COLONOSCOPY WITH PROPOFOL N/A 09/27/2018   Procedure: COLONOSCOPY WITH PROPOFOL;  Surgeon: Jonathon Bellows, MD;  Location: [facility];  Service: Gastroenterology;  Laterality: N/A;    Family History  Problem Relation Age of Onset   Alcohol abuse Mother    Uterine cancer Sister    Other Son        hole in his brain due to been dropped when  he was born    Social History   Socioeconomic History   Marital status: Married    Spouse name: Karle Starch   Number of children: 1   Years of education: high school   Highest education level: Not on file  Occupational History   Not on file  Tobacco Use   Smoking status: Never   Smokeless tobacco: Never  Vaping Use   Vaping Use: Never used  Substance and Sexual Activity   Alcohol use: No   Drug use: No   Sexual activity: Not Currently    Birth control/protection: Abstinence  Other Topics Concern   Not on file  Social History Narrative   Does not have living will   Husband and son aware of wishes.   Pt would desires CPR but would not want prolonged life support if futile.   03/06/19   From: the area   Living: husband, Karle Starch   Work: retired, but finances OK      Family: Son, Christia Reading who lives nearby, 2 grandchildren (40 and 46 yo), and a Product manager on the way      Enjoys: fishing, hiking, camping, outdoors activity      Exercise: walking daily - 1.5 mile route that she does   Diet: not great - too much junk food, tries to do calcium      Safety   Seat belts: Yes  Guns: Yes - hunting gun, locked   Safe in relationships: Yes    Social Determinants of Radio broadcast assistant Strain: Low Risk    Difficulty of Paying Living Expenses: Not hard at all  Food Insecurity: No Food Insecurity   Worried About Charity fundraiser in the Last Year: Never true   Arboriculturist in the Last Year: Never true  Transportation Needs: No Transportation Needs   Lack of Transportation (Medical): No   Lack of Transportation (Non-Medical): No  Physical Activity: Sufficiently Active   Days of Exercise per Week: 7 days   Minutes of Exercise per Session: 30 min  Stress: No Stress Concern Present   Feeling of Stress : Not at all  Social Connections: Not on file  Intimate Partner Violence: Not At Risk   Fear of Current or Ex-Partner: No   Emotionally Abused: No    Physically Abused: No   Sexually Abused: No   Review of Systems Appetite is okay Weight is stable    Objective:   Physical Exam Constitutional:      Appearance: Normal appearance.  Cardiovascular:     Rate and Rhythm: Normal rate and regular rhythm.     Heart sounds: No murmur heard.   No gallop.  Pulmonary:     Effort: Pulmonary effort is normal.     Breath sounds: Normal breath sounds. No wheezing or rales.  Abdominal:     Palpations: Abdomen is soft.     Tenderness: There is no abdominal tenderness.  Musculoskeletal:     Cervical back: Neck supple.     Right lower leg: No edema.     Left lower leg: No edema.  Lymphadenopathy:     Cervical: No cervical adenopathy.  Neurological:     Mental Status: She is alert.           Assessment & Plan:

## 2021-08-11 NOTE — Telephone Encounter (Signed)
error 

## 2021-08-18 ENCOUNTER — Telehealth: Payer: Self-pay | Admitting: Family Medicine

## 2021-08-18 NOTE — Telephone Encounter (Signed)
Pt called checking on status of her Gastroenterology.

## 2021-08-19 NOTE — Telephone Encounter (Signed)
Her Referral was sent to [facility] about a week ago.   They have to review the referral and then will schedule the patient. They are short a Provider so appointments are booking out a little.   Will send to [facility] to see if abe to assist in scheduling patient, first available.   Thanks!

## 2021-08-20 NOTE — Telephone Encounter (Signed)
Thanks, Threasa Beards!

## 2021-08-20 NOTE — Telephone Encounter (Signed)
I called pt to fit her in next week and she told me that she did not need to be seen sooner. I did let her know that we are scheduling the end of Jan into Feb and she said that would be fine.  I scheduled pt Jan 25th with Dr Vicente Males and she is aware.  Pt stated she is doing better since starting Omeprazole

## 2021-08-20 NOTE — Telephone Encounter (Signed)
Noted. Thank you Threasa Beards!

## 2021-08-24 NOTE — Progress Notes (Deleted)
Subjective:   Heidi Mitchell is a 70 y.o. female who presents for Medicare Annual (Subsequent) preventive examination.  I connected with Heidi Mitchell today by telephone and verified that I am speaking with the correct person using two identifiers. Location patient: home Location provider: work Persons participating in the virtual visit: patient, Marine scientist.    I discussed the limitations, risks, security and privacy concerns of performing an evaluation and management service by telephone and the availability of in person appointments. I also discussed with the patient that there may be a patient responsible charge related to this service. The patient expressed understanding and verbally consented to this telephonic visit.    Interactive audio and video telecommunications were attempted between this provider and patient, however failed, due to patient having technical difficulties OR patient did not have access to video capability.  We continued and completed visit with audio only.  Some vital signs may be absent or patient reported.   Time Spent with patient on telephone encounter: *** minutes  Review of Systems           Objective:    There were no vitals filed for this visit. There is no height or weight on file to calculate BMI.  Advanced Directives 08/22/2020 07/17/2019 09/27/2018 06/01/2017 03/19/2016  Does Patient Have a Medical Advance Directive? No No No No No  Would patient like information on creating a medical advance directive? No - Patient declined No - Patient declined No - Patient declined No - Patient declined Yes - Educational materials given    Current Medications (verified) Outpatient Encounter Medications as of 08/26/2021  Medication Sig   atorvastatin (LIPITOR) 10 MG tablet TAKE 1 TABLET (10 MG TOTAL) BY MOUTH DAILY. (Patient not taking: Reported on 08/10/2021)   omeprazole (PRILOSEC) 20 MG capsule Take 1 capsule (20 mg total) by mouth daily. On an empty stomach    No facility-administered encounter medications on file as of 08/26/2021.    Allergies (verified) Sertraline hcl   History: Past Medical History:  Diagnosis Date   Hyperlipidemia    Intracervical pessary    Unspecified disorder of bladder    Uterine prolapse    Past Surgical History:  Procedure Laterality Date   APPENDECTOMY  1990   bartholins cyst  1978   BIOPSY BREAST Right    BREAST BIOPSY Left    benign   COLONOSCOPY WITH PROPOFOL N/A 09/27/2018   Procedure: COLONOSCOPY WITH PROPOFOL;  Surgeon: Jonathon Bellows, MD;  Location: [facility];  Service: Gastroenterology;  Laterality: N/A;   Family History  Problem Relation Age of Onset   Alcohol abuse Mother    Uterine cancer Sister    Other Son        hole in his brain due to been dropped when he was born   Social History   Socioeconomic History   Marital status: Married    Spouse name: Karle Starch   Number of children: 1   Years of education: high school   Highest education level: Not on file  Occupational History   Not on file  Tobacco Use   Smoking status: Never   Smokeless tobacco: Never  Vaping Use   Vaping Use: Never used  Substance and Sexual Activity   Alcohol use: No   Drug use: No   Sexual activity: Not Currently    Birth control/protection: Abstinence  Other Topics Concern   Not on file  Social History Narrative   Does not have living will   Husband and  son aware of wishes.   Pt would desires CPR but would not want prolonged life support if futile.   03/06/19   From: the area   Living: husband, Karle Starch   Work: retired, but finances OK      Family: Son, Christia Reading who lives nearby, 2 grandchildren (34 and 57 yo), and a Product manager on the way      Enjoys: fishing, hiking, camping, outdoors activity      Exercise: walking daily - 1.5 mile route that she does   Diet: not great - too much junk food, tries to do calcium      Safety   Seat belts: Yes    Guns: Yes - hunting gun, locked    Safe in relationships: Yes    Social Determinants of Radio broadcast assistant Strain: Low Risk    Difficulty of Paying Living Expenses: Not hard at all  Food Insecurity: No Food Insecurity   Worried About Charity fundraiser in the Last Year: Never true   Arboriculturist in the Last Year: Never true  Transportation Needs: No Transportation Needs   Lack of Transportation (Medical): No   Lack of Transportation (Non-Medical): No  Physical Activity: Sufficiently Active   Days of Exercise per Week: 7 days   Minutes of Exercise per Session: 30 min  Stress: No Stress Concern Present   Feeling of Stress : Not at all  Social Connections: Not on file    Tobacco Counseling Counseling given: Not Answered   Clinical Intake:                 Diabetic? No         Activities of Daily Living No flowsheet data found.  Patient Care Team: Lesleigh Noe, MD as PCP - General (Family Medicine) [place] as Consulting Physician Rubie Maid, MD as Referring Physician (Obstetrics and Gynecology)  Indicate any recent Medical Services you may have received from other than [facility] providers in the past year (date may be approximate).     Assessment:   This is a routine wellness examination for [name].  Hearing/Vision screen No results found.  Dietary issues and exercise activities discussed:     Goals Addressed   None    Depression Screen PHQ 2/9 Scores 08/22/2020 07/17/2019 07/13/2018 06/01/2017 03/19/2016  PHQ - 2 Score 0 0 0 0 0  PHQ- 9 Score 0 0 - 2 -    Fall Risk Fall Risk  09/17/2020 08/22/2020 07/17/2019 07/13/2018 06/01/2017  Falls in the past year? 0 0 0 No No  Number falls in past yr: 0 0 0 - -  Injury with Fall? - 0 0 - -  Risk for fall due to : - No Fall Risks - - -  Follow up - Falls evaluation completed;Falls prevention discussed Falls evaluation completed;Falls prevention discussed - -    FALL RISK PREVENTION PERTAINING TO THE HOME:  Any  stairs in or around the home? {YES/NO:21197} If so, are there any without handrails? {YES/NO:21197} Home free of loose throw rugs in walkways, pet beds, electrical cords, etc? {YES/NO:21197} Adequate lighting in your home to reduce risk of falls? {YES/NO:21197}  ASSISTIVE DEVICES UTILIZED TO PREVENT FALLS:  Life alert? {YES/NO:21197} Use of a cane, walker or w/c? {YES/NO:21197} Grab bars in the bathroom? {YES/NO:21197} Shower chair or bench in shower? {YES/NO:21197} Elevated toilet seat or a handicapped toilet? {YES/NO:21197}  TIMED UP AND GO:  Was the test performed? No , visit completed  over the phone.    Cognitive Function: MMSE - Mini Mental State Exam 08/22/2020 07/17/2019 06/01/2017 03/19/2016  Orientation to time 5 5 5 5   Orientation to Place 5 5 5 5   Registration 3 3 3 3   Attention/ Calculation 5 5 0 0  Recall 3 3 3 3   Language- name 2 objects - - 0 0  Language- repeat 1 1 1 1   Language- follow 3 step command - - 3 3  Language- read & follow direction - - 0 0  Write a sentence - - 0 0  Copy design - - 0 0  Total score - - 20 20        Immunizations Immunization History  Administered Date(s) Administered   Fluad Quad(high Dose 65+) 07/17/2021   Influenza,inj,Quad PF,6+ Mos 07/31/2019, 09/17/2020   Pneumococcal Conjugate-13 09/17/2020   Td 01/18/2002    TDAP status: Up to date  Flu Vaccine status: Up to date  {Pneumococcal vaccine status:2101807}  {Covid-19 vaccine status:2101808}  Qualifies for Shingles Vaccine? Yes   Zostavax completed No   {Shingrix Completed?:2101804}  Screening Tests Health Maintenance  Topic Date Due   Zoster Vaccines- Shingrix (1 of 2) Never done   Pneumonia Vaccine 54+ Years old (2 - PPSV23 if available, else PCV20) 09/17/2021   COVID-19 Vaccine (1) 09/07/2021 (Originally 02/19/1952)   COLONOSCOPY (Pts 45-33yrs Insurance coverage will need to be confirmed)  09/27/2021   MAMMOGRAM  01/02/2022   TETANUS/TDAP  02/25/2025    INFLUENZA VACCINE  Completed   DEXA SCAN  Completed   Hepatitis C Screening  Completed   HPV VACCINES  Aged Out    Health Maintenance  Health Maintenance Due  Topic Date Due   Zoster Vaccines- Shingrix (1 of 2) Never done   Pneumonia Vaccine 33+ Years old (2 - PPSV23 if available, else PCV20) 09/17/2021    Colorectal cancer screening: Type of screening: Colonoscopy. Completed 09/27/18. Repeat every 3 years  {Mammogram status:21018020}  {Bone Density status:21018021}  Lung Cancer Screening: (Low Dose CT Chest recommended if Age 55-80 years, 30 pack-year currently smoking OR have quit w/in 15years.) does not qualify.     Additional Screening:  Hepatitis C Screening: does qualify; Completed 03/19/16  Vision Screening: Recommended annual ophthalmology exams for early detection of glaucoma and other disorders of the eye. Is the patient up to date with their annual eye exam?  {YES/NO:21197} Who is the provider or what is the name of the office in which the patient attends annual eye exams? *** If pt is not established with a provider, would they like to be referred to a provider to establish care? {YES/NO:21197}.   Dental Screening: Recommended annual dental exams for proper oral hygiene  Community Resource Referral / Chronic Care Management: CRR required this visit?  {YES/NO:21197}  CCM required this visit?  {YES/NO:21197}     Plan:     I have personally reviewed and noted the following in the patient's chart:   Medical and social history Use of alcohol, tobacco or illicit drugs  Current medications and supplements including opioid prescriptions.  Functional ability and status Nutritional status Physical activity Advanced directives List of other physicians Hospitalizations, surgeries, and ER visits in previous 12 months Vitals Screenings to include cognitive, depression, and falls Referrals and appointments  In addition, I have reviewed and discussed with patient  certain preventive protocols, quality metrics, and best practice recommendations. A written personalized care plan for preventive services as well as general preventive health recommendations were provided to  patient.   Due to this being a telephonic visit, the after visit summary with patients personalized plan was offered to patient via mail or my-chart. ***Patient declined at this time./ Patient would like to access on my-chart/ per request, patient was mailed a copy of AVS./ Patient preferred to pick up at office at next visit.   Loma Messing, LPN   [id number]   Nurse Health Advisor  Nurse Notes: none

## 2021-08-26 ENCOUNTER — Ambulatory Visit: Payer: Medicare HMO

## 2021-08-26 NOTE — Progress Notes (Signed)
Subjective:   Heidi Mitchell is a 70 y.o. female who presents for Medicare Annual (Subsequent) preventive examination.  I connected with [name] today by telephone and verified that I am speaking with the correct person using two identifiers. Location patient: home Location provider: work Persons participating in the virtual visit: patient, Marine scientist.    I discussed the limitations, risks, security and privacy concerns of performing an evaluation and management service by telephone and the availability of in person appointments. I also discussed with the patient that there may be a patient responsible charge related to this service. The patient expressed understanding and verbally consented to this telephonic visit.    Interactive audio and video telecommunications were attempted between this provider and patient, however failed, due to patient having technical difficulties OR patient did not have access to video capability.  We continued and completed visit with audio only.  Some vital signs may be absent or patient reported.   Time Spent with patient on telephone encounter: 20 minutes  Review of Systems     Cardiac Risk Factors include: advanced age (>71men, >81 women)     Objective:    Today's Vitals   08/27/21 1359  Weight: 124 lb (56.2 kg)  Height: 5' (1.524 m)   Body mass index is 24.22 kg/m.  Advanced Directives 08/27/2021 08/22/2020 07/17/2019 09/27/2018 06/01/2017 03/19/2016  Does Patient Have a Medical Advance Directive? No No No No No No  Would patient like information on creating a medical advance directive? Yes (MAU/Ambulatory/Procedural Areas - Information given) No - Patient declined No - Patient declined No - Patient declined No - Patient declined Yes - Educational materials given    Current Medications (verified) Outpatient Encounter Medications as of 08/27/2021  Medication Sig   atorvastatin (LIPITOR) 10 MG tablet TAKE 1 TABLET (10 MG TOTAL) BY MOUTH DAILY.    omeprazole (PRILOSEC) 20 MG capsule Take 1 capsule (20 mg total) by mouth daily. On an empty stomach   No facility-administered encounter medications on file as of 08/27/2021.    Allergies (verified) Sertraline hcl   History: Past Medical History:  Diagnosis Date   Hyperlipidemia    Intracervical pessary    Unspecified disorder of bladder    Uterine prolapse    Past Surgical History:  Procedure Laterality Date   APPENDECTOMY  1990   bartholins cyst  1978   BIOPSY BREAST Right    BREAST BIOPSY Left    benign   COLONOSCOPY WITH PROPOFOL N/A 09/27/2018   Procedure: COLONOSCOPY WITH PROPOFOL;  Surgeon: Jonathon Bellows, MD;  Location: [facility] ENDOSCOPY;  Service: Gastroenterology;  Laterality: N/A;   Family History  Problem Relation Age of Onset   Alcohol abuse Mother    Uterine cancer Sister    Other Son        hole in his brain due to been dropped when he was born   Social History   Socioeconomic History   Marital status: Married    Spouse name: Karle Starch   Number of children: 1   Years of education: high school   Highest education level: Not on file  Occupational History   Not on file  Tobacco Use   Smoking status: Never   Smokeless tobacco: Never  Vaping Use   Vaping Use: Never used  Substance and Sexual Activity   Alcohol use: No   Drug use: No   Sexual activity: Not Currently    Birth control/protection: Abstinence  Other Topics Concern   Not on file  Social History Narrative   Does not have living will   Husband and son aware of wishes.   Pt would desires CPR but would not want prolonged life support if futile.   03/06/19   From: the area   Living: husband, Karle Starch   Work: retired, but finances OK      Family: Son, Christia Reading who lives nearby, 2 grandchildren (4 and 46 yo), and a Product manager on the way      Enjoys: fishing, hiking, camping, outdoors activity      Exercise: walking daily - 1.5 mile route that she does   Diet: not great - too much  junk food, tries to do calcium      Safety   Seat belts: Yes    Guns: Yes - hunting gun, locked   Safe in relationships: Yes    Social Determinants of Radio broadcast assistant Strain: Low Risk    Difficulty of Paying Living Expenses: Not hard at all  Food Insecurity: No Food Insecurity   Worried About Charity fundraiser in the Last Year: Never true   Arboriculturist in the Last Year: Never true  Transportation Needs: No Transportation Needs   Lack of Transportation (Medical): No   Lack of Transportation (Non-Medical): No  Physical Activity: Sufficiently Active   Days of Exercise per Week: 7 days   Minutes of Exercise per Session: 40 min  Stress: No Stress Concern Present   Feeling of Stress : Only a little  Social Connections: Moderately Integrated   Frequency of Communication with Friends and Family: More than three times a week   Frequency of Social Gatherings with Friends and Family: More than three times a week   Attends Religious Services: 1 to 4 times per year   Active Member of [employer] or Organizations: No   Attends Music therapist: Never   Marital Status: Married    Tobacco Counseling Counseling given: Not Answered   Clinical Intake:  Pre-visit preparation completed: Yes  Pain : No/denies pain     BMI - recorded: 24.24 Nutritional Status: BMI of 19-24  Normal Nutritional Risks: None Diabetes: No  How often do you need to have someone help you when you read instructions, pamphlets, or other written materials from your doctor or pharmacy?: 1 - Never  Diabetic? no  Interpreter Needed?: No  Information entered by :: Orrin Brigham LPN   Activities of Daily Living In your present state of health, do you have any difficulty performing the following activities: 08/27/2021  Hearing? Y  Comment decrease in hearing  Vision? N  Difficulty concentrating or making decisions? N  Walking or climbing stairs? N  Dressing or bathing? N  Doing  errands, shopping? N  Preparing Food and eating ? N  Using the Toilet? N  In the past six months, have you accidently leaked urine? Y  Comment pessary, has some leakage  Do you have problems with loss of bowel control? N  Managing your Medications? N  Managing your Finances? N  Housekeeping or managing your Housekeeping? N  Some recent data might be hidden    Patient Care Team: Lesleigh Noe, MD as PCP - General (Family Medicine) [place] as Consulting Physician Rubie Maid, MD as Referring Physician (Obstetrics and Gynecology)  Indicate any recent Medical Services you may have received from other than [facility] providers in the past year (date may be approximate).     Assessment:   This is a  routine wellness examination for [facility].  Hearing/Vision screen Hearing Screening - Comments:: Decrease in hearing  Vision Screening - Comments:: Last exam over a year, plan on making an appointment. [facility] in [place]  Dietary issues and exercise activities discussed: Current Exercise Habits: Home exercise routine, Type of exercise: walking, Time (Minutes): 35, Frequency (Times/Week): 7, Weekly Exercise (Minutes/Week): 245, Intensity: Moderate   Goals Addressed             This Visit's Progress    Patient Stated       Would like to increase drinking more water       Depression Screen PHQ 2/9 Scores 08/27/2021 08/22/2020 07/17/2019 07/13/2018 06/01/2017 03/19/2016  PHQ - 2 Score 0 0 0 0 0 0  PHQ- 9 Score - 0 0 - 2 -    Fall Risk Fall Risk  08/27/2021 09/17/2020 08/22/2020 07/17/2019 07/13/2018  Falls in the past year? 0 0 0 0 No  Number falls in past yr: 0 0 0 0 -  Injury with Fall? 0 - 0 0 -  Risk for fall due to : No Fall Risks - No Fall Risks - -  Follow up Falls prevention discussed - Falls evaluation completed;Falls prevention discussed Falls evaluation completed;Falls prevention discussed -    FALL RISK PREVENTION PERTAINING TO THE HOME:  Any stairs  in or around the home? Yes  If so, are there any without handrails? Yes  Home free of loose throw rugs in walkways, pet beds, electrical cords, etc? Yes  Adequate lighting in your home to reduce risk of falls? Yes   ASSISTIVE DEVICES UTILIZED TO PREVENT FALLS:  Life alert? No  Use of a cane, walker or w/c? No  Grab bars in the bathroom? Yes  Shower chair or bench in shower? No  Elevated toilet seat or a handicapped toilet? No   TIMED UP AND GO:  Was the test performed? No , visit completed over the phone.    Cognitive Function: Normal cognitive status assessed by  this Nurse Health Advisor. No abnormalities found.   MMSE - Mini Mental State Exam 08/22/2020 07/17/2019 06/01/2017 03/19/2016  Orientation to time 5 5 5 5   Orientation to Place 5 5 5 5   Registration 3 3 3 3   Attention/ Calculation 5 5 0 0  Recall 3 3 3 3   Language- name 2 objects - - 0 0  Language- repeat 1 1 1 1   Language- follow 3 step command - - 3 3  Language- read & follow direction - - 0 0  Write a sentence - - 0 0  Copy design - - 0 0  Total score - - 20 20        Immunizations Immunization History  Administered Date(s) Administered   Fluad Quad(high Dose 65+) 07/17/2021   Influenza,inj,Quad PF,6+ Mos 07/31/2019, 09/17/2020   Pneumococcal Conjugate-13 09/17/2020   Td 01/18/2002    TDAP status: Up to date  Flu Vaccine status: Up to date  Pneumococcal vaccine status: Up to date  Covid-19 vaccine status: Declined, Education has been provided regarding the importance of this vaccine but patient still declined. Advised may receive this vaccine at local pharmacy or Health Dept.or vaccine clinic. Aware to provide a copy of the vaccination record if obtained from local pharmacy or Health Dept. Verbalized acceptance and understanding.  Qualifies for Shingles Vaccine? Yes   Zostavax completed No   Shingrix Completed?: No.    Education has been provided regarding the importance of this vaccine.  Patient has  been advised to call insurance company to determine out of pocket expense if they have not yet received this vaccine. Advised may also receive vaccine at local pharmacy or Health Dept. Verbalized acceptance and understanding.  Screening Tests Health Maintenance  Topic Date Due   Zoster Vaccines- Shingrix (1 of 2) Never done   Pneumonia Vaccine 23+ Years old (2 - PPSV23 if available, else PCV20) 09/17/2021   COVID-19 Vaccine (1) 09/07/2021 (Originally 02/19/1952)   COLONOSCOPY (Pts 45-70yrs Insurance coverage will need to be confirmed)  09/27/2021   MAMMOGRAM  01/02/2022   TETANUS/TDAP  02/25/2025   INFLUENZA VACCINE  Completed   DEXA SCAN  Completed   Hepatitis C Screening  Completed   HPV VACCINES  Aged Out    Health Maintenance  Health Maintenance Due  Topic Date Due   Zoster Vaccines- Shingrix (1 of 2) Never done   Pneumonia Vaccine 95+ Years old (2 - PPSV23 if available, else PCV20) 09/17/2021    Colorectal cancer screening: Type of screening: Colonoscopy. Completed 09/27/18. Repeat every 3 years  Mammogram status: due, last completed 01/03/20, patient plans to schedule an appointment when ready.  Bone Density status: last completed 08/09/17, patient plans on scheduling an appointment when ready.    Lung Cancer Screening: (Low Dose CT Chest recommended if Age 45-80 years, 30 pack-year currently smoking OR have quit w/in 15years.) does not qualify.     Additional Screening:  Hepatitis C Screening: does qualify; Completed 03/19/16  Vision Screening: Recommended annual ophthalmology exams for early detection of glaucoma and other disorders of the eye. Is the patient up to date with their annual eye exam?  No  Who is the provider or what is the name of the office in which the patient attends annual eye exams? [facility]    Dental Screening: Recommended annual dental exams for proper oral hygiene  Community Resource Referral / Chronic Care Management: CRR required this  visit?  No   CCM required this visit?  No      Plan:     I have personally reviewed and noted the following in the patients chart:   Medical and social history Use of alcohol, tobacco or illicit drugs  Current medications and supplements including opioid prescriptions.  Functional ability and status Nutritional status Physical activity Advanced directives List of other physicians Hospitalizations, surgeries, and ER visits in previous 12 months Vitals Screenings to include cognitive, depression, and falls Referrals and appointments  In addition, I have reviewed and discussed with patient certain preventive protocols, quality metrics, and best practice recommendations. A written personalized care plan for preventive services as well as general preventive health recommendations were provided to patient.   Due to this being a telephonic visit, the after visit summary with patients personalized plan was offered to patient via mail or my-chart. Patient preferred to pick up at office at next visit.   Loma Messing, LPN   [id number]   Nurse Health Advisor  Nurse Notes: none

## 2021-08-27 ENCOUNTER — Ambulatory Visit (INDEPENDENT_AMBULATORY_CARE_PROVIDER_SITE_OTHER): Payer: Medicare HMO

## 2021-08-27 VITALS — Ht 60.0 in | Wt 124.0 lb

## 2021-08-27 DIAGNOSIS — Z0000 Encounter for general adult medical examination without abnormal findings: Secondary | ICD-10-CM | POA: Diagnosis not present

## 2021-08-27 NOTE — Patient Instructions (Signed)
Heidi Mitchell , Thank you for taking time to complete your Medicare Wellness Visit. I appreciate your ongoing commitment to your health goals. Please review the following plan we discussed and let me know if I can assist you in the future.   Screening recommendations/referrals: Colonoscopy: up to date, completed 09/27/18, due 09/27/21 Mammogram: due, last completed 01/03/20, you plan on scheduling when your ready Bone Density: due, last completed 08/09/17, you plan on scheduling when your ready Recommended yearly ophthalmology/optometry visit for glaucoma screening and checkup Recommended yearly dental visit for hygiene and checkup  Vaccinations: Influenza vaccine: up to date Pneumococcal vaccine: Prevnar 13 due, discuss with PCP Tdap vaccine: up to date Shingles vaccine: Discuss with your local pharmacy Covid-19: newest booster available at your local pharmacy  Advanced directives: information available at your next appointment  Conditions/risks identified: see problem list  Next appointment: Follow up in one year for your annual wellness visit    Preventive Care 65 Years and Older, Female Preventive care refers to lifestyle choices and visits with your health care provider that can promote health and wellness. What does preventive care include? A yearly physical exam. This is also called an annual well check. Dental exams once or twice a year. Routine eye exams. Ask your health care provider how often you should have your eyes checked. Personal lifestyle choices, including: Daily care of your teeth and gums. Regular physical activity. Eating a healthy diet. Avoiding tobacco and drug use. Limiting alcohol use. Practicing safe sex. Taking low-dose aspirin every day. Taking vitamin and mineral supplements as recommended by your health care provider. What happens during an annual well check? The services and screenings done by your health care provider during your annual well check  will depend on your age, overall health, lifestyle risk factors, and family history of disease. Counseling  Your health care provider may ask you questions about your: Alcohol use. Tobacco use. Drug use. Emotional well-being. Home and relationship well-being. Sexual activity. Eating habits. History of falls. Memory and ability to understand (cognition). Work and work Statistician. Reproductive health. Screening  You may have the following tests or measurements: Height, weight, and BMI. Blood pressure. Lipid and cholesterol levels. These may be checked every 5 years, or more frequently if you are over 45 years old. Skin check. Lung cancer screening. You may have this screening every year starting at age 67 if you have a 30-pack-year history of smoking and currently smoke or have quit within the past 15 years. Fecal occult blood test (FOBT) of the stool. You may have this test every year starting at age 79. Flexible sigmoidoscopy or colonoscopy. You may have a sigmoidoscopy every 5 years or a colonoscopy every 10 years starting at age 60. Hepatitis C blood test. Hepatitis B blood test. Sexually transmitted disease (STD) testing. Diabetes screening. This is done by checking your blood sugar (glucose) after you have not eaten for a while (fasting). You may have this done every 1-3 years. Bone density scan. This is done to screen for osteoporosis. You may have this done starting at age 56. Mammogram. This may be done every 1-2 years. Talk to your health care provider about how often you should have regular mammograms. Talk with your health care provider about your test results, treatment options, and if necessary, the need for more tests. Vaccines  Your health care provider may recommend certain vaccines, such as: Influenza vaccine. This is recommended every year. Tetanus, diphtheria, and acellular pertussis (Tdap, Td) vaccine. You may need  a Td booster every 10 years. Zoster vaccine. You  may need this after age 88. Pneumococcal 13-valent conjugate (PCV13) vaccine. One dose is recommended after age 64. Pneumococcal polysaccharide (PPSV23) vaccine. One dose is recommended after age 22. Talk to your health care provider about which screenings and vaccines you need and how often you need them. This information is not intended to replace advice given to you by your health care provider. Make sure you discuss any questions you have with your health care provider. Document Released: 09/26/2015 Document Revised: 05/19/2016 Document Reviewed: 07/01/2015 Elsevier Interactive Patient Education  2017 [place] Prevention in the Home Falls can cause injuries. They can happen to people of all ages. There are many things you can do to make your home safe and to help prevent falls. What can I do on the outside of my home? Regularly fix the edges of walkways and driveways and fix any cracks. Remove anything that might make you trip as you walk through a door, such as a raised step or threshold. Trim any bushes or trees on the path to your home. Use bright outdoor lighting. Clear any walking paths of anything that might make someone trip, such as rocks or tools. Regularly check to see if handrails are loose or broken. Make sure that both sides of any steps have handrails. Any raised decks and porches should have guardrails on the edges. Have any leaves, snow, or ice cleared regularly. Use sand or salt on walking paths during winter. Clean up any spills in your garage right away. This includes oil or grease spills. What can I do in the bathroom? Use night lights. Install grab bars by the toilet and in the tub and shower. Do not use towel bars as grab bars. Use non-skid mats or decals in the tub or shower. If you need to sit down in the shower, use a plastic, non-slip stool. Keep the floor dry. Clean up any water that spills on the floor as soon as it happens. Remove soap buildup  in the tub or shower regularly. Attach bath mats securely with double-sided non-slip rug tape. Do not have throw rugs and other things on the floor that can make you trip. What can I do in the bedroom? Use night lights. Make sure that you have a light by your bed that is easy to reach. Do not use any sheets or blankets that are too big for your bed. They should not hang down onto the floor. Have a firm chair that has side arms. You can use this for support while you get dressed. Do not have throw rugs and other things on the floor that can make you trip. What can I do in the kitchen? Clean up any spills right away. Avoid walking on wet floors. Keep items that you use a lot in easy-to-reach places. If you need to reach something above you, use a strong step stool that has a grab bar. Keep electrical cords out of the way. Do not use floor polish or wax that makes floors slippery. If you must use wax, use non-skid floor wax. Do not have throw rugs and other things on the floor that can make you trip. What can I do with my stairs? Do not leave any items on the stairs. Make sure that there are handrails on both sides of the stairs and use them. Fix handrails that are broken or loose. Make sure that handrails are as long as the stairways. Check  any carpeting to make sure that it is firmly attached to the stairs. Fix any carpet that is loose or worn. Avoid having throw rugs at the top or bottom of the stairs. If you do have throw rugs, attach them to the floor with carpet tape. Make sure that you have a light switch at the top of the stairs and the bottom of the stairs. If you do not have them, ask someone to add them for you. What else can I do to help prevent falls? Wear shoes that: Do not have high heels. Have rubber bottoms. Are comfortable and fit you well. Are closed at the toe. Do not wear sandals. If you use a stepladder: Make sure that it is fully opened. Do not climb a closed  stepladder. Make sure that both sides of the stepladder are locked into place. Ask someone to hold it for you, if possible. Clearly mark and make sure that you can see: Any grab bars or handrails. First and last steps. Where the edge of each step is. Use tools that help you move around (mobility aids) if they are needed. These include: Canes. Walkers. Scooters. Crutches. Turn on the lights when you go into a dark area. Replace any light bulbs as soon as they burn out. Set up your furniture so you have a clear path. Avoid moving your furniture around. If any of your floors are uneven, fix them. If there are any pets around you, be aware of where they are. Review your medicines with your doctor. Some medicines can make you feel dizzy. This can increase your chance of falling. Ask your doctor what other things that you can do to help prevent falls. This information is not intended to replace advice given to you by your health care provider. Make sure you discuss any questions you have with your health care provider. Document Released: 06/26/2009 Document Revised: 02/05/2016 Document Reviewed: 10/04/2014 Elsevier Interactive Patient Education  2017 [employer].

## 2021-10-07 ENCOUNTER — Ambulatory Visit: Payer: Medicare HMO | Admitting: Gastroenterology

## 2021-10-21 ENCOUNTER — Encounter: Payer: Medicare HMO | Admitting: Family Medicine

## 2021-11-04 ENCOUNTER — Encounter: Payer: Self-pay | Admitting: Family Medicine

## 2021-11-04 ENCOUNTER — Ambulatory Visit (INDEPENDENT_AMBULATORY_CARE_PROVIDER_SITE_OTHER): Payer: Medicare HMO | Admitting: Family Medicine

## 2021-11-04 ENCOUNTER — Other Ambulatory Visit: Payer: Self-pay

## 2021-11-04 VITALS — BP 128/80 | HR 52 | Temp 97.7°F | Ht 60.25 in | Wt 125.5 lb

## 2021-11-04 DIAGNOSIS — M8580 Other specified disorders of bone density and structure, unspecified site: Secondary | ICD-10-CM | POA: Diagnosis not present

## 2021-11-04 DIAGNOSIS — E7800 Pure hypercholesterolemia, unspecified: Secondary | ICD-10-CM

## 2021-11-04 DIAGNOSIS — Z23 Encounter for immunization: Secondary | ICD-10-CM

## 2021-11-04 DIAGNOSIS — Z0000 Encounter for general adult medical examination without abnormal findings: Secondary | ICD-10-CM

## 2021-11-04 LAB — LIPID PANEL
Cholesterol: 164 mg/dL (ref 0–200)
HDL: 51.4 mg/dL (ref >39.00)
LDL Cholesterol: 97 mg/dL (ref 0–99)
NonHDL: 112.97
Total CHOL/HDL Ratio: 3
Triglycerides: 81 mg/dL (ref 0.0–149.0)
VLDL: 16.2 mg/dL (ref 0.0–40.0)

## 2021-11-04 LAB — COMPREHENSIVE METABOLIC PANEL
ALT: 14 U/L (ref 0–35)
AST: 21 U/L (ref 0–37)
Albumin: 4.3 g/dL (ref 3.5–5.2)
Alkaline Phosphatase: 66 U/L (ref 39–117)
BUN: 14 mg/dL (ref 6–23)
CO2: 31 mEq/L (ref 19–32)
Calcium: 9.3 mg/dL (ref 8.4–10.5)
Chloride: 104 mEq/L (ref 96–112)
Creatinine, Ser: 0.64 mg/dL (ref 0.40–1.20)
GFR: 88.42 mL/min (ref >60.00)
Glucose, Bld: 95 mg/dL (ref 70–99)
Potassium: 4 mEq/L (ref 3.5–5.1)
Sodium: 138 mEq/L (ref 135–145)
Total Bilirubin: 1.4 mg/dL — ABNORMAL HIGH (ref 0.2–1.2)
Total Protein: 6.9 g/dL (ref 6.0–8.3)

## 2021-11-04 MED ORDER — ATORVASTATIN CALCIUM 10 MG PO TABS: 10.0000 mg | ORAL_TABLET | Freq: Every day | ORAL | 1 refills | Status: DC

## 2021-11-04 NOTE — Progress Notes (Signed)
Annual Exam   Chief Complaint:  Chief Complaint  Patient presents with   Medicare Wellness    Part 2    Skin Problem    Light brown "Patches" coming up all over her body    History of Present Illness:  [name] is a 71 y.o. G1P1001 who LMP was Patient's last menstrual period was 11/10/2012 (lmp unknown)., presents today for her annual examination.     Nutrition She does get adequate calcium and Vitamin D in her diet. Diet: needs to be better - would like to lose weight, eating a lot of junk food at night Exercise: caring for son, trying to get back to walking her dog   Dentist: yes Eye: needs to go  Social History   Tobacco Use  Smoking Status Never  Smokeless Tobacco Never     Safety The patient wears seatbelts: yes.     The patient feels safe at home and in their relationships: yes.   Menstrual:  Symptoms of menopause: no  GYN She is not sexually active.    Breast Cancer Screening (Age 56-74):  There is no FH of breast cancer. There is no FH of ovarian cancer. BRCA screening Not Indicated.  Last Mammogram: 12/2019 The patient does want a mammogram this year.    Colon Cancer Screening:  Age 77-75 yo - benefits outweigh the risk. Adults 2-85 yo who have never been screened benefit.  Benefits: 134000 people in 2016 will be diagnosed and 49,000 will die - early detection helps [place]: Complications 2/2 to colonoscopy High Risk (Colonoscopy): genetic disorder (Lynch syndrome or familial adenomatous polyposis), personal hx of IBD, previous adenomatous polyp, or previous colorectal cancer, FamHx start 10 years before the age at diagnosis, increased in males and black race  Options:  FIT - looks for hemoglobin (blood in the stool) - specific and fairly sensitive - must be done annually Cologuard - looks for DNA and blood - more sensitive - therefore can have more false positives, every 3 years Colonoscopy - every 10 years if normal - sedation, bowl prep,  must have someone drive you  Shared decision making and the patient had decided to do colonoscopy due now.   Social History   Tobacco Use  Smoking Status Never  Smokeless Tobacco Never    Lung Cancer Screening (Ages 03-49): not applicable   Weight Wt Readings from Last 3 Encounters:  11/04/21 125 lb 8 oz (56.9 kg)  08/27/21 124 lb (56.2 kg)  08/10/21 124 lb 2 oz (56.3 kg)   Patient has normal BMI  BMI Readings from Last 1 Encounters:  11/04/21 24.31 kg/m     Chronic disease screening Blood pressure monitoring:  BP Readings from Last 3 Encounters:  11/04/21 128/80  08/10/21 132/82  09/24/20 117/61    Lipid Monitoring: Indication for screening: age >18, obesity, diabetes, family hx, CV risk factors.  Lipid screening: Yes  Lab Results  Component Value Date   CHOL 140 09/17/2020   HDL 48.30 09/17/2020   LDLCALC 79 09/17/2020   LDLDIRECT 166.5 03/31/2012   TRIG 67.0 09/17/2020   CHOLHDL 3 09/17/2020     Diabetes Screening: age >14, overweight, family hx, PCOS, hx of gestational diabetes, at risk ethnicity Diabetes Screening screening: Yes  No results found for: HGBA1C   Past Medical History:  Diagnosis Date   Hyperlipidemia    Intracervical pessary    Unspecified disorder of bladder    Uterine prolapse     Past Surgical History:  Procedure Laterality Date   APPENDECTOMY  1990   bartholins cyst  1978   BIOPSY BREAST Right    BREAST BIOPSY Left    benign   COLONOSCOPY WITH PROPOFOL N/A 09/27/2018   Procedure: COLONOSCOPY WITH PROPOFOL;  Surgeon: Jonathon Bellows, MD;  Location[facility] ENDOSCOPY;  Service: Gastroenterology;  Laterality: N/A;    Prior to Admission medications   Medication Sig Start Date End Date Taking? Authorizing Provider  atorvastatin (LIPITOR) 10 MG tablet TAKE 1 TABLET (10 MG TOTAL) BY MOUTH DAILY. 07/20/21  Yes Lesleigh Noe, MD  omeprazole (PRILOSEC) 20 MG capsule Take 1 capsule (20 mg total) by mouth daily. On an empty stomach  08/10/21  Yes Venia Carbon, MD    Allergies  Allergen Reactions   Sertraline Hcl     Muscle spasms    Gynecologic History: Patient's last menstrual period was 11/10/2012 (lmp unknown).  Obstetric History: G1P1001  Social History   Socioeconomic History   Marital status: Married    Spouse name: Heidi Mitchell   Number of children: 1   Years of education: high school   Highest education level: Not on file  Occupational History   Not on file  Tobacco Use   Smoking status: Never   Smokeless tobacco: Never  Vaping Use   Vaping Use: Never used  Substance and Sexual Activity   Alcohol use: No   Drug use: No   Sexual activity: Not Currently    Birth control/protection: Abstinence  Other Topics Concern   Not on file  Social History Narrative   Does not have living will   Husband and son aware of wishes.   Pt would desires CPR but would not want prolonged life support if futile.   03/06/19   From: the area   Living: husband, Heidi Mitchell   Work: retired, but finances OK      Family: Son, Heidi Mitchell who lives nearby, 2 grandchildren (65 and 58 yo), and a Product manager on the way      Enjoys: fishing, hiking, camping, outdoors activity      Exercise: walking daily - 1.5 mile route that she does   Diet: not great - too much junk food, tries to do calcium      Safety   Seat belts: Yes    Guns: Yes - hunting gun, locked   Safe in relationships: Yes    Social Determinants of Radio broadcast assistant Strain: Low Risk    Difficulty of Paying Living Expenses: Not hard at all  Food Insecurity: No Food Insecurity   Worried About Charity fundraiser in the Last Year: Never true   Arboriculturist in the Last Year: Never true  Transportation Needs: No Transportation Needs   Lack of Transportation (Medical): No   Lack of Transportation (Non-Medical): No  Physical Activity: Sufficiently Active   Days of Exercise per Week: 7 days   Minutes of Exercise per Session: 40 min   Stress: No Stress Concern Present   Feeling of Stress : Only a little  Social Connections: Moderately Integrated   Frequency of Communication with Friends and Family: More than three times a week   Frequency of Social Gatherings with Friends and Family: More than three times a week   Attends Religious Services: 1 to 4 times per year   Active Member of [employer] or Organizations: No   Attends Archivist Meetings: Never   Marital Status: Married  Human resources officer Violence: Not At Risk  Fear of Current or Ex-Partner: No   Emotionally Abused: No   Physically Abused: No   Sexually Abused: No    Family History  Problem Relation Age of Onset   Alcohol abuse Mother    Uterine cancer Sister    Other Son        hole in his brain due to been dropped when he was born    Review of Systems  Constitutional:  Negative for chills and fever.  HENT:  Negative for congestion and sore throat.   Eyes:  Negative for blurred vision and double vision.  Respiratory:  Negative for shortness of breath.   Cardiovascular:  Negative for chest pain.  Gastrointestinal:  Negative for heartburn, nausea and vomiting.  Genitourinary: Negative.   Musculoskeletal: Negative.  Negative for myalgias.  Skin:  Negative for rash.  Neurological:  Negative for dizziness and headaches.  Endo/Heme/Allergies:  Does not bruise/bleed easily.  Psychiatric/Behavioral:  Negative for depression. The patient is nervous/anxious.      Physical Exam BP 128/80    Pulse (!) 52    Temp 97.7 F (36.5 C) (Oral)    Ht 5' 0.25" (1.53 m)    Wt 125 lb 8 oz (56.9 kg)    LMP 11/10/2012 (LMP Unknown)    SpO2 97%    BMI 24.31 kg/m    BP Readings from Last 3 Encounters:  11/04/21 128/80  08/10/21 132/82  09/24/20 117/61     Physical Exam Constitutional:      General: She is not in acute distress.    Appearance: She is well-developed. She is not diaphoretic.  HENT:     Head: Normocephalic and atraumatic.     Right Ear:  External ear normal.     Left Ear: External ear normal.     Nose: Nose normal.  Eyes:     General: No scleral icterus.    Extraocular Movements: Extraocular movements intact.     Conjunctiva/sclera: Conjunctivae normal.  Cardiovascular:     Rate and Rhythm: Normal rate and regular rhythm.     Heart sounds: No murmur heard. Pulmonary:     Effort: Pulmonary effort is normal. No respiratory distress.     Breath sounds: Normal breath sounds. No wheezing.  Abdominal:     General: Bowel sounds are normal. There is no distension.     Palpations: Abdomen is soft. There is no mass.     Tenderness: There is no abdominal tenderness. There is no guarding or rebound.  Musculoskeletal:        General: Normal range of motion.     Cervical back: Neck supple.  Lymphadenopathy:     Cervical: No cervical adenopathy.  Skin:    General: Skin is warm and dry.     Capillary Refill: Capillary refill takes less than 2 seconds.  Neurological:     Mental Status: She is alert and oriented to person, place, and time.     Deep Tendon Reflexes: Reflexes normal.  Psychiatric:        Mood and Affect: Mood normal.        Behavior: Behavior normal.     Results:  PHQ-9:  [place] Office Visit from 11/04/2021 in [place] at [place]  PHQ-9 Total Score 3         Assessment: 71 y.o. G39P1001 female here for routine annual physical examination.  Plan: Problem List Items Addressed This Visit       Other   Pure hypercholesterolemia   Relevant  Medications   atorvastatin (LIPITOR) 10 MG tablet   Other Relevant Orders   Lipid panel   Comprehensive metabolic panel   Other Visit Diagnoses     Annual physical exam    -  Primary   Osteopenia, unspecified location       Relevant Orders   DG Bone Density   Need for 23-polyvalent pneumococcal polysaccharide vaccine       Relevant Orders   Pneumococcal polysaccharide vaccine 23-valent greater than or equal to 2yo subcutaneous/IM  (Completed)       Screening: -- Blood pressure screen normal -- cholesterol screening: will obtain -- Weight screening: normal -- Diabetes Screening: will obtain -- Nutrition: Encouraged healthy diet  The 10-year ASCVD risk score (Arnett DK, et al., 2019) is: 16.8%   Values used to calculate the score:     Age: 10 years     Sex: Female     Is Non-Hispanic African American: No     Diabetic: No     Tobacco smoker: Yes     Systolic Blood Pressure: 300 mmHg     Is BP treated: No     HDL Cholesterol: 48.3 mg/dL     Total Cholesterol: 140 mg/dL  -- Statin therapy for Age 87-75 with CVD risk >7.5%  Psych -- Depression screening (PHQ-9):  [place] Visit from 11/04/2021 in [place] at [place]  PHQ-9 Total Score 3        Safety -- tobacco screening: not using -- alcohol screening:  low-risk usage. -- no evidence of domestic violence or intimate partner violence.   Cancer Screening -- pap smear not collected per ASCCP guidelines -- family history of breast cancer screening: done. not at high risk. -- Mammogram -  advised getting -- Colon cancer (age 44+)--  advised getting  Immunizations Immunization History  Administered Date(s) Administered   Fluad Quad(high Dose 65+) 07/17/2021   Influenza,inj,Quad PF,6+ Mos 07/31/2019, 09/17/2020   Pneumococcal Conjugate-13 09/17/2020   Pneumococcal Polysaccharide-23 11/04/2021   Td 01/18/2002    -- flu vaccine up to date -- TDAP q10 years up to date -- Shingles (age >75) not up to date - advised getting -- PPSV-23 (19-64 with chronic disease or smoking) up to date -- PCV-13 (age >14) - one dose followed by PPSV-23 1 year later up to date -- Covid-19 Vaccine not up to date - declined   Encouraged healthy diet and exercise. Encouraged regular vision and dental care.    Lesleigh Noe, MD

## 2021-11-04 NOTE — Patient Instructions (Addendum)
Omeprazole - if the reflux has resolved - decrease to taking every other day - try stopping - if your symptoms return immediately -- then we will want to have GI   Your DEXA showed osteopenia.   This means that she is at risk for developing osteoporosis and have some signs of low bone mass.   Would recommend the following:   1) 800 units of Vitamin D daily 2) Get 1200 mg of elemental calcium --- this is best from your diet. Try to track how much calcium you get on a typical day. You could find ways to add more (dairy products, leafy greens). Take a supplement for whatever you don't typically get so you reach 1200 mg of calcium.  3) Physical activity (ideally weight bearing) - like walking briskly 30 minutes 5 days a week.    You are due for Colonoscopy  [facility] - [place], [place] [address] [phone]   Call and schedule Mammogram and Dexa  Please call the location of your choice from the menu below to schedule your Mammogram and/or Bone Density appointment.    [facility]                      Phone:  [phone]                               [place], [place] [address]                                                             Services: Traditional and 3D Mammogram, Bone Density

## 2021-11-05 ENCOUNTER — Telehealth: Payer: Self-pay | Admitting: Family Medicine

## 2021-11-05 NOTE — Telephone Encounter (Signed)
Spoke to Heidi Mitchell and let her know that Dr. Einar Pheasant would like for her to make an appt. To just talk about her concerns with anxiety since they didn't really talk about it much during her wellness visit. Heidi Mitchell states that she will call back to schedule.

## 2021-11-05 NOTE — Telephone Encounter (Signed)
Heidi Mitchell was in office yesterday and discussed being put on anxiety medication and thought Dr. Einar Pheasant would call some in yesterday. She would like the script to go to [address].

## 2021-12-23 DIAGNOSIS — E7800 Pure hypercholesterolemia, unspecified: Secondary | ICD-10-CM | POA: Diagnosis not present

## 2021-12-23 DIAGNOSIS — H5200 Hypermetropia, unspecified eye: Secondary | ICD-10-CM | POA: Diagnosis not present

## 2021-12-24 DIAGNOSIS — H524 Presbyopia: Secondary | ICD-10-CM | POA: Diagnosis not present

## 2022-01-14 ENCOUNTER — Other Ambulatory Visit: Payer: Self-pay

## 2022-01-14 NOTE — Telephone Encounter (Signed)
Received refill request from [facility] for Omeprazole. During LOV Dr Einar Pheasant noted for patient to try and take this every other day and then to stop if she can. Patient states she has tried to stop taking the medication and after about 3 days or so with certain foods she starts to get that heave sensation in her chest like she did when she saw Dr Silvio Pate and was placed on Omeprazole. Patient is now taking this every other day and symptoms are controlled on that regimen. OK to refill? ?

## 2022-01-18 MED ORDER — OMEPRAZOLE 20 MG PO CPDR: 20.0000 mg | DELAYED_RELEASE_CAPSULE | Freq: Every day | ORAL | 3 refills | Status: DC

## 2022-01-18 NOTE — Telephone Encounter (Signed)
Refill sent to pharmacy.  Given persistent symptoms it may be reasonable to have her meet with a gastroenterologist.  Please call patient and ask her if she would be interested in a referral. ?

## 2022-01-19 NOTE — Telephone Encounter (Signed)
Pt states that she is not interested in the referral at this time due to a lot going on in her family. She will call us in the near future when she is ready for referral.  ?

## 2022-02-23 IMAGING — DX DG CHEST 1V PORT
1 series · 1 of 1 positions shown · non-contrast
Comparison: Portable exam 9619 hours without priors for comparison

CLINICAL DATA: Shortness of breath, cough for 1 week, [id number]-[id number]
positive

EXAM:
PORTABLE CHEST 1 VIEW

[chest ap]
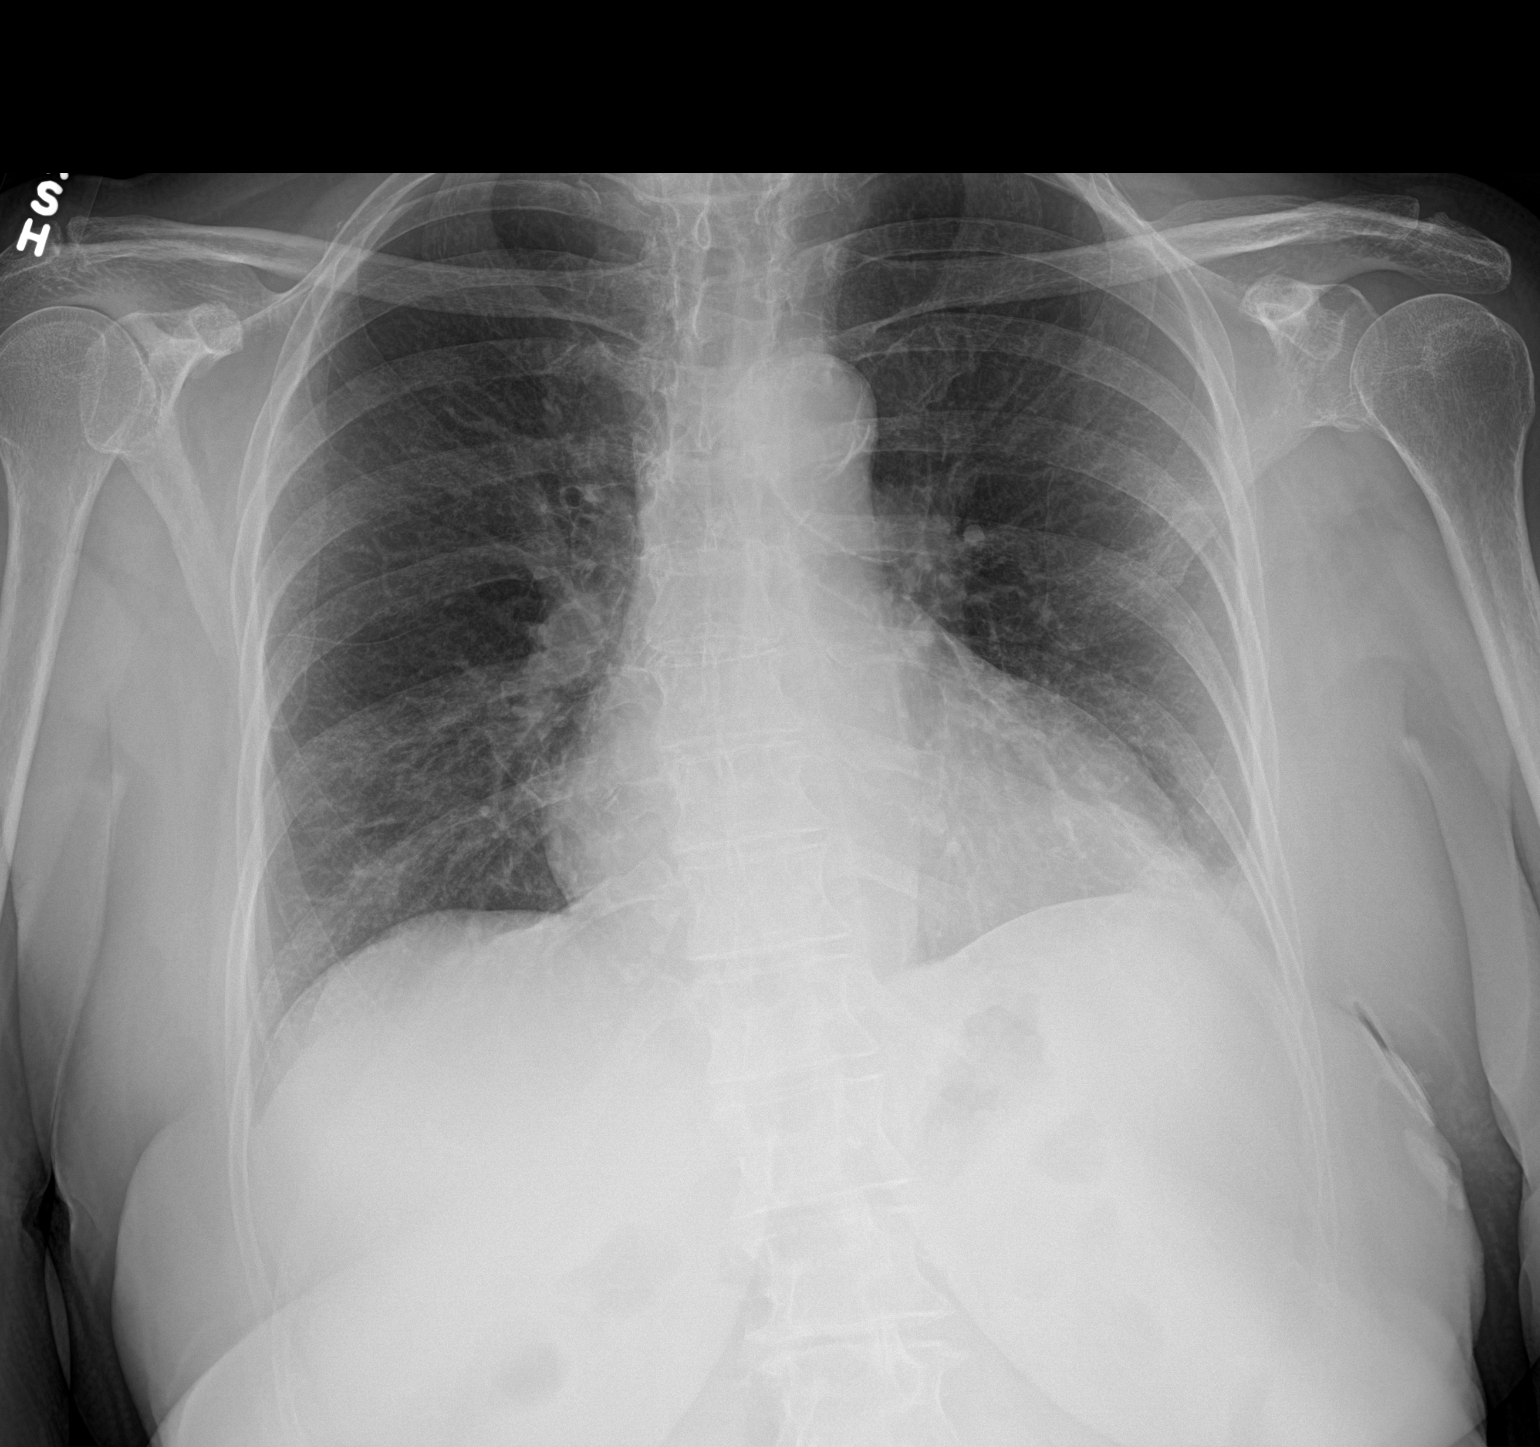

[1 of 1 positions shown; findings below may reference images not displayed]

FINDINGS: Upper normal heart size.

Mediastinal contours and pulmonary vascularity normal.

Atherosclerotic calcification aorta.

Minimal peribronchial thickening with hazy opacity in the mid to
lower LEFT lung suspicious for infiltrate/pneumonia.

Remaining lungs clear.

No pleural effusion or pneumothorax.

Mild dextroconvex thoracic scoliosis.
IMPRESSION: Bronchitic changes with hazy opacities in the mid to lower LEFT lung
suspicious for pneumonia.

## 2022-06-03 ENCOUNTER — Ambulatory Visit (INDEPENDENT_AMBULATORY_CARE_PROVIDER_SITE_OTHER): Payer: Medicare HMO | Admitting: Family Medicine

## 2022-06-03 VITALS — BP 108/60 | HR 55 | Temp 97.0°F | Ht 60.25 in | Wt 120.4 lb

## 2022-06-03 DIAGNOSIS — R197 Diarrhea, unspecified: Secondary | ICD-10-CM | POA: Diagnosis not present

## 2022-06-03 DIAGNOSIS — R1031 Right lower quadrant pain: Secondary | ICD-10-CM | POA: Insufficient documentation

## 2022-06-03 DIAGNOSIS — Z23 Encounter for immunization: Secondary | ICD-10-CM

## 2022-06-03 DIAGNOSIS — E7800 Pure hypercholesterolemia, unspecified: Secondary | ICD-10-CM

## 2022-06-03 DIAGNOSIS — R1319 Other dysphagia: Secondary | ICD-10-CM

## 2022-06-03 LAB — LIPID PANEL
Cholesterol: 263 mg/dL — ABNORMAL HIGH (ref 0–200)
HDL: 50.2 mg/dL (ref >39.00)
LDL Cholesterol: 187 mg/dL — ABNORMAL HIGH (ref 0–99)
NonHDL: 212.79
Total CHOL/HDL Ratio: 5
Triglycerides: 130 mg/dL (ref 0.0–149.0)
VLDL: 26 mg/dL (ref 0.0–40.0)

## 2022-06-03 LAB — COMPREHENSIVE METABOLIC PANEL
ALT: 11 U/L (ref 0–35)
AST: 17 U/L (ref 0–37)
Albumin: 4.2 g/dL (ref 3.5–5.2)
Alkaline Phosphatase: 61 U/L (ref 39–117)
BUN: 15 mg/dL (ref 6–23)
CO2: 26 mEq/L (ref 19–32)
Calcium: 9.6 mg/dL (ref 8.4–10.5)
Chloride: 103 mEq/L (ref 96–112)
Creatinine, Ser: 0.69 mg/dL (ref 0.40–1.20)
GFR: 86.48 mL/min (ref >60.00)
Glucose, Bld: 87 mg/dL (ref 70–99)
Potassium: 4.1 mEq/L (ref 3.5–5.1)
Sodium: 137 mEq/L (ref 135–145)
Total Bilirubin: 1.8 mg/dL — ABNORMAL HIGH (ref 0.2–1.2)
Total Protein: 6.8 g/dL (ref 6.0–8.3)

## 2022-06-03 LAB — CBC
HCT: 40.1 % (ref 36.0–46.0)
Hemoglobin: 13.1 g/dL (ref 12.0–15.0)
MCHC: 32.8 g/dL (ref 30.0–36.0)
MCV: 86.3 fl (ref 78.0–100.0)
Platelets: 213 10*3/uL (ref 150.0–400.0)
RBC: 4.65 Mil/uL (ref 3.87–5.11)
RDW: 15.8 % — ABNORMAL HIGH (ref 11.5–15.5)
WBC: 6.9 10*3/uL (ref 4.0–10.5)

## 2022-06-03 NOTE — Assessment & Plan Note (Addendum)
Restart omeprazole. Monitor for improvement in abdominal pain.

## 2022-06-03 NOTE — Assessment & Plan Note (Signed)
New onset, and chronic. Overdue for colonoscopy and in setting of abdominal pain will get CT Abdomen as well as labs. If negative, anticipate getting stool studies and celiac testing. With pressure if ct negative concern for possible stool burden with seeping around due to long hx of constipation.

## 2022-06-03 NOTE — Progress Notes (Signed)
Subjective:     Heidi Mitchell is a 71 y.o. female presenting for Diarrhea (Right after eating, accompanied with lower abdominal pain. X 2-3 months. Pt stopped taking her omeprazole and atorvastatin x 2 months. )     HPI  #Diarrhea/abdominal pain - will get pain and diarrhea with eating - will get mucus - no blood in the stool - stools have changed - usually constipated - has had loose stools  - has not been eating as result - will have AM pain if she eats at night - heartburn symptoms are the same off the omeprazole - no change in these symptoms - lost weight - has not been eating regularly - following an IBS diet - scrambled egg - low abdominal pressure and pressure on the rectum - has a pessary - thought this could be putting pressure --- removes this every 2 months, w/o any change in the pressure sensation    Review of Systems   Social History   Tobacco Use  Smoking Status Never  Smokeless Tobacco Never        Objective:    BP Readings from Last 3 Encounters:  06/03/22 108/60  11/04/21 128/80  08/10/21 132/82   Wt Readings from Last 3 Encounters:  06/03/22 120 lb 6 oz (54.6 kg)  11/04/21 125 lb 8 oz (56.9 kg)  08/27/21 124 lb (56.2 kg)    BP 108/60   Pulse (!) 55   Temp (!) 97 F (36.1 C) (Temporal)   Ht 5' 0.25" (1.53 m)   Wt 120 lb 6 oz (54.6 kg)   LMP 11/10/2012 (LMP Unknown)   SpO2 99%   BMI 23.31 kg/m    Physical Exam Constitutional:      General: She is not in acute distress.    Appearance: She is well-developed. She is not diaphoretic.  HENT:     Right Ear: External ear normal.     Left Ear: External ear normal.     Nose: Nose normal.  Eyes:     Conjunctiva/sclera: Conjunctivae normal.  Cardiovascular:     Rate and Rhythm: Normal rate and regular rhythm.     Heart sounds: Murmur heard.  Pulmonary:     Effort: Pulmonary effort is normal. No respiratory distress.     Breath sounds: Normal breath sounds. No wheezing.   Abdominal:     General: Abdomen is flat. Bowel sounds are normal.     Palpations: Abdomen is soft.     Tenderness: There is abdominal tenderness in the right lower quadrant and epigastric area. There is guarding. There is no rebound.  Musculoskeletal:     Cervical back: Neck supple.  Skin:    General: Skin is warm and dry.     Capillary Refill: Capillary refill takes less than 2 seconds.  Neurological:     Mental Status: She is alert. Mental status is at baseline.  Psychiatric:        Mood and Affect: Mood normal.        Behavior: Behavior normal.           Assessment & Plan:   Problem List Items Addressed This Visit       Digestive   Esophageal dysphagia    Restart omeprazole. Monitor for improvement in abdominal pain.         Other   Pure hypercholesterolemia    Reassess in setting of stopping atorvastatin.       Relevant Orders   Lipid panel  Right lower quadrant abdominal pain - Primary    ?cancer vs stool burden. CT abdomen. F/u with GI for colonoscopy. Weight loss 5 lbs likely 2/2 to poor PO intake but concerning.       Relevant Orders   CT ABDOMEN PELVIS W CONTRAST   Comprehensive metabolic panel   TSH   CBC   Diarrhea    New onset, and chronic. Overdue for colonoscopy and in setting of abdominal pain will get CT Abdomen as well as labs. If negative, anticipate getting stool studies and celiac testing. With pressure if ct negative concern for possible stool burden with seeping around due to long hx of constipation.       Relevant Orders   CT ABDOMEN PELVIS W CONTRAST   Comprehensive metabolic panel   TSH   CBC   Other Visit Diagnoses     Need for influenza vaccination       Relevant Orders   Flu Vaccine QUAD High Dose(Fluad) (Completed)        Return if symptoms worsen or fail to improve.  Lesleigh Noe, MD

## 2022-06-03 NOTE — Assessment & Plan Note (Addendum)
?  cancer vs stool burden. CT abdomen. F/u with GI for colonoscopy. Weight loss 5 lbs likely 2/2 to poor PO intake but concerning.

## 2022-06-03 NOTE — Assessment & Plan Note (Signed)
Reassess in setting of stopping atorvastatin.

## 2022-06-03 NOTE — Patient Instructions (Addendum)
Schedule your CT Scan [email or web address] [address] [place], [place] [address]  ~14.6 mi [phone]  Schedule Colon Cancer Screening with Dr. Remonia Richter, MD [email or web address] [address], [place], [place] [address]  ~14.5 mi [phone]   Try to restart Omeprazole to see if any improvement

## 2022-06-04 ENCOUNTER — Ambulatory Visit: Payer: Medicare HMO | Admitting: Family Medicine

## 2022-06-04 LAB — TSH: TSH: 2.87 u[IU]/mL (ref 0.35–5.50)

## 2022-06-08 ENCOUNTER — Telehealth: Payer: Self-pay | Admitting: Family Medicine

## 2022-06-08 NOTE — Telephone Encounter (Signed)
Pt called office returning a missed call. I saw the call was regarding her lab work. Talked to be pt abour results. Pt asked was her liver anything to worry about? Requested a print out of lab results to be mailed Call back # is [phone].

## 2022-06-09 ENCOUNTER — Ambulatory Visit
Admission: RE | Admit: 2022-06-09 | Discharge: 2022-06-09 | Disposition: A | Discharge status: Home / Self Care | Payer: Medicare HMO | Source: Ambulatory Visit | Attending: Family Medicine | Admitting: Family Medicine

## 2022-06-09 DIAGNOSIS — R1031 Right lower quadrant pain: Secondary | ICD-10-CM | POA: Diagnosis not present

## 2022-06-09 DIAGNOSIS — K5730 Diverticulosis of large intestine without perforation or abscess without bleeding: Secondary | ICD-10-CM | POA: Diagnosis not present

## 2022-06-09 DIAGNOSIS — R197 Diarrhea, unspecified: Secondary | ICD-10-CM | POA: Diagnosis not present

## 2022-06-09 DIAGNOSIS — N281 Cyst of kidney, acquired: Secondary | ICD-10-CM | POA: Diagnosis not present

## 2022-06-09 DIAGNOSIS — N3289 Other specified disorders of bladder: Secondary | ICD-10-CM | POA: Diagnosis not present

## 2022-06-09 MED ORDER — IOHEXOL 300 MG/ML  SOLN
100.0000 mL | Freq: Once | INTRAMUSCULAR | Status: AC | PRN
  Administered 2022-06-09: 100 mL via INTRAVENOUS

## 2022-06-11 NOTE — Telephone Encounter (Signed)
Spoke to pt and relayed CT scan results of liver. Mailed lab results as requested.

## 2022-06-15 ENCOUNTER — Telehealth: Payer: Self-pay | Admitting: Family Medicine

## 2022-06-15 NOTE — Telephone Encounter (Signed)
She had significant constipation.  She should take daily and adjust as needed based on stools and abdominal pain.  If >3 stools in one day skip 1-2 days and resume if no bowel movements.  Return in 1-2 weeks if symptoms are not improving.

## 2022-06-15 NOTE — Telephone Encounter (Signed)
Patient called in and stated that she was instructed to take a stool softener daily. She was wondering how long should she do that. She also stated that she was suppose to receive her lab results and CT results by mail, but she hasn't received anything. Please advise. Thank you!

## 2022-06-16 NOTE — Telephone Encounter (Signed)
Spoke to pt and relayed Dr. Verda Cumins message. Pt stated understanding.

## 2022-08-30 ENCOUNTER — Ambulatory Visit: Payer: Medicare HMO

## 2022-08-31 ENCOUNTER — Ambulatory Visit (INDEPENDENT_AMBULATORY_CARE_PROVIDER_SITE_OTHER): Payer: Medicare HMO

## 2022-08-31 VITALS — Ht 60.0 in | Wt 116.0 lb

## 2022-08-31 DIAGNOSIS — Z0000 Encounter for general adult medical examination without abnormal findings: Secondary | ICD-10-CM

## 2022-08-31 NOTE — Progress Notes (Signed)
I connected with Heidi Mitchell today by telephone and verified that I am speaking with the correct person using two identifiers. Location patient: home Location provider: work Persons participating in the virtual visit: Jolly, Carlini LPN.   I discussed the limitations, risks, security and privacy concerns of performing an evaluation and management service by telephone and the availability of in person appointments. I also discussed with the patient that there may be a patient responsible charge related to this service. The patient expressed understanding and verbally consented to this telephonic visit.    Interactive audio and video telecommunications were attempted between this provider and patient, however failed, due to patient having technical difficulties OR patient did not have access to video capability.  We continued and completed visit with audio only.     Vital signs may be patient reported or missing.  Subjective:   Heidi Mitchell is a 71 y.o. female who presents for Medicare Annual (Subsequent) preventive examination.  Review of Systems     Cardiac Risk Factors include: advanced age (>81mn, >>3women)     Objective:    Today's Vitals   08/31/22 1127  Weight: 116 lb (52.6 kg)  Height: 5' (1.524 m)   Body mass index is 22.65 kg/m.     08/31/2022   11:31 AM 08/27/2021    2:03 PM 08/22/2020    1:16 PM 07/17/2019    9:21 AM 09/27/2018    1:15 PM 06/01/2017    2:14 PM 03/19/2016    2:50 PM  Advanced Directives  Does Patient Have a Medical Advance Directive? No No No No No No No  Would patient like information on creating a medical advance directive?  Yes (MAU/Ambulatory/Procedural Areas - Information given) No - Patient declined No - Patient declined No - Patient declined No - Patient declined Yes - Educational materials given    Current Medications (verified) Outpatient Encounter Medications as of 08/31/2022  Medication Sig   atorvastatin (LIPITOR)  10 MG tablet Take 1 tablet (10 mg total) by mouth daily.   omeprazole (PRILOSEC) 20 MG capsule Take 1 capsule (20 mg total) by mouth daily. On an empty stomach   sodium fluoride (PREVIDENT 5000 PLUS) 1.1 % CREA dental cream Take by mouth.   No facility-administered encounter medications on file as of 08/31/2022.    Allergies (verified) Sertraline hcl   History: Past Medical History:  Diagnosis Date   Hyperlipidemia    Intracervical pessary    Unspecified disorder of bladder    Uterine prolapse    Past Surgical History:  Procedure Laterality Date   APPENDECTOMY  1990   bartholins cyst  1978   BIOPSY BREAST Right    BREAST BIOPSY Left    benign   COLONOSCOPY WITH PROPOFOL N/A 09/27/2018   Procedure: COLONOSCOPY WITH PROPOFOL;  Surgeon: AJonathon Bellows MD;  [facility]  Service: Gastroenterology;  Laterality: N/A;   Family History  Problem Relation Age of Onset   Alcohol abuse Mother    Uterine cancer Sister    Other Son        hole in his brain due to been dropped when he was born   Social History   Socioeconomic History   Marital status: Married    Spouse name: EKarle Starch  Number of children: 1   Years of education: high school   Highest education level: Not on file  Occupational History   Not on file  Tobacco Use   Smoking status: Never  Smokeless tobacco: Never  Vaping Use   Vaping Use: Never used  Substance and Sexual Activity   Alcohol use: No   Drug use: No   Sexual activity: Not Currently    Birth control/protection: Abstinence  Other Topics Concern   Not on file  Social History Narrative   Does not have living will   Husband and son aware of wishes.   Pt would desires CPR but would not want prolonged life support if futile.   03/06/19   From: the area   Living: husband, Karle Starch   Work: retired, but finances OK      Family: Son, Christia Reading who lives nearby, 2 grandchildren (37 and 13 yo), and a Product manager on the way      Enjoys:  fishing, hiking, camping, outdoors activity      Exercise: walking daily - 1.5 mile route that she does   Diet: not great - too much junk food, tries to do calcium      Safety   Seat belts: Yes    Guns: Yes - hunting gun, locked   Safe in relationships: Yes    Social Determinants of Health   Financial Resource Strain: Low Risk  (08/31/2022)   Overall Financial Resource Strain (CARDIA)    Difficulty of Paying Living Expenses: Not hard at all  Food Insecurity: No Food Insecurity (08/31/2022)   Hunger Vital Sign    Worried About Running Out of Food in the Last Year: Never true    [place] in the Last Year: Never true  Transportation Needs: No Transportation Needs (08/31/2022)   PRAPARE - Hydrologist (Medical): No    Lack of Transportation (Non-Medical): No  Physical Activity: Insufficiently Active (08/31/2022)   Exercise Vital Sign    Days of Exercise per Week: 7 days    Minutes of Exercise per Session: 20 min  Stress: No Stress Concern Present (08/31/2022)   [place]    Feeling of Stress : Not at all  Social Connections: Moderately Integrated (08/27/2021)   Social Connection and Isolation Panel [NHANES]    Frequency of Communication with Friends and Family: More than three times a week    Frequency of Social Gatherings with Friends and Family: More than three times a week    Attends Religious Services: 1 to 4 times per year    Active Member of [employer] or Organizations: No    Attends Music therapist: Never    Marital Status: Married    Tobacco Counseling Counseling given: Not Answered   Clinical Intake:  Pre-visit preparation completed: Yes  Pain : No/denies pain     Nutritional Status: BMI of 19-24  Normal Nutritional Risks: None Diabetes: No  How often do you need to have someone help you when you read instructions, pamphlets, or other written  materials from your doctor or pharmacy?: 1 - Never  Diabetic? no  Interpreter Needed?: No  Information entered by :: NAllen LPN   Activities of Daily Living    08/31/2022   11:32 AM  In your present state of health, do you have any difficulty performing the following activities:  Hearing? 0  Vision? 0  Difficulty concentrating or making decisions? 0  Walking or climbing stairs? 0  Dressing or bathing? 0  Doing errands, shopping? 0  Preparing Food and eating ? N  Using the Toilet? N  In the past six months, have you accidently  leaked urine? Y  Do you have problems with loss of bowel control? N  Managing your Medications? N  Managing your Finances? N  Housekeeping or managing your Housekeeping? N    Patient Care Team: Waunita Schooner, MD as PCP - General (Family Medicine) [place] as Consulting Physician Rubie Maid, MD as Referring Physician (Obstetrics and Gynecology)  Indicate any recent Medical Services you may have received from other than [facility] providers in the past year (date may be approximate).     Assessment:   This is a routine wellness examination for [place].  Hearing/Vision screen Vision Screening - Comments:: Regular eye exams, [facility]  Dietary issues and exercise activities discussed: Current Exercise Habits: Home exercise routine, Type of exercise: walking, Time (Minutes): 20, Frequency (Times/Week): 7, Weekly Exercise (Minutes/Week): 140   Goals Addressed             This Visit's Progress    Patient Stated       08/31/2022, no goals       Depression Screen    08/31/2022   11:32 AM 11/04/2021   11:24 AM 08/27/2021    2:06 PM 08/22/2020    1:17 PM 07/17/2019    9:21 AM 07/13/2018    8:38 AM 06/01/2017    2:14 PM  PHQ 2/9 Scores  PHQ - 2 Score 0 0 0 0 0 0 0  PHQ- 9 Score  3  0 0  2    Fall Risk    08/31/2022   11:31 AM 08/27/2021    2:05 PM 09/17/2020    9:21 AM 08/22/2020    1:16 PM 07/17/2019    9:21 AM  [place] in the past year? 0 0 0 0 0  Number falls in past yr: 0 0 0 0 0  Injury with Fall? 0 0  0 0  Risk for fall due to : Medication side effect No Fall Risks  No Fall Risks   Follow up Falls prevention discussed;Education provided;Falls evaluation completed Falls prevention discussed  Falls evaluation completed;Falls prevention discussed Falls evaluation completed;Falls prevention discussed    FALL RISK PREVENTION PERTAINING TO THE HOME:  Any stairs in or around the home? No  If so, are there any without handrails? N/a Home free of loose throw rugs in walkways, pet beds, electrical cords, etc? Yes  Adequate lighting in your home to reduce risk of falls? Yes   ASSISTIVE DEVICES UTILIZED TO PREVENT FALLS:  Life alert? No  Use of a cane, walker or w/c? No  Grab bars in the bathroom? Yes  Shower chair or bench in shower? No  Elevated toilet seat or a handicapped toilet? No   TIMED UP AND GO:  Was the test performed? No .      Cognitive Function:    08/22/2020    1:19 PM 07/17/2019    9:23 AM 06/01/2017    2:14 PM 03/19/2016    3:00 PM  MMSE - Mini Mental State Exam  Orientation to time '5 5 5 5  '$ Orientation to Place '5 5 5 5  '$ Registration '3 3 3 3  '$ Attention/ Calculation 5 5 0 0  Recall '3 3 3 3  '$ Language- name 2 objects   0 0  Language- repeat '1 1 1 1  '$ Language- follow 3 step command   3 3  Language- read & follow direction   0 0  Write a sentence   0 0  Copy design  0 0  Total score   20 20        08/31/2022   11:33 AM  6CIT Screen  What Year? 0 points  What month? 0 points  What time? 0 points  Count back from 20 0 points  Months in reverse 0 points  Repeat phrase 0 points  Total Score 0 points    Immunizations Immunization History  Administered Date(s) Administered   Fluad Quad(high Dose 65+) 07/17/2021, 06/03/2022   Influenza,inj,Quad PF,6+ Mos 07/31/2019, 09/17/2020   Pneumococcal Conjugate-13 09/17/2020   Pneumococcal Polysaccharide-23  11/04/2021   Td 01/18/2002    TDAP status: Due, Education has been provided regarding the importance of this vaccine. Advised may receive this vaccine at local pharmacy or Health Dept. Aware to provide a copy of the vaccination record if obtained from local pharmacy or Health Dept. Verbalized acceptance and understanding.  Flu Vaccine status: Up to date  Pneumococcal vaccine status: Up to date  Covid-19 vaccine status: Declined, Education has been provided regarding the importance of this vaccine but patient still declined. Advised may receive this vaccine at local pharmacy or Health Dept.or vaccine clinic. Aware to provide a copy of the vaccination record if obtained from local pharmacy or Health Dept. Verbalized acceptance and understanding.  Qualifies for Shingles Vaccine? Yes   Zostavax completed No   Shingrix Completed?: No.    Education has been provided regarding the importance of this vaccine. Patient has been advised to call insurance company to determine out of pocket expense if they have not yet received this vaccine. Advised may also receive vaccine at local pharmacy or Health Dept. Verbalized acceptance and understanding.  Screening Tests Health Maintenance  Topic Date Due   COVID-19 Vaccine (1) Never done   Zoster Vaccines- Shingrix (1 of 2) Never done   DTaP/Tdap/Td (2 - Tdap) 01/19/2012   COLONOSCOPY (Pts 45-47yr Insurance coverage will need to be confirmed)  09/27/2021   MAMMOGRAM  01/02/2022   Medicare Annual Wellness (AWV)  08/27/2022   Pneumonia Vaccine 71 Years old  Completed   INFLUENZA VACCINE  Completed   DEXA SCAN  Completed   Hepatitis C Screening  Completed   HPV VACCINES  Aged Out    Health Maintenance  Health Maintenance Due  Topic Date Due   COVID-19 Vaccine (1) Never done   Zoster Vaccines- Shingrix (1 of 2) Never done   DTaP/Tdap/Td (2 - Tdap) 01/19/2012   COLONOSCOPY (Pts 45-446yrInsurance coverage will need to be confirmed)  09/27/2021    MAMMOGRAM  01/02/2022   Medicare Annual Wellness (AWV)  08/27/2022    Colorectal cancer screening: Type of screening: Colonoscopy. Completed 09/27/2018. Repeat every 3-5 years  Mammogram status: patient to schedule  Bone Density status: Completed 08/09/2017.   Lung Cancer Screening: (Low Dose CT Chest recommended if Age 73-80ears, 30 pack-year currently smoking OR have quit w/in 15years.) does not qualify.   Lung Cancer Screening Referral: no  Additional Screening:  Hepatitis C Screening: does qualify; Completed 03/19/2016  Vision Screening: Recommended annual ophthalmology exams for early detection of glaucoma and other disorders of the eye. Is the patient up to date with their annual eye exam?  Yes  Who is the provider or what is the name of the office in which the patient attends annual eye exams? [facility] If pt is not established with a provider, would they like to be referred to a provider to establish care? No .   Dental Screening: Recommended annual dental exams  for proper oral hygiene  Community Resource Referral / Chronic Care Management: CRR required this visit?  No   CCM required this visit?  No      Plan:     I have personally reviewed and noted the following in the patient's chart:   Medical and social history Use of alcohol, tobacco or illicit drugs  Current medications and supplements including opioid prescriptions. Patient is not currently taking opioid prescriptions. Functional ability and status Nutritional status Physical activity Advanced directives List of other physicians Hospitalizations, surgeries, and ER visits in previous 12 months Vitals Screenings to include cognitive, depression, and falls Referrals and appointments  In addition, I have reviewed and discussed with patient certain preventive protocols, quality metrics, and best practice recommendations. A written personalized care plan for preventive services as well as general  preventive health recommendations were provided to patient.     Kellie Simmering, LPN   [id number]   Nurse Notes: none  Due to this being a virtual visit, the after visit summary with patients personalized plan was offered to patient via mail or my-chart.  to pick up at office at next visit

## 2022-08-31 NOTE — Patient Instructions (Signed)
[name] , Thank you for taking time to come for your Medicare Wellness Visit. I appreciate your ongoing commitment to your health goals. Please review the following plan we discussed and let me know if I can assist you in the future.   These are the goals we discussed:  Goals      Increase water intake     Starting 06/01/2017, I will attempt to drink at least 6-8 glasses of water daily.      Patient Stated     07/17/2019, I will work on losing some weight by eating better and exercising.      Patient Stated     08/22/2020, I will continue to walk my dog daily for about 30 minutes.      Patient Stated     Would like to increase drinking more water     Patient Stated     08/31/2022, no goals        This is a list of the screening recommended for you and due dates:  Health Maintenance  Topic Date Due   COVID-19 Vaccine (1) Never done   Zoster (Shingles) Vaccine (1 of 2) Never done   DTaP/Tdap/Td vaccine (2 - Tdap) 01/19/2012   Colon Cancer Screening  09/27/2021   Mammogram  01/02/2022   Medicare Annual Wellness Visit  09/01/2023   Pneumonia Vaccine  Completed   Flu Shot  Completed   DEXA scan (bone density measurement)  Completed   Hepatitis C Screening: USPSTF Recommendation to screen - Ages 18-79 yo.  Completed   HPV Vaccine  Aged Out    Advanced directives: Advance directive discussed with you today.   Conditions/risks identified: none  Next appointment: Follow up in one year for your annual wellness visit    Preventive Care 65 Years and Older, Female Preventive care refers to lifestyle choices and visits with your health care provider that can promote health and wellness. What does preventive care include? A yearly physical exam. This is also called an annual well check. Dental exams once or twice a year. Routine eye exams. Ask your health care provider how often you should have your eyes checked. Personal lifestyle choices, including: Daily care of your teeth  and gums. Regular physical activity. Eating a healthy diet. Avoiding tobacco and drug use. Limiting alcohol use. Practicing safe sex. Taking low-dose aspirin every day. Taking vitamin and mineral supplements as recommended by your health care provider. What happens during an annual well check? The services and screenings done by your health care provider during your annual well check will depend on your age, overall health, lifestyle risk factors, and family history of disease. Counseling  Your health care provider may ask you questions about your: Alcohol use. Tobacco use. Drug use. Emotional well-being. Home and relationship well-being. Sexual activity. Eating habits. History of falls. Memory and ability to understand (cognition). Work and work Statistician. Reproductive health. Screening  You may have the following tests or measurements: Height, weight, and BMI. Blood pressure. Lipid and cholesterol levels. These may be checked every 5 years, or more frequently if you are over 50 years old. Skin check. Lung cancer screening. You may have this screening every year starting at age 55 if you have a 30-pack-year history of smoking and currently smoke or have quit within the past 15 years. Fecal occult blood test (FOBT) of the stool. You may have this test every year starting at age 53. Flexible sigmoidoscopy or colonoscopy. You may have a sigmoidoscopy every 5  years or a colonoscopy every 10 years starting at age 72. Hepatitis C blood test. Hepatitis B blood test. Sexually transmitted disease (STD) testing. Diabetes screening. This is done by checking your blood sugar (glucose) after you have not eaten for a while (fasting). You may have this done every 1-3 years. Bone density scan. This is done to screen for osteoporosis. You may have this done starting at age 58. Mammogram. This may be done every 1-2 years. Talk to your health care provider about how often you should have regular  mammograms. Talk with your health care provider about your test results, treatment options, and if necessary, the need for more tests. Vaccines  Your health care provider may recommend certain vaccines, such as: Influenza vaccine. This is recommended every year. Tetanus, diphtheria, and acellular pertussis (Tdap, Td) vaccine. You may need a Td booster every 10 years. Zoster vaccine. You may need this after age 85. Pneumococcal 13-valent conjugate (PCV13) vaccine. One dose is recommended after age 58. Pneumococcal polysaccharide (PPSV23) vaccine. One dose is recommended after age 17. Talk to your health care provider about which screenings and vaccines you need and how often you need them. This information is not intended to replace advice given to you by your health care provider. Make sure you discuss any questions you have with your health care provider. Document Released: 09/26/2015 Document Revised: 05/19/2016 Document Reviewed: 07/01/2015 Elsevier Interactive Patient Education  2017 [place] Prevention in the Home Falls can cause injuries. They can happen to people of all ages. There are many things you can do to make your home safe and to help prevent falls. What can I do on the outside of my home? Regularly fix the edges of walkways and driveways and fix any cracks. Remove anything that might make you trip as you walk through a door, such as a raised step or threshold. Trim any bushes or trees on the path to your home. Use bright outdoor lighting. Clear any walking paths of anything that might make someone trip, such as rocks or tools. Regularly check to see if handrails are loose or broken. Make sure that both sides of any steps have handrails. Any raised decks and porches should have guardrails on the edges. Have any leaves, snow, or ice cleared regularly. Use sand or salt on walking paths during winter. Clean up any spills in your garage right away. This includes oil  or grease spills. What can I do in the bathroom? Use night lights. Install grab bars by the toilet and in the tub and shower. Do not use towel bars as grab bars. Use non-skid mats or decals in the tub or shower. If you need to sit down in the shower, use a plastic, non-slip stool. Keep the floor dry. Clean up any water that spills on the floor as soon as it happens. Remove soap buildup in the tub or shower regularly. Attach bath mats securely with double-sided non-slip rug tape. Do not have throw rugs and other things on the floor that can make you trip. What can I do in the bedroom? Use night lights. Make sure that you have a light by your bed that is easy to reach. Do not use any sheets or blankets that are too big for your bed. They should not hang down onto the floor. Have a firm chair that has side arms. You can use this for support while you get dressed. Do not have throw rugs and other things on the  floor that can make you trip. What can I do in the kitchen? Clean up any spills right away. Avoid walking on wet floors. Keep items that you use a lot in easy-to-reach places. If you need to reach something above you, use a strong step stool that has a grab bar. Keep electrical cords out of the way. Do not use floor polish or wax that makes floors slippery. If you must use wax, use non-skid floor wax. Do not have throw rugs and other things on the floor that can make you trip. What can I do with my stairs? Do not leave any items on the stairs. Make sure that there are handrails on both sides of the stairs and use them. Fix handrails that are broken or loose. Make sure that handrails are as long as the stairways. Check any carpeting to make sure that it is firmly attached to the stairs. Fix any carpet that is loose or worn. Avoid having throw rugs at the top or bottom of the stairs. If you do have throw rugs, attach them to the floor with carpet tape. Make sure that you have a light  switch at the top of the stairs and the bottom of the stairs. If you do not have them, ask someone to add them for you. What else can I do to help prevent falls? Wear shoes that: Do not have high heels. Have rubber bottoms. Are comfortable and fit you well. Are closed at the toe. Do not wear sandals. If you use a stepladder: Make sure that it is fully opened. Do not climb a closed stepladder. Make sure that both sides of the stepladder are locked into place. Ask someone to hold it for you, if possible. Clearly mark and make sure that you can see: Any grab bars or handrails. First and last steps. Where the edge of each step is. Use tools that help you move around (mobility aids) if they are needed. These include: Canes. Walkers. Scooters. Crutches. Turn on the lights when you go into a dark area. Replace any light bulbs as soon as they burn out. Set up your furniture so you have a clear path. Avoid moving your furniture around. If any of your floors are uneven, fix them. If there are any pets around you, be aware of where they are. Review your medicines with your doctor. Some medicines can make you feel dizzy. This can increase your chance of falling. Ask your doctor what other things that you can do to help prevent falls. This information is not intended to replace advice given to you by your health care provider. Make sure you discuss any questions you have with your health care provider. Document Released: 06/26/2009 Document Revised: 02/05/2016 Document Reviewed: 10/04/2014 Elsevier Interactive Patient Education  2017 [employer].

## 2022-10-06 ENCOUNTER — Ambulatory Visit: Payer: Medicare HMO | Admitting: Internal Medicine

## 2022-10-06 ENCOUNTER — Ambulatory Visit (INDEPENDENT_AMBULATORY_CARE_PROVIDER_SITE_OTHER): Payer: Medicare HMO | Admitting: Internal Medicine

## 2022-10-06 ENCOUNTER — Encounter: Payer: Self-pay | Admitting: Internal Medicine

## 2022-10-06 VITALS — BP 112/72 | HR 58 | Temp 97.6°F | Ht 60.25 in | Wt 119.0 lb

## 2022-10-06 DIAGNOSIS — H8110 Benign paroxysmal vertigo, unspecified ear: Secondary | ICD-10-CM

## 2022-10-06 DIAGNOSIS — R1319 Other dysphagia: Secondary | ICD-10-CM | POA: Diagnosis not present

## 2022-10-06 DIAGNOSIS — E785 Hyperlipidemia, unspecified: Secondary | ICD-10-CM

## 2022-10-06 DIAGNOSIS — J0110 Acute frontal sinusitis, unspecified: Secondary | ICD-10-CM | POA: Insufficient documentation

## 2022-10-06 MED ORDER — OMEPRAZOLE 20 MG PO CPDR: 20.0000 mg | DELAYED_RELEASE_CAPSULE | ORAL | 0 refills | Status: DC

## 2022-10-06 NOTE — Assessment & Plan Note (Signed)
Not bad She is careful with head movements Keep HOB elevated Doesn't want meclizine Epley maneuver info given

## 2022-10-06 NOTE — Patient Instructions (Signed)
How to Perform the Epley Maneuver The Epley maneuver is an exercise that relieves symptoms of vertigo. Vertigo is the feeling that you or your surroundings are moving when they are not. When you feel vertigo, you may feel like the room is spinning and may have trouble walking. The Epley maneuver is used for a type of vertigo caused by a calcium deposit in a part of the inner ear. The maneuver involves changing head positions to help the deposit move out of the area. You can do this maneuver at home whenever you have symptoms of vertigo. You can repeat it in 24 hours if your vertigo has not gone away. Even though the Epley maneuver may relieve your vertigo for a few weeks, it is possible that your symptoms will return. This maneuver relieves vertigo, but it does not relieve dizziness. What are the risks? If it is done correctly, the Epley maneuver is considered safe. Sometimes it can lead to dizziness or nausea that goes away after a short time. If you develop other symptoms--such as changes in vision, weakness, or numbness--stop doing the maneuver and call your health care provider. Supplies needed: A bed or table. A pillow. How to do the Epley maneuver     Sit on the edge of a bed or table with your back straight and your legs extended or hanging over the edge of the bed or table. Turn your head halfway toward the affected ear or side as told by your health care provider. Lie backward quickly with your head turned until you are lying flat on your back. Your head should dangle (head-hanging position). You may want to position a pillow under your shoulders. Hold this position for at least 30 seconds. If you feel dizzy or have symptoms of vertigo, continue to hold the position until the symptoms stop. Turn your head to the opposite direction until your unaffected ear is facing down. Your head should continue to dangle. Hold this position for at least 30 seconds. If you feel dizzy or have symptoms of  vertigo, continue to hold the position until the symptoms stop. Turn your whole body to the same side as your head so that you are positioned on your side. Your head will now be nearly facedown and no longer needs to dangle. Hold for at least 30 seconds. If you feel dizzy or have symptoms of vertigo, continue to hold the position until the symptoms stop. Sit back up. You can repeat the maneuver in 24 hours if your vertigo does not go away. Follow these instructions at home: For 24 hours after doing the Epley maneuver: Keep your head in an upright position. When lying down to sleep or rest, keep your head raised (elevated) with two or more pillows. Avoid excessive neck movements. Activity Do not drive or use machinery if you feel dizzy. After doing the Epley maneuver, return to your normal activities as told by your health care provider. Ask your health care provider what activities are safe for you. General instructions Drink enough fluid to keep your urine pale yellow. Do not drink alcohol. Take over-the-counter and prescription medicines only as told by your health care provider. Keep all follow-up visits. This is important. Preventing vertigo symptoms Ask your health care provider if there is anything you should do at home to prevent vertigo. He or she may recommend that you: Keep your head elevated with two or more pillows while you sleep. Do not sleep on the side of your affected ear. Get   up slowly from bed. Avoid sudden movements during the day. Avoid extreme head positions or movement, such as looking up or bending over. Contact a health care provider if: Your vertigo gets worse. You have other symptoms, including: Nausea. Vomiting. Headache. Get help right away if you: Have vision changes. Have a headache or neck pain that is severe or getting worse. Cannot stop vomiting. Have new numbness or weakness in any part of your body. These symptoms may represent a serious problem  that is an emergency. Do not wait to see if the symptoms will go away. Get medical help right away. Call your local emergency services (911 in the [place] Do not drive yourself to the hospital. Summary Vertigo is the feeling that you or your surroundings are moving when they are not. The Epley maneuver is an exercise that relieves symptoms of vertigo. If the Epley maneuver is done correctly, it is considered safe. This information is not intended to replace advice given to you by your health care provider. Make sure you discuss any questions you have with your health care provider. Document Revised: 07/30/2020 Document Reviewed: 07/30/2020 Elsevier Patient Education  2023 Elsevier Inc.  

## 2022-10-06 NOTE — Assessment & Plan Note (Signed)
Sounds like viral infection but ongoing after 2 weeks She feels she may be improving If worsens next week, would try empiric antibiotic (augmentin or doxy)

## 2022-10-06 NOTE — Assessment & Plan Note (Signed)
Back on atorvastatin 10 daily

## 2022-10-06 NOTE — Assessment & Plan Note (Signed)
Mostly better Suggested she try the omeprazole '20mg'$  every other day or so (since still some symptoms)

## 2022-10-06 NOTE — Progress Notes (Signed)
Subjective:    Patient ID: Heidi Mitchell, female    DOB: 06-24-1951, 72 y.o.   MRN: [id number]  HPI Here for transfer of care  Has had a cold for 2 weeks Ongoing cough-this is the main concern (used left over cough med) Has had some right frontal headache No fever Some post nasal drip now Chronic right ear problems--goes back a while Overall is improving  Notices positional vertigo---mostly getting in and out of bed Also with rolling over Goes back a couple of months Tends to happen in winter---not new Does okay with behavioral changes--moves slowly No sig tinnitus Hearing is okay No motion sickness  Had tried off the atorvastatin Had associated this with her dysphagia Was high off it---now back on it  Dysphagia is better with omeprazole Only using prn  Can be sig once a month or so----worse if she is upset  Current Outpatient Medications on File Prior to Visit  Medication Sig Dispense Refill   atorvastatin (LIPITOR) 10 MG tablet Take 1 tablet (10 mg total) by mouth daily. 90 tablet 1   omeprazole (PRILOSEC) 20 MG capsule Take 1 capsule (20 mg total) by mouth daily. On an empty stomach 90 capsule 3   sodium fluoride (PREVIDENT 5000 PLUS) 1.1 % CREA dental cream Take by mouth.     No current facility-administered medications on file prior to visit.    Allergies  Allergen Reactions   Sertraline Hcl     Muscle spasms    Past Medical History:  Diagnosis Date   Hyperlipidemia    Intracervical pessary    Unspecified disorder of bladder    Uterine prolapse     Past Surgical History:  Procedure Laterality Date   APPENDECTOMY  1990   bartholins cyst  1978   BIOPSY BREAST Right    BREAST BIOPSY Left    benign   COLONOSCOPY WITH PROPOFOL N/A 09/27/2018   Procedure: COLONOSCOPY WITH PROPOFOL;  Surgeon: Jonathon Bellows, MD;  Location: [facility] ENDOSCOPY;  Service: Gastroenterology;  Laterality: N/A;    Family History  Problem Relation Age of Onset   Alcohol abuse  Mother    Uterine cancer Sister    Other Son        hole in his brain due to been dropped when he was born    Social History   Socioeconomic History   Marital status: Married    Spouse name: Karle Starch   Number of children: 1   Years of education: high school   Highest education level: Not on file  Occupational History   Not on file  Tobacco Use   Smoking status: Never    Passive exposure: Current   Smokeless tobacco: Never  Vaping Use   Vaping Use: Never used  Substance and Sexual Activity   Alcohol use: No   Drug use: No   Sexual activity: Not Currently    Birth control/protection: Abstinence  Other Topics Concern   Not on file  Social History Narrative   Does not have living will   Husband and son aware of wishes.   Pt would desires CPR but would not want prolonged life support if futile.   03/06/19   From: the area   Living: husband, Karle Starch   Work: retired, but finances OK      Family: Son, Christia Reading who lives nearby, 2 grandchildren (63 and 1 yo), and a Product manager on the way      Enjoys: fishing, hiking, camping, outdoors activity  Exercise: walking daily - 1.5 mile route that she does   Diet: not great - too much junk food, tries to do calcium      Safety   Seat belts: Yes    Guns: Yes - hunting gun, locked   Safe in relationships: Yes    Social Determinants of Health   Financial Resource Strain: Low Risk  (08/31/2022)   Overall Financial Resource Strain (CARDIA)    Difficulty of Paying Living Expenses: Not hard at all  Food Insecurity: No Food Insecurity (08/31/2022)   Hunger Vital Sign    Worried About Running Out of Food in the Last Year: Never true    Ran Out of Food in the Last Year: Never true  Transportation Needs: No Transportation Needs (08/31/2022)   PRAPARE - Hydrologist (Medical): No    Lack of Transportation (Non-Medical): No  Physical Activity: Insufficiently Active (08/31/2022)   Exercise Vital  Sign    Days of Exercise per Week: 7 days    Minutes of Exercise per Session: 20 min  Stress: No Stress Concern Present (08/31/2022)   [place]    Feeling of Stress : Not at all  Social Connections: Moderately Integrated (08/27/2021)   Social Connection and Isolation Panel [NHANES]    Frequency of Communication with Friends and Family: More than three times a week    Frequency of Social Gatherings with Friends and Family: More than three times a week    Attends Religious Services: 1 to 4 times per year    Active Member of [employer] or Organizations: No    Attends Archivist Meetings: Never    Marital Status: Married  Human resources officer Violence: Not At Risk (08/27/2021)   Humiliation, Afraid, Rape, and Kick questionnaire    Fear of Current or Ex-Partner: No    Emotionally Abused: No    Physically Abused: No    Sexually Abused: No   Review of Systems Doesn't really sleep well--lots on her mind (family stress) Not depressed Sister is ill--sees her every night Caring for great grandbaby Husband having issues---hearing loss, etc Appetite is okay Weight is stable    Objective:   Physical Exam Constitutional:      Appearance: Normal appearance.  HENT:     Head:     Comments: No sinus tenderness    Right Ear: Tympanic membrane and ear canal normal.     Left Ear: Tympanic membrane and ear canal normal.     Nose: Congestion present.     Mouth/Throat:     Pharynx: No oropharyngeal exudate or posterior oropharyngeal erythema.  Eyes:     Extraocular Movements: Extraocular movements intact.     Comments: No nystagmus  Pulmonary:     Effort: Pulmonary effort is normal.     Breath sounds: Normal breath sounds. No wheezing or rales.  Neurological:     Mental Status: She is alert.            Assessment & Plan:

## 2022-11-01 ENCOUNTER — Telehealth: Payer: Self-pay | Admitting: Family Medicine

## 2022-11-01 MED ORDER — BENZONATATE 200 MG PO CAPS: 200.0000 mg | ORAL_CAPSULE | Freq: Three times a day (TID) | ORAL | 0 refills | Status: DC | PRN

## 2022-11-01 MED ORDER — AMOXICILLIN-POT CLAVULANATE 875-125 MG PO TABS: 1.0000 | ORAL_TABLET | Freq: Two times a day (BID) | ORAL | 0 refills | Status: DC

## 2022-11-01 NOTE — Telephone Encounter (Signed)
Called and spoke with patient she states she is still struggling with a deep dry cough, chest and ribs are aching from coughing so much and is slightly SOB, not much. Her eyes are matted when she wakes up in the morning, and throughout the day they are itching and watering. Please advise

## 2022-11-01 NOTE — Telephone Encounter (Signed)
Pt called in requesting something be called in for her cough . Stated PCP advise her if cough persist . Please advise [phone]

## 2022-11-01 NOTE — Telephone Encounter (Signed)
Please let her know that I sent in an antibiotic and a medication for cough--to her [facility]

## 2022-11-01 NOTE — Telephone Encounter (Signed)
Called patient and reviewed all information. Patient verbalized understanding. Will call if any further questions.

## 2022-11-02 ENCOUNTER — Other Ambulatory Visit: Payer: Medicare HMO

## 2022-11-09 ENCOUNTER — Encounter: Payer: Medicare HMO | Admitting: Internal Medicine

## 2022-12-02 ENCOUNTER — Other Ambulatory Visit: Payer: Self-pay | Admitting: Internal Medicine

## 2022-12-02 DIAGNOSIS — Z1231 Encounter for screening mammogram for malignant neoplasm of breast: Secondary | ICD-10-CM

## 2023-01-04 ENCOUNTER — Telehealth: Payer: Self-pay | Admitting: Internal Medicine

## 2023-01-04 MED ORDER — OMEPRAZOLE 20 MG PO CPDR: 20.0000 mg | DELAYED_RELEASE_CAPSULE | ORAL | 1 refills | Status: DC

## 2023-01-04 NOTE — Telephone Encounter (Signed)
I have not received a request from the pharmacy for it. I sent it in.

## 2023-01-04 NOTE — Telephone Encounter (Signed)
Prescription Request  01/04/2023  LOV: 10/06/2022  What is the name of the medication or equipment? omeprazole (PRILOSEC) 20 MG capsule   Have you contacted your pharmacy to request a refill? Yes, pt states pharmacy stated they've sent over multiple refill request.   Which pharmacy would you like this sent to?   [facility] - [place], [place] - [address] [place] [address] Phone: [phone] Fax: [phone]    Patient notified that their request is being sent to the clinical staff for review and that they should receive a response within 2 business days.   Please advise at Mobile [phone] (mobile)\

## 2023-01-06 ENCOUNTER — Telehealth: Payer: Self-pay | Admitting: Internal Medicine

## 2023-01-06 NOTE — Telephone Encounter (Signed)
Pt called stating she currently has a lump in her breast & wants to be seen by the [facility] in [place] but needs a referral. Pt is asking Alphonsus Sias can he send in referral? Call back # [phone]

## 2023-01-06 NOTE — Telephone Encounter (Signed)
Spoke to pt. Advised her that she would need an office visit for proper documentation of the breast lump so a referral can be done. She wanted an afternoon appt. Appt made for 01-13-23. Advised her if she wanted to be seen earlier than 1 week and can come earlier in the day, we have multiple openings available.

## 2023-01-13 ENCOUNTER — Ambulatory Visit: Payer: Medicare HMO | Admitting: Internal Medicine

## 2023-01-31 ENCOUNTER — Encounter: Payer: Self-pay | Admitting: Internal Medicine

## 2023-01-31 ENCOUNTER — Ambulatory Visit (INDEPENDENT_AMBULATORY_CARE_PROVIDER_SITE_OTHER): Payer: Medicare HMO | Admitting: Internal Medicine

## 2023-01-31 VITALS — BP 124/78 | HR 54 | Temp 97.3°F | Ht 60.25 in | Wt 114.0 lb

## 2023-01-31 DIAGNOSIS — R300 Dysuria: Secondary | ICD-10-CM | POA: Diagnosis not present

## 2023-01-31 DIAGNOSIS — N6310 Unspecified lump in the right breast, unspecified quadrant: Secondary | ICD-10-CM | POA: Diagnosis not present

## 2023-01-31 DIAGNOSIS — N3000 Acute cystitis without hematuria: Secondary | ICD-10-CM | POA: Diagnosis not present

## 2023-01-31 DIAGNOSIS — N390 Urinary tract infection, site not specified: Secondary | ICD-10-CM | POA: Insufficient documentation

## 2023-01-31 DIAGNOSIS — N3001 Acute cystitis with hematuria: Secondary | ICD-10-CM | POA: Insufficient documentation

## 2023-01-31 LAB — POC URINALSYSI DIPSTICK (AUTOMATED)
Bilirubin, UA: NEGATIVE
Glucose, UA: NEGATIVE
Ketones, UA: NEGATIVE
Nitrite, UA: POSITIVE
Protein, UA: POSITIVE — AB
Spec Grav, UA: 1.025 (ref 1.010–1.025)
Urobilinogen, UA: 0.2 E.U./dL
pH, UA: 6 (ref 5.0–8.0)

## 2023-01-31 MED ORDER — SULFAMETHOXAZOLE-TRIMETHOPRIM 800-160 MG PO TABS: 1.0000 | ORAL_TABLET | Freq: Two times a day (BID) | ORAL | 1 refills | Status: DC

## 2023-01-31 NOTE — Progress Notes (Signed)
Subjective:    Patient ID: Heidi Mitchell, female    DOB: 08-14-1951, 72 y.o.   MRN: [id number]  HPI Here due to a breast lump and urinary symptoms  Has had urinary frequency Known prolapsed bladder---and picking up year old granddaughter (who she is now watching all day) Some pressure a while back---uses azo briefly and that seems better No incontinence No hematuria  Noticed a lump in top of right breast Will change in size at times Painful at times Goes back a couple of months Sore all the time--but more pain when it gets bigger  Current Outpatient Medications on File Prior to Visit  Medication Sig Dispense Refill   atorvastatin (LIPITOR) 10 MG tablet Take 1 tablet (10 mg total) by mouth daily. 90 tablet 1   omeprazole (PRILOSEC) 20 MG capsule Take 1 capsule (20 mg total) by mouth every other day. On an empty stomach 45 capsule 1   sodium fluoride (PREVIDENT 5000 PLUS) 1.1 % CREA dental cream Take by mouth.     No current facility-administered medications on file prior to visit.    Allergies  Allergen Reactions   Sertraline Hcl     Muscle spasms    Past Medical History:  Diagnosis Date   Hyperlipidemia    Intracervical pessary    Unspecified disorder of bladder    Uterine prolapse     Past Surgical History:  Procedure Laterality Date   APPENDECTOMY  1990   bartholins cyst  1978   BIOPSY BREAST Right    BREAST BIOPSY Left    benign   COLONOSCOPY WITH PROPOFOL N/A 09/27/2018   Procedure: COLONOSCOPY WITH PROPOFOL;  Surgeon: Wyline Mood, MD;  Location: [facility] ENDOSCOPY;  Service: Gastroenterology;  Laterality: N/A;    Family History  Problem Relation Age of Onset   Alcohol abuse Mother    Uterine cancer Sister    Other Son        hole in his brain due to been dropped when he was born    Social History   Socioeconomic History   Marital status: Married    Spouse name: Mitzie Na   Number of children: 1   Years of education: high school   Highest education  level: Not on file  Occupational History   Not on file  Tobacco Use   Smoking status: Never    Passive exposure: Current   Smokeless tobacco: Never  Vaping Use   Vaping Use: Never used  Substance and Sexual Activity   Alcohol use: No   Drug use: No   Sexual activity: Not Currently    Birth control/protection: Abstinence  Other Topics Concern   Not on file  Social History Narrative   Does not have living will   Husband and son aware of wishes.   Pt would desires CPR but would not want prolonged life support if futile.   03/06/19   From: the area   Living: husband, Mitzie Na   Work: retired, but finances OK      Family: Son, Marcial Pacas who lives nearby, 2 grandchildren (24 and 83 yo), and a Physicist, medical on the way      Enjoys: fishing, hiking, camping, outdoors activity      Exercise: walking daily - 1.5 mile route that she does   Diet: not great - too much junk food, tries to do calcium      Safety   Seat belts: Yes    Guns: Yes - hunting gun, locked   Safe in  relationships: Yes    Social Determinants of Health   Financial Resource Strain: Low Risk  (08/31/2022)   Overall Financial Resource Strain (CARDIA)    Difficulty of Paying Living Expenses: Not hard at all  Food Insecurity: No Food Insecurity (08/31/2022)   Hunger Vital Sign    Worried About Running Out of Food in the Last Year: Never true    Ran Out of Food in the Last Year: Never true  Transportation Needs: No Transportation Needs (08/31/2022)   PRAPARE - Administrator, Civil Service (Medical): No    Lack of Transportation (Non-Medical): No  Physical Activity: Insufficiently Active (08/31/2022)   Exercise Vital Sign    Days of Exercise per Week: 7 days    Minutes of Exercise per Session: 20 min  Stress: No Stress Concern Present (08/31/2022)   [employer] of Occupational Health - Occupational Stress Questionnaire    Feeling of Stress : Not at all  Social Connections: Moderately  Integrated (08/27/2021)   Social Connection and Isolation Panel [NHANES]    Frequency of Communication with Friends and Family: More than three times a week    Frequency of Social Gatherings with Friends and Family: More than three times a week    Attends Religious Services: 1 to 4 times per year    Active Member of [employer] or Organizations: No    Attends Banker Meetings: Never    Marital Status: Married  Catering manager Violence: Not At Risk (08/27/2021)   Humiliation, Afraid, Rape, and Kick questionnaire    Fear of Current or Ex-Partner: No    Emotionally Abused: No    Physically Abused: No    Sexually Abused: No   Review of Systems No fever No N/V No back pain    Objective:   Physical Exam Constitutional:      Appearance: Normal appearance.  Abdominal:     Palpations: Abdomen is soft.     Tenderness: There is no abdominal tenderness. There is no right CVA tenderness or left CVA tenderness.  Genitourinary:    Comments: Mild cystic changes in left breast---much more striking with predominant mostly cystic feeling mass at 12 o'clock on right---which is tender Lymphadenopathy:     Upper Body:     Right upper body: No axillary adenopathy.     Left upper body: No axillary adenopathy.  Neurological:     Mental Status: She is alert.            Assessment & Plan:

## 2023-01-31 NOTE — Addendum Note (Signed)
Addended by: Eual Fines on: 01/31/2023 03:18 PM   Modules accepted: Orders

## 2023-01-31 NOTE — Assessment & Plan Note (Signed)
Feels cystic but possible solid feeling portion Some tenderness  Will set up diagnostic mammogram with ultrasound

## 2023-01-31 NOTE — Assessment & Plan Note (Signed)
Mostly just frequency---but has 3+ leuiks and dip blood on urinalysis Will send culture Empiric Rx with septra DS bid x 3 days

## 2023-02-01 ENCOUNTER — Encounter: Payer: Self-pay | Admitting: Internal Medicine

## 2023-02-02 ENCOUNTER — Ambulatory Visit: Payer: Medicare HMO | Admitting: Internal Medicine

## 2023-02-04 LAB — URINE CULTURE
MICRO NUMBER:: 14978531
SPECIMEN QUALITY:: ADEQUATE

## 2023-02-14 ENCOUNTER — Telehealth: Payer: Self-pay | Admitting: Internal Medicine

## 2023-02-14 NOTE — Telephone Encounter (Signed)
Called and advised pt I canceled it the day she was here.

## 2023-02-14 NOTE — Telephone Encounter (Signed)
Pt called asking if Carollee Herter cancelled her appointment with the mammogram mobile unit on 03/01/23? Call back # [phone]

## 2023-02-21 ENCOUNTER — Encounter: Payer: Self-pay | Admitting: Family Medicine

## 2023-02-28 ENCOUNTER — Ambulatory Visit
Admission: RE | Admit: 2023-02-28 | Discharge: 2023-02-28 | Disposition: A | Discharge status: Home / Self Care | Payer: Medicare HMO | Source: Ambulatory Visit | Attending: Internal Medicine | Admitting: Internal Medicine

## 2023-02-28 ENCOUNTER — Other Ambulatory Visit: Payer: Self-pay | Admitting: Internal Medicine

## 2023-02-28 DIAGNOSIS — R599 Enlarged lymph nodes, unspecified: Secondary | ICD-10-CM

## 2023-02-28 DIAGNOSIS — N6310 Unspecified lump in the right breast, unspecified quadrant: Secondary | ICD-10-CM

## 2023-02-28 DIAGNOSIS — R590 Localized enlarged lymph nodes: Secondary | ICD-10-CM | POA: Diagnosis not present

## 2023-02-28 DIAGNOSIS — N6315 Unspecified lump in the right breast, overlapping quadrants: Secondary | ICD-10-CM | POA: Diagnosis not present

## 2023-03-04 ENCOUNTER — Ambulatory Visit
Admission: RE | Admit: 2023-03-04 | Discharge: 2023-03-04 | Disposition: A | Discharge status: Home / Self Care | Payer: Medicare HMO | Source: Ambulatory Visit | Attending: Internal Medicine | Admitting: Internal Medicine

## 2023-03-04 DIAGNOSIS — R590 Localized enlarged lymph nodes: Secondary | ICD-10-CM | POA: Diagnosis not present

## 2023-03-04 DIAGNOSIS — N6310 Unspecified lump in the right breast, unspecified quadrant: Secondary | ICD-10-CM

## 2023-03-04 DIAGNOSIS — C50811 Malignant neoplasm of overlapping sites of right female breast: Secondary | ICD-10-CM | POA: Diagnosis not present

## 2023-03-04 DIAGNOSIS — R599 Enlarged lymph nodes, unspecified: Secondary | ICD-10-CM

## 2023-03-04 DIAGNOSIS — N6315 Unspecified lump in the right breast, overlapping quadrants: Secondary | ICD-10-CM | POA: Diagnosis not present

## 2023-03-04 DIAGNOSIS — C773 Secondary and unspecified malignant neoplasm of axilla and upper limb lymph nodes: Secondary | ICD-10-CM | POA: Diagnosis not present

## 2023-03-04 HISTORY — PX: BREAST BIOPSY: SHX20

## 2023-03-07 ENCOUNTER — Telehealth: Payer: Self-pay | Admitting: Hematology and Oncology

## 2023-03-07 NOTE — Telephone Encounter (Signed)
Spoke to patient to confirm upcoming afternoon [facility] clinic appointment on 7/3, paperwork will be sent via mail.   Gave location and time, also informed patient that the surgeon's office would be calling as well to get information from them similar to the packet that they will be receiving so make sure to do both.  Reminded patient that all providers will be coming to the clinic to see them HERE and if they had any questions to not hesitate to reach back out to myself or their navigators.

## 2023-03-11 ENCOUNTER — Ambulatory Visit: Payer: Medicare HMO

## 2023-03-14 ENCOUNTER — Encounter: Payer: Self-pay | Admitting: *Deleted

## 2023-03-14 DIAGNOSIS — C50411 Malignant neoplasm of upper-outer quadrant of right female breast: Secondary | ICD-10-CM

## 2023-03-15 NOTE — Progress Notes (Signed)
Radiation Oncology         [phone] ________________________________  Initial Outpatient Consultation  Name: Heidi Mitchell MRN: [id number]  Date: 03/16/2023  DOB: March 18, 1951  WG:[id number], Berneda Rose, MD  [name], MD   REFERRING PHYSICIAN: [name], MD  DIAGNOSIS: No diagnosis found.   Cancer Staging  No matching staging information was found for the patient.  Stage *** Right Breast UOQ, Invasive ductal carcinoma with medullary features and right axillary lymph node involvement, ER+ / PR- / Her2-, Grade 2  CHIEF COMPLAINT: Here to discuss management of right breast cancer  HISTORY OF PRESENT ILLNESS::Heidi Mitchell is a 72 y.o. female who presented to her PCP with last month with focal pain and a palpable lump in the right breast which both had been present for several months. She also endorsed diffuse intermittent left breast pain. She subsequently presented for a bilateral diagnostic mammogram and right breast ultrasound performed on 02/28/23 which demonstrated a suspicious mass in the 12 o'clock right breast, located 4 cmfn, correlating the palpable site of concern. Evidence of right axillary lymphadenopathy was also appreciated. No evidence of malignancy was appreciated in the left breast.   Biopsy of the 12 o'clock right breast on date of 03/04/23 showed grade 2 invasive ductal carcinoma with medullary features measuring 7 mm in the greatest linear extent of the sample.  ER status: 60% positive with weak staining intensity; PR status 0% negative; Proliferation marker Ki67 at 80%; Her2 status negative; Grade 2. Right axillary lymph node biopsy also performed on 03/04/23 showed metastatic carcinoma measuring 7 mm in the greatest extent of the sample; focally suspicious for extranodal extension.   ***  PREVIOUS RADIATION THERAPY: No  PAST MEDICAL HISTORY:  has a past medical history of Hyperlipidemia, Intracervical pessary, Unspecified disorder of bladder, and  Uterine prolapse.    PAST SURGICAL HISTORY: Past Surgical History:  Procedure Laterality Date   APPENDECTOMY  1990   bartholins cyst  1978   BREAST BIOPSY Left    benign   BREAST BIOPSY Right 03/04/2023   [name] RT BREAST BX W LOC DEV 1ST LESION IMG BX SPEC US GUIDE 03/04/2023 [facility] MAMMOGRAPHY   COLONOSCOPY WITH PROPOFOL N/A 09/27/2018   Procedure: COLONOSCOPY WITH PROPOFOL;  Surgeon: Wyline Mood, MD;  Location: [facility] ENDOSCOPY;  Service: Gastroenterology;  Laterality: N/A;    FAMILY HISTORY: family history includes Alcohol abuse in her mother; Other in her son; Uterine cancer in her sister.  SOCIAL HISTORY:  reports that she has never smoked. She has been exposed to tobacco smoke. She has never used smokeless tobacco. She reports that she does not drink alcohol and does not use drugs.  ALLERGIES: Sertraline hcl  MEDICATIONS:  Current Outpatient Medications  Medication Sig Dispense Refill   atorvastatin (LIPITOR) 10 MG tablet Take 1 tablet (10 mg total) by mouth daily. 90 tablet 1   omeprazole (PRILOSEC) 20 MG capsule Take 1 capsule (20 mg total) by mouth every other day. On an empty stomach 45 capsule 1   sodium fluoride (PREVIDENT 5000 PLUS) 1.1 % CREA dental cream Take by mouth.     sulfamethoxazole-trimethoprim (BACTRIM DS) 800-160 MG tablet Take 1 tablet by mouth 2 (two) times daily. 6 tablet 1   No current facility-administered medications for this encounter.    REVIEW OF SYSTEMS: As above in HPI.   PHYSICAL EXAM:  vitals were not taken for this visit.   General: Alert and oriented, in no acute distress HEENT: Head is normocephalic. Extraocular  movements are intact. Oropharynx is clear. Neck: Neck is supple, no palpable cervical or supraclavicular lymphadenopathy. Heart: Regular in rate and rhythm with no murmurs, rubs, or gallops. Chest: Clear to auscultation bilaterally, with no rhonchi, wheezes, or rales. Abdomen: Soft, nontender, nondistended, with no rigidity or  guarding. Extremities: No cyanosis or edema. Lymphatics: see Neck Exam Skin: No concerning lesions. Musculoskeletal: symmetric strength and muscle tone throughout. Neurologic: Cranial nerves II through XII are grossly intact. No obvious focalities. Speech is fluent. Coordination is intact. Psychiatric: Judgment and insight are intact. Affect is appropriate. Breasts: *** . No other palpable masses appreciated in the breasts or axillae *** .    ECOG = ***  0 - Asymptomatic (Fully active, able to carry on all predisease activities without restriction)  1 - Symptomatic but completely ambulatory (Restricted in physically strenuous activity but ambulatory and able to carry out work of a light or sedentary nature. For example, light housework, office work)  2 - Symptomatic, <50% in bed during the day (Ambulatory and capable of all self care but unable to carry out any work activities. Up and about more than 50% of waking hours)  3 - Symptomatic, >50% in bed, but not bedbound (Capable of only limited self-care, confined to bed or chair 50% or more of waking hours)  4 - Bedbound (Completely disabled. Cannot carry on any self-care. Totally confined to bed or chair)  5 - Death   Santiago Glad MM, Creech RH, Tormey DC, et al. [phone]). "Toxicity and response criteria of the [facility]". Am. Evlyn Clines. Oncol. 5 (6): 649-55   LABORATORY DATA:  Lab Results  Component Value Date   WBC 6.9 06/03/2022   HGB 13.1 06/03/2022   HCT 40.1 06/03/2022   MCV 86.3 06/03/2022   PLT 213.0 06/03/2022   CMP     Component Value Date/Time   NA 137 06/03/2022 0920   K 4.1 06/03/2022 0920   CL 103 06/03/2022 0920   CO2 26 06/03/2022 0920   GLUCOSE 87 06/03/2022 0920   BUN 15 06/03/2022 0920   CREATININE 0.69 06/03/2022 0920   CALCIUM 9.6 06/03/2022 0920   PROT 6.8 06/03/2022 0920   ALBUMIN 4.2 06/03/2022 0920   AST 17 06/03/2022 0920   ALT 11 06/03/2022 0920   ALKPHOS 61 06/03/2022 0920    BILITOT 1.8 (H) 06/03/2022 0920   GFRNONAA >60 09/24/2020 1039         RADIOGRAPHY: [name] RT BREAST BX W LOC DEV 1ST LESION IMG BX SPEC US GUIDE  Addendum Date: 03/07/2023   ADDENDUM REPORT: 03/07/2023 13:19 ADDENDUM: PATHOLOGY revealed: Site 1. Breast, RIGHT, needle core biopsy, 12:00 4 cm fn - INVASIVE DUCTAL CARCINOMA WITH MEDULLARY FEATURES, SEE NOTE - TUBULE FORMATION: SCORE 3 - NUCLEAR PLEOMORPHISM: SCORE 2 - MITOTIC COUNT: SCORE 1 - TOTAL SCORE: 6 - OVERALL GRADE: 2 - LYMPHOVASCULAR INVASION: NOT IDENTIFIED - CANCER LENGTH: 0.7 CM - CALCIFICATIONS: NOT IDENTIFIED OTHER FINDINGS: NONE Pathology results are CONCORDANT with imaging findings, per Dr. Quincy Carnes. PATHOLOGY revealed: Site 2. Lymph node, needle/core biopsy, RIGHT axilla - METASTATIC CARCINOMA TO A LYMPH NODE, MEASURING 0.7 CM IN GREATEST DIMENSION - FOCALLY SUSPICIOUS FOR EXTRANODAL EXTENSION Pathology results are CONCORDANT with imaging findings, per Dr. Quincy Carnes. Pathology results and recommendations below were discussed with patient by telephone on 03/07/2023. Patient reported no issues at the biopsy site apart from slight tenderness. Post biopsy care instructions were reviewed, questions were answered and my direct phone number was provided to  patient. Patient was instructed to call [facility] if any concerns or questions arise related to the biopsy. The patient was referred to the [facility] at [facility] with appointment on 03/16/2023. Pathology results reported by Lynett Grimes, RN on 03/07/2023. Electronically Signed   By: Hulan Saas M.D.   On: 03/07/2023 13:19   Result Date: 03/07/2023 CLINICAL DATA:  72 year old with a painful palpable highly suspicious approximate 1.7 cm mass in the upper RIGHT breast at 12 o'clock 4 cm from the nipple. There are at least 3 abnormal RIGHT axillary lymph nodes; the node with the greatest cortical  thickening of 6 mm is sampled. EXAM: ULTRASOUND-GUIDED RIGHT BREAST CORE NEEDLE BIOPSY ULTRASOUND-GUIDED RIGHT AXILLARY LYMPH NODE CORE NEEDLE BIOPSY COMPARISON:  Previous exam(s). PROCEDURE: I met with the patient and we discussed the procedure of ultrasound-guided biopsy, including benefits and alternatives. We discussed the high likelihood of a successful procedure. We discussed the risks of the procedure, including infection, bleeding, tissue injury, clip migration, and inadequate sampling. Informed written consent was given. The usual time-out protocol was performed immediately prior to the procedure. #1) RIGHT breast, lesion quadrant: Upper breast, 12 o'clock location Using sterile technique with chlorhexidine as skin antisepsis, 1% lidocaine as local anesthetic, under direct ultrasound visualization, a 12 gauge Bard Marquee core needle device placed through an 11 gauge introducer needle was used to perform biopsy of the mass in the upper breast using a lateral approach. At the conclusion of the procedure, shaped tissue marker clip was deployed into the biopsy cavity. # 2) RIGHT axillary lymph node: Using sterile technique with chlorhexidine as skin antisepsis, 1% lidocaine and 1% lidocaine with epinephrine as local anesthetic, under direct ultrasound visualization, a 14 gauge Bard Marquee core needle device placed through a 13 gauge introducer needle was used perform biopsy of the most abnormal lymph node in the RIGHT axilla using an inferolateral approach. At the conclusion of the procedure, a HydroMark spiral shaped tissue marking clip was deployed into the biopsy cavity. The patient tolerated the procedures well without apparent immediate complications. Follow up 2 view mammogram was performed and dictated separately. IMPRESSION: 1. Ultrasound-guided core needle biopsy of a highly suspicious 1.7 cm mass in the upper RIGHT breast. 2. Ultrasound-guided core needle biopsy of an abnormal RIGHT axillary lymph  node. Electronically Signed: By: Hulan Saas M.D. On: 03/04/2023 16:08  [name] AXILLARY NODE CORE BIOPSY RIGHT  Addendum Date: 03/07/2023   ADDENDUM REPORT: 03/07/2023 13:19 ADDENDUM: PATHOLOGY revealed: Site 1. Breast, RIGHT, needle core biopsy, 12:00 4 cm fn - INVASIVE DUCTAL CARCINOMA WITH MEDULLARY FEATURES, SEE NOTE - TUBULE FORMATION: SCORE 3 - NUCLEAR PLEOMORPHISM: SCORE 2 - MITOTIC COUNT: SCORE 1 - TOTAL SCORE: 6 - OVERALL GRADE: 2 - LYMPHOVASCULAR INVASION: NOT IDENTIFIED - CANCER LENGTH: 0.7 CM - CALCIFICATIONS: NOT IDENTIFIED OTHER FINDINGS: NONE Pathology results are CONCORDANT with imaging findings, per Dr. Quincy Carnes. PATHOLOGY revealed: Site 2. Lymph node, needle/core biopsy, RIGHT axilla - METASTATIC CARCINOMA TO A LYMPH NODE, MEASURING 0.7 CM IN GREATEST DIMENSION - FOCALLY SUSPICIOUS FOR EXTRANODAL EXTENSION Pathology results are CONCORDANT with imaging findings, per Dr. Quincy Carnes. Pathology results and recommendations below were discussed with patient by telephone on 03/07/2023. Patient reported no issues at the biopsy site apart from slight tenderness. Post biopsy care instructions were reviewed, questions were answered and my direct phone number was provided to patient. Patient was instructed to call [facility] if any  concerns or questions arise related to the biopsy. The patient was referred to the [facility] at [facility] with appointment on 03/16/2023. Pathology results reported by Lynett Grimes, RN on 03/07/2023. Electronically Signed   By: Hulan Saas M.D.   On: 03/07/2023 13:19   Result Date: 03/07/2023 CLINICAL DATA:  72 year old with a painful palpable highly suspicious approximate 1.7 cm mass in the upper RIGHT breast at 12 o'clock 4 cm from the nipple. There are at least 3 abnormal RIGHT axillary lymph nodes; the node with the greatest cortical thickening of 6 mm is sampled. EXAM:  ULTRASOUND-GUIDED RIGHT BREAST CORE NEEDLE BIOPSY ULTRASOUND-GUIDED RIGHT AXILLARY LYMPH NODE CORE NEEDLE BIOPSY COMPARISON:  Previous exam(s). PROCEDURE: I met with the patient and we discussed the procedure of ultrasound-guided biopsy, including benefits and alternatives. We discussed the high likelihood of a successful procedure. We discussed the risks of the procedure, including infection, bleeding, tissue injury, clip migration, and inadequate sampling. Informed written consent was given. The usual time-out protocol was performed immediately prior to the procedure. #1) RIGHT breast, lesion quadrant: Upper breast, 12 o'clock location Using sterile technique with chlorhexidine as skin antisepsis, 1% lidocaine as local anesthetic, under direct ultrasound visualization, a 12 gauge Bard Marquee core needle device placed through an 11 gauge introducer needle was used to perform biopsy of the mass in the upper breast using a lateral approach. At the conclusion of the procedure, shaped tissue marker clip was deployed into the biopsy cavity. # 2) RIGHT axillary lymph node: Using sterile technique with chlorhexidine as skin antisepsis, 1% lidocaine and 1% lidocaine with epinephrine as local anesthetic, under direct ultrasound visualization, a 14 gauge Bard Marquee core needle device placed through a 13 gauge introducer needle was used perform biopsy of the most abnormal lymph node in the RIGHT axilla using an inferolateral approach. At the conclusion of the procedure, a HydroMark spiral shaped tissue marking clip was deployed into the biopsy cavity. The patient tolerated the procedures well without apparent immediate complications. Follow up 2 view mammogram was performed and dictated separately. IMPRESSION: 1. Ultrasound-guided core needle biopsy of a highly suspicious 1.7 cm mass in the upper RIGHT breast. 2. Ultrasound-guided core needle biopsy of an abnormal RIGHT axillary lymph node. Electronically Signed: By:  Hulan Saas M.D. On: 03/04/2023 16:08  MM CLIP PLACEMENT RIGHT  Result Date: 03/04/2023 CLINICAL DATA:  Confirmation of clip placement after ultrasound-guided core needle biopsy of a suspicious mass in the upper RIGHT breast and ultrasound-guided core needle biopsy of an abnormal RIGHT axillary lymph node. EXAM: 2D and 3D DIAGNOSTIC RIGHT MAMMOGRAM POST ULTRASOUND BIOPSY COMPARISON:  Previous exam(s). FINDINGS: 2D and 3D full field CC and MLO mammographic images were obtained following ultrasound guided biopsy of a mass in the upper RIGHT breast and ultrasound guided biopsy of an abnormal RIGHT axillary lymph node. The ribbon shaped tissue marking clip is appropriately positioned within the biopsied mass in the upper breast at middle depth. The [facility] spiral shaped tissue marking clip is not identified due to the depth of the lymph node in the axilla Expected post biopsy changes are present at both sites without evidence of hematoma. IMPRESSION: 1. Appropriate positioning of the ribbon shaped biopsy marking clip within the biopsied mass in the upper RIGHT breast. 2. The HydroMark spiral shaped tissue marking clip is not visible on the mammogram due to the deep location of the biopsied lymph node in the RIGHT axilla. Final Assessment: Post Procedure Mammograms  for Marker Placement Electronically Signed   By: Hulan Saas M.D.   On: 03/04/2023 16:08  MM 3D DIAGNOSTIC MAMMOGRAM BILATERAL BREAST  Result Date: 02/28/2023 CLINICAL DATA:  Focal pain and palpable lump in the right breast for several months. Diffuse intermittent pain in the left breast. EXAM: DIGITAL DIAGNOSTIC BILATERAL MAMMOGRAM WITH TOMOSYNTHESIS; ULTRASOUND RIGHT BREAST LIMITED TECHNIQUE: Bilateral digital diagnostic mammography and breast tomosynthesis was performed.; Targeted ultrasound examination of the right breast was performed COMPARISON:  Previous exam(s). ACR Breast Density Category c: The breasts are heterogeneously dense,  which may obscure small masses. FINDINGS: A radiopaque BB is placed at the site of the patient's palpable lump in the upper right breast. A spiculated, hyperdense mass is seen deep to the radiopaque BB. Otherwise, there are no new or suspicious mammographic findings in the remainder of either breast. Targeted ultrasound is performed, showing an irregular, hypoechoic mass with internal vascularity at the 12 o'clock position 4 cm from the nipple on the right. It measures 1.7 x 1.5 x 1.6 cm. This correlates with the mammographic finding. Several morphologically abnormal lymph nodes are identified within the left axilla with diffuse cortical thickening up to 6 mm. IMPRESSION: 1. Suspicious right breast mass corresponding with the patient's palpable lump at the 12 o'clock position. Recommend ultrasound-guided biopsy. 2. Suspicious right axillary lymphadenopathy. Recommend ultrasound-guided biopsy. 3. No mammographic evidence of malignancy on the left. RECOMMENDATION: Two area ultrasound-guided biopsy of the right breast and axilla. I have discussed the findings and recommendations with the patient. If applicable, a reminder letter will be sent to the patient regarding the next appointment. BI-RADS CATEGORY  5: Highly suggestive of malignancy. Electronically Signed   By: Sande Brothers M.D.   On: 02/28/2023 14:25  [name] LIMITED ULTRASOUND INCLUDING AXILLA RIGHT BREAST  Result Date: 02/28/2023 CLINICAL DATA:  Focal pain and palpable lump in the right breast for several months. Diffuse intermittent pain in the left breast. EXAM: DIGITAL DIAGNOSTIC BILATERAL MAMMOGRAM WITH TOMOSYNTHESIS; ULTRASOUND RIGHT BREAST LIMITED TECHNIQUE: Bilateral digital diagnostic mammography and breast tomosynthesis was performed.; Targeted ultrasound examination of the right breast was performed COMPARISON:  Previous exam(s). ACR Breast Density Category c: The breasts are heterogeneously dense, which may obscure small masses. FINDINGS: A  radiopaque BB is placed at the site of the patient's palpable lump in the upper right breast. A spiculated, hyperdense mass is seen deep to the radiopaque BB. Otherwise, there are no new or suspicious mammographic findings in the remainder of either breast. Targeted ultrasound is performed, showing an irregular, hypoechoic mass with internal vascularity at the 12 o'clock position 4 cm from the nipple on the right. It measures 1.7 x 1.5 x 1.6 cm. This correlates with the mammographic finding. Several morphologically abnormal lymph nodes are identified within the left axilla with diffuse cortical thickening up to 6 mm. IMPRESSION: 1. Suspicious right breast mass corresponding with the patient's palpable lump at the 12 o'clock position. Recommend ultrasound-guided biopsy. 2. Suspicious right axillary lymphadenopathy. Recommend ultrasound-guided biopsy. 3. No mammographic evidence of malignancy on the left. RECOMMENDATION: Two area ultrasound-guided biopsy of the right breast and axilla. I have discussed the findings and recommendations with the patient. If applicable, a reminder letter will be sent to the patient regarding the next appointment. BI-RADS CATEGORY  5: Highly suggestive of malignancy. Electronically Signed   By: Sande Brothers M.D.   On: 02/28/2023 14:25     IMPRESSION/PLAN: ***   It was a pleasure meeting the patient today. We discussed the risks,  benefits, and side effects of radiotherapy. I recommend radiotherapy to the *** to reduce her risk of locoregional recurrence by 2/3.  We discussed that radiation would take approximately *** weeks to complete and that I would give the patient a few weeks to heal following surgery before starting treatment planning. *** If chemotherapy were to be given, this would precede radiotherapy. We spoke about acute effects including skin irritation and fatigue as well as much less common late effects including internal organ injury or irritation. We spoke about the  latest technology that is used to minimize the risk of late effects for patients undergoing radiotherapy to the breast or chest wall. No guarantees of treatment were given. The patient is enthusiastic about proceeding with treatment. I look forward to participating in the patient's care.  I will await her referral back to me for postoperative follow-up and eventual CT simulation/treatment planning.  On date of service, in total, I spent *** minutes on this encounter. Patient was seen in person.   __________________________________________   Lonie Peak, MD  This document serves as a record of services personally performed by Lonie Peak, MD. It was created on her behalf by Neena Rhymes, a trained medical scribe. The creation of this record is based on the scribe's personal observations and the provider's statements to them. This document has been checked and approved by the attending provider.

## 2023-03-16 ENCOUNTER — Other Ambulatory Visit: Payer: Self-pay

## 2023-03-16 ENCOUNTER — Encounter: Payer: Self-pay | Admitting: Radiation Oncology

## 2023-03-16 ENCOUNTER — Inpatient Hospital Stay: Payer: Medicare HMO | Attending: Hematology and Oncology | Admitting: Hematology and Oncology

## 2023-03-16 ENCOUNTER — Inpatient Hospital Stay: Payer: Medicare HMO

## 2023-03-16 ENCOUNTER — Encounter: Payer: Self-pay | Admitting: Physical Therapy

## 2023-03-16 ENCOUNTER — Encounter: Payer: Self-pay | Admitting: Hematology and Oncology

## 2023-03-16 ENCOUNTER — Encounter: Payer: Self-pay | Admitting: Emergency Medicine

## 2023-03-16 ENCOUNTER — Inpatient Hospital Stay (HOSPITAL_BASED_OUTPATIENT_CLINIC_OR_DEPARTMENT_OTHER): Payer: Medicare HMO | Admitting: Genetic Counselor

## 2023-03-16 ENCOUNTER — Ambulatory Visit: Payer: Medicare HMO | Attending: Surgery | Admitting: Physical Therapy

## 2023-03-16 ENCOUNTER — Encounter: Payer: Self-pay | Admitting: *Deleted

## 2023-03-16 ENCOUNTER — Ambulatory Visit
Admission: RE | Admit: 2023-03-16 | Discharge: 2023-03-16 | Disposition: A | Discharge status: Home / Self Care | Payer: Medicare HMO | Source: Ambulatory Visit | Attending: Radiation Oncology | Admitting: Radiation Oncology

## 2023-03-16 ENCOUNTER — Other Ambulatory Visit: Payer: Self-pay | Admitting: Surgery

## 2023-03-16 ENCOUNTER — Encounter: Payer: Self-pay | Admitting: Genetic Counselor

## 2023-03-16 VITALS — BP 130/61 | HR 64 | Temp 98.1°F | Resp 18 | Ht 60.25 in | Wt 115.5 lb

## 2023-03-16 DIAGNOSIS — C50411 Malignant neoplasm of upper-outer quadrant of right female breast: Secondary | ICD-10-CM | POA: Insufficient documentation

## 2023-03-16 DIAGNOSIS — Z79899 Other long term (current) drug therapy: Secondary | ICD-10-CM | POA: Diagnosis not present

## 2023-03-16 DIAGNOSIS — Z5111 Encounter for antineoplastic chemotherapy: Secondary | ICD-10-CM | POA: Diagnosis not present

## 2023-03-16 DIAGNOSIS — Z923 Personal history of irradiation: Secondary | ICD-10-CM | POA: Insufficient documentation

## 2023-03-16 DIAGNOSIS — Z9221 Personal history of antineoplastic chemotherapy: Secondary | ICD-10-CM | POA: Diagnosis not present

## 2023-03-16 DIAGNOSIS — R439 Unspecified disturbances of smell and taste: Secondary | ICD-10-CM | POA: Diagnosis not present

## 2023-03-16 DIAGNOSIS — D709 Neutropenia, unspecified: Secondary | ICD-10-CM | POA: Insufficient documentation

## 2023-03-16 DIAGNOSIS — Z170 Estrogen receptor positive status [ER+]: Secondary | ICD-10-CM

## 2023-03-16 DIAGNOSIS — E785 Hyperlipidemia, unspecified: Secondary | ICD-10-CM | POA: Diagnosis not present

## 2023-03-16 DIAGNOSIS — R293 Abnormal posture: Secondary | ICD-10-CM | POA: Insufficient documentation

## 2023-03-16 DIAGNOSIS — R5383 Other fatigue: Secondary | ICD-10-CM | POA: Insufficient documentation

## 2023-03-16 DIAGNOSIS — C50911 Malignant neoplasm of unspecified site of right female breast: Secondary | ICD-10-CM | POA: Diagnosis not present

## 2023-03-16 DIAGNOSIS — Z803 Family history of malignant neoplasm of breast: Secondary | ICD-10-CM

## 2023-03-16 DIAGNOSIS — R21 Rash and other nonspecific skin eruption: Secondary | ICD-10-CM | POA: Insufficient documentation

## 2023-03-16 LAB — CMP (CANCER CENTER ONLY)
ALT: 13 U/L (ref 0–44)
AST: 17 U/L (ref 15–41)
Albumin: 4 g/dL (ref 3.5–5.0)
Alkaline Phosphatase: 63 U/L (ref 38–126)
Anion gap: 5 (ref 5–15)
BUN: 11 mg/dL (ref 8–23)
CO2: 29 mmol/L (ref 22–32)
Calcium: 9.6 mg/dL (ref 8.9–10.3)
Chloride: 108 mmol/L (ref 98–111)
Creatinine: 0.73 mg/dL (ref 0.44–1.00)
GFR, Estimated: >60 mL/min (ref >60)
Glucose, Bld: 94 mg/dL (ref 70–99)
Potassium: 4.7 mmol/L (ref 3.5–5.1)
Sodium: 142 mmol/L (ref 135–145)
Total Bilirubin: 1.1 mg/dL (ref 0.3–1.2)
Total Protein: 6.4 g/dL — ABNORMAL LOW (ref 6.5–8.1)

## 2023-03-16 LAB — CBC WITH DIFFERENTIAL (CANCER CENTER ONLY)
Abs Immature Granulocytes: 0.02 10*3/uL (ref 0.00–0.07)
Basophils Absolute: 0.1 10*3/uL (ref 0.0–0.1)
Basophils Relative: 1 %
Eosinophils Absolute: 0.1 10*3/uL (ref 0.0–0.5)
Eosinophils Relative: 1 %
HCT: 40.3 % (ref 36.0–46.0)
Hemoglobin: 12.5 g/dL (ref 12.0–15.0)
Immature Granulocytes: 0 %
Lymphocytes Relative: 24 %
Lymphs Abs: 1.7 10*3/uL (ref 0.7–4.0)
MCH: 28.1 pg (ref 26.0–34.0)
MCHC: 31 g/dL (ref 30.0–36.0)
MCV: 90.6 fL (ref 80.0–100.0)
Monocytes Absolute: 0.8 10*3/uL (ref 0.1–1.0)
Monocytes Relative: 11 %
Neutro Abs: 4.5 10*3/uL (ref 1.7–7.7)
Neutrophils Relative %: 63 %
Platelet Count: 251 10*3/uL (ref 150–400)
RBC: 4.45 MIL/uL (ref 3.87–5.11)
RDW: 14.8 % (ref 11.5–15.5)
WBC Count: 7.1 10*3/uL (ref 4.0–10.5)
nRBC: 0 % (ref 0.0–0.2)

## 2023-03-16 LAB — GENETIC SCREENING ORDER

## 2023-03-16 MED ORDER — LIDOCAINE-PRILOCAINE 2.5-2.5 % EX CREA
TOPICAL_CREAM | CUTANEOUS | 3 refills | Status: DC
Start: 2023-03-16 — End: 2023-07-05

## 2023-03-16 MED ORDER — PROCHLORPERAZINE MALEATE 10 MG PO TABS
10.0000 mg | ORAL_TABLET | Freq: Four times a day (QID) | ORAL | 1 refills | Status: DC | PRN
Start: 2023-03-16 — End: 2023-08-03

## 2023-03-16 MED ORDER — DEXAMETHASONE 4 MG PO TABS
ORAL_TABLET | ORAL | 0 refills | Status: DC
Start: 2023-03-16 — End: 2023-08-03

## 2023-03-16 MED ORDER — ONDANSETRON HCL 8 MG PO TABS
8.0000 mg | ORAL_TABLET | Freq: Three times a day (TID) | ORAL | 1 refills | Status: DC | PRN
Start: 2023-03-16 — End: 2023-08-03

## 2023-03-16 NOTE — Progress Notes (Signed)
REFERRING PROVIDER: Serena Croissant, MD [address] [place],  [place] [address]  PRIMARY PROVIDER:  Karie Schwalbe, MD  PRIMARY REASON FOR VISIT:  1. Malignant neoplasm of upper-outer quadrant of right breast in female, estrogen receptor positive (HCC)   2. Family history of breast cancer     HISTORY OF PRESENT ILLNESS:   Ms. Barquin, a 72 y.o. female, was seen for a [facility] cancer genetics consultation at the request of Dr. Pamelia Hoit due to a personal history of breast cancer.  Ms. Cragun presents to clinic today to discuss the possibility of a hereditary predisposition to cancer, to discuss genetic testing, and to further clarify her future cancer risks, as well as potential cancer risks for family members.   In June 2024, at the age of 28, Ms. Vivian was diagnosed with invasive ductal carcinoma of the right breast (ER+ 60% weak/PR-/HER2-). The treatment plan includes neoadjuvant chemotherapy.    CANCER HISTORY:  Oncology History  Malignant neoplasm of upper-outer quadrant of right breast in female, estrogen receptor positive (HCC)  03/04/2023 Initial Diagnosis   Palpable right breast lump UOQ 12 o'clock position: 1.7 cm by ultrasound, 3 abnormal lymph nodes: 1 biopsy positive with extranodal extension.  Biopsy of the lump: Grade 2 IDC with medullary features ER 60% weak, PR 0%, Ki-67 80%, HER2 negative   03/16/2023 Cancer Staging   Staging form: Breast, AJCC 8th Edition - Clinical: Stage IIA (cT1c, cN1, cM0, G2, ER+, PR-, HER2-) - Signed by Serena Croissant, MD on 03/16/2023 Stage prefix: Initial diagnosis Histologic grading system: 3 grade system   03/23/2023 -  Chemotherapy   Patient is on Treatment Plan : BREAST TC q21d      Past Medical History:  Diagnosis Date   Hyperlipidemia    Intracervical pessary    Unspecified disorder of bladder    Uterine prolapse     Past Surgical History:  Procedure Laterality Date   APPENDECTOMY  1990   bartholins cyst  1978    BREAST BIOPSY Left    benign   BREAST BIOPSY Right 03/04/2023   [name] RT BREAST BX W LOC DEV 1ST LESION IMG BX SPEC US GUIDE 03/04/2023 [facility] MAMMOGRAPHY   COLONOSCOPY WITH PROPOFOL N/A 09/27/2018   Procedure: COLONOSCOPY WITH PROPOFOL;  Surgeon: Wyline Mood, MD;  Location: [facility] ENDOSCOPY;  Service: Gastroenterology;  Laterality: N/A;    FAMILY HISTORY:  We obtained a detailed, 4-generation family history.  Significant diagnoses are listed below: Family History  Problem Relation Age of Onset   Cervical cancer Sister 63   Breast cancer Maternal Aunt        dx 31s      Ms. Braman is unaware of previous family history of genetic testing for hereditary cancer risks. There is no reported Ashkenazi Jewish ancestry. There is no known consanguinity.  GENETIC COUNSELING ASSESSMENT: Ms. Pietropaolo is a 72 y.o. female with a personal and family history which is somewhat suggestive of a hereditary cancer syndrome and predisposition to cancer given the presence of breast cancer in multiple generations her aunt's diagnosis before age 26. We, therefore, discussed and recommended the following at today's visit.   DISCUSSION: We discussed that 5 - 10% of cancer is hereditary.  Most cases of hereditary breast cancer are associated with mutations in BRCA1/2.  There are other genes that can be associated with hereditary breast cancer syndromes.  We discussed that testing is beneficial for several reasons including knowing how to follow individuals for their cancer risks and  understanding if other family members could be at risk for cancer and allowing them to undergo genetic testing.   We reviewed the characteristics, features and inheritance patterns of hereditary cancer syndromes. We also discussed genetic testing, including the appropriate family members to test, the process of testing, insurance coverage and turn-around-time for results. We discussed the implications of a negative, positive, carrier and/or  variant of uncertain significant result. We recommended Ms. Vessel pursue genetic testing for a panel that includes genes associated with breast cancer and other cancers.    The [facility] Common Hereditary Cancers + RNA Panel includes sequencing, deletion/duplication, and RNA analysis of the following 48 genes: APC, ATM, AXIN2, BAP1, BARD1, BMPR1A, BRCA1, BRCA2, BRIP1, CDH1, CDK4*, CDKN2A*, CHEK2, CTNNA1, DICER1, EPCAM* (del/dup only), FH, GREM1* (promoter dup analysis only), HOXB13*, KIT*, MBD4*, MEN1, MLH1, MSH2, MSH3, MSH6, MUTYH, NF1, NTHL1, PALB2, PDGFRA*, PMS2, POLD1, POLE, PTEN, RAD51C, RAD51D, SDHA (sequencing only), SDHB, SDHC, SDHD, SMAD4, SMARCA4, STK11, TP53, TSC1, TSC2, VHL.  *Genes without RNA analysis.   Based on Ms. Doan's personal history of breast cancer and her aunt's history of breast cancer before age 29, she meets medical criteria for genetic testing. Despite that she meets criteria, she may still have an out of pocket cost. We discussed that if her out of pocket cost for testing is over $100, the laboratory should contact her and discuss the self-pay prices and/or patient pay assistance programs.    PLAN: After considering the risks, benefits, and limitations, Ms. Brassell provided informed consent to pursue genetic testing and the blood sample was sent to [facility] for analysis of the Common Hereditary Cancers +RNA Panel. Results should be available within approximately 3 weeks' time, at which point they will be disclosed by telephone to Ms. Corcoran, as will any additional recommendations warranted by these results. Ms. Frierson will receive a summary of her genetic counseling visit and a copy of her results once available. This information will also be available in Epic.   Ms. Veronica questions were answered to her satisfaction today. Our contact information was provided should additional questions or concerns arise. Thank you for the referral and allowing [name] to share in  the care of your patient.   Ely Spragg M. Rennie Plowman, MS, [facility] Genetic Counselor [email or web address] (P) [phone]  The patient was seen for a total of 16 minutes in face-to-face genetic counseling.  Patient was accompanied by her husband.  Drs. Pamelia Hoit was available to discuss this case as needed.    _______________________________________________________________________ For Office Staff:  Number of people involved in session: 2 Was an Intern/ student involved with case: no

## 2023-03-16 NOTE — Research (Signed)
[employer] 2021-05 - Specimen Collection Study to Evaluate Biomarkers in Subjects with Cancer   INTRO TO STUDY/CONSENT FORMS  Patient Heidi Mitchell was identified by Clinical Research as a potential candidate for the above listed study.  This Clinical Research Nurse met with TIFANNY CHACON, [id number], on 03/16/23 in a manner and location that ensures patient privacy to discuss participation in the above listed research study.  Patient is Accompanied by her spouse .  A copy of the informed consent document with embedded HIPAA language was provided to the patient.  Patient reads, speaks, and understands [place].   Patient was provided with the business card of this Nurse and encouraged to contact the research team with any questions.  Approximately 15 minutes were spent with the patient reviewing the informed consent documents.  Patient was provided the option of taking informed consent documents home to review and was encouraged to review at their convenience with their support network, including other care providers. Patient took the consent documents home to review.  Will f/u with patient next week on interest.  Lurena Joiner 'Warden FillersDairl Ponder, RN, BSN, [facility] Clinical Research Nurse I 03/16/23 2:59 PM

## 2023-03-16 NOTE — Assessment & Plan Note (Addendum)
03/04/2023:Palpable right breast lump UOQ 12 o'clock position: 1.7 cm by ultrasound, 3 abnormal lymph nodes: 1 biopsy positive with extranodal extension.  Biopsy of the lump: Grade 2 IDC with medullary features ER 60% weak, PR 0%, Ki-67 80%, HER2 negative  Pathology and radiology counseling: Discussed with the patient, the details of pathology including the type of breast cancer,the clinical staging, the significance of ER, PR and HER-2/neu receptors and the implications for treatment. After reviewing the pathology in detail, we proceeded to discuss the different treatment options between surgery, radiation, chemotherapy, antiestrogen therapies.  Treatment plan: Neoadjuvant chemotherapy with Taxotere and Cytoxan every 3 weeks x 4 Breast conserving surgery with targeted node dissection Adjuvant radiation Adjuvant antiestrogen therapy if the final pathology is truly is ER positive.  Plan: Breast MRI, staging scans, port, echo, chemo class and start chemotherapy in 2 weeks  Chemotherapy Counseling: I discussed the risks and benefits of chemotherapy including the risks of nausea/ vomiting, risk of infection from low WBC count, fatigue due to chemo or anemia, bruising or bleeding due to low platelets, mouth sores, loss/ change in taste and decreased appetite. Liver and kidney function will be monitored through out chemotherapy as abnormalities in liver and kidney function may be a side effect of treatment. neuropathy risk from Taxol was discussed in detail. Risk of permanent bone marrow dysfunction and leukemia due to chemo were also discussed.  Return to clinic in 2 weeks to start chemotherapy

## 2023-03-16 NOTE — Progress Notes (Signed)
START ON PATHWAY REGIMEN - Breast     A cycle is every 21 days:     Cyclophosphamide      Docetaxel   **Always confirm dose/schedule in your pharmacy ordering system**  Patient Characteristics: Preoperative or Nonsurgical Candidate, M0 (Clinical Staging), Up to cT4c, Any N, M0, Neoadjuvant Therapy followed by Surgery, Invasive Disease, Chemotherapy, HER2 Negative, ER Positive Therapeutic Status: Preoperative or Nonsurgical Candidate, M0 (Clinical Staging) AJCC M Category: cM0 AJCC Grade: G2 ER Status: Positive (+) AJCC 8 Stage Grouping: IIA HER2 Status: Negative (-) AJCC T Category: cT1c AJCC N Category: cN1 PR Status: Negative (-) Breast Surgical Plan: Neoadjuvant Therapy followed by Surgery Intent of Therapy: Curative Intent, Discussed with Patient

## 2023-03-16 NOTE — Progress Notes (Signed)
[facility] CONSULT NOTE  Patient Care Team: Karie Schwalbe, MD as PCP - General (Internal Medicine) Gladys Damme as Consulting Physician Hildred Laser, MD as Referring Physician (Obstetrics and Gynecology) Serena Croissant, MD as Consulting Physician (Hematology and Oncology) Lonie Peak, MD as Attending Physician (Radiation Oncology) [name], MD as Consulting Physician (General Surgery) Donnelly Angelica, RN as Oncology Nurse Navigator Pershing Proud, RN as Oncology Nurse Navigator  CHIEF COMPLAINTS/PURPOSE OF CONSULTATION:  Newly diagnosed breast cancer  HISTORY OF PRESENTING ILLNESS:  Heidi Mitchell 72 y.o. female is here because of recent diagnosis of right breast cancer.  Patient felt a lump in the right breast measured 1.7 cm by ultrasound and there were also 3 abnormal lymph nodes 1 of which was biopsy-proven to be positive for breast cancer.  Breast biopsy revealed grade 2 IDC ER 60% weak, PR 0%, Ki67 80%, HER2 negative.  She was presented this morning in the multidisciplinary tumor board and she is here today to discuss treatment plan.  I reviewed her records extensively and collaborated the history with the patient.  SUMMARY OF ONCOLOGIC HISTORY: Oncology History  Malignant neoplasm of upper-outer quadrant of right breast in female, estrogen receptor positive (HCC)  03/04/2023 Initial Diagnosis   Palpable right breast lump UOQ 12 o'clock position: 1.7 cm by ultrasound, 3 abnormal lymph nodes: 1 biopsy positive with extranodal extension.  Biopsy of the lump: Grade 2 IDC with medullary features ER 60% weak, PR 0%, Ki-67 80%, HER2 negative   03/16/2023 Cancer Staging   Staging form: Breast, AJCC 8th Edition - Clinical: Stage IIA (cT1c, cN1, cM0, G2, ER+, PR-, HER2-) - Signed by Serena Croissant, MD on 03/16/2023 Stage prefix: Initial diagnosis Histologic grading system: 3 grade system      MEDICAL HISTORY:  Past Medical History:  Diagnosis  Date   Hyperlipidemia    Intracervical pessary    Unspecified disorder of bladder    Uterine prolapse     SURGICAL HISTORY: Past Surgical History:  Procedure Laterality Date   APPENDECTOMY  1990   bartholins cyst  1978   BREAST BIOPSY Left    benign   BREAST BIOPSY Right 03/04/2023   [name] RT BREAST BX W LOC DEV 1ST LESION IMG BX SPEC US GUIDE 03/04/2023 [facility]AMMOGRAPHY   COLONOSCOPY WITH PROPOFOL N/A 09/27/2018   Procedure: COLONOSCOPY WITH PROPOFOL;  Surgeon: Wyline Mood, MD;  Location: [facility] ENDOSCOPY;  Service: Gastroenterology;  Laterality: N/A;    SOCIAL HISTORY: Social History   Socioeconomic History   Marital status: Married    Spouse name: Mitzie Na   Number of children: 1   Years of education: high school   Highest education level: Not on file  Occupational History   Not on file  Tobacco Use   Smoking status: Never    Passive exposure: Current   Smokeless tobacco: Never  Vaping Use   Vaping Use: Never used  Substance and Sexual Activity   Alcohol use: No   Drug use: No   Sexual activity: Not Currently    Birth control/protection: Abstinence  Other Topics Concern   Not on file  Social History Narrative   Does not have living will   Husband and son aware of wishes.   Pt would desires CPR but would not want prolonged life support if futile.   03/06/19   From: the area   Living: husband, Mitzie Na   Work: retired, but finances OK      Family: Son, Marcial Pacas  who lives nearby, 2 grandchildren (45 and 77 yo), and a Physicist, medical on the way      Enjoys: fishing, hiking, camping, outdoors activity      Exercise: walking daily - 1.5 mile route that she does   Diet: not great - too much junk food, tries to do calcium      Safety   Seat belts: Yes    Guns: Yes - hunting gun, locked   Safe in relationships: Yes    Social Determinants of Health   Financial Resource Strain: Low Risk  (08/31/2022)   Overall Financial Resource Strain (CARDIA)    Difficulty  of Paying Living Expenses: Not hard at all  Food Insecurity: No Food Insecurity (08/31/2022)   Hunger Vital Sign    Worried About Running Out of Food in the Last Year: Never true    Ran Out of Food in the Last Year: Never true  Transportation Needs: No Transportation Needs (08/31/2022)   PRAPARE - Administrator, Civil Service (Medical): No    Lack of Transportation (Non-Medical): No  Physical Activity: Insufficiently Active (08/31/2022)   Exercise Vital Sign    Days of Exercise per Week: 7 days    Minutes of Exercise per Session: 20 min  Stress: No Stress Concern Present (08/31/2022)   [employer] of Occupational Health - Occupational Stress Questionnaire    Feeling of Stress : Not at all  Social Connections: Moderately Integrated (08/27/2021)   Social Connection and Isolation Panel [NHANES]    Frequency of Communication with Friends and Family: More than three times a week    Frequency of Social Gatherings with Friends and Family: More than three times a week    Attends Religious Services: 1 to 4 times per year    Active Member of [employer] or Organizations: No    Attends Banker Meetings: Never    Marital Status: Married  Catering manager Violence: Not At Risk (08/27/2021)   Humiliation, Afraid, Rape, and Kick questionnaire    Fear of Current or Ex-Partner: No    Emotionally Abused: No    Physically Abused: No    Sexually Abused: No    FAMILY HISTORY: Family History  Problem Relation Age of Onset   Alcohol abuse Mother    Uterine cancer Sister    Other Son        hole in his brain due to been dropped when he was born   Breast cancer Maternal Aunt     ALLERGIES:  is allergic to sertraline hcl.  MEDICATIONS:  Current Outpatient Medications  Medication Sig Dispense Refill   atorvastatin (LIPITOR) 10 MG tablet Take 1 tablet (10 mg total) by mouth daily. 90 tablet 1   estradiol (ESTRACE) 0.1 MG/GM vaginal cream Place 1 Applicatorful vaginally at  bedtime.     omeprazole (PRILOSEC) 20 MG capsule Take 1 capsule (20 mg total) by mouth every other day. On an empty stomach 45 capsule 1   sodium fluoride (PREVIDENT 5000 PLUS) 1.1 % CREA dental cream Take by mouth. (Patient not taking: Reported on 03/16/2023)     sulfamethoxazole-trimethoprim (BACTRIM DS) 800-160 MG tablet Take 1 tablet by mouth 2 (two) times daily. (Patient not taking: Reported on 03/16/2023) 6 tablet 1   No current facility-administered medications for this visit.    REVIEW OF SYSTEMS:   Constitutional: Denies fevers, chills or abnormal night sweats   All other systems were reviewed with the patient and are negative.  PHYSICAL EXAMINATION: ECOG PERFORMANCE  STATUS: 1 - Symptomatic but completely ambulatory  Vitals:   03/16/23 1243  BP: 130/61  Pulse: 64  Resp: 18  Temp: 98.1 F (36.7 C)  SpO2: 96%   Filed Weights   03/16/23 1243  Weight: 115 lb 8 oz (52.4 kg)    GENERAL:alert, no distress and comfortable    LABORATORY DATA:  I have reviewed the data as listed Lab Results  Component Value Date   WBC 7.1 03/16/2023   HGB 12.5 03/16/2023   HCT 40.3 03/16/2023   MCV 90.6 03/16/2023   PLT 251 03/16/2023   Lab Results  Component Value Date   NA 142 03/16/2023   K 4.7 03/16/2023   CL 108 03/16/2023   CO2 29 03/16/2023    RADIOGRAPHIC STUDIES: I have personally reviewed the radiological reports and agreed with the findings in the report.  ASSESSMENT AND PLAN:  Malignant neoplasm of upper-outer quadrant of right breast in female, estrogen receptor positive (HCC) 03/04/2023:Palpable right breast lump UOQ 12 o'clock position: 1.7 cm by ultrasound, 3 abnormal lymph nodes: 1 biopsy positive with extranodal extension.  Biopsy of the lump: Grade 2 IDC with medullary features ER 60% weak, PR 0%, Ki-67 80%, HER2 negative  Pathology and radiology counseling: Discussed with the patient, the details of pathology including the type of breast cancer,the clinical  staging, the significance of ER, PR and HER-2/neu receptors and the implications for treatment. After reviewing the pathology in detail, we proceeded to discuss the different treatment options between surgery, radiation, chemotherapy, antiestrogen therapies.  Treatment plan: Neoadjuvant chemotherapy with Taxotere and Cytoxan every 3 weeks x 4 Breast conserving surgery with targeted node dissection Adjuvant radiation Adjuvant antiestrogen therapy if the final pathology is truly is ER positive.  Plan: Breast MRI, staging scans, port, echo, chemo class and start chemotherapy in 2 weeks  Chemotherapy Counseling: I discussed the risks and benefits of chemotherapy including the risks of nausea/ vomiting, risk of infection from low WBC count, fatigue due to chemo or anemia, bruising or bleeding due to low platelets, mouth sores, loss/ change in taste and decreased appetite. Liver and kidney function will be monitored through out chemotherapy as abnormalities in liver and kidney function may be a side effect of treatment. neuropathy risk from Taxol was discussed in detail. Risk of permanent bone marrow dysfunction and leukemia due to chemo were also discussed.  Return to clinic in 2 weeks to start chemotherapy   All questions were answered. The patient knows to call the clinic with any problems, questions or concerns.    Tamsen Meek, MD 03/16/23

## 2023-03-16 NOTE — Therapy (Signed)
OUTPATIENT PHYSICAL THERAPY BREAST CANCER BASELINE EVALUATION   Patient Name: Heidi Mitchell MRN: [id number] DOB:1951-05-08, 72 y.o., female Today's Date: 03/16/2023  END OF SESSION:  PT End of Session - 03/16/23 1513     Visit Number 1    Number of Visits 2    Date for PT Re-Evaluation 09/16/23    PT Start Time 1413    PT Stop Time 1445    PT Time Calculation (min) 32 min    Activity Tolerance Patient tolerated treatment well    Behavior During Therapy [facility] for tasks assessed/performed             Past Medical History:  Diagnosis Date   Hyperlipidemia    Intracervical pessary    Unspecified disorder of bladder    Uterine prolapse    Past Surgical History:  Procedure Laterality Date   APPENDECTOMY  1990   bartholins cyst  1978   BREAST BIOPSY Left    benign   BREAST BIOPSY Right 03/04/2023   [name] RT BREAST BX W LOC DEV 1ST LESION IMG BX SPEC US GUIDE 03/04/2023 [facility] MAMMOGRAPHY   COLONOSCOPY WITH PROPOFOL N/A 09/27/2018   Procedure: COLONOSCOPY WITH PROPOFOL;  Surgeon: Wyline Mood, MD;  Location: [facility] ENDOSCOPY;  Service: Gastroenterology;  Laterality: N/A;   Patient Active Problem List   Diagnosis Date Noted   Malignant neoplasm of upper-outer quadrant of right breast in female, estrogen receptor positive (HCC) 03/14/2023   Acute cystitis without hematuria 01/31/2023   Breast mass, right 01/31/2023   Acute non-recurrent frontal sinusitis 10/06/2022   Benign positional vertigo 10/06/2022   Esophageal dysphagia 08/10/2021   Hyperlipidemia, unspecified 07/29/2017   REFERRING PROVIDER: Dr. [name]  REFERRING DIAG: Right breast cancer  THERAPY DIAG:  Malignant neoplasm of upper-outer quadrant of right breast in female, estrogen receptor positive (HCC)  Abnormal posture  Rationale for Evaluation and Treatment: Rehabilitation  ONSET DATE: 02/28/2023  SUBJECTIVE:                                                                                                                                                                                            SUBJECTIVE STATEMENT: Patient reports she is here today to be seen by her medical team for her newly diagnosed right breast cancer.   PERTINENT HISTORY:  Patient was diagnosed on 02/28/2023 with right grade 2 invasive ductal carcinoma breast cancer. It measures 1.7 cm and is located in the upper outer quadrant. It is weakly ER positive, PR negative and HER2 negative with a Ki67 of 80%. She has a positive axillary lymph node.  PATIENT GOALS:   reduce lymphedema  risk and learn post op HEP.   PAIN:  Are you having pain? No  PRECAUTIONS: Active CA   HAND DOMINANCE: right  WEIGHT BEARING RESTRICTIONS: No  FALLS:  Has patient fallen in last 6 months? No  LIVING ENVIRONMENT: Patient lives with: her husband who has had a stroke and she is the caregiver for Lives in: House/apartment Has following equipment at home: None  OCCUPATION: Retired  LEISURE: She walks her dog  PRIOR LEVEL OF FUNCTION: Independent   OBJECTIVE:  COGNITION: Overall cognitive status: Within functional limits for tasks assessed    POSTURE:  Forward head and rounded shoulders posture  UPPER EXTREMITY AROM/PROM:  A/PROM RIGHT   eval   Shoulder extension 57  Shoulder flexion 133  Shoulder abduction 145  Shoulder internal rotation 62  Shoulder external rotation 88    (Blank rows = not tested)  A/PROM LEFT   eval  Shoulder extension 47  Shoulder flexion 127  Shoulder abduction 134  Shoulder internal rotation 52  Shoulder external rotation 86    (Blank rows = not tested)  CERVICAL AROM: All within normal limits  UPPER EXTREMITY STRENGTH: WNL  LYMPHEDEMA ASSESSMENTS:   LANDMARK RIGHT   eval  10 cm proximal to olecranon process 24.4  Olecranon process 21.2  10 cm proximal to ulnar styloid process 18.5  Just proximal to ulnar styloid process 13.4  Across hand at thumb web space 15.4  At base of  2nd digit 5.4  (Blank rows = not tested)  LANDMARK LEFT   eval  10 cm proximal to olecranon process 24.2  Olecranon process 21.2  10 cm proximal to ulnar styloid process 17  Just proximal to ulnar styloid process 13  Across hand at thumb web space 14.2  At base of 2nd digit 5.2  (Blank rows = not tested)  L-DEX LYMPHEDEMA SCREENING:  The patient was assessed using the L-Dex machine today to produce a lymphedema index baseline score. The patient will be reassessed on a regular basis (typically every 3 months) to obtain new L-Dex scores. If the score is > 6.5 points away from his/her baseline score indicating onset of subclinical lymphedema, it will be recommended to wear a compression garment for 4 weeks, 12 hours per day and then be reassessed. If the score continues to be > 6.5 points from baseline at reassessment, we will initiate lymphedema treatment. Assessing in this manner has a 95% rate of preventing clinically significant lymphedema.   L-DEX FLOWSHEETS - 03/16/23 1500       L-DEX LYMPHEDEMA SCREENING   Measurement Type Unilateral    L-DEX MEASUREMENT EXTREMITY Upper Extremity    POSITION  Standing    DOMINANT SIDE Right    At Risk Side Right    BASELINE SCORE (UNILATERAL) 6.8             QUICK DASH SURVEY:  Neldon Mc - 03/16/23 0001     Open a tight or new jar Mild difficulty    Do heavy household chores (wash walls, wash floors) Mild difficulty    Carry a shopping bag or briefcase Mild difficulty    Wash your back No difficulty    Use a knife to cut food No difficulty    Recreational activities in which you take some force or impact through your arm, shoulder, or hand (golf, hammering, tennis) No difficulty    During the past week, to what extent has your arm, shoulder or hand problem interfered with your normal social activities with  family, friends, neighbors, or groups? Not at all    During the past week, to what extent has your arm, shoulder or hand problem  limited your work or other regular daily activities Not at all    Arm, shoulder, or hand pain. None    Tingling (pins and needles) in your arm, shoulder, or hand None    Difficulty Sleeping No difficulty    DASH Score 6.82 %              PATIENT EDUCATION:  Education details: Lymphedema risk reduction and post op shoulder/posture HEP Person educated: Patient Education method: Explanation, Demonstration, Handout Education comprehension: Patient verbalized understanding and returned demonstration  HOME EXERCISE PROGRAM: Patient was instructed today in a home exercise program today for post op shoulder range of motion. These included active assist shoulder flexion in sitting, scapular retraction, wall walking with shoulder abduction, and hands behind head external rotation.  She was encouraged to do these twice a day, holding 3 seconds and repeating 5 times when permitted by her physician.   ASSESSMENT:  CLINICAL IMPRESSION: Patient was diagnosed on 02/28/2023 with right grade 2 invasive ductal carcinoma breast cancer. It measures 1.7 cm and is located in the upper outer quadrant. It is weakly ER positive, PR negative and HER2 negative with a Ki67 of 80%. She has a positive axillary lymph node. Her multidisciplinary medical team met prior to her assessments to determine a recommended treatment plan. She is planning to have neoadjuvant chemotherapy followed by a right lumpectomy and targeted axillary node dissection, radiation, and anti-estrogen therapy. She will benefit from a post op PT reassessment to determine needs and from L-Dex screens every 3 months for 2 years to detect subclinical lymphedema.  Pt will benefit from skilled therapeutic intervention to improve on the following deficits: Decreased knowledge of precautions, impaired UE functional use, pain, decreased ROM, postural dysfunction.   PT treatment/interventions: ADL/self-care home management, pt/family education, therapeutic  exercise  REHAB POTENTIAL: Excellent  CLINICAL DECISION MAKING: Stable/uncomplicated  EVALUATION COMPLEXITY: Low   GOALS: Goals reviewed with patient? YES  LONG TERM GOALS: (STG=LTG)    Name Target Date Goal status  1 Pt will be able to verbalize understanding of pertinent lymphedema risk reduction practices relevant to her dx specifically related to skin care.  Baseline:  No knowledge 03/16/2023 Achieved at eval  2 Pt will be able to return demo and/or verbalize understanding of the post op HEP related to regaining shoulder ROM. Baseline:  No knowledge 03/16/2023 Achieved at eval  3 Pt will be able to verbalize understanding of the importance of attending the post op After Breast CA Class for further lymphedema risk reduction education and therapeutic exercise.  Baseline:  No knowledge 03/16/2023 Achieved at eval  4 Pt will demo she has regained full shoulder ROM and function post operatively compared to baselines.  Baseline: See objective measurements taken today. 09/16/2023     PLAN:  PT FREQUENCY/DURATION: EVAL and 1 follow up appointment.   PLAN FOR NEXT SESSION: will reassess 3-4 weeks post op to determine needs.   Patient will follow up at outpatient cancer rehab 3-4 weeks following surgery.  If the patient requires physical therapy at that time, a specific plan will be dictated and sent to the referring physician for approval. The patient was educated today on appropriate basic range of motion exercises to begin post operatively and the importance of attending the After Breast Cancer class following surgery.  Patient was educated today on lymphedema risk  reduction practices as it pertains to recommendations that will benefit the patient immediately following surgery.  She verbalized good understanding.    Physical Therapy Information for After Breast Cancer Surgery/Treatment:  Lymphedema is a swelling condition that you may be at risk for in your arm if you have lymph nodes  removed from the armpit area.  After a sentinel node biopsy, the risk is approximately 5-9% and is higher after an axillary node dissection.  There is treatment available for this condition and it is not life-threatening.  Contact your physician or physical therapist with concerns. You may begin the 4 shoulder/posture exercises (see additional sheet) when permitted by your physician (typically a week after surgery).  If you have drains, you may need to wait until those are removed before beginning range of motion exercises.  A general recommendation is to not lift your arms above shoulder height until drains are removed.  These exercises should be done to your tolerance and gently.  This is not a "no pain/no gain" type of recovery so listen to your body and stretch into the range of motion that you can tolerate, stopping if you have pain.  If you are having immediate reconstruction, ask your plastic surgeon about doing exercises as he or she may want you to wait. We encourage you to attend the free one time ABC (After Breast Cancer) class offered by [facility].  You will learn information related to lymphedema risk, prevention and treatment and additional exercises to regain mobility following surgery.  You can call [phone] for more information.  This is offered the 1st and 3rd Monday of each month.  You only attend the class one time. While undergoing any medical procedure or treatment, try to avoid blood pressure being taken or needle sticks from occurring on the arm on the side of cancer.   This recommendation begins after surgery and continues for the rest of your life.  This may help reduce your risk of getting lymphedema (swelling in your arm). An excellent resource for those seeking information on lymphedema is the National Lymphedema Network's web site. It can be accessed at www.lymphnet.org If you notice swelling in your hand, arm or breast at any time following surgery  (even if it is many years from now), please contact your doctor or physical therapist to discuss this.  Lymphedema can be treated at any time but it is easier for you if it is treated early on.  If you feel like your shoulder motion is not returning to normal in a reasonable amount of time, please contact your surgeon or physical therapist.  [facility] [phone]. [address], [address], [place] [address]  ABC CLASS After Breast Cancer Class  After Breast Cancer Class is a specially designed exercise class to assist you in a safe recover after having breast cancer surgery.  In this class you will learn how to get back to full function whether your drains were just removed or if you had surgery a month ago.  This one-time class is held the 1st and 3rd Monday of every month from 11:00 a.m. until 12:00 noon virtually.  This class is FREE and space is limited. For more information or to register for the next available class, call [phone].  Class Goals  Understand specific stretches to improve the flexibility of you chest and shoulder. Learn ways to safely strengthen your upper body and improve your posture. Understand the warning signs of  infection and why you may be at risk for an arm infection. Learn about Lymphedema and prevention.  ** You do not attend this class until after surgery.  Drains must be removed to participate  Patient was instructed today in a home exercise program today for post op shoulder range of motion. These included active assist shoulder flexion in sitting, scapular retraction, wall walking with shoulder abduction, and hands behind head external rotation.  She was encouraged to do these twice a day, holding 3 seconds and repeating 5 times when permitted by her physician.  Bethann Punches, [place] 03/16/23 5:03 PM

## 2023-03-17 ENCOUNTER — Other Ambulatory Visit: Payer: Self-pay

## 2023-03-18 NOTE — Progress Notes (Signed)
Pharmacist Chemotherapy Monitoring - Initial Assessment    Anticipated start date: 03/25/23   The following has been reviewed per standard work regarding the patient's treatment regimen: The patient's diagnosis, treatment plan and drug doses, and organ/hematologic function Lab orders and baseline tests specific to treatment regimen  The treatment plan start date, drug sequencing, and pre-medications Prior authorization status  Patient's documented medication list, including drug-drug interaction screen and prescriptions for anti-emetics and supportive care specific to the treatment regimen The drug concentrations, fluid compatibility, administration routes, and timing of the medications to be used The patient's access for treatment and lifetime cumulative dose history, if applicable  The patient's medication allergies and previous infusion related reactions, if applicable   Changes made to treatment plan:  N/A  Follow up needed:  Pending authorization for treatment    Janeice Robinson, RPH, 03/18/2023  2:30 PM

## 2023-03-21 ENCOUNTER — Telehealth: Payer: Self-pay

## 2023-03-21 ENCOUNTER — Encounter: Payer: Self-pay | Admitting: *Deleted

## 2023-03-21 ENCOUNTER — Telehealth: Payer: Self-pay | Admitting: *Deleted

## 2023-03-21 DIAGNOSIS — Z170 Estrogen receptor positive status [ER+]: Secondary | ICD-10-CM

## 2023-03-21 NOTE — Telephone Encounter (Signed)
Spoke with patient to follow up from [facility] and assess navigation needs. Dr. Magnus Ivan isn't able to place port prior to her chemo starting on 7/12. I placed orders for IR to place port.  Appt for 7/11 and patient is aware.  Encouraged her to call should she have any questions or concerns. Patient verbalized understanding.

## 2023-03-21 NOTE — Telephone Encounter (Signed)
Pt called in regards to appts and to let us know that she has not been scheduled for [facility] placement yet, Advised pt I would speak with the nurse navigators to find out where we are in the process. She verbalized thanks and understanding.   Deanna Artis, RN states Dr Magnus Ivan is not able to place [facility]; therefore, the pt will need PAC placement in IR. Deanna Artis will call to set this up and let pt know.

## 2023-03-22 ENCOUNTER — Telehealth: Payer: Self-pay

## 2023-03-22 DIAGNOSIS — Z170 Estrogen receptor positive status [ER+]: Secondary | ICD-10-CM

## 2023-03-22 NOTE — Telephone Encounter (Signed)
[employer] 2021-05 - Specimen Collection Study to Evaluate Biomarkers in Subjects with Cancer   Called the patient to confirm study interest. The stated that they have not had enough to time to read over study material and come to a decision. I will contact the patient later this week to follow up with the patient.   Felecia Jan, [facility] 03/22/2023 2:26 PM

## 2023-03-23 ENCOUNTER — Inpatient Hospital Stay: Payer: Medicare HMO | Admitting: Pharmacist

## 2023-03-23 ENCOUNTER — Other Ambulatory Visit: Payer: Self-pay | Admitting: Physician Assistant

## 2023-03-23 ENCOUNTER — Encounter: Payer: Self-pay | Admitting: General Practice

## 2023-03-23 ENCOUNTER — Inpatient Hospital Stay: Payer: Medicare HMO

## 2023-03-23 ENCOUNTER — Other Ambulatory Visit: Payer: Self-pay

## 2023-03-23 DIAGNOSIS — Z79899 Other long term (current) drug therapy: Secondary | ICD-10-CM | POA: Diagnosis not present

## 2023-03-23 DIAGNOSIS — R5383 Other fatigue: Secondary | ICD-10-CM | POA: Diagnosis not present

## 2023-03-23 DIAGNOSIS — R439 Unspecified disturbances of smell and taste: Secondary | ICD-10-CM | POA: Diagnosis not present

## 2023-03-23 DIAGNOSIS — Z5111 Encounter for antineoplastic chemotherapy: Secondary | ICD-10-CM | POA: Diagnosis not present

## 2023-03-23 DIAGNOSIS — C50411 Malignant neoplasm of upper-outer quadrant of right female breast: Secondary | ICD-10-CM | POA: Diagnosis not present

## 2023-03-23 DIAGNOSIS — Z170 Estrogen receptor positive status [ER+]: Secondary | ICD-10-CM

## 2023-03-23 DIAGNOSIS — E785 Hyperlipidemia, unspecified: Secondary | ICD-10-CM | POA: Diagnosis not present

## 2023-03-23 DIAGNOSIS — R21 Rash and other nonspecific skin eruption: Secondary | ICD-10-CM | POA: Diagnosis not present

## 2023-03-23 DIAGNOSIS — D709 Neutropenia, unspecified: Secondary | ICD-10-CM | POA: Diagnosis not present

## 2023-03-23 NOTE — Progress Notes (Signed)
[facility] Multidisciplinary Clinic Spiritual Care Note  Met with Heidi Mitchell in Breast Multidisciplinary Clinic to introduce Support Center team/resources.  she completed SDOH screening; results follow below.  SDOH Interventions    Flowsheet Row Spiritual Care from 03/23/2023 in [facility] at [facility] Clinical Support from 08/31/2022 in [facility] at [facility] Clinical Support from 08/22/2020 in [facility] Clinical Support from 07[facility]17/2019 in [facility] [place] [facility] at [facility] Clinical Support from 06/01/2017 in [facility] [place] [facility] at [place]  SDOH Interventions       Food Insecurity Interventions Intervention Not Indicated Intervention Not Indicated -- -- --  Housing Interventions Intervention Not Indicated -- -- -- --  Transportation Interventions Intervention Not Indicated Intervention Not Indicated -- -- --  Utilities Interventions Intervention Not Indicated -- -- -- --  Depression Interventions/Treatment  -- -- DGL8-7 Score <4 Follow-up Not Indicated PHQ2-9 Score <4 Follow-up Not Indicated --  [referral to PCP for further evaluation]  Financial Strain Interventions -- Intervention Not Indicated -- -- --  Physical Activity Interventions -- Other (Comments) -- -- --  Stress Interventions -- Intervention Not Indicated -- -- --       SDOH Screenings   Food Insecurity: No Food Insecurity (03/23/2023)  Housing: Low Risk  (03/23/2023)  Transportation Needs: No Transportation Needs (03/23/2023)  Utilities: Not At Risk (03/23/2023)  Alcohol Screen: Low Risk  (08/27/2021)  Depression (PHQ2-9): Low Risk  (08/31/2022)  Financial Resource Strain: Low Risk  (08/31/2022)  Physical Activity: Insufficiently Active (08/31/2022)  Social Connections: Moderately Integrated (08/27/2021)  Stress: No Stress Concern Present (08/31/2022)  Tobacco Use: Medium Risk (03/16/2023)    Chaplain and patient discussed  common feelings and emotions when being diagnosed with cancer, and the importance of support during treatment.  Chaplain informed patient of the support team and support services at [facility].  Chaplain provided contact information and encouraged patient to call with any questions or concerns.  [name] reports good support from family, friends, and church. Prayer and faith are key coping and meaning-making tools for her.  Contacted Nurse Navigator re [name]'s wig rx request and Research re her interest in participating in study and having blood draw when she is already on campus.  Follow up needed: Yes.  We plan to meet in person at her first treatment on Friday.   [address] [place], [place], [facility] Pager [phone] Voicemail [phone]

## 2023-03-23 NOTE — Progress Notes (Signed)
[facility]       Telephone: [phone]?Fax: [phone]   Oncology Clinical Pharmacist Practitioner Initial Assessment  Heidi Mitchell is a 72 y.o. female with a diagnosis of breast cancer. They were contacted today via in-person visit. She is accompanied by her friend Vickie.  Indication/Regimen Docetaxel (Taxotere) and cyclophosphamide (Cytoxan) are being used appropriately for treatment of breast cancer by Dr. Serena Croissant.      Wt Readings from Last 1 Encounters:  03/16/23 115 lb 8 oz (52.4 kg)    Estimated body surface area is 1.49 meters squared as calculated from the following:   Height as of 03/16/23: 5' 0.25" (1.53 m).   Weight as of 03/16/23: 115 lb 8 oz (52.4 kg).  The dosing regimen cycle is every 21 days x 4 cycles  Docetaxel (75 mg/m2) on Day 1 Cyclophosphamide (600 mg/m2) on Day 1 Pegfilgrastim (6 mg) on Day 3  It is planned to continue until treatment plan completion or unacceptable toxicity. The tentative start date is: 03/25/23  Dose Modifications No chemotherapy dose reductions at this time per Dr. Pamelia Hoit   Allergies Allergies  Allergen Reactions   Sertraline Hcl     Muscle spasms    Vitals: No vitals or labs were done today for this chemotherapy education visit     03/16/2023   12:43 PM 01/31/2023    2:32 PM 10/06/2022    8:50 AM  Oncology Vitals  Height 153 cm 153 cm 153 cm  Weight 52.39 kg 51.71 kg 53.978 kg  Weight (lbs) 115 lbs 8 oz 114 lbs 119 lbs  BMI 22.37 kg/m2   22.37 kg/m2 22.08 kg/m2   22.08 kg/m2 23.05 kg/m2   23.05 kg/m2  Temp 98.1 F (36.7 C) 97.3 F (36.3 C) 97.6 F (36.4 C)  Pulse Rate 64 54 58  BP 130/61 124/78 112/72  Resp 18    SpO2 96 % 97 % 99 %  BSA (m2) 1.49 m2   1.49 m2 1.48 m2   1.48 m2 1.52 m2   1.52 m2     Laboratory Data    Latest Ref Rng & Units 03/16/2023   11:37 AM 06/03/2022    9:20 AM 09/24/2020   10:39 AM  CBC EXTENDED  WBC 4.0 - 10.5 K/uL 7.1  6.9  7.2   RBC 3.87 - 5.11 MIL/uL  4.45  4.65  4.31   Hemoglobin 12.0 - 15.0 g/dL 65.7  84.6  96.2   HCT 36.0 - 46.0 % 40.3  40.1  39.1   Platelets 150 - 400 K/uL 251  213.0  180   NEUT# 1.7 - 7.7 K/uL 4.5     Lymph# 0.7 - 4.0 K/uL 1.7          Latest Ref Rng & Units 03/16/2023   11:37 AM 06/03/2022    9:20 AM 11/04/2021   11:31 AM  CMP  Glucose 70 - 99 mg/dL 94  87  95   BUN 8 - 23 mg/dL 11  15  14    Creatinine 0.44 - 1.00 mg/dL 9.52  8.41  3.24   Sodium 135 - 145 mmol/L 142  137  138   Potassium 3.5 - 5.1 mmol/L 4.7  4.1  4.0   Chloride 98 - 111 mmol/L 108  103  104   CO2 22 - 32 mmol/L 29  26  31    Calcium 8.9 - 10.3 mg/dL 9.6  9.6  9.3   Total Protein 6.5 - 8.1  g/dL 6.4  6.8  6.9   Total Bilirubin 0.3 - 1.2 mg/dL 1.1  1.8  1.4   Alkaline Phos 38 - 126 U/L 63  61  66   AST 15 - 41 U/L 17  17  21    ALT 0 - 44 U/L 13  11  14     Contraindications Contraindications were reviewed? Yes Contraindications to therapy were identified? No   Safety Precautions The following safety precautions were reviewed:  Fever: reviewed the importance of having a thermometer and the Centers for Disease Control and Prevention (CDC) definition of fever which is 100.71F (38C) or higher. Patient should call 24/7 triage at [phone] if experiencing a fever or any other symptoms Decreased white blood cells (WBCs) and increased risk for infection Decreased platelet count and increased risk of bleeding Decreased hemoglobin, part of the red blood cells that carry iron and oxygen Hair Loss Fatigue Fluid retention or swelling (edema) Peripheral Neuropathy Mouth sores Rash or itchy skin Muscle or joint pain or weakness Nausea or vomiting Diarrhea Nail Changes Hypersensitivity reactions Secondary Malignancies Hemorrhagic cystitis Pneumonitis Handling body fluids and waste Intimacy, sexual activity, contraception, fertility  Medication Reconciliation Current Outpatient Medications  Medication Sig Dispense Refill   estradiol  (ESTRACE) 0.1 MG/GM vaginal cream Place 1 Applicatorful vaginally at bedtime.     atorvastatin (LIPITOR) 10 MG tablet Take 1 tablet (10 mg total) by mouth daily. (Patient not taking: Reported on 03/23/2023) 90 tablet 1   dexamethasone (DECADRON) 4 MG tablet Take 1 tablet day before chemo and 1 tablet day after chemo with food (Patient not taking: Reported on 03/23/2023) 8 tablet 0   lidocaine-prilocaine (EMLA) cream Apply to affected area once (Patient not taking: Reported on 03/23/2023) 30 g 3   omeprazole (PRILOSEC) 20 MG capsule Take 1 capsule (20 mg total) by mouth every other day. On an empty stomach (Patient not taking: Reported on 03/23/2023) 45 capsule 1   ondansetron (ZOFRAN) 8 MG tablet Take 1 tablet (8 mg total) by mouth every 8 (eight) hours as needed for nausea or vomiting. Start on the third day after chemotherapy. (Patient not taking: Reported on 03/23/2023) 30 tablet 1   prochlorperazine (COMPAZINE) 10 MG tablet Take 1 tablet (10 mg total) by mouth every 6 (six) hours as needed for nausea or vomiting. (Patient not taking: Reported on 03/23/2023) 30 tablet 1   sodium fluoride (PREVIDENT 5000 PLUS) 1.1 % CREA dental cream Take by mouth. (Patient not taking: Reported on 03/16/2023)     No current facility-administered medications for this visit.   Medication reconciliation is based on the patient's most recent medication list in the electronic medical record (EMR) including herbal products and OTC medications.   The patient's medication list was reviewed today with the patient? Yes   Drug-drug interactions (DDIs) DDIs were evaluated? Yes Significant DDIs identified? No   Drug-Food Interactions Drug-food interactions were evaluated? Yes Drug-food interactions identified? No   Follow-up Plan  Treatment start date: 03/24/23 Port placement date: 03/25/23 We reviewed the prescriptions, premedications, and treatment regimen with the patient. Possible side effects of the treatment regimen were  reviewed and management strategies were discussed.  Can use loperamide as needed for diarrhea, loratadine as needed for G-CSF bone pain, and Senna-S as needed for constipation.  Clinical pharmacy will assist Dr. Serena Croissant and Jonette Pesa on an as needed basis going forward  Jonette Pesa participated in the discussion, expressed understanding, and voiced agreement with the above plan. All  questions were answered to her satisfaction. The patient was advised to contact the clinic at [phone] with any questions or concerns prior to her return visit.   I spent 60 minutes assessing the patient.  Jolly Bleicher A. Odetta Pink, PharmD, BCOP, CPP  Anselm Lis, RPH-CPP, 03/23/2023 4:52 PM  **Disclaimer: This note was dictated with voice recognition software. Similar sounding words can inadvertently be transcribed and this note may contain transcription errors which may not have been corrected upon publication of note.**

## 2023-03-24 ENCOUNTER — Other Ambulatory Visit: Payer: Self-pay

## 2023-03-24 ENCOUNTER — Other Ambulatory Visit: Payer: Self-pay | Admitting: Hematology and Oncology

## 2023-03-24 ENCOUNTER — Encounter: Payer: Self-pay | Admitting: Hematology and Oncology

## 2023-03-24 ENCOUNTER — Ambulatory Visit (HOSPITAL_COMMUNITY)
Admission: RE | Admit: 2023-03-24 | Discharge: 2023-03-24 | Disposition: A | Discharge status: Home / Self Care | Payer: Medicare HMO | Source: Ambulatory Visit | Attending: Hematology and Oncology | Admitting: Hematology and Oncology

## 2023-03-24 ENCOUNTER — Telehealth: Payer: Self-pay

## 2023-03-24 ENCOUNTER — Ambulatory Visit
Admission: RE | Admit: 2023-03-24 | Discharge: 2023-03-24 | Disposition: A | Discharge status: Home / Self Care | Payer: Medicare HMO | Source: Ambulatory Visit | Attending: Hematology and Oncology | Admitting: Hematology and Oncology

## 2023-03-24 DIAGNOSIS — Z8049 Family history of malignant neoplasm of other genital organs: Secondary | ICD-10-CM | POA: Insufficient documentation

## 2023-03-24 DIAGNOSIS — Z7722 Contact with and (suspected) exposure to environmental tobacco smoke (acute) (chronic): Secondary | ICD-10-CM | POA: Insufficient documentation

## 2023-03-24 DIAGNOSIS — C50411 Malignant neoplasm of upper-outer quadrant of right female breast: Secondary | ICD-10-CM

## 2023-03-24 DIAGNOSIS — Z170 Estrogen receptor positive status [ER+]: Secondary | ICD-10-CM | POA: Insufficient documentation

## 2023-03-24 DIAGNOSIS — Z452 Encounter for adjustment and management of vascular access device: Secondary | ICD-10-CM | POA: Diagnosis not present

## 2023-03-24 DIAGNOSIS — N63 Unspecified lump in unspecified breast: Secondary | ICD-10-CM | POA: Diagnosis not present

## 2023-03-24 DIAGNOSIS — Z803 Family history of malignant neoplasm of breast: Secondary | ICD-10-CM | POA: Insufficient documentation

## 2023-03-24 HISTORY — PX: IR IMAGING GUIDED PORT INSERTION: IMG5740

## 2023-03-24 MED ORDER — HEPARIN SOD (PORK) LOCK FLUSH 100 UNIT/ML IV SOLN
INTRAVENOUS | Status: AC
  Filled 2023-03-24: qty 5

## 2023-03-24 MED ORDER — HEPARIN SOD (PORK) LOCK FLUSH 100 UNIT/ML IV SOLN
500.0000 [IU] | Freq: Once | INTRAVENOUS | Status: AC
  Administered 2023-03-24: 500 [IU] via INTRAVENOUS

## 2023-03-24 MED ORDER — FENTANYL CITRATE (PF) 100 MCG/2ML IJ SOLN
INTRAMUSCULAR | Status: AC | PRN
  Administered 2023-03-24 (×2): 25 ug via INTRAVENOUS

## 2023-03-24 MED ORDER — FENTANYL CITRATE (PF) 100 MCG/2ML IJ SOLN
INTRAMUSCULAR | Status: AC
  Filled 2023-03-24: qty 2

## 2023-03-24 MED ORDER — LIDOCAINE HCL 1 % IJ SOLN
INTRAMUSCULAR | Status: AC
  Filled 2023-03-24: qty 20

## 2023-03-24 MED ORDER — GADOPICLENOL 0.5 MMOL/ML IV SOLN
5.0000 mL | Freq: Once | INTRAVENOUS | Status: AC | PRN
  Administered 2023-03-24: 5 mL via INTRAVENOUS

## 2023-03-24 MED ORDER — MIDAZOLAM HCL 2 MG/2ML IJ SOLN
INTRAMUSCULAR | Status: AC
  Filled 2023-03-24: qty 2

## 2023-03-24 MED ORDER — SODIUM CHLORIDE 0.9 % IV SOLN
INTRAVENOUS | Status: AC | PRN
  Administered 2023-03-24: 10 mL/h via INTRAVENOUS

## 2023-03-24 MED ORDER — LIDOCAINE HCL (PF) 1 % IJ SOLN
10.0000 mL | Freq: Once | INTRAMUSCULAR | Status: AC
  Administered 2023-03-24: 10 mL

## 2023-03-24 MED ORDER — MIDAZOLAM HCL 2 MG/2ML IJ SOLN
INTRAMUSCULAR | Status: AC | PRN
  Administered 2023-03-24 (×2): .5 mg via INTRAVENOUS

## 2023-03-24 MED FILL — Dexamethasone Sodium Phosphate Inj 100 MG/10ML: INTRAMUSCULAR | Qty: 1 | Status: AC

## 2023-03-24 NOTE — Progress Notes (Signed)
Called pt to introduce myself as her Financial Resource Specialist and to discuss the [employer] grant.  I left a msg requesting she return my call if she's interested in applying for the grant.  

## 2023-03-24 NOTE — Procedures (Signed)
  Procedure:  L internal jugular port catheter placement   Preprocedure diagnosis: The encounter diagnosis was Malignant neoplasm of upper-outer quadrant of right breast in female, estrogen receptor positive (HCC). Postprocedure diagnosis: same EBL:    minimal Complications:   none immediate  See full dictation in [employer].  Thora Lance MD Main # [phone] Pager  [phone] Mobile [phone]

## 2023-03-24 NOTE — Telephone Encounter (Signed)
[employer] 2021-05 - Specimen Collection Study to Evaluate Biomarkers in Subjects with Cancer   Called the patient back to see if they would like to participate in the exact study. The agreed to participation and will becoming this Friday, 03/25/2023, at 8:30 to complete study procedures.   Heidi Mitchell, [facility] 03/24/2023 10:25 AM

## 2023-03-24 NOTE — Telephone Encounter (Signed)
Returned Pt's call regarding compression socks and Claritin use. Pt asks if 10/20 compression socks that are 1 size above Pt's normal size are ok. Pt also asks when she should start taking the Claritin. Advised Pt that [employer], pharmacies, and [place], are good places to find 20/30 compression socks but to use what she is able to find. Instructed Pt to start taking Claritin on the day of her Udenyca injection (next is 03/28/23) and take for 7 days to help in boosting WBC count. Pt verbalized understanding.

## 2023-03-24 NOTE — H&P (Signed)
Chief Complaint: Breast Cancer  Referring Provider(s): Gudena  Supervising Physician: Irish Lack  Patient Status: [facility] - Out-pt  History of Present Illness: Heidi Mitchell is a 72 y.o. female who noticed a lump in the right breast.  It measured 1.7 cm by ultrasound and there were also 3 abnormal lymph nodes.  She underwent biopsy= invasive ductal carcinoma with medullary features , mets to axillary lymph node.  She is here today for placement of a Port A Cath.  Patient is Full Code  Past Medical History:  Diagnosis Date   Hyperlipidemia    Intracervical pessary    Unspecified disorder of bladder    Uterine prolapse     Past Surgical History:  Procedure Laterality Date   APPENDECTOMY  1990   bartholins cyst  1978   BREAST BIOPSY Left    benign   BREAST BIOPSY Right 03/04/2023   [name] RT BREAST BX W LOC DEV 1ST LESION IMG BX SPEC US GUIDE 03/04/2023 [facility] MAMMOGRAPHY   COLONOSCOPY WITH PROPOFOL N/A 09/27/2018   Procedure: COLONOSCOPY WITH PROPOFOL;  Surgeon: Wyline Mood, MD;  Location: [facility] ENDOSCOPY;  Service: Gastroenterology;  Laterality: N/A;    Allergies: Sertraline hcl  Medications: Prior to Admission medications   Medication Sig Start Date End Date Taking? Authorizing Provider  atorvastatin (LIPITOR) 10 MG tablet Take 1 tablet (10 mg total) by mouth daily. Patient not taking: Reported on 03/23/2023 11/04/21   Gweneth Dimitri, MD  dexamethasone (DECADRON) 4 MG tablet Take 1 tablet day before chemo and 1 tablet day after chemo with food Patient not taking: Reported on 03/23/2023 03/16/23   Serena Croissant, MD  estradiol (ESTRACE) 0.1 MG/GM vaginal cream Place 1 Applicatorful vaginally at bedtime.    [provider]  lidocaine-prilocaine (EMLA) cream Apply to affected area once Patient not taking: Reported on 03/23/2023 03/16/23   Serena Croissant, MD  omeprazole (PRILOSEC) 20 MG capsule Take 1 capsule (20 mg total) by mouth every other day. On an empty  stomach Patient not taking: Reported on 03/23/2023 01/04/23   Karie Schwalbe, MD  ondansetron (ZOFRAN) 8 MG tablet Take 1 tablet (8 mg total) by mouth every 8 (eight) hours as needed for nausea or vomiting. Start on the third day after chemotherapy. Patient not taking: Reported on 03/23/2023 03/16/23   Serena Croissant, MD  prochlorperazine (COMPAZINE) 10 MG tablet Take 1 tablet (10 mg total) by mouth every 6 (six) hours as needed for nausea or vomiting. Patient not taking: Reported on 03/23/2023 03/16/23   Serena Croissant, MD  sodium fluoride (PREVIDENT 5000 PLUS) 1.1 % CREA dental cream Take by mouth. Patient not taking: Reported on 03/16/2023 03/07/22   [provider]     Family History  Problem Relation Age of Onset   Alcohol abuse Mother    Cervical cancer Sister 14   Breast cancer Maternal Aunt        dx 4s   Other Son        hole in his brain due to been dropped when he was born    Social History   Socioeconomic History   Marital status: Married    Spouse name: Mitzie Na   Number of children: 1   Years of education: high school   Highest education level: Not on file  Occupational History   Not on file  Tobacco Use   Smoking status: Never    Passive exposure: Current   Smokeless tobacco: Never  Vaping Use   Vaping  status: Never Used  Substance and Sexual Activity   Alcohol use: No   Drug use: No   Sexual activity: Not Currently    Birth control/protection: Abstinence  Other Topics Concern   Not on file  Social History Narrative   Does not have living will   Husband and son aware of wishes.   Pt would desires CPR but would not want prolonged life support if futile.   03/06/19   From: the area   Living: husband, Mitzie Na   Work: retired, but finances OK      Family: Son, Marcial Pacas who lives nearby, 2 grandchildren (40 and 65 yo), and a Physicist, medical on the way      Enjoys: fishing, hiking, camping, outdoors activity      Exercise: walking daily - 1.5 mile  route that she does   Diet: not great - too much junk food, tries to do calcium      Safety   Seat belts: Yes    Guns: Yes - hunting gun, locked   Safe in relationships: Yes    Social Determinants of Health   Financial Resource Strain: Low Risk  (08/31/2022)   Overall Financial Resource Strain (CARDIA)    Difficulty of Paying Living Expenses: Not hard at all  Food Insecurity: No Food Insecurity (03/23/2023)   Hunger Vital Sign    Worried About Running Out of Food in the Last Year: Never true    Ran Out of Food in the Last Year: Never true  Transportation Needs: No Transportation Needs (03/23/2023)   PRAPARE - Administrator, Civil Service (Medical): No    Lack of Transportation (Non-Medical): No  Physical Activity: Insufficiently Active (08/31/2022)   Exercise Vital Sign    Days of Exercise per Week: 7 days    Minutes of Exercise per Session: 20 min  Stress: No Stress Concern Present (08/31/2022)   [employer] of Occupational Health - Occupational Stress Questionnaire    Feeling of Stress : Not at all  Social Connections: Moderately Integrated (08/27/2021)   Social Connection and Isolation Panel [NHANES]    Frequency of Communication with Friends and Family: More than three times a week    Frequency of Social Gatherings with Friends and Family: More than three times a week    Attends Religious Services: 1 to 4 times per year    Active Member of [employer] or Organizations: No    Attends Banker Meetings: Never    Marital Status: Married     Review of Systems: A 12 point ROS discussed and pertinent positives are indicated in the HPI above.  All other systems are negative.  Review of Systems  Vital Signs: BP (!) 122/48   Pulse (!) 57   Temp 99.7 F (37.6 C) (Temporal)   Resp 16   Ht 5' (1.524 m)   Wt 114 lb (51.7 kg)   LMP 11/10/2012 (LMP Unknown)   SpO2 97%   BMI 22.26 kg/m   Advance Care Plan: The advanced care place/surrogate decision  maker was discussed at the time of visit and the patient did not wish to discuss or was not able to name a surrogate decision maker or provide an advance care plan.  Physical Exam Vitals reviewed.  Constitutional:      Appearance: Normal appearance.  HENT:     Head: Normocephalic and atraumatic.  Eyes:     Extraocular Movements: Extraocular movements intact.  Cardiovascular:     Rate and Rhythm: Normal  rate and regular rhythm.  Pulmonary:     Effort: Pulmonary effort is normal. No respiratory distress.     Breath sounds: Normal breath sounds.  Abdominal:     Palpations: Abdomen is soft.  Musculoskeletal:        General: Normal range of motion.     Cervical back: Normal range of motion.  Skin:    General: Skin is warm and dry.  Neurological:     General: No focal deficit present.     Mental Status: She is alert and oriented to person, place, and time.  Psychiatric:        Mood and Affect: Mood normal.        Behavior: Behavior normal.        Thought Content: Thought content normal.        Judgment: Judgment normal.     Labs:  CBC: Recent Labs    06/03/22 0920 03/16/23 1137  WBC 6.9 7.1  HGB 13.1 12.5  HCT 40.1 40.3  PLT 213.0 251    COAGS: No results for input(s): "INR", "APTT" in the last 8760 hours.  BMP: Recent Labs    06/03/22 0920 03/16/23 1137  NA 137 142  K 4.1 4.7  CL 103 108  CO2 26 29  GLUCOSE 87 94  BUN 15 11  CALCIUM 9.6 9.6  CREATININE 0.69 0.73  GFRNONAA  --  >60    LIVER FUNCTION TESTS: Recent Labs    06/03/22 0920 03/16/23 1137  BILITOT 1.8* 1.1  AST 17 17  ALT 11 13  ALKPHOS 61 63  PROT 6.8 6.4*  ALBUMIN 4.2 4.0    TUMOR MARKERS: No results for input(s): "AFPTM", "CEA", "CA199", "CHROMGRNA" in the last 8760 hours.  Assessment and Plan:  Breast Cancer  Will proceed with image guided placement of a tunneled catheter with port today by Dr. Fredia Sorrow.  Risks and benefits of image guided port-a-catheter placement was  discussed with the patient including, but not limited to bleeding, infection, pneumothorax, or fibrin sheath development and need for additional procedures.  All of the patient's questions were answered, patient is agreeable to proceed. Consent signed and in chart.  Thank you for allowing our service to participate in Heidi Mitchell 's care.  Electronically Signed: Gwynneth Macleod, PA-C   03/24/2023, 1:29 PM      I spent a total of  30 Minutes  in face to face in clinical consultation, greater than 50% of which was counseling/coordinating care for port a cath placement.

## 2023-03-25 ENCOUNTER — Other Ambulatory Visit: Payer: Self-pay | Admitting: Hematology and Oncology

## 2023-03-25 ENCOUNTER — Inpatient Hospital Stay: Payer: Medicare HMO

## 2023-03-25 ENCOUNTER — Encounter: Payer: Self-pay | Admitting: Genetic Counselor

## 2023-03-25 ENCOUNTER — Telehealth: Payer: Self-pay

## 2023-03-25 ENCOUNTER — Other Ambulatory Visit: Payer: Self-pay

## 2023-03-25 ENCOUNTER — Telehealth: Payer: Self-pay | Admitting: Genetic Counselor

## 2023-03-25 ENCOUNTER — Encounter: Payer: Self-pay | Admitting: Hematology and Oncology

## 2023-03-25 VITALS — BP 122/53 | HR 50 | Temp 98.1°F | Resp 18

## 2023-03-25 DIAGNOSIS — Z170 Estrogen receptor positive status [ER+]: Secondary | ICD-10-CM

## 2023-03-25 DIAGNOSIS — E785 Hyperlipidemia, unspecified: Secondary | ICD-10-CM | POA: Diagnosis not present

## 2023-03-25 DIAGNOSIS — C50411 Malignant neoplasm of upper-outer quadrant of right female breast: Secondary | ICD-10-CM

## 2023-03-25 DIAGNOSIS — R439 Unspecified disturbances of smell and taste: Secondary | ICD-10-CM | POA: Diagnosis not present

## 2023-03-25 DIAGNOSIS — Z1379 Encounter for other screening for genetic and chromosomal anomalies: Secondary | ICD-10-CM | POA: Insufficient documentation

## 2023-03-25 DIAGNOSIS — Z5111 Encounter for antineoplastic chemotherapy: Secondary | ICD-10-CM | POA: Diagnosis not present

## 2023-03-25 DIAGNOSIS — R5383 Other fatigue: Secondary | ICD-10-CM | POA: Diagnosis not present

## 2023-03-25 DIAGNOSIS — R21 Rash and other nonspecific skin eruption: Secondary | ICD-10-CM | POA: Diagnosis not present

## 2023-03-25 DIAGNOSIS — Z79899 Other long term (current) drug therapy: Secondary | ICD-10-CM | POA: Diagnosis not present

## 2023-03-25 DIAGNOSIS — D709 Neutropenia, unspecified: Secondary | ICD-10-CM | POA: Diagnosis not present

## 2023-03-25 LAB — CBC WITH DIFFERENTIAL (CANCER CENTER ONLY)
Abs Immature Granulocytes: 0.06 10*3/uL (ref 0.00–0.07)
Basophils Absolute: 0 10*3/uL (ref 0.0–0.1)
Basophils Relative: 0 %
Eosinophils Absolute: 0 10*3/uL (ref 0.0–0.5)
Eosinophils Relative: 0 %
HCT: 40.4 % (ref 36.0–46.0)
Hemoglobin: 13 g/dL (ref 12.0–15.0)
Immature Granulocytes: 1 %
Lymphocytes Relative: 15 %
Lymphs Abs: 1.6 10*3/uL (ref 0.7–4.0)
MCH: 28.5 pg (ref 26.0–34.0)
MCHC: 32.2 g/dL (ref 30.0–36.0)
MCV: 88.6 fL (ref 80.0–100.0)
Monocytes Absolute: 1.2 10*3/uL — ABNORMAL HIGH (ref 0.1–1.0)
Monocytes Relative: 11 %
Neutro Abs: 8.1 10*3/uL — ABNORMAL HIGH (ref 1.7–7.7)
Neutrophils Relative %: 73 %
Platelet Count: 260 10*3/uL (ref 150–400)
RBC: 4.56 MIL/uL (ref 3.87–5.11)
RDW: 14.7 % (ref 11.5–15.5)
WBC Count: 11 10*3/uL — ABNORMAL HIGH (ref 4.0–10.5)
nRBC: 0 % (ref 0.0–0.2)

## 2023-03-25 LAB — CMP (CANCER CENTER ONLY)
ALT: 15 U/L (ref 0–44)
AST: 20 U/L (ref 15–41)
Albumin: 4.4 g/dL (ref 3.5–5.0)
Alkaline Phosphatase: 58 U/L (ref 38–126)
Anion gap: 8 (ref 5–15)
BUN: 16 mg/dL (ref 8–23)
CO2: 24 mmol/L (ref 22–32)
Calcium: 9.5 mg/dL (ref 8.9–10.3)
Chloride: 106 mmol/L (ref 98–111)
Creatinine: 0.62 mg/dL (ref 0.44–1.00)
GFR, Estimated: >60 mL/min (ref >60)
Glucose, Bld: 108 mg/dL — ABNORMAL HIGH (ref 70–99)
Potassium: 4.1 mmol/L (ref 3.5–5.1)
Sodium: 138 mmol/L (ref 135–145)
Total Bilirubin: 1.4 mg/dL — ABNORMAL HIGH (ref 0.3–1.2)
Total Protein: 7.2 g/dL (ref 6.5–8.1)

## 2023-03-25 MED ORDER — SODIUM CHLORIDE 0.9% FLUSH
10.0000 mL | INTRAVENOUS | Status: DC | PRN
  Administered 2023-03-25: 10 mL

## 2023-03-25 MED ORDER — SODIUM CHLORIDE 0.9 % IV SOLN
10.0000 mg | Freq: Once | INTRAVENOUS | Status: AC
  Administered 2023-03-25: 10 mg via INTRAVENOUS
  Filled 2023-03-25: qty 10

## 2023-03-25 MED ORDER — SODIUM CHLORIDE 0.9 % IV SOLN: Freq: Once | INTRAVENOUS | Status: AC

## 2023-03-25 MED ORDER — SODIUM CHLORIDE 0.9 % IV SOLN
500.0000 mg/m2 | Freq: Once | INTRAVENOUS | Status: AC
  Administered 2023-03-25: 740 mg via INTRAVENOUS
  Filled 2023-03-25: qty 37

## 2023-03-25 MED ORDER — SODIUM CHLORIDE 0.9 % IV SOLN
65.0000 mg/m2 | Freq: Once | INTRAVENOUS | Status: AC
  Administered 2023-03-25: 97 mg via INTRAVENOUS
  Filled 2023-03-25: qty 9.7

## 2023-03-25 MED ORDER — PALONOSETRON HCL INJECTION 0.25 MG/5ML
0.2500 mg | Freq: Once | INTRAVENOUS | Status: AC
  Administered 2023-03-25: 0.25 mg via INTRAVENOUS
  Filled 2023-03-25: qty 5

## 2023-03-25 MED ORDER — HEPARIN SOD (PORK) LOCK FLUSH 100 UNIT/ML IV SOLN
500.0000 [IU] | Freq: Once | INTRAVENOUS | Status: AC | PRN
  Administered 2023-03-25: 500 [IU]

## 2023-03-25 NOTE — Patient Instructions (Signed)
[facility]  Discharge Instructions: Thank you for choosing [facility] to provide your oncology and hematology care.   If you have a lab appointment with the Cancer Center, please go directly to the Cancer Center and check in at the registration area.   Wear comfortable clothing and clothing appropriate for easy access to any Portacath or PICC line.   We strive to give you quality time with your provider. You may need to reschedule your appointment if you arrive late (15 or more minutes).  Arriving late affects you and other patients whose appointments are after yours.  Also, if you miss three or more appointments without notifying the office, you may be dismissed from the clinic at the provider's discretion.      For prescription refill requests, have your pharmacy contact our office and allow 72 hours for refills to be completed.    Today you received the following chemotherapy and/or immunotherapy agents  Docetaxel (Taxotere) Cyclophosphamide (Cytoxan)   To help prevent nausea and vomiting after your treatment, we encourage you to take your nausea medication as directed.  BELOW ARE SYMPTOMS THAT SHOULD BE REPORTED IMMEDIATELY: *FEVER GREATER THAN 100.4 F (38 C) OR HIGHER *CHILLS OR SWEATING *NAUSEA AND VOMITING THAT IS NOT CONTROLLED WITH YOUR NAUSEA MEDICATION *UNUSUAL SHORTNESS OF BREATH *UNUSUAL BRUISING OR BLEEDING *URINARY PROBLEMS (pain or burning when urinating, or frequent urination) *BOWEL PROBLEMS (unusual diarrhea, constipation, pain near the anus) TENDERNESS IN MOUTH AND THROAT WITH OR WITHOUT PRESENCE OF ULCERS (sore throat, sores in mouth, or a toothache) UNUSUAL RASH, SWELLING OR PAIN  UNUSUAL VAGINAL DISCHARGE OR ITCHING   Items with * indicate a potential emergency and should be followed up as soon as possible or go to the Emergency Department if any problems should occur.  Please show the CHEMOTHERAPY ALERT CARD  or IMMUNOTHERAPY ALERT CARD at check-in to the Emergency Department and triage nurse.  Should you have questions after your visit or need to cancel or reschedule your appointment, please contact [facility]  Dept: [phone]  and follow the prompts.  Office hours are 8:00 a.m. to 4:30 p.m. Monday - Friday. Please note that voicemails left after 4:00 p.m. may not be returned until the following business day.  We are closed weekends and major holidays. You have access to a nurse at all times for urgent questions. Please call the main number to the clinic Dept: [phone] and follow the prompts.   For any non-urgent questions, you may also contact your provider using MyChart. We now offer e-Visits for anyone 6 and older to request care online for non-urgent symptoms. For details visit [email or web address].   Also download the MyChart app! Go to the app store, search "MyChart", open the app, select [facility], and log in with your MyChart username and password.  Docetaxel (Taxotere) Injection What is this medication? DOCETAXEL (doe se TAX el) treats some types of cancer. It works by slowing down the growth of cancer cells. This medicine may be used for other purposes; ask your health care provider or pharmacist if you have questions. COMMON BRAND NAME(S): Docefrez, Taxotere What should I tell my care team before I take this medication? They need to know if you have any of these conditions: Kidney disease Liver disease Low white blood cell levels Tingling of the fingers or toes or other nerve disorder An unusual or allergic reaction to docetaxel, polysorbate 80, other medications, foods,  dyes, or preservatives Pregnant or trying to get pregnant Breast-feeding How should I use this medication? This medication is injected into a vein. It is given by your care team in a hospital or clinic setting. Talk to your care team about the use of this medication  in children. Special care may be needed. Overdosage: If you think you have taken too much of this medicine contact a poison control center or emergency room at once. NOTE: This medicine is only for you. Do not share this medicine with others. What if I miss a dose? Keep appointments for follow-up doses. It is important not to miss your dose. Call your care team if you are unable to keep an appointment. What may interact with this medication? Do not take this medication with any of the following: Live virus vaccines This medication may also interact with the following: Certain antibiotics, such as clarithromycin, telithromycin Certain antivirals for HIV or hepatitis Certain medications for fungal infections, such as itraconazole, ketoconazole, voriconazole Grapefruit juice Nefazodone Supplements, such as St. John's wort This list may not describe all possible interactions. Give your health care provider a list of all the medicines, herbs, non-prescription drugs, or dietary supplements you use. Also tell them if you smoke, drink alcohol, or use illegal drugs. Some items may interact with your medicine. What should I watch for while using this medication? This medication may make you feel generally unwell. This is not uncommon as chemotherapy can affect healthy cells as well as cancer cells. Report any side effects. Continue your course of treatment even though you feel ill unless your care team tells you to stop. You may need blood work done while you are taking this medication. This medication can cause serious side effects and infusion reactions. To reduce the risk, your care team may give you other medications to take before receiving this one. Be sure to follow the directions from your care team. This medication may increase your risk of getting an infection. Call your care team for advice if you get a fever, chills, sore throat, or other symptoms of a cold or flu. Do not treat yourself. Try to  avoid being around people who are sick. Avoid taking medications that contain aspirin, acetaminophen, ibuprofen, naproxen, or ketoprofen unless instructed by your care team. These medications may hide a fever. Be careful brushing or flossing your teeth or using a toothpick because you may get an infection or bleed more easily. If you have any dental work done, tell your dentist you are receiving this medication. Some products may contain alcohol. Ask your care team if this medication contains alcohol. Be sure to tell all care teams you are taking this medicine. Certain medications, like metronidazole and disulfiram, can cause an unpleasant reaction when taken with alcohol. The reaction includes flushing, headache, nausea, vomiting, sweating, and increased thirst. The reaction can last from 30 minutes to several hours. This medication may affect your coordination, reaction time, or judgement. Do not drive or operate machinery until you know how this medication affects you. Sit up or stand slowly to reduce the risk of dizzy or fainting spells. Drinking alcohol with this medication can increase the risk of these side effects. Talk to your care team about your risk of cancer. You may be more at risk for certain types of cancer if you take this medication. Talk to your care team if you wish to become pregnant or think you might be pregnant. This medication can cause serious birth defects if taken  during pregnancy or if you get pregnant within 2 months after stopping therapy. A negative pregnancy test is required before starting this medication. A reliable form of contraception is recommended while taking this medication and for 2 months after stopping it. Talk to your care team about reliable forms of contraception. Do not breast-feed while taking this medication and for 1 week after stopping therapy. Use a condom during sex and for 4 months after stopping therapy. Tell your care team right away if you think your  partner might be pregnant. This medication can cause serious birth defects. This medication may cause infertility. Talk to your care team if you are concerned about your fertility. What side effects may I notice from receiving this medication? Side effects that you should report to your care team as soon as possible: Allergic reactions--skin rash, itching, hives, swelling of the face, lips, tongue, or throat Change in vision such as blurry vision, seeing halos around lights, vision loss Infection--fever, chills, cough, or sore throat Infusion reactions--chest pain, shortness of breath or trouble breathing, feeling faint or lightheaded Low red blood cell level--unusual weakness or fatigue, dizziness, headache, trouble breathing Pain, tingling, or numbness in the hands or feet Painful swelling, warmth, or redness of the skin, blisters or sores at the infusion site Redness, blistering, peeling, or loosening of the skin, including inside the mouth Sudden or severe stomach pain, bloody diarrhea, fever, nausea, vomiting Swelling of the ankles, hands, or feet Tumor lysis syndrome (TLS)--nausea, vomiting, diarrhea, decrease in the amount of urine, dark urine, unusual weakness or fatigue, confusion, muscle pain or cramps, fast or irregular heartbeat, joint pain Unusual bruising or bleeding Side effects that usually do not require medical attention (report to your care team if they continue or are bothersome): Change in nail shape, thickness, or color Change in taste Hair loss Increased tears This list may not describe all possible side effects. Call your doctor for medical advice about side effects. You may report side effects to FDA at 1-800-FDA-1088. Where should I keep my medication? This medication is given in a hospital or clinic. It will not be stored at home. NOTE: This sheet is a summary. It may not cover all possible information. If you have questions about this medicine, talk to your doctor,  pharmacist, or health care provider.  2024 Elsevier/Gold Standard (2021-11-05 00:00:00)  Cyclophosphamide (Cytoxan) Injection What is this medication? CYCLOPHOSPHAMIDE (sye kloe FOSS fa mide) treats some types of cancer. It works by slowing down the growth of cancer cells. This medicine may be used for other purposes; ask your health care provider or pharmacist if you have questions. COMMON BRAND NAME(S): Cyclophosphamide, Cytoxan, Neosar What should I tell my care team before I take this medication? They need to know if you have any of these conditions: Heart disease Irregular heartbeat or rhythm Infection Kidney problems Liver disease Low blood cell levels (white cells, platelets, or red blood cells) Lung disease Previous radiation Trouble passing urine An unusual or allergic reaction to cyclophosphamide, other medications, foods, dyes, or preservatives Pregnant or trying to get pregnant Breast-feeding How should I use this medication? This medication is injected into a vein. It is given by your care team in a hospital or clinic setting. Talk to your care team about the use of this medication in children. Special care may be needed. Overdosage: If you think you have taken too much of this medicine contact a poison control center or emergency room at once. NOTE: This medicine is only for  you. Do not share this medicine with others. What if I miss a dose? Keep appointments for follow-up doses. It is important not to miss your dose. Call your care team if you are unable to keep an appointment. What may interact with this medication? Amphotericin B Amiodarone Azathioprine Certain antivirals for HIV or hepatitis Certain medications for blood pressure, such as enalapril, lisinopril, quinapril Cyclosporine Diuretics Etanercept Indomethacin Medications that relax muscles Metronidazole Natalizumab Tamoxifen Warfarin This list may not describe all possible interactions. Give your  health care provider a list of all the medicines, herbs, non-prescription drugs, or dietary supplements you use. Also tell them if you smoke, drink alcohol, or use illegal drugs. Some items may interact with your medicine. What should I watch for while using this medication? This medication may make you feel generally unwell. This is not uncommon as chemotherapy can affect healthy cells as well as cancer cells. Report any side effects. Continue your course of treatment even though you feel ill unless your care team tells you to stop. You may need blood work while you are taking this medication. This medication may increase your risk of getting an infection. Call your care team for advice if you get a fever, chills, sore throat, or other symptoms of a cold or flu. Do not treat yourself. Try to avoid being around people who are sick. Avoid taking medications that contain aspirin, acetaminophen, ibuprofen, naproxen, or ketoprofen unless instructed by your care team. These medications may hide a fever. Be careful brushing or flossing your teeth or using a toothpick because you may get an infection or bleed more easily. If you have any dental work done, tell your dentist you are receiving this medication. Drink water or other fluids as directed. Urinate often, even at night. Some products may contain alcohol. Ask your care team if this medication contains alcohol. Be sure to tell all care teams you are taking this medicine. Certain medicines, like metronidazole and disulfiram, can cause an unpleasant reaction when taken with alcohol. The reaction includes flushing, headache, nausea, vomiting, sweating, and increased thirst. The reaction can last from 30 minutes to several hours. Talk to your care team if you wish to become pregnant or think you might be pregnant. This medication can cause serious birth defects if taken during pregnancy and for 1 year after the last dose. A negative pregnancy test is required  before starting this medication. A reliable form of contraception is recommended while taking this medication and for 1 year after the last dose. Talk to your care team about reliable forms of contraception. Do not father a child while taking this medication and for 4 months after the last dose. Use a condom during this time period. Do not breast-feed while taking this medication or for 1 week after the last dose. This medication may cause infertility. Talk to your care team if you are concerned about your fertility. Talk to your care team about your risk of cancer. You may be more at risk for certain types of cancer if you take this medication. What side effects may I notice from receiving this medication? Side effects that you should report to your care team as soon as possible: Allergic reactions--skin rash, itching, hives, swelling of the face, lips, tongue, or throat Dry cough, shortness of breath or trouble breathing Heart failure--shortness of breath, swelling of the ankles, feet, or hands, sudden weight gain, unusual weakness or fatigue Heart muscle inflammation--unusual weakness or fatigue, shortness of breath, chest pain, fast  or irregular heartbeat, dizziness, swelling of the ankles, feet, or hands Heart rhythm changes--fast or irregular heartbeat, dizziness, feeling faint or lightheaded, chest pain, trouble breathing Infection--fever, chills, cough, sore throat, wounds that don't heal, pain or trouble when passing urine, general feeling of discomfort or being unwell Kidney injury--decrease in the amount of urine, swelling of the ankles, hands, or feet Liver injury--right upper belly pain, loss of appetite, nausea, light-colored stool, dark yellow or brown urine, yellowing skin or eyes, unusual weakness or fatigue Low red blood cell level--unusual weakness or fatigue, dizziness, headache, trouble breathing Low sodium level--muscle weakness, fatigue, dizziness, headache, confusion Red or  dark brown urine Unusual bruising or bleeding Side effects that usually do not require medical attention (report to your care team if they continue or are bothersome): Hair loss Irregular menstrual cycles or spotting Loss of appetite Nausea Pain, redness, or swelling with sores inside the mouth or throat Vomiting This list may not describe all possible side effects. Call your doctor for medical advice about side effects. You may report side effects to FDA at 1-800-FDA-1088. Where should I keep my medication? This medication is given in a hospital or clinic. It will not be stored at home. NOTE: This sheet is a summary. It may not cover all possible information. If you have questions about this medicine, talk to your doctor, pharmacist, or health care provider.  2024 Elsevier/Gold Standard (2022-01-15 00:00:00)

## 2023-03-25 NOTE — Research (Signed)
[employer] 2021-05 - Specimen Collection Study to Evaluate Biomarkers in Subjects with Cancer    Patient Heidi Mitchell was identified by Dr. Pamelia Hoit as a potential candidate for the above listed study.  This Clinical Research Coordinator met with Heidi Mitchell, Heidi Mitchell on 03/25/23 in a manner and location that ensures patient privacy to discuss participation in the above listed research study.  Patient is Accompanied by her husband .  Patient was previously provided with informed consent documents.  Patient has not yet read the informed consent documents and so documents were reviewed page by page today.  As outlined in the informed consent form, this Coordinator and Heidi Mitchell discussed the purpose of the research study, the investigational nature of the study, study procedures and requirements for study participation, potential risks and benefits of study participation, as well as alternatives to participation.  This study is not blinded or double-blinded. The patient understands participation is voluntary and they may withdraw from study participation at any time.  This study does not involve randomization.  This study does not involve an investigational drug or device. This study does not involve a placebo. Patient understands enrollment is pending full eligibility review.   Confidentiality and how the patient's information will be used as part of study participation were discussed.  Patient was informed there is not reimbursement provided for their time and effort spent on trial participation.  The patient is encouraged to discuss research study participation with their insurance provider to determine what costs they may incur as part of study participation, including research related injury.    All questions were answered to patient's satisfaction.  The informed consent with embedded HIPAA language was reviewed page by page.  The patient's mental and emotional status is appropriate to  provide informed consent, and the patient verbalizes an understanding of study participation.  Patient has agreed to participate in the above listed research study and has voluntarily signed the informed consent version [employer] Approved Version 26 Sep 2020 with embedded HIPAA language, version 3  on 03/25/23 at 9:00AM.  The patient was provided with a copy of the signed informed consent form with embedded HIPAA language for their reference.  No study specific procedures were obtained prior to the signing of the informed consent document.  Approximately 30 minutes were spent with the patient reviewing the informed consent documents.  Patient was not requested to complete a Release of Information form.  This Coordinator has reviewed this patient's inclusion and exclusion criteria and confirmed Heidi Mitchell is eligible for study participation.  Patient will continue with enrollment.  Menopausal status (women only): Heidi Mitchell is postmenopausal   Eligibility confirmed by treating investigator, who also agrees that patient should proceed with enrollment.  Medical History:  High Blood Pressure  No Coronary Artery Disease No Lupus    No Rheumatoid Arthritis  No Diabetes   No      If yes, which type?      N/a Lynch Syndrome  No  Is the patient currently taking a magnesium supplement?   No If yes, dose and frequency? N/a Indication? N/a Start date? N/a  Does the patient have a personal history of cancer (greater than 5 years ago)?  No If yes, Cancer type and date of diagnosis?   N/a  Has this previous diagnosis been treated? N/a      If so, treatment type? N/a   Start and end dates of last treatment cycle? N/a  Does the  patient have a family history of cancer in 1st or 2nd degree relatives? Yes If yes, Relationship(s) and Cancer type(s)? Aunt- Breast   Does the patient have history of alcohol consumption? No   If yes, current or former? N/a If former, year stopped? N/a Number of  years? N/a Drinks per week? N/a  Does the patient have history of cigarette, cigar, pipe, or chewing tobacco use?  No  If yes, current for former? N/a If yes, type (Cigarette, cigar, pipe, and/or chewing tobacco)? N/a   If former, year stopped? N/a Number of years? N/a Packs/number/containers per day? N/a  Patient has consented and completed all study study questions but due to having MRI agents administered within less than 24 hours, the patient will have their blood collection on 03/31/2023.   Mirah Nevins, [facility] 03/25/2023 9:11 AM

## 2023-03-25 NOTE — Telephone Encounter (Signed)
Contacted patient in attempt to disclose results of genetic testing.  LVM with contact information requesting a call back.  

## 2023-03-25 NOTE — Telephone Encounter (Signed)
Called Pt to discuss 03/24/23 BR breast results with the below recommendation:  "Recommend MRI guided biopsy of the linear non mass enhancement extending posteriorly from the dominant mass in the right breast (image 56) to define disease extent (if this would change treatment/management). "  Advised Pt that either desk RN or staff from [facility] will be in touch to schedule. Pt verbalized understanding.

## 2023-03-25 NOTE — Research (Signed)
[employer] 2021-05 - Specimen Collection Study to Evaluate Biomarkers in Subjects with Cancer    The patient is ineligible to complete the [employer] study due to their treatment starting today and being unable to collect specimen beforehand. Them and their doctor have been informed on this matter.  Brandin Stetzer, [facility] 03/25/2023 2:20 PM

## 2023-03-28 ENCOUNTER — Other Ambulatory Visit: Payer: Self-pay

## 2023-03-28 ENCOUNTER — Telehealth: Payer: Self-pay

## 2023-03-28 ENCOUNTER — Inpatient Hospital Stay: Payer: Medicare HMO

## 2023-03-28 ENCOUNTER — Telehealth: Payer: Self-pay | Admitting: *Deleted

## 2023-03-28 VITALS — BP 120/62 | HR 72 | Temp 99.4°F | Resp 16

## 2023-03-28 DIAGNOSIS — Z5111 Encounter for antineoplastic chemotherapy: Secondary | ICD-10-CM | POA: Diagnosis not present

## 2023-03-28 DIAGNOSIS — Z79899 Other long term (current) drug therapy: Secondary | ICD-10-CM | POA: Diagnosis not present

## 2023-03-28 DIAGNOSIS — R5383 Other fatigue: Secondary | ICD-10-CM | POA: Diagnosis not present

## 2023-03-28 DIAGNOSIS — R439 Unspecified disturbances of smell and taste: Secondary | ICD-10-CM | POA: Diagnosis not present

## 2023-03-28 DIAGNOSIS — E785 Hyperlipidemia, unspecified: Secondary | ICD-10-CM | POA: Diagnosis not present

## 2023-03-28 DIAGNOSIS — R21 Rash and other nonspecific skin eruption: Secondary | ICD-10-CM | POA: Diagnosis not present

## 2023-03-28 DIAGNOSIS — C50411 Malignant neoplasm of upper-outer quadrant of right female breast: Secondary | ICD-10-CM | POA: Diagnosis not present

## 2023-03-28 DIAGNOSIS — Z170 Estrogen receptor positive status [ER+]: Secondary | ICD-10-CM | POA: Diagnosis not present

## 2023-03-28 DIAGNOSIS — D709 Neutropenia, unspecified: Secondary | ICD-10-CM | POA: Diagnosis not present

## 2023-03-28 MED ORDER — PEGFILGRASTIM-CBQV 6 MG/0.6ML ~~LOC~~ SOSY
6.0000 mg | PREFILLED_SYRINGE | Freq: Once | SUBCUTANEOUS | Status: AC
  Administered 2023-03-28: 6 mg via SUBCUTANEOUS
  Filled 2023-03-28: qty 0.6

## 2023-03-28 NOTE — Patient Instructions (Signed)

## 2023-03-28 NOTE — Telephone Encounter (Signed)
Returned Pt's call regarding "medication questions". Pt states she started to take Claritin non-drowsy and it has made her fatigued. Pt states she would like to stop the Claritin. Advised Pt she could try taking at night to prevent fatigue during the day. Pt states "I don't like to take medication if I don't have to". Advised Pt to discuss with MD on 03/31/23 appt. Pt verbalized understanding.

## 2023-03-28 NOTE — Telephone Encounter (Signed)
-----   Message from Nurse Karn Pickler sent at 03/25/2023  2:07 PM EDT ----- Regarding: Dr. Werner Lean 1st tx f/u call Dr. Pamelia Hoit 1st tx f/u call - T/C - tolerated well

## 2023-03-28 NOTE — Telephone Encounter (Signed)
Noted that pt had called & talked with desk nurse about claritin & feeling fatigued.  Pt had been taking claritin since treatment per infusion RN instructions. Informed pt that fatigue may be from treatment & explained reasons for claritin/tylenol (to help with bone pain related to Pegfilgrastim) & to take @ 1 hr prior to injection today & continue claritin for 7 days & tylenol as needed as long as no fever.  She reports some SOB & dizziness in am which is normal for her & also some constipation which is normal for her. We discussed a stool softener & mild laxative if needed.  She knows her next appt & how to reach [name] if needed.

## 2023-03-29 NOTE — Progress Notes (Signed)
Patient Care Team: Karie Schwalbe, MD as PCP - General (Internal Medicine) Gladys Damme as Consulting Physician Hildred Laser, MD as Referring Physician (Obstetrics and Gynecology) Serena Croissant, MD as Consulting Physician (Hematology and Oncology) Lonie Peak, MD as Attending Physician (Radiation Oncology) [name], MD as Consulting Physician (General Surgery) Donnelly Angelica, RN as Oncology Nurse Navigator Pershing Proud, RN as Oncology Nurse Navigator  DIAGNOSIS:  Encounter Diagnosis  Name Primary?   Malignant neoplasm of upper-outer quadrant of right breast in female, estrogen receptor positive (HCC) Yes    SUMMARY OF ONCOLOGIC HISTORY: Oncology History  Malignant neoplasm of upper-outer quadrant of right breast in female, estrogen receptor positive (HCC)  03/04/2023 Initial Diagnosis   Palpable right breast lump UOQ 12 o'clock position: 1.7 cm by ultrasound, 3 abnormal lymph nodes: 1 biopsy positive with extranodal extension.  Biopsy of the lump: Grade 2 IDC with medullary features ER 60% weak, PR 0%, Ki-67 80%, HER2 negative   03/16/2023 Cancer Staging   Staging form: Breast, AJCC 8th Edition - Clinical: Stage IIA (cT1c, cN1, cM0, G2, ER+, PR-, HER2-) - Signed by Serena Croissant, MD on 03/16/2023 Stage prefix: Initial diagnosis Histologic grading system: 3 grade system   03/24/2023 Genetic Testing   Negative [facility] Common Hereditary Cancers +RNA Panel.  Report date is 03/24/2023.    The [facility] Common Hereditary Cancers + RNA Panel includes sequencing, deletion/duplication, and RNA analysis of the following 48 genes: APC, ATM, AXIN2, BAP1, BARD1, BMPR1A, BRCA1, BRCA2, BRIP1, CDH1, CDK4*, CDKN2A*, CHEK2, CTNNA1, DICER1, EPCAM* (del/dup only), FH, GREM1* (promoter dup analysis only), HOXB13*, KIT*, MBD4*, MEN1, MLH1, MSH2, MSH3, MSH6, MUTYH, NF1, NTHL1, PALB2, PDGFRA*, PMS2, POLD1, POLE, PTEN, RAD51C, RAD51D, SDHA (sequencing only), SDHB, SDHC, SDHD, SMAD4,  SMARCA4, STK11, TP53, TSC1, TSC2, VHL.  *Genes without RNA analysis.     03/25/2023 -  Chemotherapy   Patient is on Treatment Plan : BREAST TC q21d       CHIEF COMPLIANT: Toxicity Check Cycle 1 day 8 Taxotere Cytoxan   INTERVAL HISTORY: Heidi Mitchell is a 72 y.o. female is here because of recent diagnosis of right breast cancer. She presents to the clinic for a follow-up. PT reports treatment went well. She denies any nausea or constipation. She does have fatigue. Taste is a little off. Appetite is ok. Complains of rash started 03/24/23 on both arms and chest. Claritin makes her tired. She does have aches in her bones.    ALLERGIES:  is allergic to sertraline hcl.  MEDICATIONS:  Current Outpatient Medications  Medication Sig Dispense Refill   atorvastatin (LIPITOR) 10 MG tablet Take 1 tablet (10 mg total) by mouth daily. 90 tablet 1   dexamethasone (DECADRON) 4 MG tablet Take 1 tablet day before chemo and 1 tablet day after chemo with food 8 tablet 0   estradiol (ESTRACE) 0.1 MG/GM vaginal cream Place 1 Applicatorful vaginally at bedtime.     methylPREDNISolone (MEDROL DOSEPAK) 4 MG TBPK tablet Use as directed 21 tablet 0   prochlorperazine (COMPAZINE) 10 MG tablet Take 1 tablet (10 mg total) by mouth every 6 (six) hours as needed for nausea or vomiting. 30 tablet 1   sodium fluoride (PREVIDENT 5000 PLUS) 1.1 % CREA dental cream Take by mouth.     lidocaine-prilocaine (EMLA) cream Apply to affected area once (Patient not taking: Reported on 03/23/2023) 30 g 3   omeprazole (PRILOSEC) 20 MG capsule Take 1 capsule (20 mg total) by mouth every other day. On an  empty stomach (Patient not taking: Reported on 03/23/2023) 45 capsule 1   ondansetron (ZOFRAN) 8 MG tablet Take 1 tablet (8 mg total) by mouth every 8 (eight) hours as needed for nausea or vomiting. Start on the third day after chemotherapy. 30 tablet 1   No current facility-administered medications for this visit.    PHYSICAL  EXAMINATION: ECOG PERFORMANCE STATUS: 1 - Symptomatic but completely ambulatory  There were no vitals filed for this visit. Filed Weights   03/31/23 1203  Weight: 113 lb 11.2 oz (51.6 kg)      LABORATORY DATA:  I have reviewed the data as listed    Latest Ref Rng & Units 03/31/2023   10:58 AM 03/25/2023    9:18 AM 03/16/2023   11:37 AM  CMP  Glucose 70 - 99 mg/dL 90  161  94   BUN 8 - 23 mg/dL 17  16  11    Creatinine 0.44 - 1.00 mg/dL 0.96  0.45  4.09   Sodium 135 - 145 mmol/L 137  138  142   Potassium 3.5 - 5.1 mmol/L 4.1  4.1  4.7   Chloride 98 - 111 mmol/L 105  106  108   CO2 22 - 32 mmol/L 26  24  29    Calcium 8.9 - 10.3 mg/dL 9.0  9.5  9.6   Total Protein 6.5 - 8.1 g/dL 6.6  7.2  6.4   Total Bilirubin 0.3 - 1.2 mg/dL 3.0  1.4  1.1   Alkaline Phos 38 - 126 U/L 54  58  63   AST 15 - 41 U/L 15  20  17    ALT 0 - 44 U/L 11  15  13      Lab Results  Component Value Date   WBC 1.3 (L) 03/31/2023   HGB 11.5 (L) 03/31/2023   HCT 34.8 (L) 03/31/2023   MCV 86.6 03/31/2023   PLT 180 03/31/2023   NEUTROABS 0.1 (LL) 03/31/2023    ASSESSMENT & PLAN:  Malignant neoplasm of upper-outer quadrant of right breast in female, estrogen receptor positive (HCC) 03/04/2023:Palpable right breast lump UOQ 12 o'clock position: 1.7 cm by ultrasound, 3 abnormal lymph nodes: 1 biopsy positive with extranodal extension.  Biopsy of the lump: Grade 2 IDC with medullary features ER 60% weak, PR 0%, Ki-67 80%, HER2 negative  03/24/2023: Breast MRI: Biopsy-proven malignancy 1.8 cm, non-mass enhancement measuring 3 cm, biopsy-proven axillary lymph node.  MRI guided biopsy is being planned for 04/01/2023  Treatment plan: Neoadjuvant chemotherapy with Taxotere and Cytoxan every 3 weeks x 4 Breast conserving surgery with targeted node dissection Adjuvant radiation Adjuvant antiestrogen therapy if the final pathology is truly is ER  positive. ----------------------------------------------------------------------------------------------------------------------------------- Current treatment: Cycle 1 day 8 Taxotere Cytoxan Chemo toxicities: Rash on the left forearm: Since MRI was done.  I will prescribe her Medrol Dosepak because topical cortisone is not helping her. Fatigue Loss of taste Neutropenia: ANC 0.1: We will reduce the dosage of her chemotherapy for cycle 2. Return to clinic in 2 weeks for cycle 2    No orders of the defined types were placed in this encounter.  The patient has a good understanding of the overall plan. she agrees with it. she will call with any problems that may develop before the next visit here. Total time spent: 30 mins including face to face time and time spent for planning, charting and co-ordination of care   Tamsen Meek, MD 03/31/23    I Janan Ridge am acting  as a scribe for Dr.Michayla Mcneil  I have reviewed the above documentation for accuracy and completeness, and I agree with the above.

## 2023-03-30 ENCOUNTER — Other Ambulatory Visit: Payer: Self-pay

## 2023-03-30 DIAGNOSIS — C50411 Malignant neoplasm of upper-outer quadrant of right female breast: Secondary | ICD-10-CM

## 2023-03-31 ENCOUNTER — Inpatient Hospital Stay (HOSPITAL_BASED_OUTPATIENT_CLINIC_OR_DEPARTMENT_OTHER): Payer: Medicare HMO | Admitting: Hematology and Oncology

## 2023-03-31 ENCOUNTER — Other Ambulatory Visit: Payer: Medicare HMO

## 2023-03-31 ENCOUNTER — Telehealth: Payer: Self-pay | Admitting: *Deleted

## 2023-03-31 ENCOUNTER — Inpatient Hospital Stay: Payer: Medicare HMO

## 2023-03-31 ENCOUNTER — Other Ambulatory Visit: Payer: Self-pay

## 2023-03-31 VITALS — Wt 113.7 lb

## 2023-03-31 VITALS — BP 115/66 | HR 80 | Temp 98.7°F | Resp 18

## 2023-03-31 DIAGNOSIS — C50411 Malignant neoplasm of upper-outer quadrant of right female breast: Secondary | ICD-10-CM

## 2023-03-31 DIAGNOSIS — R439 Unspecified disturbances of smell and taste: Secondary | ICD-10-CM | POA: Diagnosis not present

## 2023-03-31 DIAGNOSIS — E785 Hyperlipidemia, unspecified: Secondary | ICD-10-CM | POA: Diagnosis not present

## 2023-03-31 DIAGNOSIS — R21 Rash and other nonspecific skin eruption: Secondary | ICD-10-CM | POA: Diagnosis not present

## 2023-03-31 DIAGNOSIS — Z79899 Other long term (current) drug therapy: Secondary | ICD-10-CM | POA: Diagnosis not present

## 2023-03-31 DIAGNOSIS — Z95828 Presence of other vascular implants and grafts: Secondary | ICD-10-CM

## 2023-03-31 DIAGNOSIS — Z5111 Encounter for antineoplastic chemotherapy: Secondary | ICD-10-CM | POA: Diagnosis not present

## 2023-03-31 DIAGNOSIS — R5383 Other fatigue: Secondary | ICD-10-CM | POA: Diagnosis not present

## 2023-03-31 DIAGNOSIS — D709 Neutropenia, unspecified: Secondary | ICD-10-CM | POA: Diagnosis not present

## 2023-03-31 DIAGNOSIS — Z170 Estrogen receptor positive status [ER+]: Secondary | ICD-10-CM | POA: Diagnosis not present

## 2023-03-31 LAB — CBC WITH DIFFERENTIAL (CANCER CENTER ONLY)
Abs Immature Granulocytes: 0.04 10*3/uL (ref 0.00–0.07)
Basophils Absolute: 0 10*3/uL (ref 0.0–0.1)
Basophils Relative: 2 %
Eosinophils Absolute: 0 10*3/uL (ref 0.0–0.5)
Eosinophils Relative: 2 %
HCT: 34.8 % — ABNORMAL LOW (ref 36.0–46.0)
Hemoglobin: 11.5 g/dL — ABNORMAL LOW (ref 12.0–15.0)
Immature Granulocytes: 3 %
Lymphocytes Relative: 66 %
Lymphs Abs: 0.8 10*3/uL (ref 0.7–4.0)
MCH: 28.6 pg (ref 26.0–34.0)
MCHC: 33 g/dL (ref 30.0–36.0)
MCV: 86.6 fL (ref 80.0–100.0)
Monocytes Absolute: 0.3 10*3/uL (ref 0.1–1.0)
Monocytes Relative: 21 %
Neutro Abs: 0.1 10*3/uL — CL (ref 1.7–7.7)
Neutrophils Relative %: 6 %
Platelet Count: 180 10*3/uL (ref 150–400)
RBC: 4.02 MIL/uL (ref 3.87–5.11)
RDW: 14.5 % (ref 11.5–15.5)
WBC Count: 1.3 10*3/uL — ABNORMAL LOW (ref 4.0–10.5)
nRBC: 0 % (ref 0.0–0.2)

## 2023-03-31 LAB — CMP (CANCER CENTER ONLY)
ALT: 11 U/L (ref 0–44)
AST: 15 U/L (ref 15–41)
Albumin: 3.9 g/dL (ref 3.5–5.0)
Alkaline Phosphatase: 54 U/L (ref 38–126)
Anion gap: 6 (ref 5–15)
BUN: 17 mg/dL (ref 8–23)
CO2: 26 mmol/L (ref 22–32)
Calcium: 9 mg/dL (ref 8.9–10.3)
Chloride: 105 mmol/L (ref 98–111)
Creatinine: 0.55 mg/dL (ref 0.44–1.00)
GFR, Estimated: >60 mL/min (ref >60)
Glucose, Bld: 90 mg/dL (ref 70–99)
Potassium: 4.1 mmol/L (ref 3.5–5.1)
Sodium: 137 mmol/L (ref 135–145)
Total Bilirubin: 3 mg/dL — ABNORMAL HIGH (ref 0.3–1.2)
Total Protein: 6.6 g/dL (ref 6.5–8.1)

## 2023-03-31 MED ORDER — SODIUM CHLORIDE 0.9% FLUSH
10.0000 mL | INTRAVENOUS | Status: AC | PRN
  Administered 2023-03-31: 10 mL

## 2023-03-31 MED ORDER — METHYLPREDNISOLONE 4 MG PO TBPK: ORAL_TABLET | ORAL | 0 refills | Status: DC

## 2023-03-31 MED ORDER — HEPARIN SOD (PORK) LOCK FLUSH 100 UNIT/ML IV SOLN
500.0000 [IU] | INTRAVENOUS | Status: AC | PRN
  Administered 2023-03-31: 500 [IU]

## 2023-03-31 NOTE — Assessment & Plan Note (Addendum)
03/04/2023:Palpable right breast lump UOQ 12 o'clock position: 1.7 cm by ultrasound, 3 abnormal lymph nodes: 1 biopsy positive with extranodal extension.  Biopsy of the lump: Grade 2 IDC with medullary features ER 60% weak, PR 0%, Ki-67 80%, HER2 negative  03/24/2023: Breast MRI: Biopsy-proven malignancy 1.8 cm, non-mass enhancement measuring 3 cm, biopsy-proven axillary lymph node.  Treatment plan: Neoadjuvant chemotherapy with Taxotere and Cytoxan every 3 weeks x 4 Breast conserving surgery with targeted node dissection Adjuvant radiation Adjuvant antiestrogen therapy if the final pathology is truly is ER positive. ----------------------------------------------------------------------------------------------------------------------------------- Current treatment: Cycle 1 day 8 Taxotere Cytoxan Chemo toxicities: Rash on the left forearm: Since MRI was done.  I will prescribe her Medrol Dosepak because topical cortisone is not helping her. Fatigue Loss of taste Neutropenia: ANC 0.1: We will reduce the dosage of her chemotherapy for cycle 2. Return to clinic in 2 weeks for cycle 2

## 2023-03-31 NOTE — Telephone Encounter (Signed)
CRITICAL VALUE STICKER  CRITICAL VALUE: ANC 0.1  RECEIVER (on-site recipient of call): Rudi Rummage, RN/Sandi K, RN  DATE & TIME NOTIFIED: 03/31/23: Lab notified Judy @ 1209; Sandi @ 1220  MESSENGER (representative from lab):Jessica  MD NOTIFIED: Dr. Pamelia Hoit   TIME OF NOTIFICATION:1228  RESPONSE: MD acknowledged information. No new orders received

## 2023-04-01 ENCOUNTER — Other Ambulatory Visit: Payer: Self-pay | Admitting: Hematology and Oncology

## 2023-04-01 ENCOUNTER — Ambulatory Visit
Admission: RE | Admit: 2023-04-01 | Discharge: 2023-04-01 | Disposition: A | Discharge status: Home / Self Care | Payer: Medicare HMO | Source: Ambulatory Visit | Attending: Hematology and Oncology | Admitting: Hematology and Oncology

## 2023-04-01 ENCOUNTER — Telehealth: Payer: Self-pay | Admitting: *Deleted

## 2023-04-01 ENCOUNTER — Ambulatory Visit: Admission: RE | Admit: 2023-04-01 | Payer: Medicare HMO | Source: Ambulatory Visit

## 2023-04-01 DIAGNOSIS — C50411 Malignant neoplasm of upper-outer quadrant of right female breast: Secondary | ICD-10-CM

## 2023-04-01 DIAGNOSIS — C50911 Malignant neoplasm of unspecified site of right female breast: Secondary | ICD-10-CM | POA: Diagnosis not present

## 2023-04-01 DIAGNOSIS — R928 Other abnormal and inconclusive findings on diagnostic imaging of breast: Secondary | ICD-10-CM | POA: Diagnosis not present

## 2023-04-01 MED ORDER — GADOPICLENOL 0.5 MMOL/ML IV SOLN
5.0000 mL | Freq: Once | INTRAVENOUS | Status: AC | PRN
  Administered 2023-04-01: 5 mL via INTRAVENOUS

## 2023-04-01 NOTE — Telephone Encounter (Signed)
Received Telephone Advice fax from after hours [facility].   Patient had contacted [facility] 7/18/245 @ 1725  In report, patient stated she had been running a fever between 100.4 and 101.4. She reports that she had to pick up medication and that her car was not air conditioned. She also reported back pain. She was advised to seek care from PCP or another healthcare provider.  Contacted patient after fax received in this office 04/01/23 at approximately 1530.   Patient reports temperature is completely back to normal today. She states she thinks she just was overheated due to being in the car and outside temperature in the 90's. She said she didn't go to ED or call the doctor because her temperature steadily went down over the evening and was normal this morning. She states she feels fine and her back pain is gone now.  Encouraged patient to continue to monitor temperature and notify office or after hours service if any changes. Also encouraged patient to drink lots of fluids, especially important if she will be out in the heat.  She verbalized understanding of all information.

## 2023-04-04 ENCOUNTER — Encounter: Payer: Self-pay | Admitting: *Deleted

## 2023-04-06 ENCOUNTER — Telehealth: Payer: Self-pay | Admitting: Genetic Counselor

## 2023-04-06 ENCOUNTER — Ambulatory Visit: Payer: Self-pay | Admitting: Genetic Counselor

## 2023-04-06 DIAGNOSIS — C50411 Malignant neoplasm of upper-outer quadrant of right female breast: Secondary | ICD-10-CM

## 2023-04-06 DIAGNOSIS — Z1379 Encounter for other screening for genetic and chromosomal anomalies: Secondary | ICD-10-CM

## 2023-04-06 DIAGNOSIS — Z803 Family history of malignant neoplasm of breast: Secondary | ICD-10-CM

## 2023-04-06 NOTE — Telephone Encounter (Signed)
Disclosed negative genetics.    

## 2023-04-06 NOTE — Progress Notes (Signed)
HPI:   [name] was previously seen in the [facility] due to a personal and family history of breast cancer and concerns regarding a hereditary predisposition to cancer.    [name] recent genetic test results were disclosed to her by telephone. These results and recommendations are discussed in more detail below.  CANCER HISTORY:  Oncology History  Malignant neoplasm of upper-outer quadrant of right breast in female, estrogen receptor positive (HCC)  03/04/2023 Initial Diagnosis   Palpable right breast lump UOQ 12 o'clock position: 1.7 cm by ultrasound, 3 abnormal lymph nodes: 1 biopsy positive with extranodal extension.  Biopsy of the lump: Grade 2 IDC with medullary features ER 60% weak, PR 0%, Ki-67 80%, HER2 negative   03/16/2023 Cancer Staging   Staging form: Breast, AJCC 8th Edition - Clinical: Stage IIA (cT1c, cN1, cM0, G2, ER+, PR-, HER2-) - Signed by Serena Croissant, MD on 03/16/2023 Stage prefix: Initial diagnosis Histologic grading system: 3 grade system   03/24/2023 Genetic Testing   Negative [facility] Common Hereditary Cancers +RNA Panel.  Report date is 03/24/2023.    The [facility] Common Hereditary Cancers + RNA Panel includes sequencing, deletion/duplication, and RNA analysis of the following 48 genes: APC, ATM, AXIN2, BAP1, BARD1, BMPR1A, BRCA1, BRCA2, BRIP1, CDH1, CDK4*, CDKN2A*, CHEK2, CTNNA1, DICER1, EPCAM* (del/dup only), FH, GREM1* (promoter dup analysis only), HOXB13*, KIT*, MBD4*, MEN1, MLH1, MSH2, MSH3, MSH6, MUTYH, NF1, NTHL1, PALB2, PDGFRA*, PMS2, POLD1, POLE, PTEN, RAD51C, RAD51D, SDHA (sequencing only), SDHB, SDHC, SDHD, SMAD4, SMARCA4, STK11, TP53, TSC1, TSC2, VHL.  *Genes without RNA analysis.     03/25/2023 -  Chemotherapy   Patient is on Treatment Plan : BREAST TC q21d       FAMILY HISTORY:  We obtained a detailed, 4-generation family history.  Significant diagnoses are listed below:      Family History  Problem Relation Age of Onset    Cervical cancer Sister 52   Breast cancer Maternal Aunt          dx 59s         [name] is unaware of previous family history of genetic testing for hereditary cancer risks. There is no reported Ashkenazi Jewish ancestry. There is no known consanguinity.    GENETIC TEST RESULTS:  The [facility] Common Hereditary Cancers +RNA Panel found no pathogenic mutations.    The [facility] Common Hereditary Cancers + RNA Panel includes sequencing, deletion/duplication, and RNA analysis of the following 48 genes: APC, ATM, AXIN2, BAP1, BARD1, BMPR1A, BRCA1, BRCA2, BRIP1, CDH1, CDK4*, CDKN2A*, CHEK2, CTNNA1, DICER1, EPCAM* (del/dup only), FH, GREM1* (promoter dup analysis only), HOXB13*, KIT*, MBD4*, MEN1, MLH1, MSH2, MSH3, MSH6, MUTYH, NF1, NTHL1, PALB2, PDGFRA*, PMS2, POLD1, POLE, PTEN, RAD51C, RAD51D, SDHA (sequencing only), SDHB, SDHC, SDHD, SMAD4, SMARCA4, STK11, TP53, TSC1, TSC2, VHL.  *Genes without RNA analysis.  .   The test report has been scanned into EPIC and is located under the Molecular Pathology section of the Results Review tab.  A portion of the result report is included below for reference. Genetic testing reported out on March 24, 2023.      Even though a pathogenic variant was not identified, possible explanations for the cancer in the family may include: There may be no hereditary risk for cancer in the family. The cancers in [name] and/or her family may be sporadic/familial or due to other genetic and environmental factors. There may be a gene mutation in one of these genes that current testing methods cannot detect  but that chance is small. There could be another gene that has not yet been discovered, or that we have not yet tested, that is responsible for the cancer diagnoses in the family.  It is also possible there is a hereditary cause for the cancer in the family that [name] did not inherit.   Therefore, it is important to remain in touch with cancer genetics in  the future so that we can continue to offer [name] the most up to date genetic testing.  ADDITIONAL GENETIC TESTING:   [name] genetic testing was fairly extensive.  If there are additional relevant genes identified to increase cancer risk that can be analyzed in the future, we would be happy to discuss and coordinate this testing at that time.      CANCER SCREENING RECOMMENDATIONS:  [name] test result is considered negative (normal).  This means that we have not identified a hereditary cause for her personal history of breast cancer at this time.   An individual's cancer risk and medical management are not determined by genetic test results alone. Overall cancer risk assessment incorporates additional factors, including personal medical history, family history, and any available genetic information that may result in a personalized plan for cancer prevention and surveillance. Therefore, it is recommended she continue to follow the cancer management and screening guidelines provided by her oncology and primary healthcare provider.  RECOMMENDATIONS FOR FAMILY MEMBERS:   Since she did not inherit a identifiable mutation in a cancer predisposition gene included on this panel, her children could not have inherited a known mutation from her in one of these genes. Individuals in this family might be at some increased risk of developing cancer, over the general population risk, due to the family history of cancer.  Individuals in the family should notify their providers of the family history of cancer. We recommend women in this family have a yearly mammogram beginning at age 38, or 71 years younger than the earliest onset of cancer, an annual clinical breast exam, and perform monthly breast self-exams.  Risk models that take into account family history and hormonal history may be helpful in determining appropriate breast cancer screening options for family members.  Other members of the  family may still carry a pathogenic variant in one of these genes that [name] did not inherit. Based on the family history, relatives more closely related to her maternal aunt with a history of breast cancer in her 65s should be offered genetic counseling and testing. [name] can let us know if we can be of any assistance in coordinating genetic counseling and/or testing for these family members.    FOLLOW-UP:  Cancer genetics is a rapidly advancing field and it is possible that new genetic tests will be appropriate for her and/or her family members in the future. We encourage [name] to remain in contact with cancer genetics, so we can update her personal and family histories and let her know of advances in cancer genetics that may benefit this family.   Our contact number was provided.  She knows she is welcome to call us at anytime with additional questions or concerns.   Laurie Lovejoy M. Rennie Plowman, MS, [facility] Counselor [email or web address] (P) [phone]

## 2023-04-10 NOTE — Progress Notes (Signed)
Patient Care Team: Karie Schwalbe, MD as PCP - General (Internal Medicine) Gladys Damme as Consulting Physician Hildred Laser, MD as Referring Physician (Obstetrics and Gynecology) Serena Croissant, MD as Consulting Physician (Hematology and Oncology) Lonie Peak, MD as Attending Physician (Radiation Oncology) [name], MD as Consulting Physician (General Surgery) Donnelly Angelica, RN as Oncology Nurse Navigator Pershing Proud, RN as Oncology Nurse Navigator  DIAGNOSIS:  Encounter Diagnosis  Name Primary?   Malignant neoplasm of upper-outer quadrant of right breast in female, estrogen receptor positive (HCC) Yes    SUMMARY OF ONCOLOGIC HISTORY: Oncology History  Malignant neoplasm of upper-outer quadrant of right breast in female, estrogen receptor positive (HCC)  03/04/2023 Initial Diagnosis   Palpable right breast lump UOQ 12 o'clock position: 1.7 cm by ultrasound, 3 abnormal lymph nodes: 1 biopsy positive with extranodal extension.  Biopsy of the lump: Grade 2 IDC with medullary features ER 60% weak, PR 0%, Ki-67 80%, HER2 negative   03/16/2023 Cancer Staging   Staging form: Breast, AJCC 8th Edition - Clinical: Stage IIA (cT1c, cN1, cM0, G2, ER+, PR-, HER2-) - Signed by Serena Croissant, MD on 03/16/2023 Stage prefix: Initial diagnosis Histologic grading system: 3 grade system   03/24/2023 Genetic Testing   Negative [facility] Common Hereditary Cancers +RNA Panel.  Report date is 03/24/2023.    The [employer] Common Hereditary Cancers + RNA Panel includes sequencing, deletion/duplication, and RNA analysis of the following 48 genes: APC, ATM, AXIN2, BAP1, BARD1, BMPR1A, BRCA1, BRCA2, BRIP1, CDH1, CDK4*, CDKN2A*, CHEK2, CTNNA1, DICER1, EPCAM* (del/dup only), FH, GREM1* (promoter dup analysis only), HOXB13*, KIT*, MBD4*, MEN1, MLH1, MSH2, MSH3, MSH6, MUTYH, NF1, NTHL1, PALB2, PDGFRA*, PMS2, POLD1, POLE, PTEN, RAD51C, RAD51D, SDHA (sequencing only), SDHB, SDHC, SDHD, SMAD4,  SMARCA4, STK11, TP53, TSC1, TSC2, VHL.  *Genes without RNA analysis.     03/25/2023 -  Chemotherapy   Patient is on Treatment Plan : BREAST TC q21d       CHIEF COMPLIANT: Taxotere Cytoxan cycle 2  INTERVAL HISTORY: Heidi Mitchell is a 72 y.o. female is here because of recent diagnosis of right breast cancer. She presents to the clinic for a follow-up. Patient reports last treatment went well. She complains of back pain. She states that she has been having vaginal bleeding that started last week and lasted for about 4 days and stop. She said it did resume this morning. States that it is not a lot. She is also breaking out on arms, but she is using Cortizone for relief. Denies nausea had some constipation says it is normal for her. Tingling in the tips of finger mainly in the morning.  ALLERGIES:  is allergic to sertraline hcl.  MEDICATIONS:  Current Outpatient Medications  Medication Sig Dispense Refill   atorvastatin (LIPITOR) 10 MG tablet Take 1 tablet (10 mg total) by mouth daily. 90 tablet 1   dexamethasone (DECADRON) 4 MG tablet Take 1 tablet day before chemo and 1 tablet day after chemo with food 8 tablet 0   estradiol (ESTRACE) 0.1 MG/GM vaginal cream Place 1 Applicatorful vaginally at bedtime.     lidocaine-prilocaine (EMLA) cream Apply to affected area once 30 g 3   methylPREDNISolone (MEDROL DOSEPAK) 4 MG TBPK tablet Use as directed 21 tablet 0   omeprazole (PRILOSEC) 20 MG capsule Take 1 capsule (20 mg total) by mouth every other day. On an empty stomach 45 capsule 1   ondansetron (ZOFRAN) 8 MG tablet Take 1 tablet (8 mg total) by mouth every  8 (eight) hours as needed for nausea or vomiting. Start on the third day after chemotherapy. 30 tablet 1   prochlorperazine (COMPAZINE) 10 MG tablet Take 1 tablet (10 mg total) by mouth every 6 (six) hours as needed for nausea or vomiting. 30 tablet 1   sodium fluoride (PREVIDENT 5000 PLUS) 1.1 % CREA dental cream Take by mouth.     No  current facility-administered medications for this visit.   Facility-Administered Medications Ordered in Other Visits  Medication Dose Route Frequency Provider Last Rate Last Admin   0.9 %  sodium chloride infusion   Intravenous Once Serena Croissant, MD       cyclophosphamide (CYTOXAN) 600 mg in sodium chloride 0.9 % 250 mL chemo infusion  400 mg/m2 (Treatment Plan Recorded) Intravenous Once Serena Croissant, MD       dexamethasone (DECADRON) 10 mg in sodium chloride 0.9 % 50 mL IVPB  10 mg Intravenous Once Serena Croissant, MD       DOCEtaxel (TAXOTERE) 80 mg in sodium chloride 0.9 % 250 mL chemo infusion  50 mg/m2 (Treatment Plan Recorded) Intravenous Once Serena Croissant, MD       heparin lock flush 100 unit/mL  500 Units Intracatheter Once PRN Serena Croissant, MD       palonosetron (ALOXI) injection 0.25 mg  0.25 mg Intravenous Once Serena Croissant, MD       sodium chloride flush (NS) 0.9 % injection 10 mL  10 mL Intracatheter PRN Serena Croissant, MD        PHYSICAL EXAMINATION: ECOG PERFORMANCE STATUS: 1 - Symptomatic but completely ambulatory  Vitals:   04/15/23 0837  BP: (!) 131/59  Pulse: 68  Resp: 18  Temp: 97.8 F (36.6 C)  SpO2: 96%   Filed Weights   04/15/23 0837  Weight: 114 lb 9.6 oz (52 kg)      LABORATORY DATA:  I have reviewed the data as listed    Latest Ref Rng & Units 04/15/2023    8:16 AM 03/31/2023   10:58 AM 03/25/2023    9:18 AM  CMP  Glucose 70 - 99 mg/dL 82  90  161   BUN 8 - 23 mg/dL 15  17  16    Creatinine 0.44 - 1.00 mg/dL 0.96  0.45  4.09   Sodium 135 - 145 mmol/L 140  137  138   Potassium 3.5 - 5.1 mmol/L 3.5  4.1  4.1   Chloride 98 - 111 mmol/L 108  105  106   CO2 22 - 32 mmol/L 25  26  24    Calcium 8.9 - 10.3 mg/dL 8.8  9.0  9.5   Total Protein 6.5 - 8.1 g/dL 6.1  6.6  7.2   Total Bilirubin 0.3 - 1.2 mg/dL 0.9  3.0  1.4   Alkaline Phos 38 - 126 U/L 66  54  58   AST 15 - 41 U/L 17  15  20    ALT 0 - 44 U/L 15  11  15      Lab Results  Component Value  Date   WBC 10.7 (H) 04/15/2023   HGB 10.8 (L) 04/15/2023   HCT 33.4 (L) 04/15/2023   MCV 89.5 04/15/2023   PLT 291 04/15/2023   NEUTROABS 6.8 04/15/2023    ASSESSMENT & PLAN:  Malignant neoplasm of upper-outer quadrant of right breast in female, estrogen receptor positive (HCC) 03/04/2023:Palpable right breast lump UOQ 12 o'clock position: 1.7 cm by ultrasound, 3 abnormal lymph nodes: 1 biopsy positive with extranodal  extension.  Biopsy of the lump: Grade 2 IDC with medullary features ER 60% weak, PR 0%, Ki-67 80%, HER2 negative  03/24/2023: Breast MRI: Biopsy-proven malignancy 1.8 cm, non-mass enhancement measuring 3 cm, biopsy-proven axillary lymph node.  MRI guided biopsy is being planned for 04/01/2023   Treatment plan: Neoadjuvant chemotherapy with Taxotere and Cytoxan every 3 weeks x 4 Breast conserving surgery with targeted node dissection Adjuvant radiation Adjuvant antiestrogen therapy if the final pathology is truly is ER positive. ----------------------------------------------------------------------------------------------------------------------------------- Current treatment: Cycle 2 Taxotere Cytoxan Chemo toxicities: Rash on the left forearm: Mostly improved but slight redness still noticeable.  Using cortisone cream. Fatigue Loss of taste Neutropenia: ANC 0.1: We reduced the dosage of her chemotherapy for cycle 2. Back pain from Neulasta: Instructed to take Tylenol if needed. Return to clinic in 3 weeks for cycle 3    No orders of the defined types were placed in this encounter.  The patient has a good understanding of the overall plan. she agrees with it. she will call with any problems that may develop before the next visit here. Total time spent: 30 mins including face to face time and time spent for planning, charting and co-ordination of care   Tamsen Meek, MD 04/15/23    I Janan Ridge am acting as a Neurosurgeon for [employer]  I have reviewed the  above documentation for accuracy and completeness, and I agree with the above.

## 2023-04-13 ENCOUNTER — Telehealth: Payer: Self-pay

## 2023-04-13 NOTE — Telephone Encounter (Signed)
Returned Pt's cal regarding tape substance over port. Pt states there is stickiness left over from tape above her port and asks if this will affect the lidocaine crm. Advised Pt to take gentle soap and wash cloth to try to remove stickiness, but that it will not affect the use of the crm. Pt asks what to do regarding worsening GERD. Advised Pt to start taking current omeprazole every day instead of every other day and discuss with MD on 04/15/23 Pt verbalized understanding.

## 2023-04-14 MED FILL — Dexamethasone Sodium Phosphate Inj 100 MG/10ML: INTRAMUSCULAR | Qty: 1 | Status: AC

## 2023-04-15 ENCOUNTER — Inpatient Hospital Stay: Payer: Medicare HMO | Attending: Hematology and Oncology | Admitting: Hematology and Oncology

## 2023-04-15 ENCOUNTER — Other Ambulatory Visit: Payer: Medicare HMO

## 2023-04-15 ENCOUNTER — Other Ambulatory Visit: Payer: Self-pay

## 2023-04-15 ENCOUNTER — Inpatient Hospital Stay: Payer: Medicare HMO

## 2023-04-15 ENCOUNTER — Encounter: Payer: Self-pay | Admitting: General Practice

## 2023-04-15 VITALS — BP 131/59 | HR 68 | Temp 97.8°F | Resp 18 | Ht 60.0 in | Wt 114.6 lb

## 2023-04-15 DIAGNOSIS — R5383 Other fatigue: Secondary | ICD-10-CM | POA: Diagnosis not present

## 2023-04-15 DIAGNOSIS — C50411 Malignant neoplasm of upper-outer quadrant of right female breast: Secondary | ICD-10-CM | POA: Insufficient documentation

## 2023-04-15 DIAGNOSIS — Z9221 Personal history of antineoplastic chemotherapy: Secondary | ICD-10-CM | POA: Diagnosis not present

## 2023-04-15 DIAGNOSIS — Z170 Estrogen receptor positive status [ER+]: Secondary | ICD-10-CM | POA: Diagnosis not present

## 2023-04-15 DIAGNOSIS — Z5111 Encounter for antineoplastic chemotherapy: Secondary | ICD-10-CM | POA: Insufficient documentation

## 2023-04-15 DIAGNOSIS — R202 Paresthesia of skin: Secondary | ICD-10-CM | POA: Diagnosis not present

## 2023-04-15 DIAGNOSIS — Z923 Personal history of irradiation: Secondary | ICD-10-CM | POA: Insufficient documentation

## 2023-04-15 DIAGNOSIS — Z79899 Other long term (current) drug therapy: Secondary | ICD-10-CM | POA: Insufficient documentation

## 2023-04-15 DIAGNOSIS — Z803 Family history of malignant neoplasm of breast: Secondary | ICD-10-CM | POA: Diagnosis not present

## 2023-04-15 DIAGNOSIS — Z95828 Presence of other vascular implants and grafts: Secondary | ICD-10-CM | POA: Insufficient documentation

## 2023-04-15 DIAGNOSIS — M549 Dorsalgia, unspecified: Secondary | ICD-10-CM | POA: Diagnosis not present

## 2023-04-15 DIAGNOSIS — R21 Rash and other nonspecific skin eruption: Secondary | ICD-10-CM | POA: Insufficient documentation

## 2023-04-15 DIAGNOSIS — R439 Unspecified disturbances of smell and taste: Secondary | ICD-10-CM | POA: Diagnosis not present

## 2023-04-15 DIAGNOSIS — Z808 Family history of malignant neoplasm of other organs or systems: Secondary | ICD-10-CM | POA: Diagnosis not present

## 2023-04-15 DIAGNOSIS — E785 Hyperlipidemia, unspecified: Secondary | ICD-10-CM | POA: Diagnosis not present

## 2023-04-15 LAB — CBC WITH DIFFERENTIAL (CANCER CENTER ONLY)
Abs Immature Granulocytes: 0.12 10*3/uL — ABNORMAL HIGH (ref 0.00–0.07)
Basophils Absolute: 0.1 10*3/uL (ref 0.0–0.1)
Basophils Relative: 1 %
Eosinophils Absolute: 0 10*3/uL (ref 0.0–0.5)
Eosinophils Relative: 0 %
HCT: 33.4 % — ABNORMAL LOW (ref 36.0–46.0)
Hemoglobin: 10.8 g/dL — ABNORMAL LOW (ref 12.0–15.0)
Immature Granulocytes: 1 %
Lymphocytes Relative: 22 %
Lymphs Abs: 2.4 10*3/uL (ref 0.7–4.0)
MCH: 29 pg (ref 26.0–34.0)
MCHC: 32.3 g/dL (ref 30.0–36.0)
MCV: 89.5 fL (ref 80.0–100.0)
Monocytes Absolute: 1.3 10*3/uL — ABNORMAL HIGH (ref 0.1–1.0)
Monocytes Relative: 12 %
Neutro Abs: 6.8 10*3/uL (ref 1.7–7.7)
Neutrophils Relative %: 64 %
Platelet Count: 291 10*3/uL (ref 150–400)
RBC: 3.73 MIL/uL — ABNORMAL LOW (ref 3.87–5.11)
RDW: 17.7 % — ABNORMAL HIGH (ref 11.5–15.5)
WBC Count: 10.7 10*3/uL — ABNORMAL HIGH (ref 4.0–10.5)
nRBC: 0 % (ref 0.0–0.2)

## 2023-04-15 LAB — CMP (CANCER CENTER ONLY)
ALT: 15 U/L (ref 0–44)
AST: 17 U/L (ref 15–41)
Albumin: 3.9 g/dL (ref 3.5–5.0)
Alkaline Phosphatase: 66 U/L (ref 38–126)
Anion gap: 7 (ref 5–15)
BUN: 15 mg/dL (ref 8–23)
CO2: 25 mmol/L (ref 22–32)
Calcium: 8.8 mg/dL — ABNORMAL LOW (ref 8.9–10.3)
Chloride: 108 mmol/L (ref 98–111)
Creatinine: 0.61 mg/dL (ref 0.44–1.00)
GFR, Estimated: >60 mL/min (ref >60)
Glucose, Bld: 82 mg/dL (ref 70–99)
Potassium: 3.5 mmol/L (ref 3.5–5.1)
Sodium: 140 mmol/L (ref 135–145)
Total Bilirubin: 0.9 mg/dL (ref 0.3–1.2)
Total Protein: 6.1 g/dL — ABNORMAL LOW (ref 6.5–8.1)

## 2023-04-15 MED ORDER — SODIUM CHLORIDE 0.9 % IV SOLN
10.0000 mg | Freq: Once | INTRAVENOUS | Status: AC
  Administered 2023-04-15: 10 mg via INTRAVENOUS
  Filled 2023-04-15: qty 10

## 2023-04-15 MED ORDER — SODIUM CHLORIDE 0.9% FLUSH
10.0000 mL | INTRAVENOUS | Status: DC | PRN
  Administered 2023-04-15: 10 mL

## 2023-04-15 MED ORDER — HEPARIN SOD (PORK) LOCK FLUSH 100 UNIT/ML IV SOLN
500.0000 [IU] | Freq: Once | INTRAVENOUS | Status: AC | PRN
  Administered 2023-04-15: 500 [IU]

## 2023-04-15 MED ORDER — SODIUM CHLORIDE 0.9 % IV SOLN
50.0000 mg/m2 | Freq: Once | INTRAVENOUS | Status: AC
  Administered 2023-04-15: 80 mg via INTRAVENOUS
  Filled 2023-04-15: qty 8

## 2023-04-15 MED ORDER — SODIUM CHLORIDE 0.9 % IV SOLN
400.0000 mg/m2 | Freq: Once | INTRAVENOUS | Status: AC
  Administered 2023-04-15: 600 mg via INTRAVENOUS
  Filled 2023-04-15: qty 30

## 2023-04-15 MED ORDER — SODIUM CHLORIDE 0.9 % IV SOLN: Freq: Once | INTRAVENOUS | Status: AC

## 2023-04-15 MED ORDER — PALONOSETRON HCL INJECTION 0.25 MG/5ML
0.2500 mg | Freq: Once | INTRAVENOUS | Status: AC
  Administered 2023-04-15: 0.25 mg via INTRAVENOUS
  Filled 2023-04-15: qty 5

## 2023-04-15 NOTE — Progress Notes (Signed)
Pt used double tight gloves  pre,  during,  and post  Taxotere infusion.

## 2023-04-15 NOTE — Progress Notes (Signed)
[facility] Spiritual Care Note  Followed up with Heidi Mitchell in infusion. She reported no Spiritual Care concerns at this time, but is aware of ongoing chaplain availability if needs arise or circumstances change. Assisted her with locating the [facility] billing department number per her request.   Chaplain Hillery Aldo, [facility] Pager [phone] Voicemail [phone]

## 2023-04-15 NOTE — Patient Instructions (Signed)
[facility]  Discharge Instructions: Thank you for choosing [facility] to provide your oncology and hematology care.   If you have a lab appointment with the Cancer Center, please go directly to the Cancer Center and check in at the registration area.   Wear comfortable clothing and clothing appropriate for easy access to any Portacath or PICC line.   We strive to give you quality time with your provider. You may need to reschedule your appointment if you arrive late (15 or more minutes).  Arriving late affects you and other patients whose appointments are after yours.  Also, if you miss three or more appointments without notifying the office, you may be dismissed from the clinic at the provider's discretion.      For prescription refill requests, have your pharmacy contact our office and allow 72 hours for refills to be completed.    Today you received the following chemotherapy and/or immunotherapy agents :Taxotere, Cytoxan      To help prevent nausea and vomiting after your treatment, we encourage you to take your nausea medication as directed.  BELOW ARE SYMPTOMS THAT SHOULD BE REPORTED IMMEDIATELY: *FEVER GREATER THAN 100.4 F (38 C) OR HIGHER *CHILLS OR SWEATING *NAUSEA AND VOMITING THAT IS NOT CONTROLLED WITH YOUR NAUSEA MEDICATION *UNUSUAL SHORTNESS OF BREATH *UNUSUAL BRUISING OR BLEEDING *URINARY PROBLEMS (pain or burning when urinating, or frequent urination) *BOWEL PROBLEMS (unusual diarrhea, constipation, pain near the anus) TENDERNESS IN MOUTH AND THROAT WITH OR WITHOUT PRESENCE OF ULCERS (sore throat, sores in mouth, or a toothache) UNUSUAL RASH, SWELLING OR PAIN  UNUSUAL VAGINAL DISCHARGE OR ITCHING   Items with * indicate a potential emergency and should be followed up as soon as possible or go to the Emergency Department if any problems should occur.  Please show the CHEMOTHERAPY ALERT CARD or IMMUNOTHERAPY ALERT CARD  at check-in to the Emergency Department and triage nurse.  Should you have questions after your visit or need to cancel or reschedule your appointment, please contact [facility]  Dept: [phone]  and follow the prompts.  Office hours are 8:00 a.m. to 4:30 p.m. Monday - Friday. Please note that voicemails left after 4:00 p.m. may not be returned until the following business day.  We are closed weekends and major holidays. You have access to a nurse at all times for urgent questions. Please call the main number to the clinic Dept: [phone] and follow the prompts.   For any non-urgent questions, you may also contact your provider using MyChart. We now offer e-Visits for anyone 18 and older to request care online for non-urgent symptoms. For details visit [email or web address].   Also download the MyChart app! Go to the app store, search "MyChart", open the app, select [facility], and log in with your MyChart username and password.   

## 2023-04-15 NOTE — Assessment & Plan Note (Addendum)
03/04/2023:Palpable right breast lump UOQ 12 o'clock position: 1.7 cm by ultrasound, 3 abnormal lymph nodes: 1 biopsy positive with extranodal extension.  Biopsy of the lump: Grade 2 IDC with medullary features ER 60% weak, PR 0%, Ki-67 80%, HER2 negative  03/24/2023: Breast MRI: Biopsy-proven malignancy 1.8 cm, non-mass enhancement measuring 3 cm, biopsy-proven axillary lymph node.  MRI guided biopsy is being planned for 04/01/2023   Treatment plan: Neoadjuvant chemotherapy with Taxotere and Cytoxan every 3 weeks x 4 Breast conserving surgery with targeted node dissection Adjuvant radiation Adjuvant antiestrogen therapy if the final pathology is truly is ER positive. ----------------------------------------------------------------------------------------------------------------------------------- Current treatment: Cycle 2 Taxotere Cytoxan Chemo toxicities: Rash on the left forearm: Mostly improved but slight redness still noticeable.  Using cortisone cream. Fatigue Loss of taste Neutropenia: ANC 0.1: We reduced the dosage of her chemotherapy for cycle 2. Back pain from Neulasta: Instructed to take Tylenol if needed. Return to clinic in 3 weeks for cycle 3

## 2023-04-18 ENCOUNTER — Inpatient Hospital Stay: Payer: Medicare HMO

## 2023-04-18 ENCOUNTER — Other Ambulatory Visit: Payer: Self-pay

## 2023-04-18 VITALS — BP 126/65 | HR 83 | Temp 98.4°F | Resp 16

## 2023-04-18 DIAGNOSIS — C50411 Malignant neoplasm of upper-outer quadrant of right female breast: Secondary | ICD-10-CM | POA: Diagnosis not present

## 2023-04-18 DIAGNOSIS — M549 Dorsalgia, unspecified: Secondary | ICD-10-CM | POA: Diagnosis not present

## 2023-04-18 DIAGNOSIS — Z170 Estrogen receptor positive status [ER+]: Secondary | ICD-10-CM

## 2023-04-18 DIAGNOSIS — R439 Unspecified disturbances of smell and taste: Secondary | ICD-10-CM | POA: Diagnosis not present

## 2023-04-18 DIAGNOSIS — R202 Paresthesia of skin: Secondary | ICD-10-CM | POA: Diagnosis not present

## 2023-04-18 DIAGNOSIS — E785 Hyperlipidemia, unspecified: Secondary | ICD-10-CM | POA: Diagnosis not present

## 2023-04-18 DIAGNOSIS — R5383 Other fatigue: Secondary | ICD-10-CM | POA: Diagnosis not present

## 2023-04-18 DIAGNOSIS — R21 Rash and other nonspecific skin eruption: Secondary | ICD-10-CM | POA: Diagnosis not present

## 2023-04-18 DIAGNOSIS — Z5111 Encounter for antineoplastic chemotherapy: Secondary | ICD-10-CM | POA: Diagnosis not present

## 2023-04-18 MED ORDER — PEGFILGRASTIM-CBQV 6 MG/0.6ML ~~LOC~~ SOSY
6.0000 mg | PREFILLED_SYRINGE | Freq: Once | SUBCUTANEOUS | Status: AC
  Administered 2023-04-18: 6 mg via SUBCUTANEOUS
  Filled 2023-04-18: qty 0.6

## 2023-04-20 ENCOUNTER — Telehealth: Payer: Self-pay

## 2023-04-20 NOTE — Telephone Encounter (Signed)
Pt called and states she has not hade a BM since 8/1 and has been taking a stool softener. She also reports she has been drinking plenty of fluids. Encouraged pt to inset glycerin suppository to help break up stool she can feel in her rectum which she states, "feels like a boulder in there." Patient was advised to try suppository and enema if needed. Advised to avoid digital disimpaction d/t neutropenia. She was also recommended to implement a capful of Miralax daily in combination with stool softener going forward. She verbalized agreement and understanding and knows to call should she experience abd pain/swelling or vomiting.

## 2023-05-04 ENCOUNTER — Telehealth: Payer: Self-pay | Admitting: Hematology and Oncology

## 2023-05-04 NOTE — Telephone Encounter (Signed)
Scheduled appointments per WQ. Patient is aware of the made appointments.  

## 2023-05-05 ENCOUNTER — Encounter: Payer: Self-pay | Admitting: *Deleted

## 2023-05-05 DIAGNOSIS — C50411 Malignant neoplasm of upper-outer quadrant of right female breast: Secondary | ICD-10-CM

## 2023-05-05 MED FILL — Dexamethasone Sodium Phosphate Inj 100 MG/10ML: INTRAMUSCULAR | Qty: 1 | Status: AC

## 2023-05-06 ENCOUNTER — Encounter: Payer: Self-pay | Admitting: Adult Health

## 2023-05-06 ENCOUNTER — Other Ambulatory Visit: Payer: Medicare HMO

## 2023-05-06 ENCOUNTER — Inpatient Hospital Stay: Payer: Medicare HMO | Admitting: Adult Health

## 2023-05-06 ENCOUNTER — Inpatient Hospital Stay: Payer: Medicare HMO

## 2023-05-06 DIAGNOSIS — R21 Rash and other nonspecific skin eruption: Secondary | ICD-10-CM | POA: Diagnosis not present

## 2023-05-06 DIAGNOSIS — Z170 Estrogen receptor positive status [ER+]: Secondary | ICD-10-CM | POA: Diagnosis not present

## 2023-05-06 DIAGNOSIS — C50411 Malignant neoplasm of upper-outer quadrant of right female breast: Secondary | ICD-10-CM | POA: Diagnosis not present

## 2023-05-06 DIAGNOSIS — M549 Dorsalgia, unspecified: Secondary | ICD-10-CM | POA: Diagnosis not present

## 2023-05-06 DIAGNOSIS — Z5111 Encounter for antineoplastic chemotherapy: Secondary | ICD-10-CM | POA: Diagnosis not present

## 2023-05-06 DIAGNOSIS — R439 Unspecified disturbances of smell and taste: Secondary | ICD-10-CM | POA: Diagnosis not present

## 2023-05-06 DIAGNOSIS — R5383 Other fatigue: Secondary | ICD-10-CM | POA: Diagnosis not present

## 2023-05-06 DIAGNOSIS — R202 Paresthesia of skin: Secondary | ICD-10-CM | POA: Diagnosis not present

## 2023-05-06 DIAGNOSIS — Z95828 Presence of other vascular implants and grafts: Secondary | ICD-10-CM

## 2023-05-06 DIAGNOSIS — E785 Hyperlipidemia, unspecified: Secondary | ICD-10-CM | POA: Diagnosis not present

## 2023-05-06 LAB — CBC WITH DIFFERENTIAL (CANCER CENTER ONLY)
Abs Immature Granulocytes: 0.08 10*3/uL — ABNORMAL HIGH (ref 0.00–0.07)
Basophils Absolute: 0.1 10*3/uL (ref 0.0–0.1)
Basophils Relative: 1 %
Eosinophils Absolute: 0 10*3/uL (ref 0.0–0.5)
Eosinophils Relative: 0 %
HCT: 32.6 % — ABNORMAL LOW (ref 36.0–46.0)
Hemoglobin: 10.4 g/dL — ABNORMAL LOW (ref 12.0–15.0)
Immature Granulocytes: 1 %
Lymphocytes Relative: 21 %
Lymphs Abs: 2 10*3/uL (ref 0.7–4.0)
MCH: 30.1 pg (ref 26.0–34.0)
MCHC: 31.9 g/dL (ref 30.0–36.0)
MCV: 94.2 fL (ref 80.0–100.0)
Monocytes Absolute: 1.3 10*3/uL — ABNORMAL HIGH (ref 0.1–1.0)
Monocytes Relative: 14 %
Neutro Abs: 6.2 10*3/uL (ref 1.7–7.7)
Neutrophils Relative %: 63 %
Platelet Count: 245 10*3/uL (ref 150–400)
RBC: 3.46 MIL/uL — ABNORMAL LOW (ref 3.87–5.11)
RDW: 19.8 % — ABNORMAL HIGH (ref 11.5–15.5)
WBC Count: 9.7 10*3/uL (ref 4.0–10.5)
nRBC: 0 % (ref 0.0–0.2)

## 2023-05-06 LAB — CMP (CANCER CENTER ONLY)
ALT: 14 U/L (ref 0–44)
AST: 16 U/L (ref 15–41)
Albumin: 4 g/dL (ref 3.5–5.0)
Alkaline Phosphatase: 72 U/L (ref 38–126)
Anion gap: 5 (ref 5–15)
BUN: 12 mg/dL (ref 8–23)
CO2: 27 mmol/L (ref 22–32)
Calcium: 9.3 mg/dL (ref 8.9–10.3)
Chloride: 107 mmol/L (ref 98–111)
Creatinine: 0.52 mg/dL (ref 0.44–1.00)
GFR, Estimated: >60 mL/min (ref >60)
Glucose, Bld: 99 mg/dL (ref 70–99)
Potassium: 3.8 mmol/L (ref 3.5–5.1)
Sodium: 139 mmol/L (ref 135–145)
Total Bilirubin: 1 mg/dL (ref 0.3–1.2)
Total Protein: 6.4 g/dL — ABNORMAL LOW (ref 6.5–8.1)

## 2023-05-06 MED ORDER — PALONOSETRON HCL INJECTION 0.25 MG/5ML
0.2500 mg | Freq: Once | INTRAVENOUS | Status: AC
  Administered 2023-05-06: 0.25 mg via INTRAVENOUS
  Filled 2023-05-06: qty 5

## 2023-05-06 MED ORDER — SODIUM CHLORIDE 0.9 % IV SOLN: Freq: Once | INTRAVENOUS | Status: AC

## 2023-05-06 MED ORDER — SODIUM CHLORIDE 0.9% FLUSH
10.0000 mL | INTRAVENOUS | Status: DC | PRN
  Administered 2023-05-06: 10 mL

## 2023-05-06 MED ORDER — SODIUM CHLORIDE 0.9 % IV SOLN
10.0000 mg | Freq: Once | INTRAVENOUS | Status: AC
  Administered 2023-05-06: 10 mg via INTRAVENOUS
  Filled 2023-05-06: qty 10

## 2023-05-06 MED ORDER — HEPARIN SOD (PORK) LOCK FLUSH 100 UNIT/ML IV SOLN
500.0000 [IU] | Freq: Once | INTRAVENOUS | Status: AC | PRN
  Administered 2023-05-06: 500 [IU]

## 2023-05-06 MED ORDER — TRIAMCINOLONE ACETONIDE 0.5 % EX OINT: 1.0000 | TOPICAL_OINTMENT | Freq: Two times a day (BID) | CUTANEOUS | 0 refills | Status: DC | PRN

## 2023-05-06 MED ORDER — SODIUM CHLORIDE 0.9 % IV SOLN
400.0000 mg/m2 | Freq: Once | INTRAVENOUS | Status: AC
  Administered 2023-05-06: 600 mg via INTRAVENOUS
  Filled 2023-05-06: qty 30

## 2023-05-06 MED ORDER — SODIUM CHLORIDE 0.9 % IV SOLN
50.0000 mg/m2 | Freq: Once | INTRAVENOUS | Status: AC
  Administered 2023-05-06: 80 mg via INTRAVENOUS
  Filled 2023-05-06: qty 8

## 2023-05-06 NOTE — Progress Notes (Signed)
[facility] Cancer Follow up:    Heidi Schwalbe, MD [address] [place] [address]   DIAGNOSIS:  Cancer Staging  Malignant neoplasm of upper-outer quadrant of right breast in female, estrogen receptor positive (HCC) Staging form: Breast, AJCC 8th Edition - Clinical: Stage IIA (cT1c, cN1, cM0, G2, ER+, PR-, HER2-) - Signed by Serena Croissant, MD on 03/16/2023 Stage prefix: Initial diagnosis Histologic grading system: 3 grade system   SUMMARY OF ONCOLOGIC HISTORY: Oncology History  Malignant neoplasm of upper-outer quadrant of right breast in female, estrogen receptor positive (HCC)  03/04/2023 Initial Diagnosis   Palpable right breast lump UOQ 12 o'clock position: 1.7 cm by ultrasound, 3 abnormal lymph nodes: 1 biopsy positive with extranodal extension.  Biopsy of the lump: Grade 2 IDC with medullary features ER 60% weak, PR 0%, Ki-67 80%, HER2 negative   03/16/2023 Cancer Staging   Staging form: Breast, AJCC 8th Edition - Clinical: Stage IIA (cT1c, cN1, cM0, G2, ER+, PR-, HER2-) - Signed by Serena Croissant, MD on 03/16/2023 Stage prefix: Initial diagnosis Histologic grading system: 3 grade system   03/24/2023 Genetic Testing   Negative [facility] Common Hereditary Cancers +RNA Panel.  Report date is 03/24/2023.    The [facility] Common Hereditary Cancers + RNA Panel includes sequencing, deletion/duplication, and RNA analysis of the following 48 genes: APC, ATM, AXIN2, BAP1, BARD1, BMPR1A, BRCA1, BRCA2, BRIP1, CDH1, CDK4*, CDKN2A*, CHEK2, CTNNA1, DICER1, EPCAM* (del/dup only), FH, GREM1* (promoter dup analysis only), HOXB13*, KIT*, MBD4*, MEN1, MLH1, MSH2, MSH3, MSH6, MUTYH, NF1, NTHL1, PALB2, PDGFRA*, PMS2, POLD1, POLE, PTEN, RAD51C, RAD51D, SDHA (sequencing only), SDHB, SDHC, SDHD, SMAD4, SMARCA4, STK11, TP53, TSC1, TSC2, VHL.  *Genes without RNA analysis.     03/25/2023 -  Chemotherapy   Patient is on Treatment Plan : BREAST TC q21d       CURRENT THERAPY:  Taxotere/Cytoxan  INTERVAL HISTORY: Heidi Mitchell 72 y.o. female returns for follow-up and evaluation prior to receiving cycle 3 of Taxotere Cytoxan.  She is not experiencing any peripheral neuropathy.  She does have a rash that she has noted on her arms and legs.  She notes she will get a red spot that will appear it does not itch but it does somewhat flake off.  She wants to know what to do about this and whether she should be concerned.  There is nothing on her chest or back.  She is otherwise doing well and wants to know when to expect surgery.  She is scheduled for her fourth treatment on September 12 and subsequent postchemotherapy MRI on September 13.   Patient Active Problem List   Diagnosis Date Noted   Port-A-Cath in place 04/15/2023   Genetic testing 03/25/2023   Malignant neoplasm of upper-outer quadrant of right breast in female, estrogen receptor positive (HCC) 03/14/2023   Acute cystitis without hematuria 01/31/2023   Breast mass, right 01/31/2023   Acute non-recurrent frontal sinusitis 10/06/2022   Benign positional vertigo 10/06/2022   Esophageal dysphagia 08/10/2021   Hyperlipidemia, unspecified 07/29/2017    is allergic to sertraline hcl.  MEDICAL HISTORY: Past Medical History:  Diagnosis Date   Hyperlipidemia    Intracervical pessary    Unspecified disorder of bladder    Uterine prolapse     SURGICAL HISTORY: Past Surgical History:  Procedure Laterality Date   APPENDECTOMY  1990   bartholins cyst  1978   BREAST BIOPSY Left    benign   BREAST BIOPSY Right 03/04/2023   [name]  RT BREAST BX W LOC DEV 1ST LESION IMG BX SPEC US GUIDE 03/04/2023 [facility] MAMMOGRAPHY   COLONOSCOPY WITH PROPOFOL N/A 09/27/2018   Procedure: COLONOSCOPY WITH PROPOFOL;  Surgeon: Wyline Mood, MD;  Location: [facility] ENDOSCOPY;  Service: Gastroenterology;  Laterality: N/A;   IR IMAGING GUIDED PORT INSERTION  03/24/2023    SOCIAL HISTORY: Social History   Socioeconomic History   Marital  status: Married    Spouse name: Mitzie Na   Number of children: 1   Years of education: high school   Highest education level: Not on file  Occupational History   Not on file  Tobacco Use   Smoking status: Never    Passive exposure: Current   Smokeless tobacco: Never  Vaping Use   Vaping status: Never Used  Substance and Sexual Activity   Alcohol use: No   Drug use: No   Sexual activity: Not Currently    Birth control/protection: Abstinence  Other Topics Concern   Not on file  Social History Narrative   Does not have living will   Husband and son aware of wishes.   Pt would desires CPR but would not want prolonged life support if futile.   03/06/19   From: the area   Living: husband, Mitzie Na   Work: retired, but finances OK      Family: Son, Marcial Pacas who lives nearby, 2 grandchildren (58 and 54 yo), and a Physicist, medical on the way      Enjoys: fishing, hiking, camping, outdoors activity      Exercise: walking daily - 1.5 mile route that she does   Diet: not great - too much junk food, tries to do calcium      Safety   Seat belts: Yes    Guns: Yes - hunting gun, locked   Safe in relationships: Yes    Social Determinants of Health   Financial Resource Strain: Low Risk  (08/31/2022)   Overall Financial Resource Strain (CARDIA)    Difficulty of Paying Living Expenses: Not hard at all  Food Insecurity: No Food Insecurity (03/23/2023)   Hunger Vital Sign    Worried About Running Out of Food in the Last Year: Never true    Ran Out of Food in the Last Year: Never true  Transportation Needs: No Transportation Needs (03/23/2023)   PRAPARE - Administrator, Civil Service (Medical): No    Lack of Transportation (Non-Medical): No  Physical Activity: Insufficiently Active (08/31/2022)   Exercise Vital Sign    Days of Exercise per Week: 7 days    Minutes of Exercise per Session: 20 min  Stress: No Stress Concern Present (08/31/2022)   [employer] of  Occupational Health - Occupational Stress Questionnaire    Feeling of Stress : Not at all  Social Connections: Moderately Integrated (08/27/2021)   Social Connection and Isolation Panel [NHANES]    Frequency of Communication with Friends and Family: More than three times a week    Frequency of Social Gatherings with Friends and Family: More than three times a week    Attends Religious Services: 1 to 4 times per year    Active Member of [employer] or Organizations: No    Attends Banker Meetings: Never    Marital Status: Married  Catering manager Violence: Not At Risk (03/23/2023)   Humiliation, Afraid, Rape, and Kick questionnaire    Fear of Current or Ex-Partner: No    Emotionally Abused: No    Physically Abused: No    Sexually  Abused: No    FAMILY HISTORY: Family History  Problem Relation Age of Onset   Alcohol abuse Mother    Cervical cancer Sister 44   Breast cancer Maternal Aunt        dx 46s   Other Son        hole in his brain due to been dropped when he was born    Review of Systems  Constitutional:  Negative for appetite change, chills, fatigue, fever and unexpected weight change.  HENT:   Negative for hearing loss, lump/mass and trouble swallowing.   Eyes:  Negative for eye problems and icterus.  Respiratory:  Negative for chest tightness, cough and shortness of breath.   Cardiovascular:  Negative for chest pain, leg swelling and palpitations.  Gastrointestinal:  Negative for abdominal distention, abdominal pain, constipation, diarrhea, nausea and vomiting.  Endocrine: Negative for hot flashes.  Genitourinary:  Negative for difficulty urinating.   Musculoskeletal:  Negative for arthralgias.  Skin:  Positive for rash. Negative for itching.  Neurological:  Negative for dizziness, extremity weakness, headaches and numbness.  Hematological:  Negative for adenopathy. Does not bruise/bleed easily.  Psychiatric/Behavioral:  Negative for depression. The patient is  not nervous/anxious.       PHYSICAL EXAMINATION   Onc Performance Status - 05/06/23 0900       ECOG Perf Status   ECOG Perf Status Fully active, able to carry on all pre-disease performance without restriction      KPS SCALE   KPS % SCORE Able to carry on normal activity, minor s/s of disease             Vitals:   05/06/23 0941  BP: 136/65  Pulse: 68  Resp: 18  Temp: 97.9 F (36.6 C)  SpO2: 99%    Physical Exam Constitutional:      General: She is not in acute distress.    Appearance: Normal appearance. She is not toxic-appearing.  HENT:     Head: Normocephalic and atraumatic.     Mouth/Throat:     Mouth: Mucous membranes are moist.     Pharynx: Oropharynx is clear. No oropharyngeal exudate or posterior oropharyngeal erythema.  Eyes:     General: No scleral icterus. Cardiovascular:     Rate and Rhythm: Normal rate and regular rhythm.     Pulses: Normal pulses.     Heart sounds: Normal heart sounds.  Pulmonary:     Effort: Pulmonary effort is normal.     Breath sounds: Normal breath sounds.  Abdominal:     General: Abdomen is flat. Bowel sounds are normal. There is no distension.     Palpations: Abdomen is soft.     Tenderness: There is no abdominal tenderness.  Musculoskeletal:        General: No swelling.     Cervical back: Neck supple.  Lymphadenopathy:     Cervical: No cervical adenopathy.  Skin:    General: Skin is warm and dry.     Findings: Rash present.     Comments: Jalitza has a lot of freckles and she does have a couple of areas of circular erythematous patches.  These appear to be in different stages of resolution and I count about 6 on her body today in both her forearms and her legs.  Neurological:     General: No focal deficit present.     Mental Status: She is alert.  Psychiatric:        Mood and Affect: Mood normal.  Behavior: Behavior normal.     LABORATORY DATA:  CBC    Component Value Date/Time   WBC 9.7 05/06/2023  0922   WBC 6.9 06/03/2022 0920   RBC 3.46 (L) 05/06/2023 0922   HGB 10.4 (L) 05/06/2023 0922   HCT 32.6 (L) 05/06/2023 0922   PLT 245 05/06/2023 0922   MCV 94.2 05/06/2023 0922   MCH 30.1 05/06/2023 0922   MCHC 31.9 05/06/2023 0922   RDW 19.8 (H) 05/06/2023 0922   LYMPHSABS 2.0 05/06/2023 0922   MONOABS 1.3 (H) 05/06/2023 0922   EOSABS 0.0 05/06/2023 0922   BASOSABS 0.1 05/06/2023 0922    CMP     Component Value Date/Time   NA 139 05/06/2023 0922   K 3.8 05/06/2023 0922   CL 107 05/06/2023 0922   CO2 27 05/06/2023 0922   GLUCOSE 99 05/06/2023 0922   BUN 12 05/06/2023 0922   CREATININE 0.52 05/06/2023 0922   CALCIUM 9.3 05/06/2023 0922   PROT 6.4 (L) 05/06/2023 0922   ALBUMIN 4.0 05/06/2023 0922   AST 16 05/06/2023 0922   ALT 14 05/06/2023 0922   ALKPHOS 72 05/06/2023 0922   BILITOT 1.0 05/06/2023 0922   GFRNONAA >60 05/06/2023 0922      ASSESSMENT and THERAPY PLAN:   Malignant neoplasm of upper-outer quadrant of right breast in female, estrogen receptor positive (HCC) 03/04/2023:Palpable right breast lump UOQ 12 o'clock position: 1.7 cm by ultrasound, 3 abnormal lymph nodes: 1 biopsy positive with extranodal extension.  Biopsy of the lump: Grade 2 IDC with medullary features ER 60% weak, PR 0%, Ki-67 80%, HER2 negative  03/24/2023: Breast MRI: Biopsy-proven malignancy 1.8 cm, non-mass enhancement measuring 3 cm, biopsy-proven axillary lymph node.  MRI guided biopsy is being planned for 04/01/2023   Treatment plan: Neoadjuvant chemotherapy with Taxotere and Cytoxan every 3 weeks x 4 Breast conserving surgery with targeted node dissection Adjuvant radiation Adjuvant antiestrogen therapy if the final pathology is truly is ER positive. ----------------------------------------------------------------------------------------------------------------------------------- Current treatment: Cycle 3 Taxotere Cytoxan Chemo toxicities: Rash: Prescribed triamcinolone ointment--does  not appear to be allergy mediated--recommended that she call if her rash worsens at all after her chemotherapy. Fatigue Taste changes Neutropenia: Resolved after dose reduction.  WBC normal today. Back pain from Neulasta: Instructed to take Tylenol if needed. Return to clinic in 3 weeks for cycle 4   All questions were answered. The patient knows to call the clinic with any problems, questions or concerns. We can certainly see the patient much sooner if necessary.  Total encounter time:30 minutes*in face-to-face visit time, chart review, lab review, care coordination, order entry, and documentation of the encounter time.    Lillard Anes, NP 05/06/23 10:36 AM Medical Oncology and Hematology [facility] [address] [place], [place] [address] Tel. [phone]    Fax. [phone]  *Total Encounter Time as defined by the Centers for Medicare and Medicaid Services includes, in addition to the face-to-face time of a patient visit (documented in the note above) non-face-to-face time: obtaining and reviewing outside history, ordering and reviewing medications, tests or procedures, care coordination (communications with other health care professionals or caregivers) and documentation in the medical record.

## 2023-05-06 NOTE — Patient Instructions (Signed)
[facility]  Discharge Instructions: Thank you for choosing [facility] to provide your oncology and hematology care.   If you have a lab appointment with the Cancer Center, please go directly to the Cancer Center and check in at the registration area.   Wear comfortable clothing and clothing appropriate for easy access to any Portacath or PICC line.   We strive to give you quality time with your provider. You may need to reschedule your appointment if you arrive late (15 or more minutes).  Arriving late affects you and other patients whose appointments are after yours.  Also, if you miss three or more appointments without notifying the office, you may be dismissed from the clinic at the provider's discretion.      For prescription refill requests, have your pharmacy contact our office and allow 72 hours for refills to be completed.    Today you received the following chemotherapy and/or immunotherapy agents: Taxotere/Cytoxan.      To help prevent nausea and vomiting after your treatment, we encourage you to take your nausea medication as directed.  BELOW ARE SYMPTOMS THAT SHOULD BE REPORTED IMMEDIATELY: *FEVER GREATER THAN 100.4 F (38 C) OR HIGHER *CHILLS OR SWEATING *NAUSEA AND VOMITING THAT IS NOT CONTROLLED WITH YOUR NAUSEA MEDICATION *UNUSUAL SHORTNESS OF BREATH *UNUSUAL BRUISING OR BLEEDING *URINARY PROBLEMS (pain or burning when urinating, or frequent urination) *BOWEL PROBLEMS (unusual diarrhea, constipation, pain near the anus) TENDERNESS IN MOUTH AND THROAT WITH OR WITHOUT PRESENCE OF ULCERS (sore throat, sores in mouth, or a toothache) UNUSUAL RASH, SWELLING OR PAIN  UNUSUAL VAGINAL DISCHARGE OR ITCHING   Items with * indicate a potential emergency and should be followed up as soon as possible or go to the Emergency Department if any problems should occur.  Please show the CHEMOTHERAPY ALERT CARD or IMMUNOTHERAPY ALERT CARD  at check-in to the Emergency Department and triage nurse.  Should you have questions after your visit or need to cancel or reschedule your appointment, please contact [facility]  Dept: [phone]  and follow the prompts.  Office hours are 8:00 a.m. to 4:30 p.m. Monday - Friday. Please note that voicemails left after 4:00 p.m. may not be returned until the following business day.  We are closed weekends and major holidays. You have access to a nurse at all times for urgent questions. Please call the main number to the clinic Dept: [phone] and follow the prompts.   For any non-urgent questions, you may also contact your provider using MyChart. We now offer e-Visits for anyone 18 and older to request care online for non-urgent symptoms. For details visit [email or web address].   Also download the MyChart app! Go to the app store, search "MyChart", open the app, select , and log in with your MyChart username and password.   

## 2023-05-06 NOTE — Assessment & Plan Note (Signed)
03/04/2023:Palpable right breast lump UOQ 12 o'clock position: 1.7 cm by ultrasound, 3 abnormal lymph nodes: 1 biopsy positive with extranodal extension.  Biopsy of the lump: Grade 2 IDC with medullary features ER 60% weak, PR 0%, Ki-67 80%, HER2 negative  03/24/2023: Breast MRI: Biopsy-proven malignancy 1.8 cm, non-mass enhancement measuring 3 cm, biopsy-proven axillary lymph node.  MRI guided biopsy is being planned for 04/01/2023   Treatment plan: Neoadjuvant chemotherapy with Taxotere and Cytoxan every 3 weeks x 4 Breast conserving surgery with targeted node dissection Adjuvant radiation Adjuvant antiestrogen therapy if the final pathology is truly is ER positive. ----------------------------------------------------------------------------------------------------------------------------------- Current treatment: Cycle 3 Taxotere Cytoxan Chemo toxicities: Rash: Prescribed triamcinolone ointment--does not appear to be allergy mediated--recommended that she call if her rash worsens at all after her chemotherapy. Fatigue Taste changes Neutropenia: Resolved after dose reduction.  WBC normal today. Back pain from Neulasta: Instructed to take Tylenol if needed. Return to clinic in 3 weeks for cycle 4

## 2023-05-09 ENCOUNTER — Inpatient Hospital Stay: Payer: Medicare HMO

## 2023-05-09 DIAGNOSIS — R439 Unspecified disturbances of smell and taste: Secondary | ICD-10-CM | POA: Diagnosis not present

## 2023-05-09 DIAGNOSIS — C50411 Malignant neoplasm of upper-outer quadrant of right female breast: Secondary | ICD-10-CM

## 2023-05-09 DIAGNOSIS — Z5111 Encounter for antineoplastic chemotherapy: Secondary | ICD-10-CM | POA: Diagnosis not present

## 2023-05-09 DIAGNOSIS — M549 Dorsalgia, unspecified: Secondary | ICD-10-CM | POA: Diagnosis not present

## 2023-05-09 DIAGNOSIS — E785 Hyperlipidemia, unspecified: Secondary | ICD-10-CM | POA: Diagnosis not present

## 2023-05-09 DIAGNOSIS — R21 Rash and other nonspecific skin eruption: Secondary | ICD-10-CM | POA: Diagnosis not present

## 2023-05-09 DIAGNOSIS — R202 Paresthesia of skin: Secondary | ICD-10-CM | POA: Diagnosis not present

## 2023-05-09 DIAGNOSIS — Z170 Estrogen receptor positive status [ER+]: Secondary | ICD-10-CM | POA: Diagnosis not present

## 2023-05-09 DIAGNOSIS — R5383 Other fatigue: Secondary | ICD-10-CM | POA: Diagnosis not present

## 2023-05-09 MED ORDER — PEGFILGRASTIM-CBQV 6 MG/0.6ML ~~LOC~~ SOSY
6.0000 mg | PREFILLED_SYRINGE | Freq: Once | SUBCUTANEOUS | Status: AC
  Administered 2023-05-09: 6 mg via SUBCUTANEOUS
  Filled 2023-05-09: qty 0.6

## 2023-05-12 ENCOUNTER — Ambulatory Visit (INDEPENDENT_AMBULATORY_CARE_PROVIDER_SITE_OTHER): Payer: Medicare HMO | Admitting: Internal Medicine

## 2023-05-12 ENCOUNTER — Telehealth: Payer: Self-pay

## 2023-05-12 ENCOUNTER — Encounter: Payer: Self-pay | Admitting: Internal Medicine

## 2023-05-12 VITALS — BP 98/60 | HR 86 | Temp 97.7°F | Ht 60.0 in | Wt 113.0 lb

## 2023-05-12 DIAGNOSIS — N3000 Acute cystitis without hematuria: Secondary | ICD-10-CM

## 2023-05-12 DIAGNOSIS — R300 Dysuria: Secondary | ICD-10-CM

## 2023-05-12 LAB — POC URINALSYSI DIPSTICK (AUTOMATED)
Bilirubin, UA: NEGATIVE
Glucose, UA: POSITIVE — AB
Ketones, UA: NEGATIVE
Nitrite, UA: POSITIVE
Protein, UA: POSITIVE — AB
Spec Grav, UA: 1.015 (ref 1.010–1.025)
Urobilinogen, UA: 0.2 E.U./dL
pH, UA: 5.5 (ref 5.0–8.0)

## 2023-05-12 MED ORDER — NITROFURANTOIN MONOHYD MACRO 100 MG PO CAPS: 100.0000 mg | ORAL_CAPSULE | Freq: Two times a day (BID) | ORAL | 0 refills | Status: DC

## 2023-05-12 NOTE — Assessment & Plan Note (Addendum)
Had some resistance in May---will recheck culture Urinalysis shows 3+ leuks and + nitrites Trial macrobid 100 bid x 7 days Will adjust prn  She will use miralax for her bowels (in case constipation has added to this)

## 2023-05-12 NOTE — Telephone Encounter (Signed)
Pt called and states she is still experiencing constipation. She reports she has not taken Miralax as previously advised. Once again advised pt to use Miralax daily as well as a stool softener to avoid further trauma to her rectum. She reports a BM today but states it was very hard and caused some rectal bleeding. She knows to call back if she has not received relief.

## 2023-05-12 NOTE — Progress Notes (Signed)
Subjective:    Patient ID: Heidi Mitchell, female    DOB: [birth year], 72 y.o.   MRN: [id number]  HPI Here due to urinary symptoms  Had bladder infection in May Rx with septra--was resistant but the symptoms had gone away  Started with dysuria over the past week Now worsening Some vaginal blood--relates to straining (chronic for years) No fever  Current Outpatient Medications on File Prior to Visit  Medication Sig Dispense Refill   atorvastatin (LIPITOR) 10 MG tablet Take 1 tablet (10 mg total) by mouth daily. 90 tablet 1   dexamethasone (DECADRON) 4 MG tablet Take 1 tablet day before chemo and 1 tablet day after chemo with food 8 tablet 0   estradiol (ESTRACE) 0.1 MG/GM vaginal cream Place 1 Applicatorful vaginally at bedtime.     lidocaine-prilocaine (EMLA) cream Apply to affected area once 30 g 3   omeprazole (PRILOSEC) 20 MG capsule Take 1 capsule (20 mg total) by mouth every other day. On an empty stomach 45 capsule 1   ondansetron (ZOFRAN) 8 MG tablet Take 1 tablet (8 mg total) by mouth every 8 (eight) hours as needed for nausea or vomiting. Start on the third day after chemotherapy. 30 tablet 1   prochlorperazine (COMPAZINE) 10 MG tablet Take 1 tablet (10 mg total) by mouth every 6 (six) hours as needed for nausea or vomiting. 30 tablet 1   sodium fluoride (PREVIDENT 5000 PLUS) 1.1 % CREA dental cream Take by mouth.     triamcinolone ointment (KENALOG) 0.5 % Apply 1 Application topically 2 (two) times daily as needed. 30 g 0   No current facility-administered medications on file prior to visit.    Allergies  Allergen Reactions   Sertraline Hcl     Muscle spasms    Past Medical History:  Diagnosis Date   Hyperlipidemia    Intracervical pessary    Unspecified disorder of bladder    Uterine prolapse     Past Surgical History:  Procedure Laterality Date   APPENDECTOMY  1990   bartholins cyst  1978   BREAST BIOPSY Left    benign   BREAST BIOPSY Right 03/04/2023    [name] RT BREAST BX W LOC DEV 1ST LESION IMG BX SPEC US GUIDE 03/04/2023 [facility] MAMMOGRAPHY   COLONOSCOPY WITH PROPOFOL N/A 09/27/2018   Procedure: COLONOSCOPY WITH PROPOFOL;  Surgeon: Wyline Mood, MD;  Location: [facility] ENDOSCOPY;  Service: Gastroenterology;  Laterality: N/A;   IR IMAGING GUIDED PORT INSERTION  03/24/2023    Family History  Problem Relation Age of Onset   Alcohol abuse Mother    Cervical cancer Sister 80   Breast cancer Maternal Aunt        dx 48s   Other Son        hole in his brain due to been dropped when he was born    Social History   Socioeconomic History   Marital status: Married    Spouse name: Mitzie Na   Number of children: 1   Years of education: high school   Highest education level: Not on file  Occupational History   Not on file  Tobacco Use   Smoking status: Never    Passive exposure: Current   Smokeless tobacco: Never  Vaping Use   Vaping status: Never Used  Substance and Sexual Activity   Alcohol use: No   Drug use: No   Sexual activity: Not Currently    Birth control/protection: Abstinence  Other Topics Concern   Not on  file  Social History Narrative   Does not have living will   Husband and son aware of wishes.   Pt would desires CPR but would not want prolonged life support if futile.   03/06/19   From: the area   Living: husband, Mitzie Na   Work: retired, but finances OK      Family: Son, Marcial Pacas who lives nearby, 2 grandchildren (78 and 19 yo), and a Physicist, medical on the way      Enjoys: fishing, hiking, camping, outdoors activity      Exercise: walking daily - 1.5 mile route that she does   Diet: not great - too much junk food, tries to do calcium      Safety   Seat belts: Yes    Guns: Yes - hunting gun, locked   Safe in relationships: Yes    Social Determinants of Health   Financial Resource Strain: Low Risk  (08/31/2022)   Overall Financial Resource Strain (CARDIA)    Difficulty of Paying Living Expenses: Not hard at  all  Food Insecurity: No Food Insecurity (03/23/2023)   Hunger Vital Sign    Worried About Running Out of Food in the Last Year: Never true    Ran Out of Food in the Last Year: Never true  Transportation Needs: No Transportation Needs (03/23/2023)   PRAPARE - Administrator, Civil Service (Medical): No    Lack of Transportation (Non-Medical): No  Physical Activity: Insufficiently Active (08/31/2022)   Exercise Vital Sign    Days of Exercise per Week: 7 days    Minutes of Exercise per Session: 20 min  Stress: No Stress Concern Present (08/31/2022)   [employer] of Occupational Health - Occupational Stress Questionnaire    Feeling of Stress : Not at all  Social Connections: Moderately Integrated (08/27/2021)   Social Connection and Isolation Panel [NHANES]    Frequency of Communication with Friends and Family: More than three times a week    Frequency of Social Gatherings with Friends and Family: More than three times a week    Attends Religious Services: 1 to 4 times per year    Active Member of [employer] or Organizations: No    Attends Banker Meetings: Never    Marital Status: Married  Catering manager Violence: Not At Risk (03/23/2023)   Humiliation, Afraid, Rape, and Kick questionnaire    Fear of Current or Ex-Partner: No    Emotionally Abused: No    Physically Abused: No    Sexually Abused: No   Review of Systems Some back pain after chemo--no change now No N/V Eating okay Some constipation    Objective:   Physical Exam Constitutional:      Appearance: Normal appearance.  Abdominal:     Palpations: Abdomen is soft.     Tenderness: There is no abdominal tenderness. There is no right CVA tenderness or left CVA tenderness.     Comments: No suprapubic tenderness but has "soreness" there  Neurological:     Mental Status: She is alert.            Assessment & Plan:

## 2023-05-12 NOTE — Addendum Note (Signed)
Addended by: Eual Fines on: 05/12/2023 12:45 PM   Modules accepted: Orders

## 2023-05-14 LAB — URINE CULTURE
MICRO NUMBER:: 15399947
SPECIMEN QUALITY:: ADEQUATE

## 2023-05-18 NOTE — Progress Notes (Signed)
Noted Will need reevaluation if ongoing SOB

## 2023-05-23 NOTE — Progress Notes (Signed)
Patient Care Team: Karie Schwalbe, MD as PCP - General (Internal Medicine) Gladys Damme as Consulting Physician Hildred Laser, MD as Referring Physician (Obstetrics and Gynecology) Serena Croissant, MD as Consulting Physician (Hematology and Oncology) Lonie Peak, MD as Attending Physician (Radiation Oncology) [name], MD as Consulting Physician (General Surgery) Donnelly Angelica, RN as Oncology Nurse Navigator Pershing Proud, RN as Oncology Nurse Navigator  DIAGNOSIS: No diagnosis found.  SUMMARY OF ONCOLOGIC HISTORY: Oncology History  Malignant neoplasm of upper-outer quadrant of right breast in female, estrogen receptor positive (HCC)  03/04/2023 Initial Diagnosis   Palpable right breast lump UOQ 12 o'clock position: 1.7 cm by ultrasound, 3 abnormal lymph nodes: 1 biopsy positive with extranodal extension.  Biopsy of the lump: Grade 2 IDC with medullary features ER 60% weak, PR 0%, Ki-67 80%, HER2 negative   03/16/2023 Cancer Staging   Staging form: Breast, AJCC 8th Edition - Clinical: Stage IIA (cT1c, cN1, cM0, G2, ER+, PR-, HER2-) - Signed by Serena Croissant, MD on 03/16/2023 Stage prefix: Initial diagnosis Histologic grading system: 3 grade system   03/24/2023 Genetic Testing   Negative [facility] Common Hereditary Cancers +RNA Panel.  Report date is 03/24/2023.    The [facility] Common Hereditary Cancers + RNA Panel includes sequencing, deletion/duplication, and RNA analysis of the following 48 genes: APC, ATM, AXIN2, BAP1, BARD1, BMPR1A, BRCA1, BRCA2, BRIP1, CDH1, CDK4*, CDKN2A*, CHEK2, CTNNA1, DICER1, EPCAM* (del/dup only), FH, GREM1* (promoter dup analysis only), HOXB13*, KIT*, MBD4*, MEN1, MLH1, MSH2, MSH3, MSH6, MUTYH, NF1, NTHL1, PALB2, PDGFRA*, PMS2, POLD1, POLE, PTEN, RAD51C, RAD51D, SDHA (sequencing only), SDHB, SDHC, SDHD, SMAD4, SMARCA4, STK11, TP53, TSC1, TSC2, VHL.  *Genes without RNA analysis.     03/25/2023 -  Chemotherapy   Patient is on Treatment Plan :  BREAST TC q21d       CHIEF COMPLIANT: Taxotere Cytoxan cycle 3    INTERVAL HISTORY: Heidi Mitchell is a 72 y.o. female is here because of recent diagnosis of right breast cancer. She presents to the clinic for a follow-up.    ALLERGIES:  is allergic to sertraline hcl.  MEDICATIONS:  Current Outpatient Medications  Medication Sig Dispense Refill   atorvastatin (LIPITOR) 10 MG tablet Take 1 tablet (10 mg total) by mouth daily. 90 tablet 1   dexamethasone (DECADRON) 4 MG tablet Take 1 tablet day before chemo and 1 tablet day after chemo with food 8 tablet 0   estradiol (ESTRACE) 0.1 MG/GM vaginal cream Place 1 Applicatorful vaginally at bedtime.     lidocaine-prilocaine (EMLA) cream Apply to affected area once 30 g 3   nitrofurantoin, macrocrystal-monohydrate, (MACROBID) 100 MG capsule Take 1 capsule (100 mg total) by mouth 2 (two) times daily. 14 capsule 0   omeprazole (PRILOSEC) 20 MG capsule Take 1 capsule (20 mg total) by mouth every other day. On an empty stomach 45 capsule 1   ondansetron (ZOFRAN) 8 MG tablet Take 1 tablet (8 mg total) by mouth every 8 (eight) hours as needed for nausea or vomiting. Start on the third day after chemotherapy. 30 tablet 1   prochlorperazine (COMPAZINE) 10 MG tablet Take 1 tablet (10 mg total) by mouth every 6 (six) hours as needed for nausea or vomiting. 30 tablet 1   sodium fluoride (PREVIDENT 5000 PLUS) 1.1 % CREA dental cream Take by mouth.     triamcinolone ointment (KENALOG) 0.5 % Apply 1 Application topically 2 (two) times daily as needed. 30 g 0   No current facility-administered medications for  this visit.    PHYSICAL EXAMINATION: ECOG PERFORMANCE STATUS: {CHL ONC ECOG [phone]}  There were no vitals filed for this visit. There were no vitals filed for this visit.  BREAST:*** No palpable masses or nodules in either right or left breasts. No palpable axillary supraclavicular or infraclavicular adenopathy no breast tenderness or nipple  discharge. (exam performed in the presence of a chaperone)  LABORATORY DATA:  I have reviewed the data as listed    Latest Ref Rng & Units 05/06/2023    9:22 AM 04/15/2023    8:16 AM 03/31/2023   10:58 AM  CMP  Glucose 70 - 99 mg/dL 99  82  90   BUN 8 - 23 mg/dL 12  15  17    Creatinine 0.44 - 1.00 mg/dL 8.29  5.62  1.30   Sodium 135 - 145 mmol/L 139  140  137   Potassium 3.5 - 5.1 mmol/L 3.8  3.5  4.1   Chloride 98 - 111 mmol/L 107  108  105   CO2 22 - 32 mmol/L 27  25  26    Calcium 8.9 - 10.3 mg/dL 9.3  8.8  9.0   Total Protein 6.5 - 8.1 g/dL 6.4  6.1  6.6   Total Bilirubin 0.3 - 1.2 mg/dL 1.0  0.9  3.0   Alkaline Phos 38 - 126 U/L 72  66  54   AST 15 - 41 U/L 16  17  15    ALT 0 - 44 U/L 14  15  11      Lab Results  Component Value Date   WBC 9.7 05/06/2023   HGB 10.4 (L) 05/06/2023   HCT 32.6 (L) 05/06/2023   MCV 94.2 05/06/2023   PLT 245 05/06/2023   NEUTROABS 6.2 05/06/2023    ASSESSMENT & PLAN:  No problem-specific Assessment & Plan notes found for this encounter.    No orders of the defined types were placed in this encounter.  The patient has a good understanding of the overall plan. she agrees with it. she will call with any problems that may develop before the next visit here. Total time spent: 30 mins including face to face time and time spent for planning, charting and co-ordination of care   Sherlyn Lick, CMA 05/23/23    I Janan Ridge am acting as a Neurosurgeon for [employer]  ***

## 2023-05-25 MED FILL — Dexamethasone Sodium Phosphate Inj 100 MG/10ML: INTRAMUSCULAR | Qty: 1 | Status: AC

## 2023-05-26 ENCOUNTER — Encounter: Payer: Self-pay | Admitting: *Deleted

## 2023-05-26 ENCOUNTER — Inpatient Hospital Stay: Payer: Medicare HMO

## 2023-05-26 ENCOUNTER — Inpatient Hospital Stay: Payer: Medicare HMO | Attending: Hematology and Oncology | Admitting: Hematology and Oncology

## 2023-05-26 VITALS — BP 132/58 | HR 62 | Resp 17

## 2023-05-26 VITALS — BP 132/65 | HR 60 | Temp 97.3°F | Resp 18 | Ht 60.0 in | Wt 114.7 lb

## 2023-05-26 DIAGNOSIS — R21 Rash and other nonspecific skin eruption: Secondary | ICD-10-CM | POA: Insufficient documentation

## 2023-05-26 DIAGNOSIS — Z170 Estrogen receptor positive status [ER+]: Secondary | ICD-10-CM | POA: Insufficient documentation

## 2023-05-26 DIAGNOSIS — C50411 Malignant neoplasm of upper-outer quadrant of right female breast: Secondary | ICD-10-CM | POA: Diagnosis not present

## 2023-05-26 DIAGNOSIS — Z95828 Presence of other vascular implants and grafts: Secondary | ICD-10-CM

## 2023-05-26 DIAGNOSIS — R439 Unspecified disturbances of smell and taste: Secondary | ICD-10-CM | POA: Diagnosis not present

## 2023-05-26 DIAGNOSIS — Z79899 Other long term (current) drug therapy: Secondary | ICD-10-CM | POA: Insufficient documentation

## 2023-05-26 DIAGNOSIS — M549 Dorsalgia, unspecified: Secondary | ICD-10-CM | POA: Diagnosis not present

## 2023-05-26 DIAGNOSIS — R5383 Other fatigue: Secondary | ICD-10-CM | POA: Insufficient documentation

## 2023-05-26 DIAGNOSIS — Z5111 Encounter for antineoplastic chemotherapy: Secondary | ICD-10-CM | POA: Diagnosis not present

## 2023-05-26 LAB — CBC WITH DIFFERENTIAL (CANCER CENTER ONLY)
Abs Immature Granulocytes: 0.08 10*3/uL — ABNORMAL HIGH (ref 0.00–0.07)
Basophils Absolute: 0.1 10*3/uL (ref 0.0–0.1)
Basophils Relative: 1 %
Eosinophils Absolute: 0 10*3/uL (ref 0.0–0.5)
Eosinophils Relative: 0 %
HCT: 29.2 % — ABNORMAL LOW (ref 36.0–46.0)
Hemoglobin: 9.4 g/dL — ABNORMAL LOW (ref 12.0–15.0)
Immature Granulocytes: 1 %
Lymphocytes Relative: 21 %
Lymphs Abs: 2 10*3/uL (ref 0.7–4.0)
MCH: 31.5 pg (ref 26.0–34.0)
MCHC: 32.2 g/dL (ref 30.0–36.0)
MCV: 98 fL (ref 80.0–100.0)
Monocytes Absolute: 1 10*3/uL (ref 0.1–1.0)
Monocytes Relative: 10 %
Neutro Abs: 6.5 10*3/uL (ref 1.7–7.7)
Neutrophils Relative %: 67 %
Platelet Count: 220 10*3/uL (ref 150–400)
RBC: 2.98 MIL/uL — ABNORMAL LOW (ref 3.87–5.11)
RDW: 19.9 % — ABNORMAL HIGH (ref 11.5–15.5)
WBC Count: 9.7 10*3/uL (ref 4.0–10.5)
nRBC: 0 % (ref 0.0–0.2)

## 2023-05-26 LAB — CMP (CANCER CENTER ONLY)
ALT: 10 U/L (ref 0–44)
AST: 15 U/L (ref 15–41)
Albumin: 3.9 g/dL (ref 3.5–5.0)
Alkaline Phosphatase: 55 U/L (ref 38–126)
Anion gap: 5 (ref 5–15)
BUN: 14 mg/dL (ref 8–23)
CO2: 26 mmol/L (ref 22–32)
Calcium: 9 mg/dL (ref 8.9–10.3)
Chloride: 108 mmol/L (ref 98–111)
Creatinine: 0.47 mg/dL (ref 0.44–1.00)
GFR, Estimated: >60 mL/min (ref >60)
Glucose, Bld: 90 mg/dL (ref 70–99)
Potassium: 3.7 mmol/L (ref 3.5–5.1)
Sodium: 139 mmol/L (ref 135–145)
Total Bilirubin: 0.8 mg/dL (ref 0.3–1.2)
Total Protein: 6.1 g/dL — ABNORMAL LOW (ref 6.5–8.1)

## 2023-05-26 MED ORDER — SODIUM CHLORIDE 0.9 % IV SOLN
50.0000 mg/m2 | Freq: Once | INTRAVENOUS | Status: AC
  Administered 2023-05-26: 80 mg via INTRAVENOUS
  Filled 2023-05-26: qty 8

## 2023-05-26 MED ORDER — SODIUM CHLORIDE 0.9 % IV SOLN
400.0000 mg/m2 | Freq: Once | INTRAVENOUS | Status: AC
  Administered 2023-05-26: 600 mg via INTRAVENOUS
  Filled 2023-05-26: qty 30

## 2023-05-26 MED ORDER — HEPARIN SOD (PORK) LOCK FLUSH 100 UNIT/ML IV SOLN
500.0000 [IU] | Freq: Once | INTRAVENOUS | Status: AC | PRN
  Administered 2023-05-26: 500 [IU]

## 2023-05-26 MED ORDER — SODIUM CHLORIDE 0.9 % IV SOLN
10.0000 mg | Freq: Once | INTRAVENOUS | Status: AC
  Administered 2023-05-26: 10 mg via INTRAVENOUS
  Filled 2023-05-26: qty 10

## 2023-05-26 MED ORDER — PALONOSETRON HCL INJECTION 0.25 MG/5ML
0.2500 mg | Freq: Once | INTRAVENOUS | Status: AC
  Administered 2023-05-26: 0.25 mg via INTRAVENOUS
  Filled 2023-05-26: qty 5

## 2023-05-26 MED ORDER — SODIUM CHLORIDE 0.9 % IV SOLN: Freq: Once | INTRAVENOUS | Status: AC

## 2023-05-26 MED ORDER — SODIUM CHLORIDE 0.9% FLUSH
10.0000 mL | INTRAVENOUS | Status: DC | PRN
  Administered 2023-05-26: 10 mL

## 2023-05-26 NOTE — Patient Instructions (Signed)
[facility]  Discharge Instructions: Thank you for choosing [facility] to provide your oncology and hematology care.   If you have a lab appointment with the [facility], please go directly to the [facility] and check in at the registration area.   Wear comfortable clothing and clothing appropriate for easy access to any Portacath or PICC line.   We strive to give you quality time with your provider. You may need to reschedule your appointment if you arrive late (15 or more minutes).  Arriving late affects you and other patients whose appointments are after yours.  Also, if you miss three or more appointments without notifying the office, you may be dismissed from the clinic at the provider's discretion.      For prescription refill requests, have your pharmacy contact our office and allow 72 hours for refills to be completed.    Today you received the following chemotherapy and/or immunotherapy agents: Docetaxel and Cytoxan      To help prevent nausea and vomiting after your treatment, we encourage you to take your nausea medication as directed.  BELOW ARE SYMPTOMS THAT SHOULD BE REPORTED IMMEDIATELY: *FEVER GREATER THAN 100.4 F (38 C) OR HIGHER *CHILLS OR SWEATING *NAUSEA AND VOMITING THAT IS NOT CONTROLLED WITH YOUR NAUSEA MEDICATION *UNUSUAL SHORTNESS OF BREATH *UNUSUAL BRUISING OR BLEEDING *URINARY PROBLEMS (pain or burning when urinating, or frequent urination) *BOWEL PROBLEMS (unusual diarrhea, constipation, pain near the anus) TENDERNESS IN MOUTH AND THROAT WITH OR WITHOUT PRESENCE OF ULCERS (sore throat, sores in mouth, or a toothache) UNUSUAL RASH, SWELLING OR PAIN  UNUSUAL VAGINAL DISCHARGE OR ITCHING   Items with * indicate a potential emergency and should be followed up as soon as possible or go to the Emergency Department if any problems should occur.  Please show the CHEMOTHERAPY ALERT CARD or IMMUNOTHERAPY ALERT  CARD at check-in to the Emergency Department and triage nurse.  Should you have questions after your visit or need to cancel or reschedule your appointment, please contact [facility]  Dept: [phone]  and follow the prompts.  Office hours are 8:00 a.m. to 4:30 p.m. Monday - Friday. Please note that voicemails left after 4:00 p.m. may not be returned until the following business day.  We are closed weekends and major holidays. You have access to a nurse at all times for urgent questions. Please call the main number to the clinic Dept: [phone] and follow the prompts.   For any non-urgent questions, you may also contact your provider using MyChart. We now offer e-Visits for anyone 47 and older to request care online for non-urgent symptoms. For details visit [email or web address].   Also download the MyChart app! Go to the app store, search "MyChart", open the app, select [facility], and log in with your MyChart username and password.

## 2023-05-26 NOTE — Assessment & Plan Note (Addendum)
03/04/2023:Palpable right breast lump UOQ 12 o'clock position: 1.7 cm by ultrasound, 3 abnormal lymph nodes: 1 biopsy positive with extranodal extension.  Biopsy of the lump: Grade 2 IDC with medullary features ER 60% weak, PR 0%, Ki-67 80%, HER2 negative  03/24/2023: Breast MRI: Biopsy-proven malignancy 1.8 cm, non-mass enhancement measuring 3 cm, biopsy-proven axillary lymph node.  MRI guided biopsy is being planned for 04/01/2023   Treatment plan: Neoadjuvant chemotherapy with Taxotere and Cytoxan every 3 weeks x 4 Breast conserving surgery with targeted node dissection Adjuvant radiation Adjuvant antiestrogen therapy if the final pathology is truly is ER positive. ----------------------------------------------------------------------------------------------------------------------------------- Current treatment: Cycle 4 Taxotere Cytoxan Chemo toxicities: Rash on the rt forearm: Mostly improved but slight redness still noticeable.  Using cortisone cream. Fatigue Loss of taste Neutropenia: Resolved Back pain from Neulasta: Instructed to take Tylenol if needed.  She will also take magnesium supplement for leg pains.  Will refer the patient for breast surgery with Dr. Magnus Ivan and I will follow her 1 week after surgery to discuss pathology report. I recommended that the port can be removed during surgery.

## 2023-05-27 ENCOUNTER — Ambulatory Visit
Admission: RE | Admit: 2023-05-27 | Discharge: 2023-05-27 | Disposition: A | Discharge status: Home / Self Care | Payer: Medicare HMO | Source: Ambulatory Visit | Attending: Hematology and Oncology | Admitting: Hematology and Oncology

## 2023-05-27 DIAGNOSIS — C50411 Malignant neoplasm of upper-outer quadrant of right female breast: Secondary | ICD-10-CM

## 2023-05-27 DIAGNOSIS — C50911 Malignant neoplasm of unspecified site of right female breast: Secondary | ICD-10-CM | POA: Diagnosis not present

## 2023-05-27 MED ORDER — GADOPICLENOL 0.5 MMOL/ML IV SOLN
5.0000 mL | Freq: Once | INTRAVENOUS | Status: AC | PRN
  Administered 2023-05-27: 5 mL via INTRAVENOUS

## 2023-05-28 ENCOUNTER — Inpatient Hospital Stay: Payer: Medicare HMO

## 2023-05-28 VITALS — BP 123/55 | HR 64 | Temp 97.9°F

## 2023-05-28 DIAGNOSIS — Z170 Estrogen receptor positive status [ER+]: Secondary | ICD-10-CM | POA: Diagnosis not present

## 2023-05-28 DIAGNOSIS — M549 Dorsalgia, unspecified: Secondary | ICD-10-CM | POA: Diagnosis not present

## 2023-05-28 DIAGNOSIS — R439 Unspecified disturbances of smell and taste: Secondary | ICD-10-CM | POA: Diagnosis not present

## 2023-05-28 DIAGNOSIS — Z5111 Encounter for antineoplastic chemotherapy: Secondary | ICD-10-CM | POA: Diagnosis not present

## 2023-05-28 DIAGNOSIS — R5383 Other fatigue: Secondary | ICD-10-CM | POA: Diagnosis not present

## 2023-05-28 DIAGNOSIS — Z79899 Other long term (current) drug therapy: Secondary | ICD-10-CM | POA: Diagnosis not present

## 2023-05-28 DIAGNOSIS — C50411 Malignant neoplasm of upper-outer quadrant of right female breast: Secondary | ICD-10-CM

## 2023-05-28 DIAGNOSIS — R21 Rash and other nonspecific skin eruption: Secondary | ICD-10-CM | POA: Diagnosis not present

## 2023-05-28 MED ORDER — PEGFILGRASTIM-CBQV 6 MG/0.6ML ~~LOC~~ SOSY
6.0000 mg | PREFILLED_SYRINGE | Freq: Once | SUBCUTANEOUS | Status: AC
  Administered 2023-05-28: 6 mg via SUBCUTANEOUS

## 2023-05-28 NOTE — Patient Instructions (Signed)

## 2023-05-30 ENCOUNTER — Encounter: Payer: Self-pay | Admitting: *Deleted

## 2023-06-13 ENCOUNTER — Other Ambulatory Visit: Payer: Self-pay | Admitting: Surgery

## 2023-06-13 DIAGNOSIS — Z853 Personal history of malignant neoplasm of breast: Secondary | ICD-10-CM

## 2023-06-13 DIAGNOSIS — C50911 Malignant neoplasm of unspecified site of right female breast: Secondary | ICD-10-CM | POA: Diagnosis not present

## 2023-06-16 ENCOUNTER — Encounter: Payer: Self-pay | Admitting: *Deleted

## 2023-06-20 ENCOUNTER — Other Ambulatory Visit: Payer: Self-pay | Admitting: Surgery

## 2023-06-20 DIAGNOSIS — H5203 Hypermetropia, bilateral: Secondary | ICD-10-CM | POA: Diagnosis not present

## 2023-06-20 DIAGNOSIS — H259 Unspecified age-related cataract: Secondary | ICD-10-CM | POA: Diagnosis not present

## 2023-06-20 DIAGNOSIS — C50911 Malignant neoplasm of unspecified site of right female breast: Secondary | ICD-10-CM

## 2023-06-20 DIAGNOSIS — H524 Presbyopia: Secondary | ICD-10-CM | POA: Diagnosis not present

## 2023-06-20 DIAGNOSIS — H52223 Regular astigmatism, bilateral: Secondary | ICD-10-CM | POA: Diagnosis not present

## 2023-06-20 DIAGNOSIS — Z853 Personal history of malignant neoplasm of breast: Secondary | ICD-10-CM

## 2023-06-21 ENCOUNTER — Encounter: Payer: Self-pay | Admitting: *Deleted

## 2023-06-21 DIAGNOSIS — Z170 Estrogen receptor positive status [ER+]: Secondary | ICD-10-CM

## 2023-06-22 ENCOUNTER — Telehealth: Payer: Self-pay | Admitting: *Deleted

## 2023-06-22 NOTE — Telephone Encounter (Signed)
Called and spoke with patient to answer some questions she had regarding surgery. She states she has called the surgeon's office several times with no response.  Encouraged her to call should she have any other questions. Patient verbalized understanding.

## 2023-06-27 ENCOUNTER — Encounter (HOSPITAL_BASED_OUTPATIENT_CLINIC_OR_DEPARTMENT_OTHER): Payer: Self-pay | Admitting: Surgery

## 2023-06-28 ENCOUNTER — Telehealth: Payer: Self-pay | Admitting: Hematology and Oncology

## 2023-06-28 MED ORDER — ENSURE PRE-SURGERY PO LIQD: 296.0000 mL | Freq: Once | ORAL | Status: DC

## 2023-06-28 MED ORDER — CHLORHEXIDINE GLUCONATE CLOTH 2 % EX PADS: 6.0000 | MEDICATED_PAD | Freq: Once | CUTANEOUS | Status: DC

## 2023-06-28 NOTE — Progress Notes (Signed)

## 2023-06-28 NOTE — Telephone Encounter (Signed)
Patient is aware of scheduled appointment times.dates regarding follow up visit

## 2023-07-04 ENCOUNTER — Ambulatory Visit
Admission: RE | Admit: 2023-07-04 | Discharge: 2023-07-04 | Disposition: A | Discharge status: Home / Self Care | Payer: Medicare HMO | Source: Ambulatory Visit | Attending: Surgery | Admitting: Surgery

## 2023-07-04 DIAGNOSIS — C50911 Malignant neoplasm of unspecified site of right female breast: Secondary | ICD-10-CM

## 2023-07-04 DIAGNOSIS — Z853 Personal history of malignant neoplasm of breast: Secondary | ICD-10-CM

## 2023-07-04 DIAGNOSIS — R590 Localized enlarged lymph nodes: Secondary | ICD-10-CM | POA: Diagnosis not present

## 2023-07-04 HISTORY — PX: BREAST BIOPSY: SHX20

## 2023-07-04 NOTE — Plan of Care (Signed)
CH[facility] Tonsillectomy/Adenoidectomy, Postoperative PEDS care plan entered in error.

## 2023-07-04 NOTE — H&P (Signed)
PROVIDER: Wayne Both, MD  MRN: [id number] DOB: 12/05/1950   Subjective   Chief Complaint: follow up right breast cancer    History of Present Illness: Heidi Mitchell is a 72 y.o. female who is seen today for for a preoperative visit. She has now undergone neoadjuvant chemotherapy for her right breast cancer positive to lymph nodes. Again, her follow-up MRI showed that the mass and non-mass like enhancement surrounding the right breast cancer has decreased from 3.5 cm to 1.4 cm. The lymph nodes in the axilla now appear normal. She tolerated chemotherapy well. She has no complaints today.    Review of Systems: A complete review of systems was obtained from the patient. I have reviewed this information and discussed as appropriate with the patient. See HPI as well for other ROS.  ROS   Medical History: Past Medical History:  Diagnosis Date  GERD (gastroesophageal reflux disease)  Hyperlipidemia   There is no problem list on file for this patient.  Past Surgical History:  Procedure Laterality Date  APPENDECTOMY    Allergies  Allergen Reactions  Sertraline Hcl Muscle Pain  Muscle spasms   Current Outpatient Medications on File Prior to Visit  Medication Sig Dispense Refill  atorvastatin (LIPITOR) 10 MG tablet Take 1 tablet by mouth once daily  omeprazole (PRILOSEC) 20 MG DR capsule Take by mouth  SODIUM FLUORIDE 5000 PLUS 1.1 % 1 app applied topically once a day (at bedtime) for 30 day(s)  sulfamethoxazole-trimethoprim (BACTRIM DS) 800-160 mg tablet Take 1 tablet by mouth 2 (two) times daily   No current facility-administered medications on file prior to visit.   Family History  Problem Relation Age of Onset  High blood pressure (Hypertension) Sister  Hyperlipidemia (Elevated cholesterol) Sister    Social History   Tobacco Use  Smoking Status Never  Smokeless Tobacco Never    Social History   Socioeconomic History  Marital status: Married  Tobacco  Use  Smoking status: Never  Smokeless tobacco: Never  Vaping Use  Vaping status: Never Used  Substance and Sexual Activity  Alcohol use: Not Currently  Drug use: Not Currently   Social Determinants of Health   Financial Resource Strain: Low Risk (08/31/2022)  Received from [facility]  Overall Financial Resource Strain (CARDIA)  Difficulty of Paying Living Expenses: Not hard at all  Food Insecurity: No Food Insecurity (03/23/2023)  Received from [facility]  Hunger Vital Sign  Worried About Running Out of Food in the Last Year: Never true  Ran Out of Food in the Last Year: Never true  Transportation Needs: No Transportation Needs (03/23/2023)  Received from [facility] - Transportation  Lack of Transportation (Medical): No  Lack of Transportation (Non-Medical): No  Physical Activity: Insufficiently Active (08/31/2022)  Received from [facility]  Exercise Vital Sign  Days of Exercise per Week: 7 days  Minutes of Exercise per Session: 20 min  Stress: No Stress Concern Present (08/31/2022)  Received from [facility] - Occupational Stress Questionnaire  Feeling of Stress : Not at all  Social Connections: Moderately Integrated (08/27/2021)  Received from [facility]  Social Connection and Isolation Panel [NHANES]  Frequency of Communication with Friends and Family: More than three times a week  Frequency of Social Gatherings with Friends and Family: More than three times a week  Attends Religious Services: 1 to 4 times per year  Active Member of [employer] or Organizations: No  Attends Banker Meetings: Never  Marital Status: Married   Objective:   There were no vitals filed for this visit.  There is no height or weight on file to calculate BMI.  Physical Exam   She appears well on exam  I Have a difficult time feeling the mass in the right breast now. There is no axillary adenopathy and no arm swelling. The  nipple areolar complex appears normal  Labs, Imaging and Diagnostic Testing:  I have reviewed her notes from the cancer center as well as her posttreatment MRI  Assessment and Plan:   Diagnoses and all orders for this visit:  Invasive ductal carcinoma of breast, female, right (CMS/HHS-HCC)    She has had significant improvement in her right breast cancer metastatic to the lymph nodes with neoadjuvant chemotherapy. This is based on the follow-up MRI showing a significant decrease in the non-mass like enhancement and now the nodes appear normal. We discussed this in detail. She is a candidate for breast conservation. She would like to proceed with that. I discussed proceeding with a radioactive seed guided right breast lumpectomy with a targeted right axillary lymph node dissection as well as Port-A-Cath removal as the port will no longer be needed. We discussed the risks which includes but is not limited to bleeding, infection, injury to surrounding structures, the need for further surgery if margins are positive or if there is extensive involvement of the lymph nodes, chronic arm swelling, seroma formation, postoperative recovery, etc. She understands and agrees to proceed with surgery which will be scheduled

## 2023-07-05 ENCOUNTER — Ambulatory Visit (HOSPITAL_BASED_OUTPATIENT_CLINIC_OR_DEPARTMENT_OTHER): Payer: Medicare HMO | Admitting: Certified Registered"

## 2023-07-05 ENCOUNTER — Ambulatory Visit
Admission: RE | Admit: 2023-07-05 | Discharge: 2023-07-05 | Disposition: A | Discharge status: Home / Self Care | Payer: Medicare HMO | Source: Ambulatory Visit | Attending: Surgery | Admitting: Surgery

## 2023-07-05 ENCOUNTER — Ambulatory Visit (HOSPITAL_BASED_OUTPATIENT_CLINIC_OR_DEPARTMENT_OTHER)
Admission: RE | Admit: 2023-07-05 | Discharge: 2023-07-05 | Disposition: A | Discharge status: Home / Self Care | Payer: Medicare HMO | Attending: Surgery | Admitting: Surgery

## 2023-07-05 ENCOUNTER — Other Ambulatory Visit: Payer: Self-pay

## 2023-07-05 ENCOUNTER — Encounter (HOSPITAL_BASED_OUTPATIENT_CLINIC_OR_DEPARTMENT_OTHER): Payer: Self-pay | Admitting: Surgery

## 2023-07-05 ENCOUNTER — Encounter (HOSPITAL_BASED_OUTPATIENT_CLINIC_OR_DEPARTMENT_OTHER)
Admission: RE | Disposition: A | Discharge status: Home / Self Care | Payer: Self-pay | Source: Home / Self Care | Attending: Surgery

## 2023-07-05 DIAGNOSIS — C50911 Malignant neoplasm of unspecified site of right female breast: Secondary | ICD-10-CM | POA: Diagnosis not present

## 2023-07-05 DIAGNOSIS — C50411 Malignant neoplasm of upper-outer quadrant of right female breast: Secondary | ICD-10-CM | POA: Insufficient documentation

## 2023-07-05 DIAGNOSIS — C773 Secondary and unspecified malignant neoplasm of axilla and upper limb lymph nodes: Secondary | ICD-10-CM | POA: Diagnosis not present

## 2023-07-05 DIAGNOSIS — Z853 Personal history of malignant neoplasm of breast: Secondary | ICD-10-CM

## 2023-07-05 DIAGNOSIS — Z170 Estrogen receptor positive status [ER+]: Secondary | ICD-10-CM | POA: Insufficient documentation

## 2023-07-05 DIAGNOSIS — Z9221 Personal history of antineoplastic chemotherapy: Secondary | ICD-10-CM | POA: Diagnosis not present

## 2023-07-05 DIAGNOSIS — G8918 Other acute postprocedural pain: Secondary | ICD-10-CM | POA: Diagnosis not present

## 2023-07-05 DIAGNOSIS — R590 Localized enlarged lymph nodes: Secondary | ICD-10-CM | POA: Diagnosis not present

## 2023-07-05 DIAGNOSIS — Z452 Encounter for adjustment and management of vascular access device: Secondary | ICD-10-CM | POA: Insufficient documentation

## 2023-07-05 DIAGNOSIS — Z1722 Progesterone receptor negative status: Secondary | ICD-10-CM | POA: Diagnosis not present

## 2023-07-05 HISTORY — PX: BREAST LUMPECTOMY WITH RADIOACTIVE SEED AND AXILLARY LYMPH NODE DISSECTION: SHX6656

## 2023-07-05 HISTORY — DX: Gastro-esophageal reflux disease without esophagitis: K21.9

## 2023-07-05 HISTORY — PX: PORT-A-CATH REMOVAL: SHX5289

## 2023-07-05 SURGERY — BREAST LUMPECTOMY WITH RADIOACTIVE SEED AND AXILLARY LYMPH NODE DISSECTION
Anesthesia: General | Site: Chest | Laterality: Right

## 2023-07-05 MED ORDER — CEFAZOLIN SODIUM-DEXTROSE 2-4 GM/100ML-% IV SOLN
2.0000 g | INTRAVENOUS | Status: AC
  Administered 2023-07-05: 2 g via INTRAVENOUS

## 2023-07-05 MED ORDER — LIDOCAINE HCL (PF) 1 % IJ SOLN
INTRAMUSCULAR | Status: AC
  Filled 2023-07-05: qty 30

## 2023-07-05 MED ORDER — ONDANSETRON HCL 4 MG/2ML IJ SOLN
INTRAMUSCULAR | Status: DC | PRN
  Administered 2023-07-05: 4 mg via INTRAVENOUS

## 2023-07-05 MED ORDER — FENTANYL CITRATE (PF) 100 MCG/2ML IJ SOLN
50.0000 ug | Freq: Once | INTRAMUSCULAR | Status: AC
  Administered 2023-07-05: 50 ug via INTRAVENOUS

## 2023-07-05 MED ORDER — BUPIVACAINE-EPINEPHRINE 0.5% -1:200000 IJ SOLN
INTRAMUSCULAR | Status: DC | PRN
  Administered 2023-07-05: 16 mL

## 2023-07-05 MED ORDER — OXYCODONE HCL 5 MG PO TABS
5.0000 mg | ORAL_TABLET | Freq: Once | ORAL | Status: AC | PRN
  Administered 2023-07-05: 5 mg via ORAL

## 2023-07-05 MED ORDER — MIDAZOLAM HCL 2 MG/2ML IJ SOLN
INTRAMUSCULAR | Status: AC
  Filled 2023-07-05: qty 2

## 2023-07-05 MED ORDER — ACETAMINOPHEN 10 MG/ML IV SOLN: 1000.0000 mg | Freq: Once | INTRAVENOUS | Status: DC | PRN

## 2023-07-05 MED ORDER — ACETAMINOPHEN 500 MG PO TABS
1000.0000 mg | ORAL_TABLET | ORAL | Status: AC
  Administered 2023-07-05: 500 mg via ORAL

## 2023-07-05 MED ORDER — PHENYLEPHRINE 80 MCG/ML (10ML) SYRINGE FOR IV PUSH (FOR BLOOD PRESSURE SUPPORT)
PREFILLED_SYRINGE | INTRAVENOUS | Status: AC
  Filled 2023-07-05: qty 10

## 2023-07-05 MED ORDER — DEXAMETHASONE SODIUM PHOSPHATE 10 MG/ML IJ SOLN
INTRAMUSCULAR | Status: DC | PRN
  Administered 2023-07-05: 10 mg

## 2023-07-05 MED ORDER — CLONIDINE HCL (ANALGESIA) 100 MCG/ML EP SOLN
EPIDURAL | Status: DC | PRN
  Administered 2023-07-05: 100 ug

## 2023-07-05 MED ORDER — 0.9 % SODIUM CHLORIDE (POUR BTL) OPTIME
TOPICAL | Status: DC | PRN
  Administered 2023-07-05: 200 mL

## 2023-07-05 MED ORDER — MEPERIDINE HCL 25 MG/ML IJ SOLN: 6.2500 mg | INTRAMUSCULAR | Status: DC | PRN

## 2023-07-05 MED ORDER — FENTANYL CITRATE (PF) 100 MCG/2ML IJ SOLN
INTRAMUSCULAR | Status: AC
  Filled 2023-07-05: qty 2

## 2023-07-05 MED ORDER — OXYCODONE HCL 5 MG/5ML PO SOLN: 5.0000 mg | Freq: Once | ORAL | Status: AC | PRN

## 2023-07-05 MED ORDER — MAGTRACE LYMPHATIC TRACER
INTRAMUSCULAR | Status: DC | PRN
  Administered 2023-07-05: 2 mL via INTRAMUSCULAR

## 2023-07-05 MED ORDER — TRAMADOL HCL 50 MG PO TABS: 50.0000 mg | ORAL_TABLET | Freq: Four times a day (QID) | ORAL | 0 refills | Status: DC | PRN

## 2023-07-05 MED ORDER — MIDAZOLAM HCL 2 MG/2ML IJ SOLN
1.0000 mg | Freq: Once | INTRAMUSCULAR | Status: AC
  Administered 2023-07-05: 1 mg via INTRAVENOUS

## 2023-07-05 MED ORDER — LIDOCAINE HCL (CARDIAC) PF 100 MG/5ML IV SOSY
PREFILLED_SYRINGE | INTRAVENOUS | Status: DC | PRN
  Administered 2023-07-05: 100 mg via INTRAVENOUS

## 2023-07-05 MED ORDER — EPHEDRINE 5 MG/ML INJ
INTRAVENOUS | Status: AC
  Filled 2023-07-05: qty 5

## 2023-07-05 MED ORDER — IBUPROFEN 100 MG/5ML PO SUSP: 200.0000 mg | Freq: Four times a day (QID) | ORAL | Status: DC | PRN

## 2023-07-05 MED ORDER — EPHEDRINE SULFATE (PRESSORS) 50 MG/ML IJ SOLN
INTRAMUSCULAR | Status: DC | PRN
  Administered 2023-07-05: 5 mg via INTRAVENOUS
  Administered 2023-07-05: 10 mg via INTRAVENOUS
  Administered 2023-07-05 (×2): 5 mg via INTRAVENOUS
  Administered 2023-07-05: 10 mg via INTRAVENOUS

## 2023-07-05 MED ORDER — OXYCODONE HCL 5 MG PO TABS
ORAL_TABLET | ORAL | Status: AC
  Filled 2023-07-05: qty 1

## 2023-07-05 MED ORDER — LIDOCAINE 2% (20 MG/ML) 5 ML SYRINGE
INTRAMUSCULAR | Status: AC
  Filled 2023-07-05: qty 5

## 2023-07-05 MED ORDER — ROPIVACAINE HCL 5 MG/ML IJ SOLN
INTRAMUSCULAR | Status: DC | PRN
  Administered 2023-07-05 (×6): 5 mL via PERINEURAL

## 2023-07-05 MED ORDER — PROPOFOL 10 MG/ML IV BOLUS
INTRAVENOUS | Status: DC | PRN
  Administered 2023-07-05: 150 mg via INTRAVENOUS

## 2023-07-05 MED ORDER — SODIUM BICARBONATE 4.2 % IV SOLN
INTRAVENOUS | Status: AC
  Filled 2023-07-05: qty 10

## 2023-07-05 MED ORDER — PROPOFOL 10 MG/ML IV BOLUS
INTRAVENOUS | Status: AC
  Filled 2023-07-05: qty 20

## 2023-07-05 MED ORDER — PHENYLEPHRINE HCL (PRESSORS) 10 MG/ML IV SOLN
INTRAVENOUS | Status: DC | PRN
  Administered 2023-07-05 (×4): 80 ug via INTRAVENOUS

## 2023-07-05 MED ORDER — FENTANYL CITRATE (PF) 100 MCG/2ML IJ SOLN
INTRAMUSCULAR | Status: DC | PRN
  Administered 2023-07-05: 25 ug via INTRAVENOUS

## 2023-07-05 MED ORDER — IBUPROFEN 200 MG PO TABS: 200.0000 mg | ORAL_TABLET | Freq: Four times a day (QID) | ORAL | Status: DC | PRN

## 2023-07-05 MED ORDER — DEXAMETHASONE SODIUM PHOSPHATE 10 MG/ML IJ SOLN
INTRAMUSCULAR | Status: DC | PRN
  Administered 2023-07-05: 10 mg via INTRAVENOUS

## 2023-07-05 MED ORDER — BUPIVACAINE-EPINEPHRINE (PF) 0.5% -1:200000 IJ SOLN
INTRAMUSCULAR | Status: AC
  Filled 2023-07-05: qty 60

## 2023-07-05 MED ORDER — DEXAMETHASONE SODIUM PHOSPHATE 10 MG/ML IJ SOLN
INTRAMUSCULAR | Status: AC
  Filled 2023-07-05: qty 1

## 2023-07-05 MED ORDER — ONDANSETRON HCL 4 MG/2ML IJ SOLN
INTRAMUSCULAR | Status: AC
  Filled 2023-07-05: qty 2

## 2023-07-05 MED ORDER — ONDANSETRON HCL 4 MG/2ML IJ SOLN: 4.0000 mg | Freq: Once | INTRAMUSCULAR | Status: DC | PRN

## 2023-07-05 MED ORDER — LACTATED RINGERS IV SOLN: INTRAVENOUS | Status: DC

## 2023-07-05 MED ORDER — FENTANYL CITRATE (PF) 100 MCG/2ML IJ SOLN: 25.0000 ug | INTRAMUSCULAR | Status: DC | PRN

## 2023-07-05 MED ORDER — ACETAMINOPHEN 500 MG PO TABS
ORAL_TABLET | ORAL | Status: AC
  Filled 2023-07-05: qty 2

## 2023-07-05 SURGICAL SUPPLY — 43 items
ADH SKN CLS APL DERMABOND .7 (GAUZE/BANDAGES/DRESSINGS) ×4
APL PRP STRL LF DISP 70% ISPRP (MISCELLANEOUS) ×4
APPLIER CLIP 9.375 MED OPEN (MISCELLANEOUS) ×2
APR CLP MED 9.3 20 MLT OPN (MISCELLANEOUS) ×2
BLADE SURG 15 STRL LF DISP TIS (BLADE) ×2 IMPLANT
BLADE SURG 15 STRL SS (BLADE) ×2
CANISTER SUCT 1200ML W/VALVE (MISCELLANEOUS) IMPLANT
CHLORAPREP W/TINT 26 (MISCELLANEOUS) ×2 IMPLANT
CLIP APPLIE 9.375 MED OPEN (MISCELLANEOUS) ×2 IMPLANT
COVER BACK TABLE 60X90IN (DRAPES) ×2 IMPLANT
COVER MAYO STAND STRL (DRAPES) ×2 IMPLANT
COVER PROBE CYLINDRICAL 5X96 (MISCELLANEOUS) ×2 IMPLANT
DERMABOND ADVANCED .7 DNX12 (GAUZE/BANDAGES/DRESSINGS) ×2 IMPLANT
DRAPE LAPAROTOMY 100X72 PEDS (DRAPES) ×2 IMPLANT
DRAPE UTILITY XL STRL (DRAPES) ×2 IMPLANT
ELECT REM PT RETURN 9FT ADLT (ELECTROSURGICAL) ×2
ELECTRODE REM PT RTRN 9FT ADLT (ELECTROSURGICAL) ×2 IMPLANT
GAUZE SPONGE 4X4 12PLY STRL LF (GAUZE/BANDAGES/DRESSINGS) IMPLANT
GLOVE SURG SIGNA 7.5 PF LTX (GLOVE) ×2 IMPLANT
GOWN STRL REUS W/ TWL LRG LVL3 (GOWN DISPOSABLE) ×2 IMPLANT
GOWN STRL REUS W/ TWL XL LVL3 (GOWN DISPOSABLE) ×2 IMPLANT
GOWN STRL REUS W/TWL LRG LVL3 (GOWN DISPOSABLE) ×2
GOWN STRL REUS W/TWL XL LVL3 (GOWN DISPOSABLE) ×2
KIT MARKER MARGIN INK (KITS) ×2 IMPLANT
NDL HYPO 25X1 1.5 SAFETY (NEEDLE) ×2 IMPLANT
NDL SAFETY ECLIPSE 18X1.5 (NEEDLE) ×2 IMPLANT
NEEDLE HYPO 25X1 1.5 SAFETY (NEEDLE) ×2
NS IRRIG 1000ML POUR BTL (IV SOLUTION) ×2 IMPLANT
PACK BASIN DAY SURGERY FS (CUSTOM PROCEDURE TRAY) ×2 IMPLANT
PENCIL SMOKE EVACUATOR (MISCELLANEOUS) ×2 IMPLANT
SLEEVE SCD COMPRESS KNEE MED (STOCKING) ×2 IMPLANT
SPIKE FLUID TRANSFER (MISCELLANEOUS) IMPLANT
SPONGE T-LAP 4X18 ~~LOC~~+RFID (SPONGE) ×2 IMPLANT
SUT MNCRL AB 4-0 PS2 18 (SUTURE) ×2 IMPLANT
SUT VIC AB 3-0 SH 27 (SUTURE) ×4
SUT VIC AB 3-0 SH 27X BRD (SUTURE) ×2 IMPLANT
SYR BULB EAR ULCER 3OZ GRN STR (SYRINGE) IMPLANT
SYR CONTROL 10ML LL (SYRINGE) ×2 IMPLANT
TOWEL GREEN STERILE FF (TOWEL DISPOSABLE) ×2 IMPLANT
TRACER MAGTRACE VIAL (MISCELLANEOUS) IMPLANT
TRAY FAXITRON CT DISP (TRAY / TRAY PROCEDURE) ×2 IMPLANT
TUBE CONNECTING 20X1/4 (TUBING) IMPLANT
YANKAUER SUCT BULB TIP NO VENT (SUCTIONS) IMPLANT

## 2023-07-05 NOTE — Anesthesia Procedure Notes (Signed)
Anesthesia Regional Block: Pectoralis block   Pre-Anesthetic Checklist: , timeout performed,  Correct Patient, Correct Site, Correct Laterality,  Correct Procedure, Correct Position, site marked,  Risks and benefits discussed,  Surgical consent,  Pre-op evaluation,  At surgeon's request and post-op pain management  Laterality: Right and N/A  Prep: chloraprep       Needles:  Injection technique: Single-shot  Needle Type: Echogenic Stimulator Needle     Needle Length: 9cm  Needle Gauge: 20   Needle insertion depth: 1.5 cm   Additional Needles:   Procedures:,,,, ultrasound used (permanent image in chart),,    Narrative:  Start time: 07/05/2023 12:05 PM End time: 07/05/2023 12:14 PM Injection made incrementally with aspirations every 5 mL.  Performed by: Personally  Anesthesiologist: Leilani Able, MD

## 2023-07-05 NOTE — Anesthesia Procedure Notes (Signed)
Procedure Name: LMA Insertion Date/Time: 07/05/2023 12:27 PM  Performed by: Lauralyn Primes, CRNAPre-anesthesia Checklist: Patient identified, Emergency Drugs available, Suction available and Patient being monitored Patient Re-evaluated:Patient Re-evaluated prior to induction Oxygen Delivery Method: Circle system utilized Preoxygenation: Pre-oxygenation with 100% oxygen Induction Type: IV induction Ventilation: Mask ventilation without difficulty LMA: LMA inserted LMA Size: 4.0 Number of attempts: 1 Airway Equipment and Method: Bite block Placement Confirmation: positive ETCO2 Tube secured with: Tape Dental Injury: Teeth and Oropharynx as per pre-operative assessment

## 2023-07-05 NOTE — Anesthesia Preprocedure Evaluation (Signed)
Anesthesia Evaluation  Patient identified by MRN, date of birth, ID band Patient awake    Reviewed: Allergy & Precautions, H&P , NPO status , Patient's Chart, lab work & pertinent test results  Airway Mallampati: II       Dental  (+) Poor Dentition   Pulmonary neg pulmonary ROS   Pulmonary exam normal        Cardiovascular negative cardio ROS Normal cardiovascular exam     Neuro/Psych negative neurological ROS  negative psych ROS   GI/Hepatic Neg liver ROS,GERD  Medicated,,  Endo/Other  negative endocrine ROS    Renal/GU negative Renal ROS  negative genitourinary   Musculoskeletal   Abdominal Normal abdominal exam  (+)   Peds  Hematology   Anesthesia Other Findings     Reproductive/Obstetrics negative OB ROS                             Anesthesia Physical Anesthesia Plan  ASA: II  Anesthesia Plan: General   Post-op Pain Management: Regional block*   Induction: Intravenous  PONV Risk Score and Plan: 3 and Ondansetron, Midazolam and Dexamethasone  Airway Management Planned: LMA  Additional Equipment: None  Intra-op Plan:   Post-operative Plan: Extubation in OR  Informed Consent: I have reviewed the patients History and Physical, chart, labs and discussed the procedure including the risks, benefits and alternatives for the proposed anesthesia with the patient or authorized representative who has indicated his/her understanding and acceptance.     Dental Advisory Given  Plan Discussed with: CRNA  Anesthesia Plan Comments:         Anesthesia Quick Evaluation

## 2023-07-05 NOTE — Transfer of Care (Signed)
Immediate Anesthesia Transfer of Care Note  Patient: Heidi Mitchell  Procedure(s) Performed: RADIOACTIVE SEED GUIDED RIGHT BREAST LUMPECTOMY AND TARGETED RIGHT AXILLARY LYMPH NODE DISSECTION (Right: Breast) REMOVAL PORT-A-CATH (Left: Chest)  Patient Location: PACU  Anesthesia Type:GA combined with regional for post-op pain  Level of Consciousness: drowsy  Airway & Oxygen Therapy: Patient Spontanous Breathing and Patient connected to face mask oxygen  Post-op Assessment: Report given to RN and Post -op Vital signs reviewed and stable  Post vital signs: Reviewed and stable  Last Vitals:  Vitals Value Taken Time  BP 119/59 (76)   Temp    Pulse 70 07/05/23 1352  Resp 14 07/05/23 1352  SpO2 100 % 07/05/23 1352  Vitals shown include unfiled device data.  Last Pain:  Vitals:   07/05/23 1040  TempSrc: Temporal  PainSc: 0-No pain         Complications: No notable events documented.

## 2023-07-05 NOTE — Op Note (Signed)
Heidi Mitchell 07/05/2023   Pre-op Diagnosis: RIGHT BREAST CANCER     Post-op Diagnosis: same  Procedure(s): RADIOACTIVE SEED GUIDED RIGHT BREAST LUMPECTOMY AND TARGETED DEEP RIGHT AXILLARY LYMPH NODE DISSECTION REMOVAL PORT-A-CATH INJECTION OF MAG TRACE FOR LYMPH NODE MAPPING   Surgeon(s): [name], MD  Anesthesia: General  Staff:  Circulator: Hilaria Ota, RN Scrub Person: Rolla Etienne Circulator Assistant: Maryan Rued, RN  Estimated Blood Loss: Minimal               Specimens: sent to path  Indications: This is a 72 year old female who is just finished neoadjuvant chemotherapy for a right breast cancer which was lymph node positive.  She now presents for a radioactive seed guided right breast lumpectomy with targeted lymph node dissection as well as Port-A-Cath removal  Procedure: The patient was brought to the operating identifies the correct patient.  She was placed upon the operating table and general anesthesia was induced.  Under sterile technique I injected mag trace underneath the right nipple areolar complex and then the size the breast.  Her bilateral breast chest and axilla were then prepped and draped in usual sterile fashion.  The patient had a left subclavian Port-A-Cath.  I injected the skin the previous scar with Marcaine and then made a longitudinal incision of his previous car with a scalpel.  I then took the dissection down to the port which was easily identified and removed with the entire length of catheter intact.  I closed the catheter tract with a figure-of-eight 3-0 Vicryl suture.  I then closed subcutaneous tissue with interrupted 3-0 Vicryl sutures and closed the skin with a running 4-0 Monocryl.  Using the neoprobe I then located the radioactive seed in the upper outer quadrant of the right breast.  I anesthetized skin overlying the area with Marcaine and then made a longitudinal incision with a scalpel.  With the aid of the  neoprobe I then dissected widely around the signal using electrocautery.  I took the dissection all way down to the chest wall and inferiorly along the nipple areolar complex.  I then completed the lumpectomy staying widely around the signal from the radioactive seed.  Once the specimen was removed I marked all margins with paint.  An x-ray was performed on the specimen confirming the radioactive seed and previous biopsy clip were in the center of the specimen.  The lumpectomy specimen was then sent to pathology for evaluation  Using the neoprobe I then identified the radioactive seed in the right axilla.  I anesthetized skin with Marcaine and made incision with a scalpel.  I then dissected down into the deep right axillary tissue with electrocautery.  The radioactive seed was located deep to more superficial lymph nodes that were identified with the mag trace probe.  I excised the superficial sentinel nodes with the electrocautery and the aid of surgical clips in 2 separate vessels containing the surrounding fat.  I then was able to identify the targeted lymph node deep to this with the aid of the neoprobe.  As a graft the lymph node the radioactive seed came out of the specimen and was placed in a cup and x-rayed separately.  We then x-rayed all the lymph nodes that were removed together in 1 specimen container and the previous biopsy clip was identified in these nodes.  The nodal packet was then sent to pathology for evaluation.  I achieved hemostasis with the cautery.  I injected the lumpectomy site  further with Marcaine and placed surgical clips around the periphery of the lumpectomy cavity for marking purposes.  I then closed both the breast incision and axillary tissue with interrupted 3-0 Vicryl sutures and a running 4-0 Monocryl.  Dermabond was then applied.  The patient tolerated the procedure well.  All the counts were correct at the end of the procedure.  We then placed her in a breast binder.  She was  then extubated in the operating room and taken in a stable condition to the recovery room.          [name]   Date: 07/05/2023  Time: 1:41 PM

## 2023-07-05 NOTE — Anesthesia Postprocedure Evaluation (Signed)
Anesthesia Post Note  Patient: ZUNAIRA SORDEN  Procedure(s) Performed: RADIOACTIVE SEED GUIDED RIGHT BREAST LUMPECTOMY AND TARGETED RIGHT AXILLARY LYMPH NODE DISSECTION (Right: Breast) REMOVAL PORT-A-CATH (Left: Chest)     Patient location during evaluation: Phase II Anesthesia Type: General Level of consciousness: awake and sedated Pain management: pain level controlled Vital Signs Assessment: post-procedure vital signs reviewed and stable Respiratory status: spontaneous breathing Cardiovascular status: stable Postop Assessment: no apparent nausea or vomiting Anesthetic complications: no  No notable events documented.  Last Vitals:  Vitals:   07/05/23 1430 07/05/23 1441  BP: (!) 112/58 128/65  Pulse: 65 66  Resp: 18 16  Temp:  (!) 36.2 C  SpO2: 94% 94%    Last Pain:  Vitals:   07/05/23 1441  TempSrc:   PainSc: 4                  John F [address]

## 2023-07-05 NOTE — Interval H&P Note (Signed)
History and Physical Interval Note:no change in H and P  07/05/2023 11:57 AM  Heidi Mitchell  has presented today for surgery, with the diagnosis of RIGHT BREAST CANCER.  The various methods of treatment have been discussed with the patient and family. After consideration of risks, benefits and other options for treatment, the patient has consented to  Procedure(s) with comments: RADIOACTIVE SEED GUIDED RIGHT BREAST LUMPECTOMY AND TARGETED RIGHT AXILLARY LYMPH NODE DISSECTION (Right) - LMA PEC BLOCK REMOVAL PORT-A-CATH (N/A) as a surgical intervention.  The patient's history has been reviewed, patient examined, no change in status, stable for surgery.  I have reviewed the patient's chart and labs.  Questions were answered to the patient's satisfaction.     [name]

## 2023-07-05 NOTE — Discharge Instructions (Addendum)
Post Anesthesia Home Care Instructions  Activity: Get plenty of rest for the remainder of the day. A responsible individual must stay with you for 24 hours following the procedure.  For the next 24 hours, DO NOT: -Drive a car -Advertising copywriter -Drink alcoholic beverages -Take any medication unless instructed by your physician -Make any legal decisions or sign important papers.  Meals: Start with liquid foods such as gelatin or soup. Progress to regular foods as tolerated. Avoid greasy, spicy, heavy foods. If nausea and/or vomiting occur, drink only clear liquids until the nausea and/or vomiting subsides. Call your physician if vomiting continues.  Special Instructions/Symptoms: Your throat may feel dry or sore from the anesthesia or the breathing tube placed in your throat during surgery. If this causes discomfort, gargle with warm salt water. The discomfort should disappear within 24 hours.  If you had a scopolamine patch placed behind your ear for the management of post- operative nausea and/or vomiting:  1. The medication in the patch is effective for 72 hours, after which it should be removed.  Wrap patch in a tissue and discard in the trash. Wash hands thoroughly with soap and water. 2. You may remove the patch earlier than 72 hours if you experience unpleasant side effects which may include dry mouth, dizziness or visual disturbances. 3. Avoid touching the patch. Wash your hands with soap and water after contact with the patch.      Next dose of tylenol may be taken at 5p    [employer] Office Phone Number [phone]  BREAST BIOPSY/ PARTIAL MASTECTOMY: POST OP INSTRUCTIONS  Always review your discharge instruction sheet given to you by the facility where your surgery was performed.  IF YOU HAVE DISABILITY OR FAMILY LEAVE FORMS, YOU MUST BRING THEM TO THE OFFICE FOR PROCESSING.  DO NOT GIVE THEM TO YOUR DOCTOR.  A prescription for pain medication may be  given to you upon discharge.  Take your pain medication as prescribed, if needed.  If narcotic pain medicine is not needed, then you may take acetaminophen (Tylenol) or ibuprofen (Advil) as needed. Take your usually prescribed medications unless otherwise directed If you need a refill on your pain medication, please contact your pharmacy.  They will contact our office to request authorization.  Prescriptions will not be filled after 5pm or on week-ends. You should eat very light the first 24 hours after surgery, such as soup, crackers, pudding, etc.  Resume your normal diet the day after surgery. Most patients will experience some swelling and bruising in the breast.  Ice packs and a good support bra will help.  Swelling and bruising can take several days to resolve.  It is common to experience some constipation if taking pain medication after surgery.  Increasing fluid intake and taking a stool softener will usually help or prevent this problem from occurring.  A mild laxative (Milk of Magnesia or Miralax) should be taken according to package directions if there are no bowel movements after 48 hours. Unless discharge instructions indicate otherwise, you may remove your bandages 24-48 hours after surgery, and you may shower at that time.  You may have steri-strips (small skin tapes) in place directly over the incision.  These strips should be left on the skin for 7-10 days.  If your surgeon used skin glue on the incision, you may shower in 24 hours.  The glue will flake off over the next 2-3 weeks.  Any sutures or staples will be removed at the office  during your follow-up visit. ACTIVITIES:  You may resume regular daily activities (gradually increasing) beginning the next day.  Wearing a good support bra or sports bra minimizes pain and swelling.  You may have sexual intercourse when it is comfortable. You may drive when you no longer are taking prescription pain medication, you can comfortably wear a  seatbelt, and you can safely maneuver your car and apply brakes. RETURN TO WORK:  ______________________________________________________________________________________ Bonita Quin should see your doctor in the office for a follow-up appointment approximately two weeks after your surgery.  Your doctor's nurse will typically make your follow-up appointment when she calls you with your pathology report.  Expect your pathology report 2-3 business days after your surgery.  You may call to check if you do not hear from [name] after three days. OTHER INSTRUCTIONS: _YOU MAY REMOVE THE BINDER AND SHOWER STARTING TOMORROW THEN PUT THE BINDER BACK ON ICE PACK AND TYLENOL ALSO FOR PAIN NO VIGOROUS ACTIVITY FOR ONE WEEK______________________________________________________________________________________________ _____________________________________________________________________________________________________________________________________ _____________________________________________________________________________________________________________________________________ _____________________________________________________________________________________________________________________________________  WHEN TO CALL YOUR DOCTOR: Fever over 101.0 Nausea and/or vomiting. Extreme swelling or bruising. Continued bleeding from incision. Increased pain, redness, or drainage from the incision.  The clinic staff is available to answer your questions during regular business hours.  Please don't hesitate to call and ask to speak to one of the nurses for clinical concerns.  If you have a medical emergency, go to the nearest emergency room or call 911.  A surgeon from [facility] is always on call at the hospital.  For further questions, please visit [email or web address]

## 2023-07-05 NOTE — Progress Notes (Signed)
Assisted Dr. Jillyn Hidden with right, pectoralis, ultrasound guided block. Side rails up, monitors on throughout procedure. See vital signs in flow sheet. Tolerated Procedure well.

## 2023-07-06 ENCOUNTER — Encounter (HOSPITAL_BASED_OUTPATIENT_CLINIC_OR_DEPARTMENT_OTHER): Payer: Self-pay | Admitting: Surgery

## 2023-07-07 DIAGNOSIS — C50911 Malignant neoplasm of unspecified site of right female breast: Secondary | ICD-10-CM | POA: Diagnosis not present

## 2023-07-11 ENCOUNTER — Encounter: Payer: Self-pay | Admitting: *Deleted

## 2023-07-11 LAB — SURGICAL PATHOLOGY

## 2023-07-21 ENCOUNTER — Inpatient Hospital Stay: Payer: Medicare HMO | Attending: Hematology and Oncology | Admitting: Hematology and Oncology

## 2023-07-21 VITALS — BP 133/55 | HR 70 | Temp 97.3°F | Resp 18 | Ht 60.0 in | Wt 114.6 lb

## 2023-07-21 DIAGNOSIS — Z170 Estrogen receptor positive status [ER+]: Secondary | ICD-10-CM | POA: Insufficient documentation

## 2023-07-21 DIAGNOSIS — C50411 Malignant neoplasm of upper-outer quadrant of right female breast: Secondary | ICD-10-CM | POA: Diagnosis not present

## 2023-07-21 NOTE — Progress Notes (Signed)
Patient Care Team: Karie Schwalbe, MD as PCP - General (Internal Medicine) Gladys Damme as Consulting Physician Hildred Laser, MD as Referring Physician (Obstetrics and Gynecology) Serena Croissant, MD as Consulting Physician (Hematology and Oncology) Lonie Peak, MD as Attending Physician (Radiation Oncology) [name], MD as Consulting Physician (General Surgery) Donnelly Angelica, RN as Oncology Nurse Navigator Pershing Proud, RN as Oncology Nurse Navigator  DIAGNOSIS:  Encounter Diagnosis  Name Primary?   Malignant neoplasm of upper-outer quadrant of right breast in female, estrogen receptor positive (HCC) Yes    SUMMARY OF ONCOLOGIC HISTORY: Oncology History  Malignant neoplasm of upper-outer quadrant of right breast in female, estrogen receptor positive (HCC)  03/04/2023 Initial Diagnosis   Palpable right breast lump UOQ 12 o'clock position: 1.7 cm by ultrasound, 3 abnormal lymph nodes: 1 biopsy positive with extranodal extension.  Biopsy of the lump: Grade 2 IDC with medullary features ER 60% weak, PR 0%, Ki-67 80%, HER2 negative   03/16/2023 Cancer Staging   Staging form: Breast, AJCC 8th Edition - Clinical: Stage IIA (cT1c, cN1, cM0, G2, ER+, PR-, HER2-) - Signed by Serena Croissant, MD on 03/16/2023 Stage prefix: Initial diagnosis Histologic grading system: 3 grade system   03/24/2023 Genetic Testing   Negative [facility] Common Hereditary Cancers +RNA Panel.  Report date is 03/24/2023.    The [facility] Common Hereditary Cancers + RNA Panel includes sequencing, deletion/duplication, and RNA analysis of the following 48 genes: APC, ATM, AXIN2, BAP1, BARD1, BMPR1A, BRCA1, BRCA2, BRIP1, CDH1, CDK4*, CDKN2A*, CHEK2, CTNNA1, DICER1, EPCAM* (del/dup only), FH, GREM1* (promoter dup analysis only), HOXB13*, KIT*, MBD4*, MEN1, MLH1, MSH2, MSH3, MSH6, MUTYH, NF1, NTHL1, PALB2, PDGFRA*, PMS2, POLD1, POLE, PTEN, RAD51C, RAD51D, SDHA (sequencing only), SDHB, SDHC, SDHD, SMAD4,  SMARCA4, STK11, TP53, TSC1, TSC2, VHL.  *Genes without RNA analysis.     03/25/2023 - 05/26/2023 Chemotherapy   Patient is on Treatment Plan : BREAST TC q21d x 4 cycles     07/05/2023 Surgery   Right lumpectomy: Grade 2 IDC 1.5 cm, ER 60%, PR 0%, HER2 0, Ki67 80%, 1/9 lymph nodes positive (repeat prognostic panel: ER 0%, PR 0%, Ki67 30%, HER2 1+)      CHIEF COMPLIANT: F/U after breast surgery  HISTORY OF PRESENT ILLNESS:   History of Present Illness   The patient, with a history of breast cancer, presents for a post-operative follow-up. She reports soreness under her arm extending down to her elbow and tingling in her hand, which she attributes to the numbing agent used during surgery. She is informed that her cancer was 1.5 cm in size and of grade 2. The margins were negative, indicating that all the cancerous tissue was successfully removed. This type of cancer is known to metastasize to lymph nodes and potentially the rest of the body, hence the recommendation for chemotherapy. The patient has already completed chemotherapy with Taxotere and Cytoxan, and the only remaining treatment is radiation. She expresses concern about the potential side effects of radiation, particularly its impact on her cognitive function and ability to drive, but is reassured that the main side effects are local redness and tiredness towards the end of the treatment course. She also inquires about the ridges in her fingernails, which are explained as a result of the chemotherapy causing intermittent growth and cessation of growth.         ALLERGIES:  is allergic to sertraline hcl and excedrin tension headache [acetaminophen-caffeine].  MEDICATIONS:  Current Outpatient Medications  Medication Sig Dispense Refill  atorvastatin (LIPITOR) 10 MG tablet Take 1 tablet (10 mg total) by mouth daily. 90 tablet 1   dexamethasone (DECADRON) 4 MG tablet Take 1 tablet day before chemo and 1 tablet day after chemo with food 8  tablet 0   estradiol (ESTRACE) 0.1 MG/GM vaginal cream Place 1 Applicatorful vaginally at bedtime.     omeprazole (PRILOSEC) 20 MG capsule Take 1 capsule (20 mg total) by mouth every other day. On an empty stomach 45 capsule 1   ondansetron (ZOFRAN) 8 MG tablet Take 1 tablet (8 mg total) by mouth every 8 (eight) hours as needed for nausea or vomiting. Start on the third day after chemotherapy. 30 tablet 1   prochlorperazine (COMPAZINE) 10 MG tablet Take 1 tablet (10 mg total) by mouth every 6 (six) hours as needed for nausea or vomiting. 30 tablet 1   sodium fluoride (PREVIDENT 5000 PLUS) 1.1 % CREA dental cream Take by mouth.     traMADol (ULTRAM) 50 MG tablet Take 1 tablet (50 mg total) by mouth every 6 (six) hours as needed for moderate pain (pain score 4-6) or severe pain (pain score 7-10). 20 tablet 0   No current facility-administered medications for this visit.    PHYSICAL EXAMINATION: ECOG PERFORMANCE STATUS: 1 - Symptomatic but completely ambulatory  Vitals:   07/21/23 1324  BP: (!) 133/55  Pulse: 70  Resp: 18  Temp: (!) 97.3 F (36.3 C)  SpO2: 97%   Filed Weights   07/21/23 1324  Weight: 114 lb 9.6 oz (52 kg)      LABORATORY DATA:  I have reviewed the data as listed    Latest Ref Rng & Units 05/26/2023   11:52 AM 05/06/2023    9:22 AM 04/15/2023    8:16 AM  CMP  Glucose 70 - 99 mg/dL 90  99  82   BUN 8 - 23 mg/dL 14  12  15    Creatinine 0.44 - 1.00 mg/dL 1.61  0.96  0.45   Sodium 135 - 145 mmol/L 139  139  140   Potassium 3.5 - 5.1 mmol/L 3.7  3.8  3.5   Chloride 98 - 111 mmol/L 108  107  108   CO2 22 - 32 mmol/L 26  27  25    Calcium 8.9 - 10.3 mg/dL 9.0  9.3  8.8   Total Protein 6.5 - 8.1 g/dL 6.1  6.4  6.1   Total Bilirubin 0.3 - 1.2 mg/dL 0.8  1.0  0.9   Alkaline Phos 38 - 126 U/L 55  72  66   AST 15 - 41 U/L 15  16  17    ALT 0 - 44 U/L 10  14  15      Lab Results  Component Value Date   WBC 9.7 05/26/2023   HGB 9.4 (L) 05/26/2023   HCT 29.2 (L)  05/26/2023   MCV 98.0 05/26/2023   PLT 220 05/26/2023   NEUTROABS 6.5 05/26/2023    ASSESSMENT & PLAN:  Malignant neoplasm of upper-outer quadrant of right breast in female, estrogen receptor positive (HCC) 03/04/2023:Palpable right breast lump UOQ 12 o'clock position: 1.7 cm by ultrasound, 3 abnormal lymph nodes: 1 biopsy positive with extranodal extension.  Biopsy of the lump: Grade 2 IDC with medullary features ER 60% weak, PR 0%, Ki-67 80%, HER2 negative  03/24/2023: Breast MRI: Biopsy-proven malignancy 1.8 cm, non-mass enhancement measuring 3 cm, biopsy-proven axillary lymph node.  MRI guided biopsy is being planned for 04/01/2023   Treatment plan:  Neoadjuvant chemotherapy with Taxotere and Cytoxan every 3 weeks x 4 completed 05/26/2023 Breast conserving surgery with targeted node dissection: 07/05/2023: Grade 2 IDC 1.5 cm, ER 60%, PR 0%, HER2 0, Ki67 80%, 1/9 lymph nodes positive (repeat prognostic panel: ER 0%, PR 0%, Ki67 30%, HER2 1+) Adjuvant radiation No role of adjuvant antiestrogen therapy since she is ER negative ----------------------------------------------------------------------------------------------------------------------------------- Return to clinic after radiation is complete. ------------------------------------- Assessment and Plan    Breast Cancer (Triple Negative) Post-operative status with 1.5 cm tumor removed and negative margins. Grade 2. Estrogen receptor, progesterone receptor, and HER2 negative. One out of nine lymph nodes positive. Completed chemotherapy with Taxotere and Cytoxan. - Proceed with radiation therapy. - Monitor with Gardant Reveal blood test every six months after completion of radiation therapy.  Post-operative symptoms Reports soreness under arm and tingling down to elbow and hand. Likely due to local anesthetic used during surgery. - Expect resolution of symptoms over the next few weeks.  General Health Maintenance - Follow-up after  completion of radiation therapy.         No orders of the defined types were placed in this encounter.  The patient has a good understanding of the overall plan. she agrees with it. she will call with any problems that may develop before the next visit here. Total time spent: 30 mins including face to face time and time spent for planning, charting and co-ordination of care   Tamsen Meek, MD 07/21/23

## 2023-07-21 NOTE — Assessment & Plan Note (Signed)
03/04/2023:Palpable right breast lump UOQ 12 o'clock position: 1.7 cm by ultrasound, 3 abnormal lymph nodes: 1 biopsy positive with extranodal extension.  Biopsy of the lump: Grade 2 IDC with medullary features ER 60% weak, PR 0%, Ki-67 80%, HER2 negative  03/24/2023: Breast MRI: Biopsy-proven malignancy 1.8 cm, non-mass enhancement measuring 3 cm, biopsy-proven axillary lymph node.  MRI guided biopsy is being planned for 04/01/2023   Treatment plan: Neoadjuvant chemotherapy with Taxotere and Cytoxan every 3 weeks x 4 completed 05/26/2023 Breast conserving surgery with targeted node dissection: 07/05/2023: Grade 2 IDC 1.5 cm, ER 60%, PR 0%, HER2 0, Ki67 80%, 1/9 lymph nodes positive (repeat prognostic panel: ER 0%, PR 0%, Ki67 30%, HER2 1+) Adjuvant radiation No role of adjuvant antiestrogen therapy since she is ER negative ----------------------------------------------------------------------------------------------------------------------------------- Return to clinic after radiation is complete.

## 2023-07-22 ENCOUNTER — Telehealth: Payer: Self-pay | Admitting: *Deleted

## 2023-07-22 NOTE — Telephone Encounter (Signed)
Received call from pt to requesting recurrence rate from MD.  Per MD pt has 18% risk of recurrence (78% not likely to have a distant recurrence). Pt educated and verbalized understanding.

## 2023-07-28 ENCOUNTER — Ambulatory Visit: Payer: Medicare HMO | Admitting: Physical Therapy

## 2023-08-01 NOTE — Progress Notes (Signed)
Location of Breast Cancer:       Histology per Pathology Report: ***  Malignant neoplasm of upper-outer quadrant of right breast in female, estrogen receptor positive (HCC) Yes    SURGICAL PATHOLOGY SURGICAL PATHOLOGY CASE: [phone] [phone] PATIENT: Heidi Mitchell Surgical Pathology Report     Clinical History: right breast cancer (cm)     FINAL MICROSCOPIC DIAGNOSIS:  A. BREAST, RIGHT, LUMPECTOMY:      Invasive ductal carcinoma with medullary features, status post neoadjuvant therapy Tumor size: 1.5 cm Histologic grade: grade 2 Ductal carcinoma in situ:  Not identified Margins, invasive:  Negative           Closest, invasive:  0.8 cm from medial margin Lymphovascular invasion:  Not identified Prognostic markers: ER: 60%, positive, weak staining intensity PR: 0%, negative Her2: 0, negative Ki-67: 80% Biopsy site and ribbon-shaped biopsy clip identified Other:  None See oncology table  B. LYMPH NODE, RIGHT AXILLARY, RESECTION:      One of nine lymph nodes, positive for metastatic carcinoma (1/ 9).      Metastatic focus: 1.9 cm.      No extranodal extension identified.  ONCOLOGY TABLE:  INVASIVE CARCINOMA OF THE BREAST, STATUS POST NEOADJUVANT TREATMENT: Resection  Procedure: Lumpectomy Specimen Laterality: Right Histologic Type: Invasive ductal carcinoma with medullary features Histologic Grade:      Glandular (Acinar)/Tubular Differentiation: 3      Nuclear Pleomorphism: 3      Mitotic Rate: 1      Overall Grade: 2 (7/9) Tumor Size: 1.5 cm Ductal Carcinoma In Situ: Not identified Tumor Extent: Limited to breast parenchyma Treatment Effect in the Breast: Probable or definite response to presurgical therapy in the invasive carcinoma Treatment Effect in Lymph Nodes: Probable or definite response to presurgical therapy in metastatic carcinoma Margins: All margins negative for invasive carcinoma      Distance from Closest Margin (mm): 8 mm      Specify  Closest Margin (required only if <10mm): 8 mm from medial margin DCIS Margins: NA      Distance from Closest Margin (mm): NA      Specify Closest Margin (required only if <74mm): NA Regional Lymph Nodes:      Number of Lymph Nodes Examined: 9      Number of Sentinel Nodes Examined: 9      Number of Lymph Nodes with Macrometastases (>2 mm): 1      Number of Lymph Nodes with Micrometastases: 0      Number of Lymph Nodes with Isolated Tumor Cells (=0.2 mm or =200 cells): 0      Size of Largest Metastatic Deposit (millimeters): 19 mm / 1.9 cm      Extranodal Extension: Not identified Distant Metastasis:      Distant Site(s) Involved: Not applicable Breast Prognostic Profile (Pre-neoadjuvant case #: [id number])      Estrogen Receptor: 60%, positive, weak staining intensity      Progesterone Receptor: 0%, negative      Her2: 0, negative      Ki-67: 80%      Repeated on the current case (Block #: A4) and the results are as follows. Residual Cancer Burden (RCB):      Primary Tumor Bed: 15 mm x 13 mm      Overall Cancer Cellularity: 10      Percentage of Cancer that is in Situ: 0      Number of Positive Lymph Nodes: 1      Diameter of Largest Lymph Node metastasis:  19 mm / 1.9 cm      Residual Cancer Burden: 3.131      Residual Cancer Burden Class: RCB-II Pathologic Stage Classification (pTNM, AJCC 8th Edition): ypT1c, ypN1a Representative Tumor Block: A4 Comment(s): Immunohistochemical stain for CK7 was performed on B6 and confirms metastatic carcinoma (v4.5.0.0)    Right Breast, Lumpectomy:  PROGNOSTIC INDICATOR RESULTS:  Immunohistochemical and morphometric analysis performed manually  The tumor cells are NEGATIVE for Her2 (1+).  Estrogen Receptor:       NEGATIVE Progesterone Receptor:   NEGATIVE Proliferation Marker Ki-67:   30%  Comment: The negative hormone receptor study(ies) in the case has an internal positive control.  Reference Range Estrogen and Progesterone  Receptor      Negative  0%      Positive  >1%  All controls stained appropriately.  The breast prognostic markers on part A, lumpectomy is interpreted by Dr. Marlena Clipper.   GROSS DESCRIPTION:  A: Specimen type: Received fresh and placed in formalin at 2:15 PM on 07/05/2023, and labeled right breast lumpectomy Size: 6.5 cm anterior to posterior by 5.3 cm superior to inferior by 2.6 cm medial to lateral Orientation: Specimen is received inked as follows: Anterior- green, inferior- blue, lateral- orange, medial- yellow, posterior- black, superior- red. Localized area: No areas of interest are designated on the specimen.  At the time of gross, the specimen is radiographed, and a radioactive seed was identified and removed per protocol. Cut surface: Yellow lobulated adipose tissue with tan-white fibrous tissue (approximately 30% fibrous).  Silver metallic ribbon-shaped biopsy clip is identified, within a 1.5 x 1.3 x 1.0 cm area of tan-pink, slightly nodular and hemorrhagic fibrous tissue.  Please note the patient is status post neoadjuvant therapy. Margins: Margins are inked as previously stated.  Possible area of interest surrounding the biopsy clip measures 0.8 cm to the medial margin, 1.5 cm to the lateral margin, 1.7 cm to the posterior margin, 1.6 cm to the superior margin, and 1.8 cm to the inferior margin. Prognostic indicators: Obtain from paraffin blocks if needed Block summary: 10 blocks submitted 1 = posterior margin, perpendicular 2 = possible area of interest with medial inferior margins 3 = medial margin 4 and 5 = medial-inferior margins 6 and 7 = lateral margin 8 = superior posterior margins 9 = inferior margin 10 = anterior margin, perpendicular  B: Specimen is received fresh and consists of 10 tan-pink possible lymph nodes, ranging from 0.2 cm to 3.3 x 1.1 x 0.6 cm.  Identified within the surrounding adipose tissue is a silver metallic [employer] biopsy  clip, and a radioactive seed is received in a separate container and removed per protocol.  The possible lymph nodes are entirely submitted in 7 cassettes. 1 = 3 possible lymph nodes 2 = 3 possible lymph nodes 3 = 1 possible lymph node 4 and 5 = 1 possible lymph node bisected, each 6 and 7 = 1 possible, bisected  Lovey Newcomer 07/06/2023)  Final Diagnosis performed by Lance Coon, MD.   Electronically signed 07/08/2023 Technical and / or Professional components performed at [facility]. [facility], [address], [place], [place] [address].  Immunohistochemistry Technical component (if applicable) was performed at [facility]. [address], [address], [place], [place] [address].   IMMUNOHISTOCHEMISTRY DISCLAIMER (if applicable): Some of these immunohistochemical stains may have been developed and the performance characteristics determine by [facility]. Some may not have been cleared or approved by the U.S. Food and Drug Administration. The  FDA has determined that such clearance or approval is not necessary. This test is used for clinical purposes. It should not be regarded as investigational or for research. This laboratory is certified under the Clinical Laboratory Improvement Amendments of 1988 (CLIA-88) as qualified to perform high complexity clinical laboratory testing.  The controls stained appropriately.   IHC stains are performed on formalin fixed, paraffin embedded tissue using a 3,3"diaminobenzidine (DAB) chromogen and Leica Bond Autostainer System. The staining intensity of the nucleus is score manually and is reported as the percentage of tumor cell nuclei demonstrating specific nuclear staining. The specimens are fixed in 10% Neutral Formalin for at least 6 hours and up to 72hrs. These tests are validated on decalcified tissue. Results should be interpreted with caution given the possibility of false negative results on decalcified specimens.  Antibody Clones are as follows ER-clone 79F, PR-clone 16, Ki67- clone MM1. Some of these immunohistochemical stains may have been developed and the performance characteristics determined by [facility]    Receptor Status:ER: 60%, positive, weak staining intensity  PR: 0%, negative  Her2: 0, negative  Ki-67: 80%   Did patient present with symptoms (if so, please note symptoms) or was this found on screening mammography?: ***  Past/Anticipated interventions by surgeon, if any:07/05/23       [name], MD Primary     Procedure Laterality Anesthesia  RADIOACTIVE SEED GUIDED RIGHT BREAST LUMPECTOMY AND TARGETED RIGHT AXILLARY LYMPH NODE DISSECTION       Procedure Laterality Anesthesia  RADIOACTIVE SEED GUIDED RIGHT BREAST LUMPECTOMY AND TARGETED RIGHT AXILLARY LYMPH NODE DISSECTION         Past/Anticipated interventions by medical oncology, if any: Chemotherapy 03/25/23 05/26/2023 Chemotherapy    Patient is on Treatment Plan : BREAST TC q21d x 4 cycles     Treatment plan: Neoadjuvant chemotherapy with Taxotere and Cytoxan every 3 weeks x 4 completed 05/26/2023 Breast conserving surgery with targeted node dissection: 07/05/2023: Grade 2 IDC 1.5 cm, ER 60%, PR 0%, HER2 0, Ki67 80%, 1/9 lymph nodes positive (repeat prognostic panel: ER 0%, PR 0%, Ki67 30%, HER2 1+) Adjuvant radiation No role of adjuvant antiestrogen therapy since she is ER negative -----------------------------------------------------------------------------------------------  Lymphedema issues, if any:  {:18581} {t:21944}   Pain issues, if any:  {:18581} {PAIN DESCRIPTION:21022940}  SAFETY ISSUES: Prior radiation? {:18581} Pacemaker/ICD? {:18581} Possible current pregnancy?{:18581} Is the patient on methotrexate? {:18581}  Current Complaints / other details:  ***

## 2023-08-01 NOTE — Progress Notes (Signed)
Radiation Oncology         [phone] ________________________________  Name: Heidi Mitchell MRN: [id number]  Date: 08/02/2023  DOB: [birth year]  Follow-Up Visit Note  Outpatient  CC: Karie Schwalbe, MD  Serena Croissant, MD  Diagnosis:   [phone]   ICD-10-CM   1. Malignant neoplasm of upper-outer quadrant of right breast in female, estrogen receptor positive (HCC)  C50.411    Z17.0      Stage IIA (ypT1c, pN1, cM0) Right Breast UOQ, Invasive Ductal Carcinoma, ER- / PR- / Her2-, Grade 2: s/p neoadjuvant chemotherapy followed by right breast lumpectomy and right axillary SLN evaluation with one SLN positive for carcinoma    Cancer Staging  Malignant neoplasm of upper-outer quadrant of right breast in female, estrogen receptor positive (HCC) Staging form: Breast, AJCC 8th Edition - Clinical: Stage IIA (cT1c, cN1, cM0, G2, ER+, PR-, HER2-) - Signed by Serena Croissant, MD on 03/16/2023 - Pathologic stage from 08/02/2023: ypT1c, pN1, cM0, G2, ER-, PR-, HER2- - Unsigned   CHIEF COMPLAINT: Here to discuss management of right breast cancer  Narrative:  The patient returns today for follow-up.     On her breast clinic consultation date of 03/16/23, she underwent genetic testing. Results showed no clinically significant variants detected by [facility] genetic testing.   Since her consultation date, she presented for a bilateral breast MRI on 03/24/23 which showed: the biopsy proven malignancy in the 12 o'clock right breast measuring 1.8 cm in the greatest extent, and an area of non-mass enhancement extending linearly from the posterosuperior aspect of this mass (mass and non-mass enhancement collectively measured an area spanning 3.0 cm). No evidence of malignancy was demonstrated in the left breast. The biopsy proven metastatic lymph node and adjacent abnormal lymph nodes were not well demonstrated on this examination.   Of note: A biopsy of the non-mass like enhancement extending from the mass  was scheduled for 04/01/23. However, given that the biopsy clip artifact was located centrally the area, it was felt that tissue sampling of only the posterosuperior aspect of the non-mass-like enhancement was not necessary. In leu of biopsies, wide margins were obtained at the time of her lumpectomy.   Systemic therapy, if applicable, involved (dates and therapy as follows): She has been treated with neoadjuvant chemotherapy consisting of TC x 4 cycles from 03/25/23 through 05/26/23 under the care of Dr. Pamelia Hoit. Chemo toxicities encountered by the patient throughout the course of systemic treatment included: a rash to the right forearm (occurred after her MRI) which she is managing with cortisone cream, fatigue, loss of taste, neutropenia (since resolved) and back pain which was from Neulasta.   Bilateral breast MRI, s/p neoadjuvant chemotherapy, on 05/27/23 demonstrated a significant decrease in size of the biopsy proven malignancy in the superior right breast, and no new findings in the right breast. MRI also showed no evidence axillary lymphadenopathy bilaterally, or evidence of malignancy in the left breast.   She opted to proceed with a right breast lumpectomy and right axillary SLN evaluation on 07/05/23 under the care of Dr. Magnus Ivan. Pathology from the procedure revealed: tumor size of 1.5 cm; histology of grade 2 invasive ductal carcinoma with medullary features; all margins negative for invasive carcinoma; margin status to invasive disease of 8 mm from the medial margin; nodal status of 1/9 right axillary sentinel lymph node excisions positive for metastatic carcinoma (metastatic focus the size of 1.9 cm; negative for extranodal extension) ;  ER status: negative; PR status negative; Proliferation marker  Ki67 at 30%; Her2 status negative; Grade 2.  Based on her negative ER status, adjuvant antiestrogen therapy is not indicated.   Symptomatically, the patient reports: to be doing well overall. She  has occasional right arm tenderness that is controlled with Tylenol. She denies any issues with range of motion or lymphedema.         ALLERGIES:  is allergic to sertraline hcl and excedrin tension headache [acetaminophen-caffeine].  Meds: Current Outpatient Medications  Medication Sig Dispense Refill   atorvastatin (LIPITOR) 10 MG tablet Take 1 tablet (10 mg total) by mouth daily. 90 tablet 1   ondansetron (ZOFRAN) 8 MG tablet Take 1 tablet (8 mg total) by mouth every 8 (eight) hours as needed for nausea or vomiting. Start on the third day after chemotherapy. 30 tablet 1   dexamethasone (DECADRON) 4 MG tablet Take 1 tablet day before chemo and 1 tablet day after chemo with food (Patient not taking: Reported on 08/02/2023) 8 tablet 0   estradiol (ESTRACE) 0.1 MG/GM vaginal cream Place 1 Applicatorful vaginally at bedtime. (Patient not taking: Reported on 08/02/2023)     omeprazole (PRILOSEC) 20 MG capsule Take 1 capsule (20 mg total) by mouth every other day. On an empty stomach (Patient not taking: Reported on 08/02/2023) 45 capsule 1   prochlorperazine (COMPAZINE) 10 MG tablet Take 1 tablet (10 mg total) by mouth every 6 (six) hours as needed for nausea or vomiting. (Patient not taking: Reported on 08/02/2023) 30 tablet 1   sodium fluoride (PREVIDENT 5000 PLUS) 1.1 % CREA dental cream Take by mouth. (Patient not taking: Reported on 08/02/2023)     traMADol (ULTRAM) 50 MG tablet Take 1 tablet (50 mg total) by mouth every 6 (six) hours as needed for moderate pain (pain score 4-6) or severe pain (pain score 7-10). (Patient not taking: Reported on 08/02/2023) 20 tablet 0   No current facility-administered medications for this encounter.    Physical Findings:  weight is 113 lb 9.6 oz (51.5 kg). Her temperature is 97.2 F (36.2 C) (abnormal). Her blood pressure is 140/63 (abnormal) and her pulse is 61. Her respiration is 18 and oxygen saturation is 98%. .     General: Alert and oriented, in no  acute distress HEENT: Head is normocephalic. Extraocular movements are intact. Oropharynx is clear. Neck: Neck is supple, no palpable cervical or supraclavicular lymphadenopathy. Heart: Regular in rate and rhythm with no murmurs, rubs, or gallops. Chest: Clear to auscultation bilaterally, with no rhonchi, wheezes, or rales. Abdomen: Soft, nontender, nondistended, with no rigidity or guarding. Extremities: No cyanosis or edema. Lymphatics: see Neck Exam Musculoskeletal: symmetric strength and muscle tone throughout. Neurologic: No obvious focalities. Speech is fluent.  Psychiatric: Judgment and insight are intact. Affect is appropriate. Breast exam reveals well healed lumpectomy and SLN incisions with residual. Skin edges are well approximated with no signs of infection or delayed wound healing. Scar tissue palpated beneath SLN incision. Mag trace circumferential to the nipple.   Lab Findings: Lab Results  Component Value Date   WBC 9.7 05/26/2023   HGB 9.4 (L) 05/26/2023   HCT 29.2 (L) 05/26/2023   MCV 98.0 05/26/2023   PLT 220 05/26/2023    @LASTCHEMISTRY @  Radiographic Findings: MM Breast Surgical Specimen  Result Date: 07/05/2023 CLINICAL DATA:  Specimen radiograph status post targeted right axillary lymph node dissection. EXAM: SPECIMEN RADIOGRAPH OF THE RIGHT BREAST COMPARISON:  Previous exam(s). FINDINGS: Status post excision of the right axilla. The radioactive seed was imaged alone within a  specimen container. A surgical clip and the spiral shaped HydroMARK clip are present and completely intact within the specimen. These findings were discussed with the OR at 1:30 p.m. IMPRESSION: Specimen radiograph of the right axilla. Electronically Signed   By: Frederico Hamman M.D.   On: 07/05/2023 13:31   MM Breast Surgical Specimen  Result Date: 07/05/2023 CLINICAL DATA:  Specimen radiograph status post right breast lumpectomy. EXAM: SPECIMEN RADIOGRAPH OF THE RIGHT BREAST  COMPARISON:  Previous exam(s). FINDINGS: Status post excision of the right breast. The radioactive seed and biopsy marker clip are present and completely intact within the specimen. These findings were communicated with the OR at 1:02 p.m. IMPRESSION: Specimen radiograph of the right breast. Electronically Signed   By: Frederico Hamman M.D.   On: 07/05/2023 13:03   [name] RT RADIOACTIVE SEED LOC  Result Date: 07/04/2023 CLINICAL DATA:  Patient presents for radioactive seed localization of a right breast malignancy and a metastatic right axillary lymph node prior to surgical excision. EXAM: MAMMOGRAPHIC GUIDED RADIOACTIVE SEED LOCALIZATION OF THE RIGHT BREAST ULTRASOUND-GUIDED RADIOACTIVE SEED LOCALIZATION OF A RIGHT AXILLARY LYMPH NODE COMPARISON:  Previous exam(s). FINDINGS: BREAST MASS Patient presents for radioactive seed localization prior to surgical excision. I met with the patient and we discussed the procedure of seed localization including benefits and alternatives. We discussed the high likelihood of a successful procedure. We discussed the risks of the procedure including infection, bleeding, tissue injury and further surgery. We discussed the low dose of radioactivity involved in the procedure. Informed, written consent was given. The usual time-out protocol was performed immediately prior to the procedure. Using mammographic guidance, sterile technique, 1% lidocaine and an I-125 radioactive seed, the ribbon shaped post biopsy marker clip in the upper outer breast was localized using a superior approach. The follow-up mammogram images confirm the seed in the expected location and were marked for Dr. Magnus Ivan. Follow-up survey of the patient confirms presence of the radioactive seed. Order number of I-125 seed:  [id number]. Total activity:  0.253 millicuries.  Reference Date: 06/06/2023. LYMPH NODE Patient presents for radioactive seed localization prior to surgical excision. I met with the patient and  we discussed the procedure of seed localization including benefits and alternatives. We discussed the high likelihood of a successful procedure. We discussed the risks of the procedure including infection, bleeding, tissue injury and further surgery. We discussed the low dose of radioactivity involved in the procedure. Informed, written consent was given. The usual time-out protocol was performed immediately prior to the procedure. Using ultrasound guidance, sterile technique, 1% lidocaine and an I-125 radioactive seed, the right axillary lymph node was localized using an inferolateral approach. Lymph node could not be visualized mammographically. Follow-up survey of the patient confirms presence of the radioactive seed. Order number of I-125 seed:  [id number]. Total activity:  0.238 millicuries.  Reference Date: 06/06/2023. The patient tolerated the procedure well and was released from the [facility]. She was given instructions regarding seed removal. IMPRESSION: Radioactive seed localization of the right breast and right axilla. No apparent complications. Electronically Signed   By: Amie Portland M.D.   On: 07/04/2023 15:01   MM RT RADIOACTIVE SEED LOC MAMMO GUIDE  Result Date: 07/04/2023 CLINICAL DATA:  Patient presents for radioactive seed localization of a right breast malignancy and a metastatic right axillary lymph node prior to surgical excision. EXAM: MAMMOGRAPHIC GUIDED RADIOACTIVE SEED LOCALIZATION OF THE RIGHT BREAST ULTRASOUND-GUIDED RADIOACTIVE SEED LOCALIZATION OF A RIGHT AXILLARY LYMPH NODE COMPARISON:  Previous exam(s). FINDINGS:  BREAST MASS Patient presents for radioactive seed localization prior to surgical excision. I met with the patient and we discussed the procedure of seed localization including benefits and alternatives. We discussed the high likelihood of a successful procedure. We discussed the risks of the procedure including infection, bleeding, tissue injury and further surgery.  We discussed the low dose of radioactivity involved in the procedure. Informed, written consent was given. The usual time-out protocol was performed immediately prior to the procedure. Using mammographic guidance, sterile technique, 1% lidocaine and an I-125 radioactive seed, the ribbon shaped post biopsy marker clip in the upper outer breast was localized using a superior approach. The follow-up mammogram images confirm the seed in the expected location and were marked for Dr. Magnus Ivan. Follow-up survey of the patient confirms presence of the radioactive seed. Order number of I-125 seed:  [id number]. Total activity:  0.253 millicuries.  Reference Date: 06/06/2023. LYMPH NODE Patient presents for radioactive seed localization prior to surgical excision. I met with the patient and we discussed the procedure of seed localization including benefits and alternatives. We discussed the high likelihood of a successful procedure. We discussed the risks of the procedure including infection, bleeding, tissue injury and further surgery. We discussed the low dose of radioactivity involved in the procedure. Informed, written consent was given. The usual time-out protocol was performed immediately prior to the procedure. Using ultrasound guidance, sterile technique, 1% lidocaine and an I-125 radioactive seed, the right axillary lymph node was localized using an inferolateral approach. Lymph node could not be visualized mammographically. Follow-up survey of the patient confirms presence of the radioactive seed. Order number of I-125 seed:  [id number]. Total activity:  0.238 millicuries.  Reference Date: 06/06/2023. The patient tolerated the procedure well and was released from the [facility]. She was given instructions regarding seed removal. IMPRESSION: Radioactive seed localization of the right breast and right axilla. No apparent complications. Electronically Signed   By: Amie Portland M.D.   On: 07/04/2023 15:01     Impression/Plan: Stage IIA (ypT1c, pN1, cM0) Right Breast UOQ, Invasive Ductal Carcinoma, ER- / PR- / Her2-, Grade 2: s/p neoadjuvant chemotherapy followed by right breast lumpectomy and right axillary SLN evaluation with one SLN positive for carcinoma   Patient is healing well from her surgery. She is scheduled to see Dr. Magnus Ivan for follow-up on 08/04/23. We discussed adjuvant radiotherapy today.  Dr. Basilio Cairo recommends radiation in order to decrease her risk of locoregional disease recurrence.  I reviewed the logistics, benefits, risks, and potential side effects of this treatment in detail. Risks may include but not necessary be limited to acute and late injury tissue in the radiation fields such as skin irritation (change in color/pigmentation, itching, dryness, pain, peeling). She may experience fatigue. We also discussed possible risk of long term cosmetic changes or scar tissue. There is also a smaller risk for lung toxicity, brachial plexopathy, lymphedema, musculoskeletal changes, rib fragility or induction of a second malignancy, late chronic non-healing soft tissue wound.    The patient asked good questions which I answered to her satisfaction. She is enthusiastic about proceeding with treatment. A consent form has been signed and placed in her chart.   The patient will receive 50 Gy in 25 fractions to the right breast, axillary, and supraclavicular nodes. This will be followed by a boost of 10 Gy in 5 fractions to the right breast.   On date of service, in total, I spent 30 minutes on this encounter. Patient was seen in person.  _____________________________________    Bryan Lemma, PA-C   This document serves as a record of services personally performed by Bryan Lemma, PA-C. It was created on her behalf by Neena Rhymes, a trained medical scribe. The creation of this record is based on the scribe's personal observations and the provider's statements to them. This document has been  checked and approved by the attending provider.

## 2023-08-02 ENCOUNTER — Ambulatory Visit
Admission: RE | Admit: 2023-08-02 | Discharge: 2023-08-02 | Disposition: A | Discharge status: Home / Self Care | Payer: Medicare HMO | Source: Ambulatory Visit | Attending: Radiation Oncology | Admitting: Radiation Oncology

## 2023-08-02 ENCOUNTER — Encounter: Payer: Self-pay | Admitting: Radiation Oncology

## 2023-08-02 VITALS — BP 140/63 | HR 61 | Temp 97.2°F | Resp 18 | Wt 113.6 lb

## 2023-08-02 DIAGNOSIS — C50411 Malignant neoplasm of upper-outer quadrant of right female breast: Secondary | ICD-10-CM | POA: Diagnosis not present

## 2023-08-02 DIAGNOSIS — Z510 Encounter for antineoplastic radiation therapy: Secondary | ICD-10-CM | POA: Diagnosis not present

## 2023-08-02 DIAGNOSIS — Z79899 Other long term (current) drug therapy: Secondary | ICD-10-CM | POA: Insufficient documentation

## 2023-08-02 DIAGNOSIS — Z170 Estrogen receptor positive status [ER+]: Secondary | ICD-10-CM

## 2023-08-02 DIAGNOSIS — Z171 Estrogen receptor negative status [ER-]: Secondary | ICD-10-CM | POA: Diagnosis not present

## 2023-08-03 ENCOUNTER — Ambulatory Visit: Payer: Medicare HMO | Admitting: Family Medicine

## 2023-08-03 ENCOUNTER — Encounter: Payer: Self-pay | Admitting: Family Medicine

## 2023-08-03 VITALS — BP 120/70 | HR 70 | Temp 100.1°F | Ht 60.0 in | Wt 115.4 lb

## 2023-08-03 DIAGNOSIS — N390 Urinary tract infection, site not specified: Secondary | ICD-10-CM

## 2023-08-03 DIAGNOSIS — R300 Dysuria: Secondary | ICD-10-CM

## 2023-08-03 LAB — POC URINALSYSI DIPSTICK (AUTOMATED)
Bilirubin, UA: NEGATIVE
Glucose, UA: NEGATIVE
Ketones, UA: NEGATIVE
Nitrite, UA: NEGATIVE
Protein, UA: POSITIVE — AB
Spec Grav, UA: 1.025 (ref 1.010–1.025)
Urobilinogen, UA: 0.2 U/dL
pH, UA: 6 (ref 5.0–8.0)

## 2023-08-03 MED ORDER — SULFAMETHOXAZOLE-TRIMETHOPRIM 800-160 MG PO TABS: 1.0000 | ORAL_TABLET | Freq: Two times a day (BID) | ORAL | 0 refills | Status: DC

## 2023-08-03 NOTE — Progress Notes (Signed)
Heidi Mitchell T. Heidi Bevill, MD, CAQ Sports Medicine [facility] [address] [place], [address]  Phone: [phone]  FAX: [phone]  Heidi Mitchell - 72 y.o. female  MRN [id number]  Date of Birth: Jan 29, 1951  Date: 08/03/2023  PCP: Karie Schwalbe, MD  Referral: Karie Schwalbe, MD  Chief Complaint  Patient presents with   Dysuria   Back Pain   Subjective:   Heidi Mitchell is a 72 y.o. very pleasant female patient with Body mass index is 22.53 kg/m. who presents with the following:  She is a very pleasant 72 year old lady who presents with ongoing dysuria and urgency.  She is currently being managed for breast cancer, and she has completed her chemotherapy.  She did recently have her surgery, and she is going to start radiation therapy on December 2.  She did have 1 UTI earlier in the year.  She had an E. coli UTI in August.  She does have a little bit of back pain, but she denies any flank pain.  She is not having any abdominal pain.  Review of Systems is noted in the HPI, as appropriate  Objective:   BP 120/70 (BP Location: Left Arm, Patient Position: Sitting, Cuff Size: Normal)   Pulse 70   Temp 100.1 F (37.8 C) (Temporal)   Ht 5' (1.524 m)   Wt 115 lb 6 oz (52.3 kg)   LMP 11/10/2012 (LMP Unknown)   SpO2 96%   BMI 22.53 kg/m    GEN: WDWN HEENT: Atraumatc, normocephalic. CV: RRR, No M/G/R PULM: CTA B, No wheezes, crackles, or rhonchi ABD: S, NT, ND, +BS, no rebound. No CVAT. No suprapubic tenderness.  She does have some mild pain roughly at L4-L5 bilaterally, erector spinae complex.  Laboratory and Imaging Data: Results for orders placed or performed in visit on 08/03/23  POCT Urinalysis Dipstick (Automated)  Result Value Ref Range   Color, UA Yellow    Clarity, UA Hazy    Glucose, UA Negative Negative   Bilirubin, UA Negative    Ketones, UA Negative    Spec Grav, UA 1.025 1.010 - 1.025   Blood, UA Trace     pH, UA 6.0 5.0 - 8.0   Protein, UA Positive (A) Negative   Urobilinogen, UA 0.2 0.2 or 1.0 E.U./dL   Nitrite, UA Negative    Leukocytes, UA Moderate (2+) (A) Negative     Assessment and Plan:     ICD-10-CM   1. Acute lower UTI  N39.0     2. Dysuria  R30.0 POCT Urinalysis Dipstick (Automated)    Urine Culture     Acute UTI.  Treat as such.  Urine cultures pending.  Medication Management during today's office visit: Meds ordered this encounter  Medications   sulfamethoxazole-trimethoprim (BACTRIM DS) 800-160 MG tablet    Sig: Take 1 tablet by mouth 2 (two) times daily.    Dispense:  14 tablet    Refill:  0   Medications Discontinued During This Encounter  Medication Reason   ondansetron (ZOFRAN) 8 MG tablet Completed Course   dexamethasone (DECADRON) 4 MG tablet Completed Course   prochlorperazine (COMPAZINE) 10 MG tablet Completed Course   traMADol (ULTRAM) 50 MG tablet Completed Course    Orders placed today for conditions managed today: Orders Placed This Encounter  Procedures   Urine Culture   POCT Urinalysis Dipstick (Automated)    Disposition: No follow-ups on file.  Dr[employer] One speech-to-text software  was used for transcription in this dictation.  Possible transcriptional errors can occur using Animal nutritionist.   Signed,  Elpidio Galea. Phil Michels, MD   Outpatient Encounter Medications as of 08/03/2023  Medication Sig   atorvastatin (LIPITOR) 10 MG tablet Take 1 tablet (10 mg total) by mouth daily.   estradiol (ESTRACE) 0.1 MG/GM vaginal cream Place 1 Applicatorful vaginally at bedtime.   omeprazole (PRILOSEC) 20 MG capsule Take 1 capsule (20 mg total) by mouth every other day. On an empty stomach   sodium fluoride (PREVIDENT 5000 PLUS) 1.1 % CREA dental cream Take by mouth.   sulfamethoxazole-trimethoprim (BACTRIM DS) 800-160 MG tablet Take 1 tablet by mouth 2 (two) times daily.   [DISCONTINUED] dexamethasone (DECADRON) 4 MG tablet Take 1 tablet day  before chemo and 1 tablet day after chemo with food (Patient not taking: Reported on 08/02/2023)   [DISCONTINUED] ondansetron (ZOFRAN) 8 MG tablet Take 1 tablet (8 mg total) by mouth every 8 (eight) hours as needed for nausea or vomiting. Start on the third day after chemotherapy.   [DISCONTINUED] prochlorperazine (COMPAZINE) 10 MG tablet Take 1 tablet (10 mg total) by mouth every 6 (six) hours as needed for nausea or vomiting. (Patient not taking: Reported on 08/02/2023)   [DISCONTINUED] traMADol (ULTRAM) 50 MG tablet Take 1 tablet (50 mg total) by mouth every 6 (six) hours as needed for moderate pain (pain score 4-6) or severe pain (pain score 7-10). (Patient not taking: Reported on 08/02/2023)   No facility-administered encounter medications on file as of 08/03/2023.

## 2023-08-05 LAB — URINE CULTURE
MICRO NUMBER:: 15757020
SPECIMEN QUALITY:: ADEQUATE

## 2023-08-07 DIAGNOSIS — Z170 Estrogen receptor positive status [ER+]: Secondary | ICD-10-CM | POA: Diagnosis not present

## 2023-08-07 DIAGNOSIS — Z510 Encounter for antineoplastic radiation therapy: Secondary | ICD-10-CM | POA: Diagnosis not present

## 2023-08-07 DIAGNOSIS — C50411 Malignant neoplasm of upper-outer quadrant of right female breast: Secondary | ICD-10-CM | POA: Diagnosis not present

## 2023-08-09 ENCOUNTER — Encounter: Payer: Self-pay | Admitting: *Deleted

## 2023-08-09 DIAGNOSIS — Z170 Estrogen receptor positive status [ER+]: Secondary | ICD-10-CM

## 2023-08-10 ENCOUNTER — Telehealth: Payer: Self-pay | Admitting: Hematology and Oncology

## 2023-08-10 NOTE — Telephone Encounter (Signed)
Patient is aware of scheduled appointment times/dates for follow up

## 2023-08-15 ENCOUNTER — Ambulatory Visit: Payer: Medicare PPO | Admitting: Radiation Oncology

## 2023-08-16 ENCOUNTER — Ambulatory Visit
Admission: RE | Admit: 2023-08-16 | Discharge: 2023-08-16 | Disposition: A | Discharge status: Home / Self Care | Payer: Medicare PPO | Source: Ambulatory Visit | Attending: Radiation Oncology | Admitting: Radiation Oncology

## 2023-08-16 ENCOUNTER — Ambulatory Visit: Payer: Medicare PPO

## 2023-08-16 ENCOUNTER — Other Ambulatory Visit: Payer: Self-pay

## 2023-08-16 ENCOUNTER — Encounter: Payer: Self-pay | Admitting: Hematology and Oncology

## 2023-08-16 DIAGNOSIS — Z510 Encounter for antineoplastic radiation therapy: Secondary | ICD-10-CM | POA: Diagnosis not present

## 2023-08-16 DIAGNOSIS — Z170 Estrogen receptor positive status [ER+]: Secondary | ICD-10-CM | POA: Diagnosis not present

## 2023-08-16 DIAGNOSIS — C50411 Malignant neoplasm of upper-outer quadrant of right female breast: Secondary | ICD-10-CM | POA: Diagnosis not present

## 2023-08-16 LAB — RAD ONC ARIA SESSION SUMMARY
Course Elapsed Days: 0
Plan Fractions Treated to Date: 1
Plan Fractions Treated to Date: 1
Plan Prescribed Dose Per Fraction: 2 Gy
Plan Prescribed Dose Per Fraction: 2 Gy
Plan Total Fractions Prescribed: 25
Plan Total Fractions Prescribed: 25
Plan Total Prescribed Dose: 50 Gy
Plan Total Prescribed Dose: 50 Gy
Reference Point Dosage Given to Date: 2 Gy
Reference Point Dosage Given to Date: 2 Gy
Reference Point Session Dosage Given: 2 Gy
Reference Point Session Dosage Given: 2 Gy
Session Number: 1

## 2023-08-17 ENCOUNTER — Ambulatory Visit: Payer: Medicare PPO

## 2023-08-17 ENCOUNTER — Other Ambulatory Visit: Payer: Self-pay

## 2023-08-17 ENCOUNTER — Ambulatory Visit
Admission: RE | Admit: 2023-08-17 | Discharge: 2023-08-17 | Disposition: A | Discharge status: Home / Self Care | Payer: Medicare PPO | Source: Ambulatory Visit | Attending: Radiation Oncology | Admitting: Radiation Oncology

## 2023-08-17 DIAGNOSIS — C50411 Malignant neoplasm of upper-outer quadrant of right female breast: Secondary | ICD-10-CM | POA: Diagnosis not present

## 2023-08-17 DIAGNOSIS — Z510 Encounter for antineoplastic radiation therapy: Secondary | ICD-10-CM | POA: Diagnosis not present

## 2023-08-17 DIAGNOSIS — Z170 Estrogen receptor positive status [ER+]: Secondary | ICD-10-CM | POA: Diagnosis not present

## 2023-08-17 LAB — RAD ONC ARIA SESSION SUMMARY
Course Elapsed Days: 1
Plan Fractions Treated to Date: 2
Plan Fractions Treated to Date: 2
Plan Prescribed Dose Per Fraction: 2 Gy
Plan Prescribed Dose Per Fraction: 2 Gy
Plan Total Fractions Prescribed: 25
Plan Total Fractions Prescribed: 25
Plan Total Prescribed Dose: 50 Gy
Plan Total Prescribed Dose: 50 Gy
Reference Point Dosage Given to Date: 4 Gy
Reference Point Dosage Given to Date: 4 Gy
Reference Point Session Dosage Given: 2 Gy
Reference Point Session Dosage Given: 2 Gy
Session Number: 2

## 2023-08-18 ENCOUNTER — Other Ambulatory Visit: Payer: Self-pay

## 2023-08-18 ENCOUNTER — Ambulatory Visit
Admission: RE | Admit: 2023-08-18 | Discharge: 2023-08-18 | Disposition: A | Discharge status: Home / Self Care | Payer: Medicare PPO | Source: Ambulatory Visit | Attending: Radiation Oncology | Admitting: Radiation Oncology

## 2023-08-18 DIAGNOSIS — Z510 Encounter for antineoplastic radiation therapy: Secondary | ICD-10-CM | POA: Diagnosis not present

## 2023-08-18 DIAGNOSIS — C50411 Malignant neoplasm of upper-outer quadrant of right female breast: Secondary | ICD-10-CM | POA: Diagnosis not present

## 2023-08-18 DIAGNOSIS — Z170 Estrogen receptor positive status [ER+]: Secondary | ICD-10-CM | POA: Diagnosis not present

## 2023-08-18 LAB — RAD ONC ARIA SESSION SUMMARY
Course Elapsed Days: 2
Plan Fractions Treated to Date: 3
Plan Fractions Treated to Date: 3
Plan Prescribed Dose Per Fraction: 2 Gy
Plan Prescribed Dose Per Fraction: 2 Gy
Plan Total Fractions Prescribed: 25
Plan Total Fractions Prescribed: 25
Plan Total Prescribed Dose: 50 Gy
Plan Total Prescribed Dose: 50 Gy
Reference Point Dosage Given to Date: 6 Gy
Reference Point Dosage Given to Date: 6 Gy
Reference Point Session Dosage Given: 2 Gy
Reference Point Session Dosage Given: 2 Gy
Session Number: 3

## 2023-08-19 ENCOUNTER — Ambulatory Visit
Admission: RE | Admit: 2023-08-19 | Discharge: 2023-08-19 | Disposition: A | Discharge status: Home / Self Care | Payer: Medicare PPO | Source: Ambulatory Visit | Attending: Radiation Oncology | Admitting: Radiation Oncology

## 2023-08-19 ENCOUNTER — Other Ambulatory Visit: Payer: Self-pay

## 2023-08-19 DIAGNOSIS — C50411 Malignant neoplasm of upper-outer quadrant of right female breast: Secondary | ICD-10-CM | POA: Diagnosis not present

## 2023-08-19 DIAGNOSIS — Z510 Encounter for antineoplastic radiation therapy: Secondary | ICD-10-CM | POA: Diagnosis not present

## 2023-08-19 DIAGNOSIS — Z170 Estrogen receptor positive status [ER+]: Secondary | ICD-10-CM | POA: Diagnosis not present

## 2023-08-19 LAB — RAD ONC ARIA SESSION SUMMARY
Course Elapsed Days: 3
Plan Fractions Treated to Date: 4
Plan Fractions Treated to Date: 4
Plan Prescribed Dose Per Fraction: 2 Gy
Plan Prescribed Dose Per Fraction: 2 Gy
Plan Total Fractions Prescribed: 25
Plan Total Fractions Prescribed: 25
Plan Total Prescribed Dose: 50 Gy
Plan Total Prescribed Dose: 50 Gy
Reference Point Dosage Given to Date: 8 Gy
Reference Point Dosage Given to Date: 8 Gy
Reference Point Session Dosage Given: 2 Gy
Reference Point Session Dosage Given: 2 Gy
Session Number: 4

## 2023-08-22 ENCOUNTER — Ambulatory Visit
Admission: RE | Admit: 2023-08-22 | Discharge: 2023-08-22 | Disposition: A | Discharge status: Home / Self Care | Payer: Medicare PPO | Source: Ambulatory Visit | Attending: Radiation Oncology | Admitting: Radiation Oncology

## 2023-08-22 ENCOUNTER — Other Ambulatory Visit: Payer: Self-pay

## 2023-08-22 ENCOUNTER — Ambulatory Visit: Payer: Medicare PPO

## 2023-08-22 DIAGNOSIS — Z170 Estrogen receptor positive status [ER+]: Secondary | ICD-10-CM | POA: Diagnosis not present

## 2023-08-22 DIAGNOSIS — Z510 Encounter for antineoplastic radiation therapy: Secondary | ICD-10-CM | POA: Diagnosis not present

## 2023-08-22 DIAGNOSIS — C50411 Malignant neoplasm of upper-outer quadrant of right female breast: Secondary | ICD-10-CM | POA: Diagnosis not present

## 2023-08-22 LAB — RAD ONC ARIA SESSION SUMMARY
Course Elapsed Days: 6
Plan Fractions Treated to Date: 5
Plan Fractions Treated to Date: 5
Plan Prescribed Dose Per Fraction: 2 Gy
Plan Prescribed Dose Per Fraction: 2 Gy
Plan Total Fractions Prescribed: 25
Plan Total Fractions Prescribed: 25
Plan Total Prescribed Dose: 50 Gy
Plan Total Prescribed Dose: 50 Gy
Reference Point Dosage Given to Date: 10 Gy
Reference Point Dosage Given to Date: 10 Gy
Reference Point Session Dosage Given: 2 Gy
Reference Point Session Dosage Given: 2 Gy
Session Number: 5

## 2023-08-22 MED ORDER — RADIAPLEXRX EX GEL: Freq: Once | CUTANEOUS | Status: AC

## 2023-08-22 MED ORDER — ALRA NON-METALLIC DEODORANT (RAD-ONC)
1.0000 | Freq: Once | TOPICAL | Status: AC
  Administered 2023-08-22: 1 via TOPICAL

## 2023-08-23 ENCOUNTER — Other Ambulatory Visit: Payer: Self-pay

## 2023-08-23 ENCOUNTER — Ambulatory Visit
Admission: RE | Admit: 2023-08-23 | Discharge: 2023-08-23 | Disposition: A | Discharge status: Home / Self Care | Payer: Medicare PPO | Source: Ambulatory Visit | Attending: Radiation Oncology | Admitting: Radiation Oncology

## 2023-08-23 DIAGNOSIS — C50411 Malignant neoplasm of upper-outer quadrant of right female breast: Secondary | ICD-10-CM | POA: Diagnosis not present

## 2023-08-23 DIAGNOSIS — Z170 Estrogen receptor positive status [ER+]: Secondary | ICD-10-CM | POA: Diagnosis not present

## 2023-08-23 DIAGNOSIS — Z510 Encounter for antineoplastic radiation therapy: Secondary | ICD-10-CM | POA: Diagnosis not present

## 2023-08-23 LAB — RAD ONC ARIA SESSION SUMMARY
Course Elapsed Days: 7
Plan Fractions Treated to Date: 6
Plan Fractions Treated to Date: 6
Plan Prescribed Dose Per Fraction: 2 Gy
Plan Prescribed Dose Per Fraction: 2 Gy
Plan Total Fractions Prescribed: 25
Plan Total Fractions Prescribed: 25
Plan Total Prescribed Dose: 50 Gy
Plan Total Prescribed Dose: 50 Gy
Reference Point Dosage Given to Date: 12 Gy
Reference Point Dosage Given to Date: 12 Gy
Reference Point Session Dosage Given: 2 Gy
Reference Point Session Dosage Given: 2 Gy
Session Number: 6

## 2023-08-24 ENCOUNTER — Other Ambulatory Visit: Payer: Self-pay

## 2023-08-24 ENCOUNTER — Ambulatory Visit
Admission: RE | Admit: 2023-08-24 | Discharge: 2023-08-24 | Disposition: A | Discharge status: Home / Self Care | Payer: Medicare PPO | Source: Ambulatory Visit | Attending: Radiation Oncology | Admitting: Radiation Oncology

## 2023-08-24 DIAGNOSIS — Z170 Estrogen receptor positive status [ER+]: Secondary | ICD-10-CM | POA: Diagnosis not present

## 2023-08-24 DIAGNOSIS — Z510 Encounter for antineoplastic radiation therapy: Secondary | ICD-10-CM | POA: Diagnosis not present

## 2023-08-24 DIAGNOSIS — C50411 Malignant neoplasm of upper-outer quadrant of right female breast: Secondary | ICD-10-CM | POA: Diagnosis not present

## 2023-08-24 LAB — RAD ONC ARIA SESSION SUMMARY
Course Elapsed Days: 8
Plan Fractions Treated to Date: 7
Plan Fractions Treated to Date: 7
Plan Prescribed Dose Per Fraction: 2 Gy
Plan Prescribed Dose Per Fraction: 2 Gy
Plan Total Fractions Prescribed: 25
Plan Total Fractions Prescribed: 25
Plan Total Prescribed Dose: 50 Gy
Plan Total Prescribed Dose: 50 Gy
Reference Point Dosage Given to Date: 14 Gy
Reference Point Dosage Given to Date: 14 Gy
Reference Point Session Dosage Given: 2 Gy
Reference Point Session Dosage Given: 2 Gy
Session Number: 7

## 2023-08-25 ENCOUNTER — Other Ambulatory Visit: Payer: Self-pay

## 2023-08-25 ENCOUNTER — Ambulatory Visit
Admission: RE | Admit: 2023-08-25 | Discharge: 2023-08-25 | Disposition: A | Discharge status: Home / Self Care | Payer: Medicare PPO | Source: Ambulatory Visit | Attending: Radiation Oncology | Admitting: Radiation Oncology

## 2023-08-25 DIAGNOSIS — C50411 Malignant neoplasm of upper-outer quadrant of right female breast: Secondary | ICD-10-CM | POA: Diagnosis not present

## 2023-08-25 DIAGNOSIS — Z510 Encounter for antineoplastic radiation therapy: Secondary | ICD-10-CM | POA: Diagnosis not present

## 2023-08-25 DIAGNOSIS — Z170 Estrogen receptor positive status [ER+]: Secondary | ICD-10-CM | POA: Diagnosis not present

## 2023-08-25 LAB — RAD ONC ARIA SESSION SUMMARY
Course Elapsed Days: 9
Plan Fractions Treated to Date: 8
Plan Fractions Treated to Date: 8
Plan Prescribed Dose Per Fraction: 2 Gy
Plan Prescribed Dose Per Fraction: 2 Gy
Plan Total Fractions Prescribed: 25
Plan Total Fractions Prescribed: 25
Plan Total Prescribed Dose: 50 Gy
Plan Total Prescribed Dose: 50 Gy
Reference Point Dosage Given to Date: 16 Gy
Reference Point Dosage Given to Date: 16 Gy
Reference Point Session Dosage Given: 2 Gy
Reference Point Session Dosage Given: 2 Gy
Session Number: 8

## 2023-08-26 ENCOUNTER — Other Ambulatory Visit: Payer: Self-pay

## 2023-08-26 ENCOUNTER — Ambulatory Visit
Admission: RE | Admit: 2023-08-26 | Discharge: 2023-08-26 | Disposition: A | Discharge status: Home / Self Care | Payer: Medicare PPO | Source: Ambulatory Visit | Attending: Radiation Oncology | Admitting: Radiation Oncology

## 2023-08-26 DIAGNOSIS — Z170 Estrogen receptor positive status [ER+]: Secondary | ICD-10-CM | POA: Diagnosis not present

## 2023-08-26 DIAGNOSIS — C50411 Malignant neoplasm of upper-outer quadrant of right female breast: Secondary | ICD-10-CM | POA: Diagnosis not present

## 2023-08-26 DIAGNOSIS — Z510 Encounter for antineoplastic radiation therapy: Secondary | ICD-10-CM | POA: Diagnosis not present

## 2023-08-26 LAB — RAD ONC ARIA SESSION SUMMARY
Course Elapsed Days: 10
Plan Fractions Treated to Date: 9
Plan Fractions Treated to Date: 9
Plan Prescribed Dose Per Fraction: 2 Gy
Plan Prescribed Dose Per Fraction: 2 Gy
Plan Total Fractions Prescribed: 25
Plan Total Fractions Prescribed: 25
Plan Total Prescribed Dose: 50 Gy
Plan Total Prescribed Dose: 50 Gy
Reference Point Dosage Given to Date: 18 Gy
Reference Point Dosage Given to Date: 18 Gy
Reference Point Session Dosage Given: 2 Gy
Reference Point Session Dosage Given: 2 Gy
Session Number: 9

## 2023-08-28 ENCOUNTER — Encounter: Payer: Self-pay | Admitting: Hematology and Oncology

## 2023-08-29 ENCOUNTER — Ambulatory Visit: Payer: Medicare PPO

## 2023-08-29 ENCOUNTER — Other Ambulatory Visit: Payer: Self-pay

## 2023-08-29 ENCOUNTER — Ambulatory Visit
Admission: RE | Admit: 2023-08-29 | Discharge: 2023-08-29 | Disposition: A | Discharge status: Home / Self Care | Payer: Medicare PPO | Source: Ambulatory Visit | Attending: Radiation Oncology | Admitting: Radiation Oncology

## 2023-08-29 DIAGNOSIS — C50411 Malignant neoplasm of upper-outer quadrant of right female breast: Secondary | ICD-10-CM | POA: Diagnosis not present

## 2023-08-29 DIAGNOSIS — Z510 Encounter for antineoplastic radiation therapy: Secondary | ICD-10-CM | POA: Diagnosis not present

## 2023-08-29 DIAGNOSIS — Z170 Estrogen receptor positive status [ER+]: Secondary | ICD-10-CM | POA: Diagnosis not present

## 2023-08-29 LAB — RAD ONC ARIA SESSION SUMMARY
Course Elapsed Days: 13
Plan Fractions Treated to Date: 10
Plan Fractions Treated to Date: 10
Plan Prescribed Dose Per Fraction: 2 Gy
Plan Prescribed Dose Per Fraction: 2 Gy
Plan Total Fractions Prescribed: 25
Plan Total Fractions Prescribed: 25
Plan Total Prescribed Dose: 50 Gy
Plan Total Prescribed Dose: 50 Gy
Reference Point Dosage Given to Date: 20 Gy
Reference Point Dosage Given to Date: 20 Gy
Reference Point Session Dosage Given: 2 Gy
Reference Point Session Dosage Given: 2 Gy
Session Number: 10

## 2023-08-30 ENCOUNTER — Other Ambulatory Visit: Payer: Self-pay

## 2023-08-30 ENCOUNTER — Ambulatory Visit
Admission: RE | Admit: 2023-08-30 | Discharge: 2023-08-30 | Disposition: A | Discharge status: Home / Self Care | Payer: Medicare PPO | Source: Ambulatory Visit | Attending: Radiation Oncology | Admitting: Radiation Oncology

## 2023-08-30 DIAGNOSIS — C50411 Malignant neoplasm of upper-outer quadrant of right female breast: Secondary | ICD-10-CM | POA: Diagnosis not present

## 2023-08-30 DIAGNOSIS — Z510 Encounter for antineoplastic radiation therapy: Secondary | ICD-10-CM | POA: Diagnosis not present

## 2023-08-30 DIAGNOSIS — Z170 Estrogen receptor positive status [ER+]: Secondary | ICD-10-CM | POA: Diagnosis not present

## 2023-08-30 LAB — RAD ONC ARIA SESSION SUMMARY
Course Elapsed Days: 14
Plan Fractions Treated to Date: 11
Plan Fractions Treated to Date: 11
Plan Prescribed Dose Per Fraction: 2 Gy
Plan Prescribed Dose Per Fraction: 2 Gy
Plan Total Fractions Prescribed: 25
Plan Total Fractions Prescribed: 25
Plan Total Prescribed Dose: 50 Gy
Plan Total Prescribed Dose: 50 Gy
Reference Point Dosage Given to Date: 22 Gy
Reference Point Dosage Given to Date: 22 Gy
Reference Point Session Dosage Given: 2 Gy
Reference Point Session Dosage Given: 2 Gy
Session Number: 11

## 2023-08-31 ENCOUNTER — Ambulatory Visit: Payer: Medicare PPO

## 2023-09-01 ENCOUNTER — Ambulatory Visit
Admission: RE | Admit: 2023-09-01 | Discharge: 2023-09-01 | Disposition: A | Discharge status: Home / Self Care | Payer: Medicare PPO | Source: Ambulatory Visit | Attending: Radiation Oncology | Admitting: Radiation Oncology

## 2023-09-01 ENCOUNTER — Other Ambulatory Visit: Payer: Self-pay

## 2023-09-01 DIAGNOSIS — Z510 Encounter for antineoplastic radiation therapy: Secondary | ICD-10-CM | POA: Diagnosis not present

## 2023-09-01 DIAGNOSIS — C50411 Malignant neoplasm of upper-outer quadrant of right female breast: Secondary | ICD-10-CM | POA: Diagnosis not present

## 2023-09-01 DIAGNOSIS — Z170 Estrogen receptor positive status [ER+]: Secondary | ICD-10-CM | POA: Diagnosis not present

## 2023-09-01 LAB — RAD ONC ARIA SESSION SUMMARY
Course Elapsed Days: 16
Plan Fractions Treated to Date: 12
Plan Fractions Treated to Date: 12
Plan Prescribed Dose Per Fraction: 2 Gy
Plan Prescribed Dose Per Fraction: 2 Gy
Plan Total Fractions Prescribed: 25
Plan Total Fractions Prescribed: 25
Plan Total Prescribed Dose: 50 Gy
Plan Total Prescribed Dose: 50 Gy
Reference Point Dosage Given to Date: 24 Gy
Reference Point Dosage Given to Date: 24 Gy
Reference Point Session Dosage Given: 2 Gy
Reference Point Session Dosage Given: 2 Gy
Session Number: 12

## 2023-09-02 ENCOUNTER — Other Ambulatory Visit: Payer: Self-pay

## 2023-09-02 ENCOUNTER — Ambulatory Visit
Admission: RE | Admit: 2023-09-02 | Discharge: 2023-09-02 | Disposition: A | Discharge status: Home / Self Care | Payer: Medicare PPO | Source: Ambulatory Visit | Attending: Radiation Oncology | Admitting: Radiation Oncology

## 2023-09-02 DIAGNOSIS — Z170 Estrogen receptor positive status [ER+]: Secondary | ICD-10-CM | POA: Diagnosis not present

## 2023-09-02 DIAGNOSIS — Z510 Encounter for antineoplastic radiation therapy: Secondary | ICD-10-CM | POA: Diagnosis not present

## 2023-09-02 DIAGNOSIS — C50411 Malignant neoplasm of upper-outer quadrant of right female breast: Secondary | ICD-10-CM | POA: Diagnosis not present

## 2023-09-02 LAB — RAD ONC ARIA SESSION SUMMARY
Course Elapsed Days: 17
Plan Fractions Treated to Date: 13
Plan Fractions Treated to Date: 13
Plan Prescribed Dose Per Fraction: 2 Gy
Plan Prescribed Dose Per Fraction: 2 Gy
Plan Total Fractions Prescribed: 25
Plan Total Fractions Prescribed: 25
Plan Total Prescribed Dose: 50 Gy
Plan Total Prescribed Dose: 50 Gy
Reference Point Dosage Given to Date: 26 Gy
Reference Point Dosage Given to Date: 26 Gy
Reference Point Session Dosage Given: 2 Gy
Reference Point Session Dosage Given: 2 Gy
Session Number: 13

## 2023-09-05 ENCOUNTER — Other Ambulatory Visit: Payer: Self-pay

## 2023-09-05 ENCOUNTER — Ambulatory Visit
Admission: RE | Admit: 2023-09-05 | Discharge: 2023-09-05 | Disposition: A | Discharge status: Home / Self Care | Payer: Medicare PPO | Source: Ambulatory Visit | Attending: Radiation Oncology | Admitting: Radiation Oncology

## 2023-09-05 ENCOUNTER — Ambulatory Visit: Payer: Medicare PPO

## 2023-09-05 DIAGNOSIS — C50411 Malignant neoplasm of upper-outer quadrant of right female breast: Secondary | ICD-10-CM | POA: Diagnosis not present

## 2023-09-05 DIAGNOSIS — Z170 Estrogen receptor positive status [ER+]: Secondary | ICD-10-CM | POA: Diagnosis not present

## 2023-09-05 DIAGNOSIS — Z510 Encounter for antineoplastic radiation therapy: Secondary | ICD-10-CM | POA: Diagnosis not present

## 2023-09-05 LAB — RAD ONC ARIA SESSION SUMMARY
Course Elapsed Days: 20
Plan Fractions Treated to Date: 14
Plan Fractions Treated to Date: 14
Plan Prescribed Dose Per Fraction: 2 Gy
Plan Prescribed Dose Per Fraction: 2 Gy
Plan Total Fractions Prescribed: 25
Plan Total Fractions Prescribed: 25
Plan Total Prescribed Dose: 50 Gy
Plan Total Prescribed Dose: 50 Gy
Reference Point Dosage Given to Date: 28 Gy
Reference Point Dosage Given to Date: 28 Gy
Reference Point Session Dosage Given: 2 Gy
Reference Point Session Dosage Given: 2 Gy
Session Number: 14

## 2023-09-06 ENCOUNTER — Ambulatory Visit
Admission: RE | Admit: 2023-09-06 | Discharge: 2023-09-06 | Disposition: A | Discharge status: Home / Self Care | Payer: Medicare PPO | Source: Ambulatory Visit | Attending: Radiation Oncology | Admitting: Radiation Oncology

## 2023-09-06 ENCOUNTER — Other Ambulatory Visit: Payer: Self-pay

## 2023-09-06 DIAGNOSIS — C50411 Malignant neoplasm of upper-outer quadrant of right female breast: Secondary | ICD-10-CM | POA: Diagnosis not present

## 2023-09-06 DIAGNOSIS — Z170 Estrogen receptor positive status [ER+]: Secondary | ICD-10-CM | POA: Diagnosis not present

## 2023-09-06 DIAGNOSIS — Z510 Encounter for antineoplastic radiation therapy: Secondary | ICD-10-CM | POA: Diagnosis not present

## 2023-09-06 LAB — RAD ONC ARIA SESSION SUMMARY
Course Elapsed Days: 21
Plan Fractions Treated to Date: 15
Plan Fractions Treated to Date: 15
Plan Prescribed Dose Per Fraction: 2 Gy
Plan Prescribed Dose Per Fraction: 2 Gy
Plan Total Fractions Prescribed: 25
Plan Total Fractions Prescribed: 25
Plan Total Prescribed Dose: 50 Gy
Plan Total Prescribed Dose: 50 Gy
Reference Point Dosage Given to Date: 30 Gy
Reference Point Dosage Given to Date: 30 Gy
Reference Point Session Dosage Given: 2 Gy
Reference Point Session Dosage Given: 2 Gy
Session Number: 15

## 2023-09-08 ENCOUNTER — Ambulatory Visit
Admission: RE | Admit: 2023-09-08 | Discharge: 2023-09-08 | Disposition: A | Discharge status: Home / Self Care | Payer: Medicare HMO | Source: Ambulatory Visit | Attending: Radiation Oncology | Admitting: Radiation Oncology

## 2023-09-08 ENCOUNTER — Other Ambulatory Visit: Payer: Self-pay

## 2023-09-08 DIAGNOSIS — C50411 Malignant neoplasm of upper-outer quadrant of right female breast: Secondary | ICD-10-CM | POA: Diagnosis not present

## 2023-09-08 DIAGNOSIS — Z170 Estrogen receptor positive status [ER+]: Secondary | ICD-10-CM | POA: Diagnosis not present

## 2023-09-08 DIAGNOSIS — Z510 Encounter for antineoplastic radiation therapy: Secondary | ICD-10-CM | POA: Diagnosis not present

## 2023-09-08 LAB — RAD ONC ARIA SESSION SUMMARY
Course Elapsed Days: 23
Plan Fractions Treated to Date: 16
Plan Fractions Treated to Date: 16
Plan Prescribed Dose Per Fraction: 2 Gy
Plan Prescribed Dose Per Fraction: 2 Gy
Plan Total Fractions Prescribed: 25
Plan Total Fractions Prescribed: 25
Plan Total Prescribed Dose: 50 Gy
Plan Total Prescribed Dose: 50 Gy
Reference Point Dosage Given to Date: 32 Gy
Reference Point Dosage Given to Date: 32 Gy
Reference Point Session Dosage Given: 2 Gy
Reference Point Session Dosage Given: 2 Gy
Session Number: 16

## 2023-09-09 ENCOUNTER — Other Ambulatory Visit: Payer: Self-pay

## 2023-09-09 ENCOUNTER — Ambulatory Visit
Admission: RE | Admit: 2023-09-09 | Discharge: 2023-09-09 | Disposition: A | Discharge status: Home / Self Care | Payer: Medicare HMO | Source: Ambulatory Visit | Attending: Radiation Oncology | Admitting: Radiation Oncology

## 2023-09-09 ENCOUNTER — Ambulatory Visit
Admission: RE | Admit: 2023-09-09 | Discharge: 2023-09-09 | Disposition: A | Discharge status: Home / Self Care | Payer: Medicare HMO | Source: Ambulatory Visit | Attending: Radiation Oncology

## 2023-09-09 DIAGNOSIS — C50411 Malignant neoplasm of upper-outer quadrant of right female breast: Secondary | ICD-10-CM | POA: Diagnosis not present

## 2023-09-09 DIAGNOSIS — Z510 Encounter for antineoplastic radiation therapy: Secondary | ICD-10-CM | POA: Diagnosis not present

## 2023-09-09 DIAGNOSIS — Z170 Estrogen receptor positive status [ER+]: Secondary | ICD-10-CM | POA: Diagnosis not present

## 2023-09-09 LAB — RAD ONC ARIA SESSION SUMMARY
Course Elapsed Days: 24
Plan Fractions Treated to Date: 17
Plan Fractions Treated to Date: 17
Plan Prescribed Dose Per Fraction: 2 Gy
Plan Prescribed Dose Per Fraction: 2 Gy
Plan Total Fractions Prescribed: 25
Plan Total Fractions Prescribed: 25
Plan Total Prescribed Dose: 50 Gy
Plan Total Prescribed Dose: 50 Gy
Reference Point Dosage Given to Date: 34 Gy
Reference Point Dosage Given to Date: 34 Gy
Reference Point Session Dosage Given: 2 Gy
Reference Point Session Dosage Given: 2 Gy
Session Number: 17

## 2023-09-12 ENCOUNTER — Other Ambulatory Visit: Payer: Self-pay

## 2023-09-12 ENCOUNTER — Ambulatory Visit: Payer: Medicare PPO | Admitting: Radiation Oncology

## 2023-09-12 ENCOUNTER — Ambulatory Visit
Admission: RE | Admit: 2023-09-12 | Discharge: 2023-09-12 | Disposition: A | Discharge status: Home / Self Care | Payer: Medicare HMO | Source: Ambulatory Visit | Attending: Radiation Oncology | Admitting: Radiation Oncology

## 2023-09-12 DIAGNOSIS — C50411 Malignant neoplasm of upper-outer quadrant of right female breast: Secondary | ICD-10-CM | POA: Diagnosis not present

## 2023-09-12 DIAGNOSIS — Z510 Encounter for antineoplastic radiation therapy: Secondary | ICD-10-CM | POA: Diagnosis not present

## 2023-09-12 DIAGNOSIS — Z170 Estrogen receptor positive status [ER+]: Secondary | ICD-10-CM | POA: Diagnosis not present

## 2023-09-12 LAB — RAD ONC ARIA SESSION SUMMARY
Course Elapsed Days: 27
Plan Fractions Treated to Date: 18
Plan Fractions Treated to Date: 18
Plan Prescribed Dose Per Fraction: 2 Gy
Plan Prescribed Dose Per Fraction: 2 Gy
Plan Total Fractions Prescribed: 25
Plan Total Fractions Prescribed: 25
Plan Total Prescribed Dose: 50 Gy
Plan Total Prescribed Dose: 50 Gy
Reference Point Dosage Given to Date: 36 Gy
Reference Point Dosage Given to Date: 36 Gy
Reference Point Session Dosage Given: 2 Gy
Reference Point Session Dosage Given: 2 Gy
Session Number: 18

## 2023-09-13 ENCOUNTER — Ambulatory Visit
Admission: RE | Admit: 2023-09-13 | Discharge: 2023-09-13 | Disposition: A | Discharge status: Home / Self Care | Payer: Medicare HMO | Source: Ambulatory Visit | Attending: Radiation Oncology | Admitting: Radiation Oncology

## 2023-09-13 ENCOUNTER — Other Ambulatory Visit: Payer: Self-pay

## 2023-09-13 ENCOUNTER — Encounter: Payer: Self-pay | Admitting: Hematology and Oncology

## 2023-09-13 DIAGNOSIS — C50411 Malignant neoplasm of upper-outer quadrant of right female breast: Secondary | ICD-10-CM | POA: Diagnosis not present

## 2023-09-13 DIAGNOSIS — Z510 Encounter for antineoplastic radiation therapy: Secondary | ICD-10-CM | POA: Diagnosis not present

## 2023-09-13 DIAGNOSIS — Z170 Estrogen receptor positive status [ER+]: Secondary | ICD-10-CM | POA: Diagnosis not present

## 2023-09-13 LAB — RAD ONC ARIA SESSION SUMMARY
Course Elapsed Days: 28
Plan Fractions Treated to Date: 19
Plan Fractions Treated to Date: 19
Plan Prescribed Dose Per Fraction: 2 Gy
Plan Prescribed Dose Per Fraction: 2 Gy
Plan Total Fractions Prescribed: 25
Plan Total Fractions Prescribed: 25
Plan Total Prescribed Dose: 50 Gy
Plan Total Prescribed Dose: 50 Gy
Reference Point Dosage Given to Date: 38 Gy
Reference Point Dosage Given to Date: 38 Gy
Reference Point Session Dosage Given: 2 Gy
Reference Point Session Dosage Given: 2 Gy
Session Number: 19

## 2023-09-13 NOTE — Progress Notes (Signed)
 Pt called inquiring about financial assistance for her medical bills.  I informed her that the financial assistance the hospital offers are for uninsured pts but if her balance reaches to $5k she can apply for the hardship settlement program until then she can be set up on a payment plan.  I also informed her of the [employer], she would like to apply so she will provide her proof of income and SNAP benefits.  She has my contact info to reach out with any questions or concerns.

## 2023-09-15 ENCOUNTER — Ambulatory Visit
Admission: RE | Admit: 2023-09-15 | Discharge: 2023-09-15 | Disposition: A | Discharge status: Home / Self Care | Payer: Medicare PPO | Source: Ambulatory Visit | Attending: Radiation Oncology | Admitting: Radiation Oncology

## 2023-09-15 ENCOUNTER — Other Ambulatory Visit: Payer: Self-pay

## 2023-09-15 DIAGNOSIS — Z79899 Other long term (current) drug therapy: Secondary | ICD-10-CM | POA: Insufficient documentation

## 2023-09-15 DIAGNOSIS — Z170 Estrogen receptor positive status [ER+]: Secondary | ICD-10-CM | POA: Insufficient documentation

## 2023-09-15 DIAGNOSIS — Z510 Encounter for antineoplastic radiation therapy: Secondary | ICD-10-CM | POA: Diagnosis not present

## 2023-09-15 DIAGNOSIS — C50411 Malignant neoplasm of upper-outer quadrant of right female breast: Secondary | ICD-10-CM | POA: Insufficient documentation

## 2023-09-15 LAB — RAD ONC ARIA SESSION SUMMARY
Course Elapsed Days: 30
Plan Fractions Treated to Date: 20
Plan Fractions Treated to Date: 20
Plan Prescribed Dose Per Fraction: 2 Gy
Plan Prescribed Dose Per Fraction: 2 Gy
Plan Total Fractions Prescribed: 25
Plan Total Fractions Prescribed: 25
Plan Total Prescribed Dose: 50 Gy
Plan Total Prescribed Dose: 50 Gy
Reference Point Dosage Given to Date: 40 Gy
Reference Point Dosage Given to Date: 40 Gy
Reference Point Session Dosage Given: 2 Gy
Reference Point Session Dosage Given: 2 Gy
Session Number: 20

## 2023-09-16 ENCOUNTER — Ambulatory Visit
Admission: RE | Admit: 2023-09-16 | Discharge: 2023-09-16 | Disposition: A | Discharge status: Home / Self Care | Payer: Medicare PPO | Source: Ambulatory Visit | Attending: Radiation Oncology | Admitting: Radiation Oncology

## 2023-09-16 ENCOUNTER — Other Ambulatory Visit: Payer: Self-pay

## 2023-09-16 DIAGNOSIS — Z170 Estrogen receptor positive status [ER+]: Secondary | ICD-10-CM | POA: Diagnosis not present

## 2023-09-16 DIAGNOSIS — Z79899 Other long term (current) drug therapy: Secondary | ICD-10-CM | POA: Diagnosis not present

## 2023-09-16 DIAGNOSIS — Z510 Encounter for antineoplastic radiation therapy: Secondary | ICD-10-CM | POA: Diagnosis not present

## 2023-09-16 DIAGNOSIS — C50411 Malignant neoplasm of upper-outer quadrant of right female breast: Secondary | ICD-10-CM | POA: Diagnosis not present

## 2023-09-16 LAB — RAD ONC ARIA SESSION SUMMARY
Course Elapsed Days: 31
Plan Fractions Treated to Date: 21
Plan Fractions Treated to Date: 21
Plan Prescribed Dose Per Fraction: 2 Gy
Plan Prescribed Dose Per Fraction: 2 Gy
Plan Total Fractions Prescribed: 25
Plan Total Fractions Prescribed: 25
Plan Total Prescribed Dose: 50 Gy
Plan Total Prescribed Dose: 50 Gy
Reference Point Dosage Given to Date: 42 Gy
Reference Point Dosage Given to Date: 42 Gy
Reference Point Session Dosage Given: 2 Gy
Reference Point Session Dosage Given: 2 Gy
Session Number: 21

## 2023-09-19 ENCOUNTER — Ambulatory Visit
Admission: RE | Admit: 2023-09-19 | Discharge: 2023-09-19 | Disposition: A | Discharge status: Home / Self Care | Payer: Medicare PPO | Source: Ambulatory Visit | Attending: Radiation Oncology | Admitting: Radiation Oncology

## 2023-09-19 ENCOUNTER — Other Ambulatory Visit: Payer: Self-pay

## 2023-09-19 ENCOUNTER — Ambulatory Visit: Payer: Medicare PPO | Admitting: Radiation Oncology

## 2023-09-19 DIAGNOSIS — Z510 Encounter for antineoplastic radiation therapy: Secondary | ICD-10-CM | POA: Diagnosis not present

## 2023-09-19 DIAGNOSIS — Z79899 Other long term (current) drug therapy: Secondary | ICD-10-CM | POA: Diagnosis not present

## 2023-09-19 DIAGNOSIS — C50411 Malignant neoplasm of upper-outer quadrant of right female breast: Secondary | ICD-10-CM | POA: Diagnosis not present

## 2023-09-19 DIAGNOSIS — Z170 Estrogen receptor positive status [ER+]: Secondary | ICD-10-CM | POA: Diagnosis not present

## 2023-09-19 LAB — RAD ONC ARIA SESSION SUMMARY
Course Elapsed Days: 34
Plan Fractions Treated to Date: 22
Plan Fractions Treated to Date: 22
Plan Prescribed Dose Per Fraction: 2 Gy
Plan Prescribed Dose Per Fraction: 2 Gy
Plan Total Fractions Prescribed: 25
Plan Total Fractions Prescribed: 25
Plan Total Prescribed Dose: 50 Gy
Plan Total Prescribed Dose: 50 Gy
Reference Point Dosage Given to Date: 44 Gy
Reference Point Dosage Given to Date: 44 Gy
Reference Point Session Dosage Given: 2 Gy
Reference Point Session Dosage Given: 2 Gy
Session Number: 22

## 2023-09-20 ENCOUNTER — Ambulatory Visit
Admission: RE | Admit: 2023-09-20 | Discharge: 2023-09-20 | Disposition: A | Discharge status: Home / Self Care | Payer: Medicare PPO | Source: Ambulatory Visit | Attending: Radiation Oncology

## 2023-09-20 ENCOUNTER — Other Ambulatory Visit: Payer: Self-pay

## 2023-09-20 DIAGNOSIS — Z510 Encounter for antineoplastic radiation therapy: Secondary | ICD-10-CM | POA: Diagnosis not present

## 2023-09-20 DIAGNOSIS — C50411 Malignant neoplasm of upper-outer quadrant of right female breast: Secondary | ICD-10-CM | POA: Diagnosis not present

## 2023-09-20 DIAGNOSIS — Z79899 Other long term (current) drug therapy: Secondary | ICD-10-CM | POA: Diagnosis not present

## 2023-09-20 DIAGNOSIS — Z170 Estrogen receptor positive status [ER+]: Secondary | ICD-10-CM | POA: Diagnosis not present

## 2023-09-20 LAB — RAD ONC ARIA SESSION SUMMARY
Course Elapsed Days: 35
Plan Fractions Treated to Date: 23
Plan Fractions Treated to Date: 23
Plan Prescribed Dose Per Fraction: 2 Gy
Plan Prescribed Dose Per Fraction: 2 Gy
Plan Total Fractions Prescribed: 25
Plan Total Fractions Prescribed: 25
Plan Total Prescribed Dose: 50 Gy
Plan Total Prescribed Dose: 50 Gy
Reference Point Dosage Given to Date: 46 Gy
Reference Point Dosage Given to Date: 46 Gy
Reference Point Session Dosage Given: 2 Gy
Reference Point Session Dosage Given: 2 Gy
Session Number: 23

## 2023-09-21 ENCOUNTER — Other Ambulatory Visit: Payer: Self-pay

## 2023-09-21 ENCOUNTER — Ambulatory Visit: Payer: Medicare PPO

## 2023-09-21 ENCOUNTER — Ambulatory Visit
Admission: RE | Admit: 2023-09-21 | Discharge: 2023-09-21 | Disposition: A | Discharge status: Home / Self Care | Payer: Medicare PPO | Source: Ambulatory Visit | Attending: Radiation Oncology | Admitting: Radiation Oncology

## 2023-09-21 DIAGNOSIS — Z170 Estrogen receptor positive status [ER+]: Secondary | ICD-10-CM | POA: Diagnosis not present

## 2023-09-21 DIAGNOSIS — Z510 Encounter for antineoplastic radiation therapy: Secondary | ICD-10-CM | POA: Diagnosis not present

## 2023-09-21 DIAGNOSIS — Z79899 Other long term (current) drug therapy: Secondary | ICD-10-CM | POA: Diagnosis not present

## 2023-09-21 DIAGNOSIS — C50411 Malignant neoplasm of upper-outer quadrant of right female breast: Secondary | ICD-10-CM | POA: Diagnosis not present

## 2023-09-21 LAB — RAD ONC ARIA SESSION SUMMARY
Course Elapsed Days: 36
Plan Fractions Treated to Date: 24
Plan Fractions Treated to Date: 24
Plan Prescribed Dose Per Fraction: 2 Gy
Plan Prescribed Dose Per Fraction: 2 Gy
Plan Total Fractions Prescribed: 25
Plan Total Fractions Prescribed: 25
Plan Total Prescribed Dose: 50 Gy
Plan Total Prescribed Dose: 50 Gy
Reference Point Dosage Given to Date: 48 Gy
Reference Point Dosage Given to Date: 48 Gy
Reference Point Session Dosage Given: 2 Gy
Reference Point Session Dosage Given: 2 Gy
Session Number: 24

## 2023-09-22 ENCOUNTER — Ambulatory Visit: Payer: Medicare PPO

## 2023-09-22 ENCOUNTER — Ambulatory Visit
Admission: RE | Admit: 2023-09-22 | Discharge: 2023-09-22 | Disposition: A | Discharge status: Home / Self Care | Payer: Medicare PPO | Source: Ambulatory Visit | Attending: Radiation Oncology | Admitting: Radiation Oncology

## 2023-09-22 ENCOUNTER — Other Ambulatory Visit (HOSPITAL_COMMUNITY): Payer: Self-pay

## 2023-09-22 ENCOUNTER — Encounter: Payer: Self-pay | Admitting: Hematology and Oncology

## 2023-09-22 ENCOUNTER — Telehealth: Payer: Self-pay | Admitting: Pharmacy Technician

## 2023-09-22 ENCOUNTER — Inpatient Hospital Stay (HOSPITAL_BASED_OUTPATIENT_CLINIC_OR_DEPARTMENT_OTHER): Payer: Medicare PPO | Admitting: Hematology and Oncology

## 2023-09-22 ENCOUNTER — Other Ambulatory Visit: Payer: Self-pay

## 2023-09-22 VITALS — BP 130/62 | HR 66 | Temp 97.6°F | Resp 17 | Ht 60.0 in | Wt 117.4 lb

## 2023-09-22 DIAGNOSIS — Z170 Estrogen receptor positive status [ER+]: Secondary | ICD-10-CM | POA: Insufficient documentation

## 2023-09-22 DIAGNOSIS — Z79899 Other long term (current) drug therapy: Secondary | ICD-10-CM | POA: Insufficient documentation

## 2023-09-22 DIAGNOSIS — C50411 Malignant neoplasm of upper-outer quadrant of right female breast: Secondary | ICD-10-CM | POA: Insufficient documentation

## 2023-09-22 DIAGNOSIS — Z510 Encounter for antineoplastic radiation therapy: Secondary | ICD-10-CM | POA: Diagnosis not present

## 2023-09-22 LAB — RAD ONC ARIA SESSION SUMMARY
Course Elapsed Days: 37
Plan Fractions Treated to Date: 25
Plan Fractions Treated to Date: 25
Plan Prescribed Dose Per Fraction: 2 Gy
Plan Prescribed Dose Per Fraction: 2 Gy
Plan Total Fractions Prescribed: 25
Plan Total Fractions Prescribed: 25
Plan Total Prescribed Dose: 50 Gy
Plan Total Prescribed Dose: 50 Gy
Reference Point Dosage Given to Date: 50 Gy
Reference Point Dosage Given to Date: 50 Gy
Reference Point Session Dosage Given: 2 Gy
Reference Point Session Dosage Given: 2 Gy
Session Number: 25

## 2023-09-22 MED ORDER — CAPECITABINE 500 MG PO TABS
625.0000 mg/m2 | ORAL_TABLET | Freq: Two times a day (BID) | ORAL | 7 refills | Status: DC
  Filled 2023-09-26: qty 56, 21d supply, fill #0
  Filled 2023-10-12: qty 56, 21d supply, fill #1

## 2023-09-22 NOTE — Assessment & Plan Note (Signed)
 03/04/2023:Palpable right breast lump UOQ 12 o'clock position: 1.7 cm by ultrasound, 3 abnormal lymph nodes: 1 biopsy positive with extranodal extension.  Biopsy of the lump: Grade 2 IDC with medullary features ER 60% weak, PR 0%, Ki-67 80%, HER2 negative  03/24/2023: Breast MRI: Biopsy-proven malignancy 1.8 cm, non-mass enhancement measuring 3 cm, biopsy-proven axillary lymph node.  MRI guided biopsy is being planned for 04/01/2023   Treatment plan: Neoadjuvant chemotherapy with Taxotere  and Cytoxan  every 3 weeks x 4 completed 05/26/2023 Breast conserving surgery with targeted node dissection: 07/05/2023: Grade 2 IDC 1.5 cm, ER 60%, PR 0%, HER2 0, Ki67 80%, 1/9 lymph nodes positive (repeat prognostic panel: ER 0%, PR 0%, Ki67 30%, HER2 1+) Adjuvant radiation 08/17/2023-09/29/2023 No role of adjuvant antiestrogen therapy since she is ER negative --------------------------------------------------------------------------------------------- Discussed with the patient about adjuvant Xeloda  for 6 months. Breast cancer surveillance: We will also add guardant reveal for MRD testing in addition to annual mammograms.

## 2023-09-22 NOTE — Progress Notes (Signed)
 Patient Care Team: Jimmy Charlie FERNS, MD as PCP - General (Internal Medicine) Jerel Reena Curio as Consulting Physician Connell Davies, MD as Referring Physician (Obstetrics and Gynecology) Odean Potts, MD as Consulting Physician (Hematology and Oncology) Izell Domino, MD as Attending Physician (Radiation Oncology) Vernetta Berg, MD as Consulting Physician (General Surgery) Tyree Nanetta SAILOR, RN as Oncology Nurse Navigator Glean Stephane BROCKS, RN as Oncology Nurse Navigator  DIAGNOSIS:  Encounter Diagnosis  Name Primary?   Malignant neoplasm of upper-outer quadrant of right breast in female, estrogen receptor positive (HCC) Yes    SUMMARY OF ONCOLOGIC HISTORY: Oncology History  Malignant neoplasm of upper-outer quadrant of right breast in female, estrogen receptor positive (HCC)  03/04/2023 Initial Diagnosis   Palpable right breast lump UOQ 12 o'clock position: 1.7 cm by ultrasound, 3 abnormal lymph nodes: 1 biopsy positive with extranodal extension.  Biopsy of the lump: Grade 2 IDC with medullary features ER 60% weak, PR 0%, Ki-67 80%, HER2 negative   03/16/2023 Cancer Staging   Staging form: Breast, AJCC 8th Edition - Clinical: Stage IIA (cT1c, cN1, cM0, G2, ER+, PR-, HER2-) - Signed by Odean Potts, MD on 03/16/2023 Stage prefix: Initial diagnosis Histologic grading system: 3 grade system   03/24/2023 Genetic Testing   Negative [facility] Common Hereditary Cancers +RNA Panel.  Report date is 03/24/2023.    The [employer] Common Hereditary Cancers + RNA Panel includes sequencing, deletion/duplication, and RNA analysis of the following 48 genes: APC, ATM, AXIN2, BAP1, BARD1, BMPR1A, BRCA1, BRCA2, BRIP1, CDH1, CDK4*, CDKN2A*, CHEK2, CTNNA1, DICER1, EPCAM* (del/dup only), FH, GREM1* (promoter dup analysis only), HOXB13*, KIT*, MBD4*, MEN1, MLH1, MSH2, MSH3, MSH6, MUTYH, NF1, NTHL1, PALB2, PDGFRA*, PMS2, POLD1, POLE, PTEN, RAD51C, RAD51D, SDHA (sequencing only), SDHB, SDHC, SDHD, SMAD4,  SMARCA4, STK11, TP53, TSC1, TSC2, VHL.  *Genes without RNA analysis.     03/25/2023 - 05/26/2023 Chemotherapy   Patient is on Treatment Plan : BREAST TC q21d x 4 cycles     07/05/2023 Surgery   Right lumpectomy: Grade 2 IDC 1.5 cm, ER 60%, PR 0%, HER2 0, Ki67 80%, 1/9 lymph nodes positive (repeat prognostic panel: ER 0%, PR 0%, Ki67 30%, HER2 1+)    08/02/2023 Cancer Staging   Staging form: Breast, AJCC 8th Edition - Pathologic stage from 08/02/2023: ypT1c, pN1a, cM0, G2, ER-, PR-, HER2- - Signed by Izell Domino, MD on 08/22/2023 Stage prefix: Post-therapy Response to neoadjuvant therapy: Partial response Method of lymph node assessment: Axillary lymph node dissection Multigene prognostic tests performed: Other Histologic grading system: 3 grade system     CHIEF COMPLIANT: Follow-up towards end of radiation  HISTORY OF PRESENT ILLNESS:   History of Present Illness   The patient, with a history of breast cancer, is nearing the end of radiation therapy. She was found to have residual cancer after chemotherapy and surgical resection, placing her at a higher risk of recurrence. The original tumor was 3 cm and shrunk to 2 cm after chemotherapy. The patient's cancer was staged as 2A. She was also found to have one positive lymph node out of nine removed. The patient is not currently experiencing any bowel issues, but reports constipation.         ALLERGIES:  is allergic to sertraline  hcl and excedrin tension headache [acetaminophen -caffeine].  MEDICATIONS:  Current Outpatient Medications  Medication Sig Dispense Refill   [START ON 10/03/2023] capecitabine  (XELODA ) 500 MG tablet Take 2 tablets (1,000 mg total) by mouth 2 (two) times daily after a meal. Take twice daily for  14 days and off for 7 days 56 tablet 7   atorvastatin  (LIPITOR) 10 MG tablet Take 1 tablet (10 mg total) by mouth daily. 90 tablet 1   estradiol  (ESTRACE ) 0.1 MG/GM vaginal cream Place 1 Applicatorful vaginally at  bedtime.     omeprazole  (PRILOSEC) 20 MG capsule Take 1 capsule (20 mg total) by mouth every other day. On an empty stomach 45 capsule 1   sodium fluoride (PREVIDENT 5000 PLUS) 1.1 % CREA dental cream Take by mouth.     sulfamethoxazole -trimethoprim  (BACTRIM  DS) 800-160 MG tablet Take 1 tablet by mouth 2 (two) times daily. 14 tablet 0   No current facility-administered medications for this visit.    PHYSICAL EXAMINATION: ECOG PERFORMANCE STATUS: 1 - Symptomatic but completely ambulatory  Vitals:   09/22/23 1157  BP: 130/62  Pulse: 66  Resp: 17  Temp: 97.6 F (36.4 C)  SpO2: 99%   Filed Weights   09/22/23 1157  Weight: 117 lb 7 oz (53.3 kg)      LABORATORY DATA:  I have reviewed the data as listed    Latest Ref Rng & Units 05/26/2023   11:52 AM 05/06/2023    9:22 AM 04/15/2023    8:16 AM  CMP  Glucose 70 - 99 mg/dL 90  99  82   BUN 8 - 23 mg/dL 14  12  15    Creatinine 0.44 - 1.00 mg/dL 9.52  9.47  9.38   Sodium 135 - 145 mmol/L 139  139  140   Potassium 3.5 - 5.1 mmol/L 3.7  3.8  3.5   Chloride 98 - 111 mmol/L 108  107  108   CO2 22 - 32 mmol/L 26  27  25    Calcium  8.9 - 10.3 mg/dL 9.0  9.3  8.8   Total Protein 6.5 - 8.1 g/dL 6.1  6.4  6.1   Total Bilirubin 0.3 - 1.2 mg/dL 0.8  1.0  0.9   Alkaline Phos 38 - 126 U/L 55  72  66   AST 15 - 41 U/L 15  16  17    ALT 0 - 44 U/L 10  14  15      Lab Results  Component Value Date   WBC 9.7 05/26/2023   HGB 9.4 (L) 05/26/2023   HCT 29.2 (L) 05/26/2023   MCV 98.0 05/26/2023   PLT 220 05/26/2023   NEUTROABS 6.5 05/26/2023    ASSESSMENT & PLAN:  Malignant neoplasm of upper-outer quadrant of right breast in female, estrogen receptor positive (HCC) 03/04/2023:Palpable right breast lump UOQ 12 o'clock position: 1.7 cm by ultrasound, 3 abnormal lymph nodes: 1 biopsy positive with extranodal extension.  Biopsy of the lump: Grade 2 IDC with medullary features ER 60% weak, PR 0%, Ki-67 80%, HER2 negative  03/24/2023: Breast MRI:  Biopsy-proven malignancy 1.8 cm, non-mass enhancement measuring 3 cm, biopsy-proven axillary lymph node.  MRI guided biopsy is being planned for 04/01/2023   Treatment plan: Neoadjuvant chemotherapy with Taxotere  and Cytoxan  every 3 weeks x 4 completed 05/26/2023 Breast conserving surgery with targeted node dissection: 07/05/2023: Grade 2 IDC 1.5 cm, ER 60%, PR 0%, HER2 0, Ki67 80%, 1/9 lymph nodes positive (repeat prognostic panel: ER 0%, PR 0%, Ki67 30%, HER2 1+) Adjuvant radiation 08/17/2023-09/29/2023 No role of adjuvant antiestrogen therapy since she is ER negative --------------------------------------------------------------------------------------------- Discussed with the patient about adjuvant Xeloda  for 6 months. Breast cancer surveillance: We will also add guardant reveal for MRD testing in addition to annual mammograms. ------------------------------------- Assessment  and Plan    Breast Cancer (Stage 2A) Completed chemotherapy and surgery, with residual tumor noted in surgical specimen. Currently undergoing radiation therapy. Discussed the increased risk of recurrence due to residual tumor post-chemotherapy and the potential benefit of adjuvant capecitabine  (Xeloda ) therapy. -Start capecitabine  (Xeloda ) 2 tablets BID for 14 days on, 7 days off, for a total of 6 months, starting on 10/03/2023. -Encourage moisturizing hands and feet to prevent potential hand-foot syndrome. -Check baseline labs prior to starting capecitabine . -Plan for pharmacist education regarding capecitabine . -Follow-up in 2 weeks after starting capecitabine  for blood work, and again in another 2 weeks. Thereafter, monthly blood work.  General Health Maintenance / Followup Plans -Plan for home blood work every 3 months, to start after completion of 6 months of capecitabine  therapy.      Follow-up with [facility] to have education regarding capecitabine  with blood work    Orders Placed This Encounter  Procedures    CBC with Differential (Cancer Center Only)    Standing Status:   Future    Expiration Date:   09/21/2024   CMP (Cancer Center only)    Standing Status:   Future    Expiration Date:   09/21/2024   The patient has a good understanding of the overall plan. she agrees with it. she will call with any problems that may develop before the next visit here. Total time spent: 30 mins including face to face time and time spent for planning, charting and co-ordination of care   Naomi MARLA Chad, MD 09/22/23

## 2023-09-22 NOTE — Telephone Encounter (Signed)
 Oral Oncology Patient Advocate Encounter   Received notification that prior authorization for capecitabine  is required.   PA submitted on 09/22/23 Key [id number] Status is pending     Estefana Moellers, CPhT-Adv Oncology Pharmacy Patient Advocate [facility] Direct Number: [phone]  Fax: [phone]

## 2023-09-22 NOTE — Telephone Encounter (Signed)
 Oral Oncology Patient Advocate Encounter   Was successful in securing patient a $6,500 grant from [employer] (PAF) to provide copayment coverage for capecitabine .  This will keep the out of pocket expense at $0.    The billing information is as follows and has been shared with [facility].   RxBin: [id number] PCN:  [id number] Member ID: [id number] Group ID: [id number] Dates of Eligibility: 09/22/23 through 09/21/24  Heidi Mitchell, CPhT-Adv Oncology Pharmacy Patient Advocate [facility] Direct Number: [phone]  Fax: [phone]

## 2023-09-23 ENCOUNTER — Other Ambulatory Visit (HOSPITAL_COMMUNITY): Payer: Self-pay

## 2023-09-23 ENCOUNTER — Ambulatory Visit: Payer: Medicare PPO

## 2023-09-23 NOTE — Telephone Encounter (Signed)
 Oral Oncology Patient Advocate Encounter  Prior Authorization for capecitabine  has been approved under Medicare B [facility])   PA# [id number] Effective dates: 09/22/23 through 09/12/24  Patients co-pay is $0.    Estefana Moellers, CPhT-Adv Oncology Pharmacy Patient Advocate [facility] Direct Number: [phone]  Fax: [phone]

## 2023-09-24 ENCOUNTER — Other Ambulatory Visit: Payer: Self-pay

## 2023-09-26 ENCOUNTER — Other Ambulatory Visit: Payer: Self-pay

## 2023-09-26 ENCOUNTER — Ambulatory Visit
Admission: RE | Admit: 2023-09-26 | Discharge: 2023-09-26 | Disposition: A | Discharge status: Home / Self Care | Payer: Medicare PPO | Source: Ambulatory Visit | Attending: Radiation Oncology | Admitting: Radiation Oncology

## 2023-09-26 ENCOUNTER — Other Ambulatory Visit: Payer: Self-pay | Admitting: Pharmacy Technician

## 2023-09-26 ENCOUNTER — Ambulatory Visit: Payer: Medicare PPO

## 2023-09-26 ENCOUNTER — Other Ambulatory Visit (HOSPITAL_COMMUNITY): Payer: Self-pay

## 2023-09-26 DIAGNOSIS — Z170 Estrogen receptor positive status [ER+]: Secondary | ICD-10-CM | POA: Diagnosis not present

## 2023-09-26 DIAGNOSIS — Z510 Encounter for antineoplastic radiation therapy: Secondary | ICD-10-CM | POA: Diagnosis not present

## 2023-09-26 DIAGNOSIS — C50411 Malignant neoplasm of upper-outer quadrant of right female breast: Secondary | ICD-10-CM | POA: Diagnosis not present

## 2023-09-26 DIAGNOSIS — Z79899 Other long term (current) drug therapy: Secondary | ICD-10-CM | POA: Diagnosis not present

## 2023-09-26 LAB — RAD ONC ARIA SESSION SUMMARY
Course Elapsed Days: 41
Plan Fractions Treated to Date: 1
Plan Prescribed Dose Per Fraction: 2 Gy
Plan Total Fractions Prescribed: 5
Plan Total Prescribed Dose: 10 Gy
Reference Point Dosage Given to Date: 2 Gy
Reference Point Session Dosage Given: 2 Gy
Session Number: 26

## 2023-09-26 NOTE — Progress Notes (Signed)
 Specialty Pharmacy Initial Fill Coordination Note  Heidi Mitchell is a 73 y.o. female contacted today regarding refills of specialty medication(s) Capecitabine  (XELODA ) .  Patient requested Delivery  on 09/28/23  to verified address [address]  [place] [address]   Medication will be filled on 09/27/23.   Patient is aware of $0 copayment.

## 2023-09-27 ENCOUNTER — Other Ambulatory Visit: Payer: Self-pay

## 2023-09-27 ENCOUNTER — Ambulatory Visit
Admission: RE | Admit: 2023-09-27 | Discharge: 2023-09-27 | Disposition: A | Discharge status: Home / Self Care | Payer: Medicare PPO | Source: Ambulatory Visit | Attending: Radiation Oncology

## 2023-09-27 ENCOUNTER — Ambulatory Visit: Payer: Medicare PPO

## 2023-09-27 DIAGNOSIS — C50411 Malignant neoplasm of upper-outer quadrant of right female breast: Secondary | ICD-10-CM | POA: Diagnosis not present

## 2023-09-27 DIAGNOSIS — Z510 Encounter for antineoplastic radiation therapy: Secondary | ICD-10-CM | POA: Diagnosis not present

## 2023-09-27 DIAGNOSIS — Z79899 Other long term (current) drug therapy: Secondary | ICD-10-CM | POA: Diagnosis not present

## 2023-09-27 DIAGNOSIS — Z170 Estrogen receptor positive status [ER+]: Secondary | ICD-10-CM | POA: Diagnosis not present

## 2023-09-27 LAB — RAD ONC ARIA SESSION SUMMARY
Course Elapsed Days: 42
Plan Fractions Treated to Date: 2
Plan Prescribed Dose Per Fraction: 2 Gy
Plan Total Fractions Prescribed: 5
Plan Total Prescribed Dose: 10 Gy
Reference Point Dosage Given to Date: 4 Gy
Reference Point Session Dosage Given: 2 Gy
Session Number: 27

## 2023-09-28 ENCOUNTER — Ambulatory Visit
Admission: RE | Admit: 2023-09-28 | Discharge: 2023-09-28 | Disposition: A | Discharge status: Home / Self Care | Payer: Medicare PPO | Source: Ambulatory Visit | Attending: Radiation Oncology | Admitting: Radiation Oncology

## 2023-09-28 ENCOUNTER — Ambulatory Visit: Payer: Medicare PPO

## 2023-09-28 ENCOUNTER — Inpatient Hospital Stay: Payer: Medicare PPO | Admitting: Pharmacist

## 2023-09-28 ENCOUNTER — Encounter: Payer: Self-pay | Admitting: Hematology and Oncology

## 2023-09-28 ENCOUNTER — Inpatient Hospital Stay: Payer: Medicare PPO

## 2023-09-28 ENCOUNTER — Other Ambulatory Visit: Payer: Self-pay

## 2023-09-28 VITALS — BP 125/51 | HR 59 | Temp 97.6°F | Resp 18 | Ht 60.0 in | Wt 117.6 lb

## 2023-09-28 DIAGNOSIS — Z79899 Other long term (current) drug therapy: Secondary | ICD-10-CM | POA: Diagnosis not present

## 2023-09-28 DIAGNOSIS — C50411 Malignant neoplasm of upper-outer quadrant of right female breast: Secondary | ICD-10-CM | POA: Diagnosis not present

## 2023-09-28 DIAGNOSIS — Z170 Estrogen receptor positive status [ER+]: Secondary | ICD-10-CM

## 2023-09-28 DIAGNOSIS — Z510 Encounter for antineoplastic radiation therapy: Secondary | ICD-10-CM | POA: Diagnosis not present

## 2023-09-28 LAB — CMP (CANCER CENTER ONLY)
ALT: 10 U/L (ref 0–44)
AST: 20 U/L (ref 15–41)
Albumin: 4.2 g/dL (ref 3.5–5.0)
Alkaline Phosphatase: 61 U/L (ref 38–126)
Anion gap: 5 (ref 5–15)
BUN: 15 mg/dL (ref 8–23)
CO2: 29 mmol/L (ref 22–32)
Calcium: 9.4 mg/dL (ref 8.9–10.3)
Chloride: 106 mmol/L (ref 98–111)
Creatinine: 0.57 mg/dL (ref 0.44–1.00)
GFR, Estimated: >60 mL/min (ref >60)
Glucose, Bld: 92 mg/dL (ref 70–99)
Potassium: 4.2 mmol/L (ref 3.5–5.1)
Sodium: 140 mmol/L (ref 135–145)
Total Bilirubin: 1.1 mg/dL (ref 0.0–1.2)
Total Protein: 6.6 g/dL (ref 6.5–8.1)

## 2023-09-28 LAB — RAD ONC ARIA SESSION SUMMARY
Course Elapsed Days: 43
Plan Fractions Treated to Date: 3
Plan Prescribed Dose Per Fraction: 2 Gy
Plan Total Fractions Prescribed: 5
Plan Total Prescribed Dose: 10 Gy
Reference Point Dosage Given to Date: 6 Gy
Reference Point Session Dosage Given: 2 Gy
Session Number: 28

## 2023-09-28 LAB — CBC WITH DIFFERENTIAL (CANCER CENTER ONLY)
Abs Immature Granulocytes: 0.02 10*3/uL (ref 0.00–0.07)
Basophils Absolute: 0 10*3/uL (ref 0.0–0.1)
Basophils Relative: 1 %
Eosinophils Absolute: 0.1 10*3/uL (ref 0.0–0.5)
Eosinophils Relative: 1 %
HCT: 38.4 % (ref 36.0–46.0)
Hemoglobin: 12.2 g/dL (ref 12.0–15.0)
Immature Granulocytes: 1 %
Lymphocytes Relative: 16 %
Lymphs Abs: 0.7 10*3/uL (ref 0.7–4.0)
MCH: 28 pg (ref 26.0–34.0)
MCHC: 31.8 g/dL (ref 30.0–36.0)
MCV: 88.1 fL (ref 80.0–100.0)
Monocytes Absolute: 0.6 10*3/uL (ref 0.1–1.0)
Monocytes Relative: 16 %
Neutro Abs: 2.6 10*3/uL (ref 1.7–7.7)
Neutrophils Relative %: 65 %
Platelet Count: 210 10*3/uL (ref 150–400)
RBC: 4.36 MIL/uL (ref 3.87–5.11)
RDW: 16.7 % — ABNORMAL HIGH (ref 11.5–15.5)
WBC Count: 4 10*3/uL (ref 4.0–10.5)
nRBC: 0 % (ref 0.0–0.2)

## 2023-09-28 NOTE — Progress Notes (Signed)
 [facility]       Telephone: [phone]?Fax: [phone]   Oncology Clinical Pharmacist Practitioner Initial Assessment  Heidi Mitchell is a 73 y.o. female with a diagnosis of breast cancer. They were contacted today via in-person visit.  Indication/Regimen Capecitabine  (Xeloda ) is being used appropriately for treatment of breast cancer in the adjuvant setting by Dr. Vinay Gudena.      Wt Readings from Last 1 Encounters:  09/28/23 117 lb 9.6 oz (53.3 kg)    Estimated body surface area is 1.5 meters squared as calculated from the following:   Height as of this encounter: 5' (1.524 m).   Weight as of this encounter: 117 lb 9.6 oz (53.3 kg).  The dosing regimen is 2 tablets (1000 mg) by mouth in the morning and 2 tablets (1000 mg) in the evening on days 1 to 14 of a 21-day cycle. . This is being given  monotherapy. It is planned to continue until  6 months per the Create-X trial data . Prescription dose and frequency assessed for appropriateness.  Patient has agreed to treatment which is documented in physician note on 09/22/23. Counseled patient on administration, dosing, side effects, monitoring, drug-food interactions, safe handling, storage, and disposal.  Radiation treatments will tentatively finish on 10/10/23 and Dr. Gudena would like her to start capecitabine  on 09/2023. He will then see her 3 weeks after starting.   Dose Modifications Dr. Lee Public is prescribing capecitabine  at 667 mg/m2 BID 14/21   Access Assessment KINGA FLATHERS will be receiving capecitabine  through [facility] Concerns: none Start date if known: Start after Dr. Lee Public visit week of 10/03/23  Adherence Assessment Reviewed importance on keeping a med schedule and plan for any missed doses Barriers to adherence identified? No  Allergies Allergies  Allergen Reactions   Sertraline  Hcl     Muscle spasms   Excedrin Tension Headache [Acetaminophen -Caffeine]  Anxiety    Vitals    09/28/2023   12:56 PM 09/22/2023   11:57 AM 08/03/2023    2:09 PM  Oncology Vitals  Height 152 cm 152 cm 152 cm  Weight 53.343 kg 53.269 kg 52.334 kg  Weight (lbs) 117 lbs 10 oz 117 lbs 7 oz 115 lbs 6 oz  BMI 22.97 kg/m2 22.94 kg/m2 22.53 kg/m2  Temp 97.6 F (36.4 C) 97.6 F (36.4 C) 100.1 F (37.8 C)  Pulse Rate 59 66 70  BP 125/51 130/62 120/70  Resp 18 17   SpO2 100 % 99 % 96 %  BSA (m2) 1.5 m2 1.5 m2 1.49 m2     Laboratory Data    Latest Ref Rng & Units 09/28/2023   11:29 AM 05/26/2023   11:52 AM 05/06/2023    9:22 AM  CBC EXTENDED  WBC 4.0 - 10.5 K/uL 4.0  9.7  9.7   RBC 3.87 - 5.11 MIL/uL 4.36  2.98  3.46   Hemoglobin 12.0 - 15.0 g/dL 29.5  9.4  62.1   HCT 30.8 - 46.0 % 38.4  29.2  32.6   Platelets 150 - 400 K/uL 210  220  245   NEUT# 1.7 - 7.7 K/uL 2.6  6.5  6.2   Lymph# 0.7 - 4.0 K/uL 0.7  2.0  2.0        Latest Ref Rng & Units 09/28/2023   11:29 AM 05/26/2023   11:52 AM 05/06/2023    9:22 AM  CMP  Glucose 70 - 99 mg/dL 92  90  99  BUN 8 - 23 mg/dL 15  14  12    Creatinine 0.44 - 1.00 mg/dL 9.62  9.52  8.41   Sodium 135 - 145 mmol/L 140  139  139   Potassium 3.5 - 5.1 mmol/L 4.2  3.7  3.8   Chloride 98 - 111 mmol/L 106  108  107   CO2 22 - 32 mmol/L 29  26  27    Calcium  8.9 - 10.3 mg/dL 9.4  9.0  9.3   Total Protein 6.5 - 8.1 g/dL 6.6  6.1  6.4   Total Bilirubin 0.0 - 1.2 mg/dL 1.1  0.8  1.0   Alkaline Phos 38 - 126 U/L 61  55  72   AST 15 - 41 U/L 20  15  16    ALT 0 - 44 U/L 10  10  14     Contraindications Contraindications were reviewed? Yes Contraindications to therapy were identified? No   Safety Precautions The following safety precautions for the use of capecitabine  were reviewed:  Fever: reviewed the importance of having a thermometer and the Centers for Disease Control and Prevention (CDC) definition of fever which is 100.3F (38C) or higher. Patient should call 24/7 triage at [phone] if experiencing a fever or any  other symptoms Decreased white blood cells (WBCs) and increased risk for infection Decreased hemoglobin, part of the red blood cells that carry iron and oxygen Diarrhea Mouth irritation or sores Pain or discomfort in hands and/or feet Changes in liver function Nausea or vomiting Fatigue Abdominal pain Decreased platelet count and increased risk of bleeding Interactions with warfarin Cardiotoxicity Kidney injury Dehydration Alopecia Handling body fluids and waste Pregnancy, sexual activity, and contraception Storage and Handling To be taken within 30 minutes of a meal Missed doses  Medication Reconciliation Current Outpatient Medications  Medication Sig Dispense Refill   estradiol  (ESTRACE ) 0.1 MG/GM vaginal cream Place 1 Applicatorful vaginally at bedtime.     omeprazole  (PRILOSEC) 20 MG capsule Take 1 capsule (20 mg total) by mouth every other day. On an empty stomach 45 capsule 1   atorvastatin  (LIPITOR) 10 MG tablet Take 1 tablet (10 mg total) by mouth daily. (Patient not taking: Reported on 09/28/2023) 90 tablet 1   [START ON 10/03/2023] capecitabine  (XELODA ) 500 MG tablet Take 2 tablets (1,000 mg total) by mouth 2 (two) times daily after a meal. Take twice daily for 14 days and off for 7 days (Patient not taking: Reported on 09/28/2023) 56 tablet 7   No current facility-administered medications for this visit.   Medication reconciliation is based on the patient's most recent medication list in the electronic medical record (EMR) including herbal products and OTC medications.   The patient's medication list was reviewed today with the patient? Yes   Drug-drug interactions (DDIs) DDIs were evaluated? Yes Significant DDIs identified? No , reviewed possibly interaction with omeprazole  but has taken this for quite some time as she was having issues with regurgitation.   Drug-Food Interactions Drug-food interactions were evaluated? Yes Drug-food interactions identified? No    Follow-up Plan  Patient education handout given to patient Start capecitabine  1000 mg by mouth every 12 hours for 14 days, followed by a 7 day rest period. She will start after radiation finishes. Radiation finishes on 09/30/23 and Dr. Gudena said starting 10/03/23 would be reasonable if she is feeling good Add labs, Dr. Lee Public visit on 10/24/23 Dr. Lee Public will have Ms. Purnell see clinical pharmacy as he deems necessary  TZIRY DITORO participated in  the discussion, expressed understanding, and voiced agreement with the above plan. All questions were answered to her satisfaction. The patient was advised to contact the clinic at [phone] with any questions or concerns prior to her return visit.   I spent 60 minutes assessing the patient.  Brendan Gruwell A. Webb Hake, PharmD, BCOP, CPP  Althea Atkinson, RPH-CPP, 09/28/2023 1:23 PM  **Disclaimer: This note was dictated with voice recognition software. Similar sounding words can inadvertently be transcribed and this note may contain transcription errors which may not have been corrected upon publication of note.**

## 2023-09-28 NOTE — Progress Notes (Signed)
 Patient counseled in clinic visit note on 09/28/23.

## 2023-09-29 ENCOUNTER — Other Ambulatory Visit: Payer: Self-pay

## 2023-09-29 ENCOUNTER — Ambulatory Visit: Payer: Medicare PPO

## 2023-09-29 ENCOUNTER — Ambulatory Visit
Admission: RE | Admit: 2023-09-29 | Discharge: 2023-09-29 | Disposition: A | Discharge status: Home / Self Care | Payer: Medicare PPO | Source: Ambulatory Visit | Attending: Radiation Oncology | Admitting: Radiation Oncology

## 2023-09-29 DIAGNOSIS — C50411 Malignant neoplasm of upper-outer quadrant of right female breast: Secondary | ICD-10-CM | POA: Diagnosis not present

## 2023-09-29 DIAGNOSIS — Z510 Encounter for antineoplastic radiation therapy: Secondary | ICD-10-CM | POA: Diagnosis not present

## 2023-09-29 DIAGNOSIS — Z170 Estrogen receptor positive status [ER+]: Secondary | ICD-10-CM | POA: Diagnosis not present

## 2023-09-29 DIAGNOSIS — Z79899 Other long term (current) drug therapy: Secondary | ICD-10-CM | POA: Diagnosis not present

## 2023-09-29 LAB — RAD ONC ARIA SESSION SUMMARY
Course Elapsed Days: 44
Plan Fractions Treated to Date: 4
Plan Prescribed Dose Per Fraction: 2 Gy
Plan Total Fractions Prescribed: 5
Plan Total Prescribed Dose: 10 Gy
Reference Point Dosage Given to Date: 8 Gy
Reference Point Session Dosage Given: 2 Gy
Session Number: 29

## 2023-09-30 ENCOUNTER — Other Ambulatory Visit: Payer: Self-pay

## 2023-09-30 ENCOUNTER — Ambulatory Visit
Admission: RE | Admit: 2023-09-30 | Discharge: 2023-09-30 | Disposition: A | Discharge status: Home / Self Care | Payer: Medicare PPO | Source: Ambulatory Visit | Attending: Radiation Oncology

## 2023-09-30 DIAGNOSIS — Z170 Estrogen receptor positive status [ER+]: Secondary | ICD-10-CM | POA: Diagnosis not present

## 2023-09-30 DIAGNOSIS — C50411 Malignant neoplasm of upper-outer quadrant of right female breast: Secondary | ICD-10-CM | POA: Diagnosis not present

## 2023-09-30 DIAGNOSIS — Z510 Encounter for antineoplastic radiation therapy: Secondary | ICD-10-CM | POA: Diagnosis not present

## 2023-09-30 DIAGNOSIS — Z79899 Other long term (current) drug therapy: Secondary | ICD-10-CM | POA: Diagnosis not present

## 2023-09-30 LAB — RAD ONC ARIA SESSION SUMMARY
Course Elapsed Days: 45
Plan Fractions Treated to Date: 5
Plan Prescribed Dose Per Fraction: 2 Gy
Plan Total Fractions Prescribed: 5
Plan Total Prescribed Dose: 10 Gy
Reference Point Dosage Given to Date: 10 Gy
Reference Point Session Dosage Given: 2 Gy
Session Number: 30

## 2023-10-01 ENCOUNTER — Telehealth: Payer: Self-pay | Admitting: Hematology and Oncology

## 2023-10-01 NOTE — Telephone Encounter (Signed)
Called and left message of scheduled appt. Provide contact number if rescheduling is needed.

## 2023-10-03 ENCOUNTER — Telehealth: Payer: Self-pay | Admitting: Hematology and Oncology

## 2023-10-03 ENCOUNTER — Inpatient Hospital Stay: Payer: Medicare PPO | Admitting: Pharmacist

## 2023-10-03 ENCOUNTER — Encounter: Payer: Self-pay | Admitting: Hematology and Oncology

## 2023-10-03 DIAGNOSIS — Z170 Estrogen receptor positive status [ER+]: Secondary | ICD-10-CM

## 2023-10-03 NOTE — Radiation Completion Notes (Signed)
Patient Name: Heidi Mitchell, Heidi Mitchell MRN: [id number] Date of Birth: 19-Feb-1951 Referring Physician: Serena Croissant, M.D. Date of Service: 2023-10-03 Radiation Oncologist: Lonie Peak, M.D. [facility]                             RADIATION ONCOLOGY END OF TREATMENT NOTE     Diagnosis: C50.411 Malignant neoplasm of upper-outer quadrant of right female breast Staging on 2023-08-02: Malignant neoplasm of upper-outer quadrant of right breast in female, estrogen receptor positive (HCC) T=pT1c, N=pN1a, M=cM0 Staging on 2023-03-16: Malignant neoplasm of upper-outer quadrant of right breast in female, estrogen receptor positive (HCC) T=cT1c, N=cN1, M=cM0 Intent: Curative     ==========DELIVERED PLANS==========  First Treatment Date: 2023-08-16 Last Treatment Date: 2023-09-30   Plan Name: Breast_R Site: Breast, Right Technique: 3D Mode: Photon Dose Per Fraction: 2 Gy Prescribed Dose (Delivered / Prescribed): 50 Gy / 50 Gy Prescribed Fxs (Delivered / Prescribed): 25 / 25   Plan Name: Breast_R_PAB_ Site: Breast, Right Technique: 3D Mode: Photon Dose Per Fraction: 2 Gy Prescribed Dose (Delivered / Prescribed): 50 Gy / 50 Gy Prescribed Fxs (Delivered / Prescribed): 25 / 25   Plan Name: Breast_R_Bst Site: Breast, Right Technique: 3D Mode: Photon Dose Per Fraction: 2 Gy Prescribed Dose (Delivered / Prescribed): 10 Gy / 10 Gy Prescribed Fxs (Delivered / Prescribed): 5 / 5     ==========ON TREATMENT VISIT DATES========== 2023-08-22, 2023-08-29, 2023-09-05, 2023-09-09, 2023-09-19, 2023-09-26     ==========UPCOMING VISITS========== 11/03/2023 [facility]-RADIATION ONC FOLLOW UP 20 Lonie Peak, MD  10/25/2023 [facility]-MED ONCOLOGY EST PT 15 Serena Croissant, MD  10/25/2023 [facility] ONCOLOGY LAB [facility]ONC LAB  10/10/2023 [facility] Karie Schwalbe, MD  10/03/2023 [facility] PHARMACY CLINIC [place], John A, RPH-CPP         ==========APPENDIX - ON TREATMENT VISIT NOTES==========   See weekly On Treatment Notes in Epic for details in the Media tab (listed as Progress notes on the On Treatment Visit Dates listed above).

## 2023-10-03 NOTE — Telephone Encounter (Signed)
Patient called and needed to move appointment a later time. Appointment was scheduled an hour later.

## 2023-10-03 NOTE — Progress Notes (Signed)
[facility]       Telephone: [phone]?Fax: [phone]   Oncology Clinical Pharmacist Practitioner Progress Note  Heidi Mitchell is a 73 y.o. female with a diagnosis of breast cancer currently on capecitabine in the adjuvant setting under the care of Dr. Serena Croissant.   I connected with Heidi Mitchell today by telephone and verified that I was speaking with the correct person using two patient identifiers. I discussed the limitations, risks, security and privacy concerns of performing an evaluation and management service by telemedicine and the availability of in-person appointments. The patient/caregiver expressed understanding and agreed to proceed.  Other persons participating in the visit and their role in the encounter: none   Patient's location: home  Provider's location: clinic  Heidi Mitchell had some additional questions on side effects regarding capecitabine. She started the medication this morning. Possible side effects were again reviewed as well as administration instructions.  She will see Dr. Pamelia Hoit with labs on 10/25/23.  Heidi Mitchell participated in the discussion, expressed understanding, and voiced agreement with the above plan. All questions were answered to her satisfaction. The patient was advised to contact the clinic at [phone] with any questions or concerns prior to her return visit.  Clinical pharmacy will continue to support Heidi Mitchell and Dr. Serena Croissant as needed.  Heidi Mitchell, PharmD, BCOP, CPP  Anselm Lis, RPH-CPP,  10/03/2023  9:58 AM   **Disclaimer: This note was dictated with voice recognition software. Similar sounding words can inadvertently be transcribed and this note may contain transcription errors which may not have been corrected upon publication of note.**

## 2023-10-07 ENCOUNTER — Encounter: Payer: Medicare HMO | Admitting: Internal Medicine

## 2023-10-07 ENCOUNTER — Other Ambulatory Visit: Payer: Self-pay

## 2023-10-10 ENCOUNTER — Ambulatory Visit (INDEPENDENT_AMBULATORY_CARE_PROVIDER_SITE_OTHER): Payer: Medicare PPO | Admitting: Internal Medicine

## 2023-10-10 ENCOUNTER — Other Ambulatory Visit: Payer: Self-pay

## 2023-10-10 ENCOUNTER — Encounter: Payer: Self-pay | Admitting: Internal Medicine

## 2023-10-10 VITALS — BP 114/72 | HR 59 | Temp 97.8°F | Ht 59.75 in | Wt 117.0 lb

## 2023-10-10 DIAGNOSIS — R1319 Other dysphagia: Secondary | ICD-10-CM | POA: Diagnosis not present

## 2023-10-10 DIAGNOSIS — C50411 Malignant neoplasm of upper-outer quadrant of right female breast: Secondary | ICD-10-CM | POA: Diagnosis not present

## 2023-10-10 DIAGNOSIS — Z0000 Encounter for general adult medical examination without abnormal findings: Secondary | ICD-10-CM | POA: Insufficient documentation

## 2023-10-10 DIAGNOSIS — E785 Hyperlipidemia, unspecified: Secondary | ICD-10-CM

## 2023-10-10 DIAGNOSIS — Z170 Estrogen receptor positive status [ER+]: Secondary | ICD-10-CM | POA: Diagnosis not present

## 2023-10-10 DIAGNOSIS — N952 Postmenopausal atrophic vaginitis: Secondary | ICD-10-CM | POA: Diagnosis not present

## 2023-10-10 NOTE — Assessment & Plan Note (Signed)
Has been off atorvastatin Discussed this--will hold off restarting primary prevention for now--till done with the breast cancer Rx

## 2023-10-10 NOTE — Progress Notes (Signed)
Hearing Screening - Comments:: Did not pass whisper test Vision Screening - Comments:: May 2024

## 2023-10-10 NOTE — Assessment & Plan Note (Signed)
I have personally reviewed the Medicare Annual Wellness questionnaire and have noted 1. The patient's medical and social history 2. Their use of alcohol, tobacco or illicit drugs 3. Their current medications and supplements 4. The patient's functional ability including ADL's, fall risks, home safety risks and hearing or visual             impairment. 5. Diet and physical activities 6. Evidence for depression or mood disorders  The patients weight, height, BMI and visual acuity have been recorded in the chart I have made referrals, counseling and provided education to the patient based review of the above and I have provided the pt with a written personalized care plan for preventive services.  I have provided you with a copy of your personalized plan for preventive services. Please take the time to review along with your updated medication list.  Overdue for colon--will contact Dr Tobi Bastos about this Mammograms now per oncology No paps due to age Prefers no vaccines--but will consider Td at pharmacy Is getting back to exercise

## 2023-10-10 NOTE — Assessment & Plan Note (Signed)
This has resolved Continues on omeprazole 20mg  every other day now

## 2023-10-10 NOTE — Assessment & Plan Note (Signed)
Now on xeloda--follows with Dr Pamelia Hoit

## 2023-10-10 NOTE — Assessment & Plan Note (Signed)
Asked her to use generic vaginal lubricant now--instead of the estrogen--due to her breast cancer Needs to get back in with Dr Valentino Saxon to recheck the pessary

## 2023-10-10 NOTE — Progress Notes (Signed)
Subjective:    Patient ID: Heidi Mitchell, female    DOB: September 27, 1950, 73 y.o.   MRN: [id number]  HPI Here for Medicare wellness visit and follow up of chronic health conditions Reviewed advanced directives Reviewed other doctors----Dr Gudena--oncology, Dr Lunette Stands surgery, Dr Merton Border, Dr Harvie Heck, [facility] Had lumpectomy and axillary dissection on right--in October No other surgery in the past year---no hospitalizations Vision is okay--some burning in right eye Hearing is not great--discussed audiology evaluation No tobacco--no alcohol Not much exercise lately--tires out easily. Plans to restart No falls No depression or anhedonia Independent with instrumental ADLs Some recall issues---no functional memory issues  Had neoadjuvant treatment --chemo  Then the surgery Then radiation --now on xeloda (1 positive node) Uses the estrogen cream--only before inserting pessary. It  is outdated though (discussed my concerns about this with the breast cancer) Does get some bleeding at times--when the pessary moves. Overdue for gyn visit--needs to set up appt  Has not been taking the atorvastatin lately Felt she didn't want to take anything not absolutely necessary  Uses the omeprazole every other day or less No sig heartburn--rare heartburn eating late meal with the xeloda No dysphagia  Current Outpatient Medications on File Prior to Visit  Medication Sig Dispense Refill   capecitabine (XELODA) 500 MG tablet Take 2 tablets (1,000 mg total) by mouth 2 (two) times daily after a meal. Take twice daily for 14 days and off for 7 days 56 tablet 7   estradiol (ESTRACE) 0.1 MG/GM vaginal cream Place 1 Applicatorful vaginally at bedtime.     omeprazole (PRILOSEC) 20 MG capsule Take 1 capsule (20 mg total) by mouth every other day. On an empty stomach 45 capsule 1   No current facility-administered medications on file prior to visit.    Allergies  Allergen Reactions    Sertraline Hcl     Muscle spasms   Excedrin Tension Headache [Acetaminophen-Caffeine] Anxiety    Past Medical History:  Diagnosis Date   GERD (gastroesophageal reflux disease)    Hyperlipidemia    Intracervical pessary    Unspecified disorder of bladder    Uterine prolapse     Past Surgical History:  Procedure Laterality Date   APPENDECTOMY  1990   bartholins cyst  1978   BREAST BIOPSY Left    benign   BREAST BIOPSY Right 03/04/2023   [name] RT BREAST BX W LOC DEV 1ST LESION IMG BX SPEC US GUIDE 03/04/2023 [facility]   BREAST BIOPSY  07/04/2023   [name] RT RADIOACTIVE SEED LOC 07/04/2023 [facility]   BREAST BIOPSY  07/04/2023   MM RT RADIOACTIVE SEED LOC MAMMO GUIDE 07/04/2023 [facility]   BREAST LUMPECTOMY WITH RADIOACTIVE SEED AND AXILLARY LYMPH NODE DISSECTION Right 07/05/2023   Procedure: RADIOACTIVE SEED GUIDED RIGHT BREAST LUMPECTOMY AND TARGETED RIGHT AXILLARY LYMPH NODE DISSECTION;  Surgeon: [name], MD;  Location: [facility];  Service: General;  Laterality: Right;  LMA PEC BLOCK   COLONOSCOPY WITH PROPOFOL N/A 09/27/2018   Procedure: COLONOSCOPY WITH PROPOFOL;  Surgeon: Wyline Mood, MD;  Location: [facility] ENDOSCOPY;  Service: Gastroenterology;  Laterality: N/A;   IR IMAGING GUIDED PORT INSERTION  03/24/2023   PORT-A-CATH REMOVAL Left 07/05/2023   Procedure: REMOVAL PORT-A-CATH;  Surgeon: [name], MD;  Location: [facility];  Service: General;  Laterality: Left;    Family History  Problem Relation Age of Onset   Alcohol abuse Mother    Cervical cancer Sister 80   Breast cancer Maternal  Aunt        dx 39s   Other Son        hole in his brain due to been dropped when he was born    Social History   Socioeconomic History   Marital status: Married    Spouse name: Mitzie Na   Number of children: 1   Years of education: high school   Highest education level: Not on file  Occupational History   Not on  file  Tobacco Use   Smoking status: Never    Passive exposure: Current   Smokeless tobacco: Never  Vaping Use   Vaping status: Never Used  Substance and Sexual Activity   Alcohol use: No   Drug use: No   Sexual activity: Not Currently    Birth control/protection: Abstinence  Other Topics Concern   Not on file  Social History Narrative   Does not have living will   Husband and son aware of wishes.   Pt would desires CPR but would not want prolonged life support if futile.   03/06/19   From: the area   Living: husband, Mitzie Na   Work: retired, but finances OK      Family: Son, Marcial Pacas who lives nearby, 2 grandchildren (84 and 49 yo), and a Physicist, medical on the way                 Social Drivers of Health   Financial Resource Strain: Low Risk  (08/31/2022)   Overall Financial Resource Strain (CARDIA)    Difficulty of Paying Living Expenses: Not hard at all  Food Insecurity: No Food Insecurity (08/02/2023)   Hunger Vital Sign    Worried About Running Out of Food in the Last Year: Never true    Ran Out of Food in the Last Year: Never true  Transportation Needs: No Transportation Needs (08/02/2023)   PRAPARE - Administrator, Civil Service (Medical): No    Lack of Transportation (Non-Medical): No  Physical Activity: Insufficiently Active (08/31/2022)   Exercise Vital Sign    Days of Exercise per Week: 7 days    Minutes of Exercise per Session: 20 min  Stress: No Stress Concern Present (08/31/2022)   [employer] of Occupational Health - Occupational Stress Questionnaire    Feeling of Stress : Not at all  Social Connections: Moderately Integrated (08/27/2021)   Social Connection and Isolation Panel [NHANES]    Frequency of Communication with Friends and Family: More than three times a week    Frequency of Social Gatherings with Friends and Family: More than three times a week    Attends Religious Services: 1 to 4 times per year    Active Member of  [employer] or Organizations: No    Attends Banker Meetings: Never    Marital Status: Married  Catering manager Violence: Not At Risk (08/02/2023)   Humiliation, Afraid, Rape, and Kick questionnaire    Fear of Current or Ex-Partner: No    Emotionally Abused: No    Physically Abused: No    Sexually Abused: No   Review of Systems Appetite is good Weight is stable Wears seat belt Teeth are okay---keeps up with dentist No suspicious skin lesions Slight chest pressure---goes back years (with negative stress test after it started) No SOB Slight dizziness just getting out of bed--then no more. No syncope No edema Bowels move okay--no blood Voids okay No sig back or joint pains    Objective:   Physical Exam Constitutional:  Appearance: Normal appearance.  HENT:     Mouth/Throat:     Pharynx: No oropharyngeal exudate or posterior oropharyngeal erythema.  Eyes:     Conjunctiva/sclera: Conjunctivae normal.     Pupils: Pupils are equal, round, and reactive to light.  Cardiovascular:     Rate and Rhythm: Normal rate and regular rhythm.     Pulses: Normal pulses.     Heart sounds: No murmur heard.    No gallop.  Pulmonary:     Effort: Pulmonary effort is normal.     Breath sounds: Normal breath sounds. No wheezing or rales.  Abdominal:     Palpations: Abdomen is soft.     Tenderness: There is no abdominal tenderness.  Musculoskeletal:     Cervical back: Neck supple.     Right lower leg: No edema.     Left lower leg: No edema.  Lymphadenopathy:     Cervical: No cervical adenopathy.  Skin:    Findings: No rash.  Neurological:     General: No focal deficit present.     Mental Status: She is alert and oriented to person, place, and time.     Comments: Mini-cog---normal  Psychiatric:        Mood and Affect: Mood normal.        Behavior: Behavior normal.            Assessment & Plan:

## 2023-10-11 ENCOUNTER — Telehealth: Payer: Self-pay

## 2023-10-11 NOTE — Telephone Encounter (Signed)
Called patient to let her know that Dr. Alphonsus Sias had reached out to Dr. Tobi Bastos so we could schedule her a colonoscopy. Patient stated that she had an appointment coming up with her oncologist on 10/25/2023 and that she wanted to ask if she was able to do a colonoscopy since she is taking chemo medications. She stated that once she sees her oncologist, that she would give [name] a call back so we could schedule it if she was given the okay to proceed from her oncologist.

## 2023-10-11 NOTE — Telephone Encounter (Signed)
Noted

## 2023-10-11 NOTE — Telephone Encounter (Signed)
-----   Message from Wyline Mood sent at 10/10/2023 10:59 AM EST ----- Kandis Cocking schedule surveillance colonoscopy please- h/o colon polyps  Thank you Dr Alphonsus Sias for the catch not sure why letter wasn't sent   Regards  Kiran ----- Message ----- From: Karie Schwalbe, MD Sent: 10/10/2023  10:29 AM EST To: Wyline Mood, MD  Kiran, she had 3 year recall on colonoscopy for 2023. She doesn't remember being contacted but is okay with proceeding. Can you please have your staff set this up? RIch

## 2023-10-12 ENCOUNTER — Other Ambulatory Visit: Payer: Self-pay

## 2023-10-12 NOTE — Progress Notes (Signed)
Specialty Pharmacy Refill Coordination Note  Heidi Mitchell is a 73 y.o. female contacted today regarding refills of specialty medication(s) Capecitabine (XELODA)   Patient requested Delivery   Delivery date: 10/19/23   Verified address: [address]  [place] [address]   Medication will be filled on 10/18/23.

## 2023-10-12 NOTE — Progress Notes (Signed)
Specialty Pharmacy Ongoing Clinical Assessment Note  Heidi Mitchell is a 73 y.o. female who is being followed by the specialty pharmacy service for RxSp Oncology   Patient's specialty medication(s) reviewed today: Capecitabine (XELODA)   Missed doses in the last 4 weeks: 0   Patient/Caregiver asked additional questions regarding whether she could/should have a colonoscopy during Xeloda therapy. Referred her to nurse/provider in regards to this. Additionally she asked about the specific place in therapy for Xeloda. Advised that it is being used post chemotherapy to decrease risk of recurrence per Dr. Earmon Phoenix notes and if she has further questions as to rationale to refer to provider as well.  Therapeutic benefit summary: Unable to assess   Adverse events/side effects summary: Experienced adverse events/side effects (mild diarrhea for the first time today, patient has immodium on hand if it continues)   Patient's therapy is appropriate to: Continue    Goals Addressed             This Visit's Progress    Maintain optimal adherence to therapy   On track    Patient is initiating therapy. Patient will maintain adherence         Follow up:  3 months  Otto Herb Specialty Pharmacist

## 2023-10-21 NOTE — Progress Notes (Incomplete)
  Heidi Mitchell presents today for a telephone follow-up after completing radiation to her right breast on 09/30/2023.  Pain: Patient denies any breast pain. Skin: Patient having red bumps on right breast where radiation was. Patient stated she experienced this before but has subsided. ROM: Patient denies having issues with range of motion.  Lymphedema: Patient denies any lymphedema. She does have a little bit of swelling on her right breast. MedOnc F/U: Follow up with Dr. Pamelia Hoit on November 22, 2023. Other issues of note:  Patient denies any other issues. Encouraged patient to keep up with follow up appointments and any upcoming mammograms. Educated patient to use vitamin E oil after red bumps heal.  Pt reports Yes No Comments  Tamoxifen []  [x]    Letrozole []  [x]    Anastrazole []  [x]    Mammogram [x]  Date: June 2024 []

## 2023-10-24 ENCOUNTER — Other Ambulatory Visit: Payer: Self-pay

## 2023-10-24 DIAGNOSIS — Z170 Estrogen receptor positive status [ER+]: Secondary | ICD-10-CM

## 2023-10-25 ENCOUNTER — Other Ambulatory Visit: Payer: Medicare PPO

## 2023-10-25 ENCOUNTER — Inpatient Hospital Stay: Payer: Medicare PPO | Admitting: Hematology and Oncology

## 2023-10-25 ENCOUNTER — Inpatient Hospital Stay: Payer: Medicare PPO | Attending: Hematology and Oncology

## 2023-10-25 ENCOUNTER — Ambulatory Visit: Payer: Medicare PPO | Admitting: Hematology and Oncology

## 2023-10-25 VITALS — BP 125/38 | HR 60 | Temp 97.9°F | Resp 17 | Ht 59.0 in | Wt 116.9 lb

## 2023-10-25 DIAGNOSIS — E785 Hyperlipidemia, unspecified: Secondary | ICD-10-CM | POA: Diagnosis not present

## 2023-10-25 DIAGNOSIS — Z803 Family history of malignant neoplasm of breast: Secondary | ICD-10-CM | POA: Insufficient documentation

## 2023-10-25 DIAGNOSIS — R21 Rash and other nonspecific skin eruption: Secondary | ICD-10-CM | POA: Diagnosis not present

## 2023-10-25 DIAGNOSIS — Z170 Estrogen receptor positive status [ER+]: Secondary | ICD-10-CM

## 2023-10-25 DIAGNOSIS — K219 Gastro-esophageal reflux disease without esophagitis: Secondary | ICD-10-CM | POA: Insufficient documentation

## 2023-10-25 DIAGNOSIS — R110 Nausea: Secondary | ICD-10-CM | POA: Diagnosis not present

## 2023-10-25 DIAGNOSIS — C50411 Malignant neoplasm of upper-outer quadrant of right female breast: Secondary | ICD-10-CM | POA: Insufficient documentation

## 2023-10-25 DIAGNOSIS — H1013 Acute atopic conjunctivitis, bilateral: Secondary | ICD-10-CM | POA: Insufficient documentation

## 2023-10-25 DIAGNOSIS — Z9221 Personal history of antineoplastic chemotherapy: Secondary | ICD-10-CM | POA: Diagnosis not present

## 2023-10-25 DIAGNOSIS — Z79899 Other long term (current) drug therapy: Secondary | ICD-10-CM | POA: Diagnosis not present

## 2023-10-25 DIAGNOSIS — R197 Diarrhea, unspecified: Secondary | ICD-10-CM | POA: Insufficient documentation

## 2023-10-25 LAB — CMP (CANCER CENTER ONLY)
ALT: 8 U/L (ref 0–44)
AST: 17 U/L (ref 15–41)
Albumin: 4.1 g/dL (ref 3.5–5.0)
Alkaline Phosphatase: 64 U/L (ref 38–126)
Anion gap: 7 (ref 5–15)
BUN: 15 mg/dL (ref 8–23)
CO2: 28 mmol/L (ref 22–32)
Calcium: 9.2 mg/dL (ref 8.9–10.3)
Chloride: 108 mmol/L (ref 98–111)
Creatinine: 0.76 mg/dL (ref 0.44–1.00)
GFR, Estimated: >60 mL/min (ref >60)
Glucose, Bld: 110 mg/dL — ABNORMAL HIGH (ref 70–99)
Potassium: 3.5 mmol/L (ref 3.5–5.1)
Sodium: 143 mmol/L (ref 135–145)
Total Bilirubin: 2.1 mg/dL — ABNORMAL HIGH (ref 0.0–1.2)
Total Protein: 6.1 g/dL — ABNORMAL LOW (ref 6.5–8.1)

## 2023-10-25 LAB — CBC WITH DIFFERENTIAL (CANCER CENTER ONLY)
Abs Immature Granulocytes: 0.02 10*3/uL (ref 0.00–0.07)
Basophils Absolute: 0 10*3/uL (ref 0.0–0.1)
Basophils Relative: 1 %
Eosinophils Absolute: 0.2 10*3/uL (ref 0.0–0.5)
Eosinophils Relative: 6 %
HCT: 38.4 % (ref 36.0–46.0)
Hemoglobin: 12.2 g/dL (ref 12.0–15.0)
Immature Granulocytes: 1 %
Lymphocytes Relative: 15 %
Lymphs Abs: 0.6 10*3/uL — ABNORMAL LOW (ref 0.7–4.0)
MCH: 29.4 pg (ref 26.0–34.0)
MCHC: 31.8 g/dL (ref 30.0–36.0)
MCV: 92.5 fL (ref 80.0–100.0)
Monocytes Absolute: 0.6 10*3/uL (ref 0.1–1.0)
Monocytes Relative: 13 %
Neutro Abs: 2.7 10*3/uL (ref 1.7–7.7)
Neutrophils Relative %: 64 %
Platelet Count: 206 10*3/uL (ref 150–400)
RBC: 4.15 MIL/uL (ref 3.87–5.11)
RDW: 19.9 % — ABNORMAL HIGH (ref 11.5–15.5)
WBC Count: 4.2 10*3/uL (ref 4.0–10.5)
nRBC: 0 % (ref 0.0–0.2)

## 2023-10-25 NOTE — Progress Notes (Signed)
Patient Care Team: Karie Schwalbe, MD as PCP - General (Internal Medicine) Gladys Damme as Consulting Physician Hildred Laser, MD as Referring Physician (Obstetrics and Gynecology) Serena Croissant, MD as Consulting Physician (Hematology and Oncology) Lonie Peak, MD as Attending Physician (Radiation Oncology) [name], MD as Consulting Physician (General Surgery) Donnelly Angelica, RN as Oncology Nurse Navigator Pershing Proud, RN as Oncology Nurse Navigator  DIAGNOSIS:  Encounter Diagnosis  Name Primary?   Malignant neoplasm of upper-outer quadrant of right breast in female, estrogen receptor positive (HCC) Yes    SUMMARY OF ONCOLOGIC HISTORY: Oncology History  Malignant neoplasm of upper-outer quadrant of right breast in female, estrogen receptor positive (HCC)  03/04/2023 Initial Diagnosis   Palpable right breast lump UOQ 12 o'clock position: 1.7 cm by ultrasound, 3 abnormal lymph nodes: 1 biopsy positive with extranodal extension.  Biopsy of the lump: Grade 2 IDC with medullary features ER 60% weak, PR 0%, Ki-67 80%, HER2 negative   03/16/2023 Cancer Staging   Staging form: Breast, AJCC 8th Edition - Clinical: Stage IIA (cT1c, cN1, cM0, G2, ER+, PR-, HER2-) - Signed by Serena Croissant, MD on 03/16/2023 Stage prefix: Initial diagnosis Histologic grading system: 3 grade system   03/24/2023 Genetic Testing   Negative [facility] Common Hereditary Cancers +RNA Panel.  Report date is 03/24/2023.    The [facility] Common Hereditary Cancers + RNA Panel includes sequencing, deletion/duplication, and RNA analysis of the following 48 genes: APC, ATM, AXIN2, BAP1, BARD1, BMPR1A, BRCA1, BRCA2, BRIP1, CDH1, CDK4*, CDKN2A*, CHEK2, CTNNA1, DICER1, EPCAM* (del/dup only), FH, GREM1* (promoter dup analysis only), HOXB13*, KIT*, MBD4*, MEN1, MLH1, MSH2, MSH3, MSH6, MUTYH, NF1, NTHL1, PALB2, PDGFRA*, PMS2, POLD1, POLE, PTEN, RAD51C, RAD51D, SDHA (sequencing only), SDHB, SDHC, SDHD, SMAD4,  SMARCA4, STK11, TP53, TSC1, TSC2, VHL.  *Genes without RNA analysis.     03/25/2023 - 05/26/2023 Chemotherapy   Patient is on Treatment Plan : BREAST TC q21d x 4 cycles     07/05/2023 Surgery   Right lumpectomy: Grade 2 IDC 1.5 cm, ER 60%, PR 0%, HER2 0, Ki67 80%, 1/9 lymph nodes positive (repeat prognostic panel: ER 0%, PR 0%, Ki67 30%, HER2 1+)    08/02/2023 Cancer Staging   Staging form: Breast, AJCC 8th Edition - Pathologic stage from 08/02/2023: ypT1c, pN1a, cM0, G2, ER-, PR-, HER2- - Signed by Lonie Peak, MD on 08/22/2023 Stage prefix: Post-therapy Response to neoadjuvant therapy: Partial response Method of lymph node assessment: Axillary lymph node dissection Multigene prognostic tests performed: Other Histologic grading system: 3 grade system     CHIEF COMPLIANT: Follow-up on Xeloda  HISTORY OF PRESENT ILLNESS:  History of Present Illness   Heidi Mitchell is a 73 year old female with a history of cancer who presents with diarrhea and nausea related to her medication.  She experiences diarrhea and nausea, which she attributes to her current medication regimen. The diarrhea occurs three to four times daily over the past three weeks, with severe stomach pains preceding the episodes. She began taking Imodium yesterday, which has helped reduce the frequency of diarrhea. Her medication schedule involves taking two pills in the morning and two at night for two weeks, followed by a week off. Despite being off the medication last week, she still experienced diarrhea, though less frequently, about once or twice a day after eating. The diarrhea is watery and primarily occurs after meals, but not during the night. She also reports mild nausea today and has not had diarrhea today after taking Imodium  yesterday. She took two Imodium initially and one more later, which has helped control the symptoms.  She has a history of cancer and is currently on medication to address any residual cancer  cells post-surgery. She is concerned about the potential for colon cancer, as having breast cancer increases her risk. She is hesitant to undergo a colonoscopy at this time.  She mentions having persistent headaches, particularly around her eyes, which she has experienced for years. The headaches occur daily and are described as severe. She suspects her glasses might be contributing to the pain. She has taken Tylenol for the headaches, which provided some relief.         ALLERGIES:  is allergic to sertraline hcl and excedrin tension headache [acetaminophen-caffeine].  MEDICATIONS:  Current Outpatient Medications  Medication Sig Dispense Refill   capecitabine (XELODA) 500 MG tablet Take 2 tablets (1,000 mg total) by mouth 2 (two) times daily after a meal. Take twice daily for 14 days and off for 7 days 56 tablet 7   omeprazole (PRILOSEC) 20 MG capsule Take 1 capsule (20 mg total) by mouth every other day. On an empty stomach 45 capsule 1   No current facility-administered medications for this visit.    PHYSICAL EXAMINATION: ECOG PERFORMANCE STATUS: 1 - Symptomatic but completely ambulatory  Vitals:   10/25/23 1034  BP: (!) 125/38  Pulse: 60  Resp: 17  Temp: 97.9 F (36.6 C)  SpO2: 98%   Filed Weights   10/25/23 1034  Weight: 116 lb 14.4 oz (53 kg)    Physical Exam          (exam performed in the presence of a chaperone)  LABORATORY DATA:  I have reviewed the data as listed    Latest Ref Rng & Units 10/25/2023    9:56 AM 09/28/2023   11:29 AM 05/26/2023   11:52 AM  CMP  Glucose 70 - 99 mg/dL 956  92  90   BUN 8 - 23 mg/dL 15  15  14    Creatinine 0.44 - 1.00 mg/dL 2.13  0.86  5.78   Sodium 135 - 145 mmol/L 143  140  139   Potassium 3.5 - 5.1 mmol/L 3.5  4.2  3.7   Chloride 98 - 111 mmol/L 108  106  108   CO2 22 - 32 mmol/L 28  29  26    Calcium 8.9 - 10.3 mg/dL 9.2  9.4  9.0   Total Protein 6.5 - 8.1 g/dL 6.1  6.6  6.1   Total Bilirubin 0.0 - 1.2 mg/dL 2.1  1.1  0.8    Alkaline Phos 38 - 126 U/L 64  61  55   AST 15 - 41 U/L 17  20  15    ALT 0 - 44 U/L 8  10  10      Lab Results  Component Value Date   WBC 4.2 10/25/2023   HGB 12.2 10/25/2023   HCT 38.4 10/25/2023   MCV 92.5 10/25/2023   PLT 206 10/25/2023   NEUTROABS 2.7 10/25/2023    ASSESSMENT & PLAN:  Malignant neoplasm of upper-outer quadrant of right breast in female, estrogen receptor positive (HCC) 03/04/2023:Palpable right breast lump UOQ 12 o'clock position: 1.7 cm by ultrasound, 3 abnormal lymph nodes: 1 biopsy positive with extranodal extension.  Biopsy of the lump: Grade 2 IDC with medullary features ER 60% weak, PR 0%, Ki-67 80%, HER2 negative  03/24/2023: Breast MRI: Biopsy-proven malignancy 1.8 cm, non-mass enhancement measuring 3 cm, biopsy-proven axillary  lymph node.  MRI guided biopsy is being planned for 04/01/2023   Treatment plan: Neoadjuvant chemotherapy with Taxotere and Cytoxan every 3 weeks x 4 completed 05/26/2023 Breast conserving surgery with targeted node dissection: 07/05/2023: Grade 2 IDC 1.5 cm, ER 60%, PR 0%, HER2 0, Ki67 80%, 1/9 lymph nodes positive (repeat prognostic panel: ER 0%, PR 0%, Ki67 30%, HER2 1+) Adjuvant radiation 08/17/2023-09/29/2023 No role of adjuvant antiestrogen therapy since she is ER negative Adjuvant Xeloda started 10/04/2023 (planned for 6 months) --------------------------------------------------------------------------------------------- Xeloda toxicities: Diarrhea: Improved with Imodium.  She worries about constipation therefore she did not take it for the first 2 weeks. Fatigue Nausea Breast cancer surveillance: We will also add guardant reveal for MRD testing in addition to annual mammograms. Continue with the same dosage of Xeloda and see her back in 1 month to discuss any adverse effects.    Orders Placed This Encounter  Procedures   CBC with Differential (Cancer Center Only)    Standing Status:   Future    Expiration Date:    10/24/2024   CMP (Cancer Center only)    Standing Status:   Future    Expiration Date:   10/24/2024   The patient has a good understanding of the overall plan. she agrees with it. she will call with any problems that may develop before the next visit here. Total time spent: 30 mins including face to face time and time spent for planning, charting and co-ordination of care   Tamsen Meek, MD 10/25/23

## 2023-10-25 NOTE — Assessment & Plan Note (Addendum)
03/04/2023:Palpable right breast lump UOQ 12 o'clock position: 1.7 cm by ultrasound, 3 abnormal lymph nodes: 1 biopsy positive with extranodal extension.  Biopsy of the lump: Grade 2 IDC with medullary features ER 60% weak, PR 0%, Ki-67 80%, HER2 negative  03/24/2023: Breast MRI: Biopsy-proven malignancy 1.8 cm, non-mass enhancement measuring 3 cm, biopsy-proven axillary lymph node.  MRI guided biopsy is being planned for 04/01/2023   Treatment plan: Neoadjuvant chemotherapy with Taxotere and Cytoxan every 3 weeks x 4 completed 05/26/2023 Breast conserving surgery with targeted node dissection: 07/05/2023: Grade 2 IDC 1.5 cm, ER 60%, PR 0%, HER2 0, Ki67 80%, 1/9 lymph nodes positive (repeat prognostic panel: ER 0%, PR 0%, Ki67 30%, HER2 1+) Adjuvant radiation 08/17/2023-09/29/2023 No role of adjuvant antiestrogen therapy since she is ER negative Adjuvant Xeloda started 10/04/2023 (planned for 6 months) --------------------------------------------------------------------------------------------- Xeloda toxicities:  Breast cancer surveillance: We will also add guardant reveal for MRD testing in addition to annual mammograms.

## 2023-10-26 ENCOUNTER — Other Ambulatory Visit: Payer: Self-pay | Admitting: *Deleted

## 2023-10-26 ENCOUNTER — Other Ambulatory Visit: Payer: Self-pay

## 2023-10-26 ENCOUNTER — Telehealth: Payer: Self-pay | Admitting: Hematology and Oncology

## 2023-10-26 DIAGNOSIS — Z170 Estrogen receptor positive status [ER+]: Secondary | ICD-10-CM

## 2023-10-26 NOTE — Progress Notes (Signed)
Received call from pt stating her PCP Dr. Alphonsus Sias would like to add a lipid panel to next lab draw.  RN reviewed with MD and verbal orders received and placed.

## 2023-10-26 NOTE — Telephone Encounter (Signed)
Rescheduled appointment to earlier in the day. Patient was left a voicemail with changes made and will also be mailed an appointment reminder.

## 2023-10-28 ENCOUNTER — Inpatient Hospital Stay: Payer: Medicare PPO | Admitting: Physician Assistant

## 2023-10-28 ENCOUNTER — Telehealth: Payer: Self-pay | Admitting: *Deleted

## 2023-10-28 ENCOUNTER — Other Ambulatory Visit: Payer: Self-pay

## 2023-10-28 VITALS — BP 122/51 | HR 56 | Temp 97.4°F | Resp 16 | Wt 116.1 lb

## 2023-10-28 DIAGNOSIS — K219 Gastro-esophageal reflux disease without esophagitis: Secondary | ICD-10-CM | POA: Diagnosis not present

## 2023-10-28 DIAGNOSIS — H1013 Acute atopic conjunctivitis, bilateral: Secondary | ICD-10-CM

## 2023-10-28 DIAGNOSIS — R110 Nausea: Secondary | ICD-10-CM | POA: Diagnosis not present

## 2023-10-28 DIAGNOSIS — R197 Diarrhea, unspecified: Secondary | ICD-10-CM | POA: Diagnosis not present

## 2023-10-28 DIAGNOSIS — Z170 Estrogen receptor positive status [ER+]: Secondary | ICD-10-CM | POA: Diagnosis not present

## 2023-10-28 DIAGNOSIS — C50411 Malignant neoplasm of upper-outer quadrant of right female breast: Secondary | ICD-10-CM

## 2023-10-28 DIAGNOSIS — E785 Hyperlipidemia, unspecified: Secondary | ICD-10-CM | POA: Diagnosis not present

## 2023-10-28 DIAGNOSIS — R21 Rash and other nonspecific skin eruption: Secondary | ICD-10-CM | POA: Diagnosis not present

## 2023-10-28 DIAGNOSIS — Z79899 Other long term (current) drug therapy: Secondary | ICD-10-CM | POA: Diagnosis not present

## 2023-10-28 NOTE — Progress Notes (Signed)
Symptom Management Consult Note [facility]    Patient Care Team: Karie Schwalbe, MD as PCP - General (Internal Medicine) Gladys Damme as Consulting Physician Hildred Laser, MD as Referring Physician (Obstetrics and Gynecology) Serena Croissant, MD as Consulting Physician (Hematology and Oncology) Lonie Peak, MD as Attending Physician (Radiation Oncology) [name], MD as Consulting Physician (General Surgery) Donnelly Angelica, RN as Oncology Nurse Navigator Pershing Proud, RN as Oncology Nurse Navigator    Name / MRN / DOB: Heidi Mitchell  [id number]  1951/06/28   Date of visit: 10/28/2023   Chief Complaint/Reason for visit: eye pain  Current Therapy: Xeloda   ASSESSMENT & PLAN: Patient is a 73 y.o. female with oncologic history of malignant neoplasm of upper-outer quadrant of right breast in female, estrogen receptor positive followed by Dr. Pamelia Hoit.  I have viewed most recent oncology note and lab work.    #Malignant neoplasm of upper-outer quadrant of right breast in female, estrogen receptor positive - Started Xeloda x 4 weeks ago - Next appointment with oncologist is 11/22/23   #Allergic conjunctivitis  -Bilateral eye discomfort, stinging, and watering for approximately one week. No fever or purulent discharge -Recommend OTC Pataday eye drops for symptomatic relief. -Advise to avoid wearing makeup until symptoms resolve and to change pillowcase and towels frequently. -Encouraged patient to schedule appointment with eye doctor for a thorough eye exam ASAP. -Continue Xeloda as prescribed unless symptoms worsen, in which case patient should contact the office.   Strict ED precautions discussed should symptoms worsen.   Heme/Onc History: Oncology History  Malignant neoplasm of upper-outer quadrant of right breast in female, estrogen receptor positive (HCC)  03/04/2023 Initial Diagnosis   Palpable right breast lump UOQ 12 o'clock  position: 1.7 cm by ultrasound, 3 abnormal lymph nodes: 1 biopsy positive with extranodal extension.  Biopsy of the lump: Grade 2 IDC with medullary features ER 60% weak, PR 0%, Ki-67 80%, HER2 negative   03/16/2023 Cancer Staging   Staging form: Breast, AJCC 8th Edition - Clinical: Stage IIA (cT1c, cN1, cM0, G2, ER+, PR-, HER2-) - Signed by Serena Croissant, MD on 03/16/2023 Stage prefix: Initial diagnosis Histologic grading system: 3 grade system   03/24/2023 Genetic Testing   Negative [facility] Common Hereditary Cancers +RNA Panel.  Report date is 03/24/2023.    The [facility] Common Hereditary Cancers + RNA Panel includes sequencing, deletion/duplication, and RNA analysis of the following 48 genes: APC, ATM, AXIN2, BAP1, BARD1, BMPR1A, BRCA1, BRCA2, BRIP1, CDH1, CDK4*, CDKN2A*, CHEK2, CTNNA1, DICER1, EPCAM* (del/dup only), FH, GREM1* (promoter dup analysis only), HOXB13*, KIT*, MBD4*, MEN1, MLH1, MSH2, MSH3, MSH6, MUTYH, NF1, NTHL1, PALB2, PDGFRA*, PMS2, POLD1, POLE, PTEN, RAD51C, RAD51D, SDHA (sequencing only), SDHB, SDHC, SDHD, SMAD4, SMARCA4, STK11, TP53, TSC1, TSC2, VHL.  *Genes without RNA analysis.     03/25/2023 - 05/26/2023 Chemotherapy   Patient is on Treatment Plan : BREAST TC q21d x 4 cycles     07/05/2023 Surgery   Right lumpectomy: Grade 2 IDC 1.5 cm, ER 60%, PR 0%, HER2 0, Ki67 80%, 1/9 lymph nodes positive (repeat prognostic panel: ER 0%, PR 0%, Ki67 30%, HER2 1+)    08/02/2023 Cancer Staging   Staging form: Breast, AJCC 8th Edition - Pathologic stage from 08/02/2023: ypT1c, pN1a, cM0, G2, ER-, PR-, HER2- - Signed by Lonie Peak, MD on 08/22/2023 Stage prefix: Post-therapy Response to neoadjuvant therapy: Partial response Method of lymph node assessment: Axillary lymph node dissection Multigene prognostic tests performed: Other  Histologic grading system: 3 grade system       Interval history-: Discussed the use of AI scribe software for clinical note transcription with the  patient, who gave verbal consent to proceed.   Heidi Mitchell is a 73 y.o. female with oncologic history as above presenting to [facility] today with chief complaint of eye pain.  Patient is accompanied by fher spouse who provides additional history.  She has been experiencing eye pain and irritation characterized by stinging, a sensation of a foreign body, and excessive tearing. These symptoms began about a week ago and have been constant. She describes the sensation as similar to having a piece of sand in her eyes. No fever or recent cold symptoms, although there is a slight runny nose. She denies any recent eye injuries or trauma and has not been exposed to irritants such as wood or melt work. She wears glasses and does not use contact lenses. She has mild light sensitivity. Denies headache or visual changes. She is currently on Xeloda for cancer treatment, in her fourth week of the cycle, with two weeks on and one week off. The eye symptoms began during her off week.  She has a history of occasional seasonal allergies, particularly in the spring, but does not consider them severe. She has had conjunctivitis in the past and this feels similar. She does have an eye doctor.  ROS  All other systems are reviewed and are negative for acute change except as noted in the HPI.    Allergies  Allergen Reactions   Sertraline Hcl     Muscle spasms   Excedrin Tension Headache [Acetaminophen-Caffeine] Anxiety     Past Medical History:  Diagnosis Date   GERD (gastroesophageal reflux disease)    Hyperlipidemia    Intracervical pessary    Unspecified disorder of bladder    Uterine prolapse      Past Surgical History:  Procedure Laterality Date   APPENDECTOMY  1990   bartholins cyst  1978   BREAST BIOPSY Left    benign   BREAST BIOPSY Right 03/04/2023   [name] RT BREAST BX W LOC DEV 1ST LESION IMG BX SPEC US GUIDE 03/04/2023 [facility]   BREAST BIOPSY  07/04/2023   [name] RT RADIOACTIVE SEED LOC  07/04/2023 [facility]   BREAST BIOPSY  07/04/2023   MM RT RADIOACTIVE SEED LOC MAMMO GUIDE 07/04/2023 [facility]   BREAST LUMPECTOMY WITH RADIOACTIVE SEED AND AXILLARY LYMPH NODE DISSECTION Right 07/05/2023   Procedure: RADIOACTIVE SEED GUIDED RIGHT BREAST LUMPECTOMY AND TARGETED RIGHT AXILLARY LYMPH NODE DISSECTION;  Surgeon: [name], MD;  Location: [facility];  Service: General;  Laterality: Right;  LMA PEC BLOCK   COLONOSCOPY WITH PROPOFOL N/A 09/27/2018   Procedure: COLONOSCOPY WITH PROPOFOL;  Surgeon: Wyline Mood, MD;  Location: [facility];  Service: Gastroenterology;  Laterality: N/A;   IR IMAGING GUIDED PORT INSERTION  03/24/2023   PORT-A-CATH REMOVAL Left 07/05/2023   Procedure: REMOVAL PORT-A-CATH;  Surgeon: [name], MD;  Location: [facility];  Service: General;  Laterality: Left;    Social History   Socioeconomic History   Marital status: Married    Spouse name: Mitzie Na   Number of children: 1   Years of education: high school   Highest education level: Not on file  Occupational History   Not on file  Tobacco Use   Smoking status: Never    Passive exposure: Current   Smokeless tobacco: Never  Vaping Use  Vaping status: Never Used  Substance and Sexual Activity   Alcohol use: No   Drug use: No   Sexual activity: Not Currently    Birth control/protection: Abstinence  Other Topics Concern   Not on file  Social History Narrative   Does not have living will   Husband and son aware of wishes.   Pt would desires CPR but would not want prolonged life support if futile.   03/06/19   From: the area   Living: husband, Mitzie Na   Work: retired, but finances OK      Family: Son, Marcial Pacas who lives nearby, 2 grandchildren (72 and 72 yo), and a Physicist, medical on the way                 Social Drivers of Health   Financial Resource Strain: Low Risk  (08/31/2022)   Overall Financial Resource Strain  (CARDIA)    Difficulty of Paying Living Expenses: Not hard at all  Food Insecurity: No Food Insecurity (08/02/2023)   Hunger Vital Sign    Worried About Running Out of Food in the Last Year: Never true    Ran Out of Food in the Last Year: Never true  Transportation Needs: No Transportation Needs (08/02/2023)   PRAPARE - Administrator, Civil Service (Medical): No    Lack of Transportation (Non-Medical): No  Physical Activity: Insufficiently Active (08/31/2022)   Exercise Vital Sign    Days of Exercise per Week: 7 days    Minutes of Exercise per Session: 20 min  Stress: No Stress Concern Present (08/31/2022)   [employer] of Occupational Health - Occupational Stress Questionnaire    Feeling of Stress : Not at all  Social Connections: Moderately Integrated (08/27/2021)   Social Connection and Isolation Panel [NHANES]    Frequency of Communication with Friends and Family: More than three times a week    Frequency of Social Gatherings with Friends and Family: More than three times a week    Attends Religious Services: 1 to 4 times per year    Active Member of [employer] or Organizations: No    Attends Banker Meetings: Never    Marital Status: Married  Catering manager Violence: Not At Risk (08/02/2023)   Humiliation, Afraid, Rape, and Kick questionnaire    Fear of Current or Ex-Partner: No    Emotionally Abused: No    Physically Abused: No    Sexually Abused: No    Family History  Problem Relation Age of Onset   Alcohol abuse Mother    Cervical cancer Sister 20   Breast cancer Maternal Aunt        dx 76s   Other Son        hole in his brain due to been dropped when he was born     Current Outpatient Medications:    capecitabine (XELODA) 500 MG tablet, Take 2 tablets (1,000 mg total) by mouth 2 (two) times daily after a meal. Take twice daily for 14 days and off for 7 days, Disp: 56 tablet, Rfl: 7   omeprazole (PRILOSEC) 20 MG capsule, Take 1 capsule  (20 mg total) by mouth every other day. On an empty stomach, Disp: 45 capsule, Rfl: 1  PHYSICAL EXAM: ECOG FS:1 - Symptomatic but completely ambulatory    Vitals:   10/28/23 1301  BP: (!) 122/51  Pulse: (!) 56  Resp: 16  Temp: (!) 97.4 F (36.3 C)  TempSrc: Oral  SpO2: 100%  Weight: 116  lb 1.6 oz (52.7 kg)   Physical Exam Vitals and nursing note reviewed.  Constitutional:      Appearance: She is not ill-appearing or toxic-appearing.  HENT:     Head: Normocephalic.  Eyes:     General: Lids are normal. Lids are everted, no foreign bodies appreciated. Vision grossly intact. Gaze aligned appropriately. Allergic shiner present. No visual field deficit.    Extraocular Movements: Extraocular movements intact.     Conjunctiva/sclera:     Right eye: Right conjunctiva is injected.     Left eye: Left conjunctiva is injected.     Comments: No pain with EOMs  Pulmonary:     Effort: Pulmonary effort is normal.  Abdominal:     General: There is no distension.  Musculoskeletal:     Cervical back: Normal range of motion.  Skin:    General: Skin is warm and dry.  Neurological:     Mental Status: She is alert.        LABORATORY DATA: I have reviewed the data as listed    Latest Ref Rng & Units 10/25/2023    9:56 AM 09/28/2023   11:29 AM 05/26/2023   11:52 AM  CBC  WBC 4.0 - 10.5 K/uL 4.2  4.0  9.7   Hemoglobin 12.0 - 15.0 g/dL 16.1  09.6  9.4   Hematocrit 36.0 - 46.0 % 38.4  38.4  29.2   Platelets 150 - 400 K/uL 206  210  220         Latest Ref Rng & Units 10/25/2023    9:56 AM 09/28/2023   11:29 AM 05/26/2023   11:52 AM  CMP  Glucose 70 - 99 mg/dL 045  92  90   BUN 8 - 23 mg/dL 15  15  14    Creatinine 0.44 - 1.00 mg/dL 4.09  8.11  9.14   Sodium 135 - 145 mmol/L 143  140  139   Potassium 3.5 - 5.1 mmol/L 3.5  4.2  3.7   Chloride 98 - 111 mmol/L 108  106  108   CO2 22 - 32 mmol/L 28  29  26    Calcium 8.9 - 10.3 mg/dL 9.2  9.4  9.0   Total Protein 6.5 - 8.1 g/dL 6.1   6.6  6.1   Total Bilirubin 0.0 - 1.2 mg/dL 2.1  1.1  0.8   Alkaline Phos 38 - 126 U/L 64  61  55   AST 15 - 41 U/L 17  20  15    ALT 0 - 44 U/L 8  10  10         RADIOGRAPHIC STUDIES (from last 24 hours if applicable) I have personally reviewed the radiological images as listed and agreed with the findings in the report. No results found.      Visit Diagnosis: 1. Malignant neoplasm of upper-outer quadrant of right breast in female, estrogen receptor positive (HCC)   2. Allergic conjunctivitis of both eyes      No orders of the defined types were placed in this encounter.   All questions were answered. The patient knows to call the clinic with any problems, questions or concerns. No barriers to learning was detected.  A total of more than 20 minutes were spent on this encounter with face-to-face time and non-face-to-face time, including preparing to see the patient, ordering tests and/or medications, counseling the patient and coordination of care as outlined above.    Thank you for allowing me to participate in the  care of this patient.    Shanon Ace, PA-C Department of Hematology/Oncology [facility] at [facility] Phone: [phone]  [phone]    10/28/2023 3:55 PM

## 2023-10-28 NOTE — Telephone Encounter (Signed)
Received call from pt with complaint of bilateral eye irritation.  Pt states she she wakes up in the morning her eyes are sealed shut and experiences burning throughout the day.  MD out of office.  [facility] visit scheduled for further evaluation.  Pt verbalized understanding of appt time.

## 2023-10-28 NOTE — Patient Instructions (Addendum)
Pataday Eye drops to help your symptoms.  Continue using a cool compress to ease the discomfort. If you have crusting you can try a warm compress. Follow up with your eye doctor as soon as possible for an eye exam.   If symptoms worsen please call to [name] know. We might recommend temporarily stopping the Xeloda

## 2023-10-30 ENCOUNTER — Other Ambulatory Visit: Payer: Self-pay

## 2023-10-31 ENCOUNTER — Telehealth: Payer: Self-pay

## 2023-10-31 ENCOUNTER — Other Ambulatory Visit: Payer: Self-pay | Admitting: Physician Assistant

## 2023-10-31 MED ORDER — POLYMYXIN B-TRIMETHOPRIM 10000-0.1 UNIT/ML-% OP SOLN: 1.0000 [drp] | OPHTHALMIC | 0 refills | Status: DC

## 2023-10-31 NOTE — Progress Notes (Signed)
Please see telephone note from today for more details.

## 2023-10-31 NOTE — Telephone Encounter (Signed)
Pt called and let us know her eye irritation has worsened as now it is also in her left eye. She is using Pataday every 2 hours, as advised by her optometrist. They can not see her until next Wednesday. She reports the Pataday burns when she applies it to her right eye. Per MD, she should hold Xeloda until sx resolve. [name], [facility]  is sending in abx gtts for pt. Pt will still keep her optometrist appt and was given instruction to take her eye gtts with her to that appt. She verbalized agreement and understanding of all instructions.

## 2023-11-03 ENCOUNTER — Ambulatory Visit
Admission: RE | Admit: 2023-11-03 | Discharge: 2023-11-03 | Disposition: A | Discharge status: Home / Self Care | Payer: Medicare PPO | Source: Ambulatory Visit | Attending: Radiation Oncology | Admitting: Radiation Oncology

## 2023-11-04 ENCOUNTER — Other Ambulatory Visit: Payer: Self-pay

## 2023-11-08 DIAGNOSIS — C50911 Malignant neoplasm of unspecified site of right female breast: Secondary | ICD-10-CM | POA: Diagnosis not present

## 2023-11-10 ENCOUNTER — Telehealth: Payer: Self-pay | Admitting: *Deleted

## 2023-11-10 ENCOUNTER — Other Ambulatory Visit (HOSPITAL_COMMUNITY): Payer: Self-pay

## 2023-11-10 ENCOUNTER — Encounter: Payer: Self-pay | Admitting: Adult Health

## 2023-11-10 ENCOUNTER — Inpatient Hospital Stay (HOSPITAL_BASED_OUTPATIENT_CLINIC_OR_DEPARTMENT_OTHER): Payer: Medicare PPO | Admitting: Adult Health

## 2023-11-10 ENCOUNTER — Other Ambulatory Visit: Payer: Self-pay | Admitting: *Deleted

## 2023-11-10 ENCOUNTER — Inpatient Hospital Stay: Payer: Medicare PPO

## 2023-11-10 ENCOUNTER — Other Ambulatory Visit: Payer: Self-pay

## 2023-11-10 VITALS — BP 120/51 | HR 56 | Temp 98.4°F | Resp 18 | Ht 59.0 in | Wt 115.4 lb

## 2023-11-10 DIAGNOSIS — C50411 Malignant neoplasm of upper-outer quadrant of right female breast: Secondary | ICD-10-CM | POA: Diagnosis not present

## 2023-11-10 DIAGNOSIS — K219 Gastro-esophageal reflux disease without esophagitis: Secondary | ICD-10-CM | POA: Diagnosis not present

## 2023-11-10 DIAGNOSIS — Z170 Estrogen receptor positive status [ER+]: Secondary | ICD-10-CM

## 2023-11-10 DIAGNOSIS — R197 Diarrhea, unspecified: Secondary | ICD-10-CM | POA: Diagnosis not present

## 2023-11-10 DIAGNOSIS — R110 Nausea: Secondary | ICD-10-CM | POA: Diagnosis not present

## 2023-11-10 DIAGNOSIS — E785 Hyperlipidemia, unspecified: Secondary | ICD-10-CM | POA: Diagnosis not present

## 2023-11-10 DIAGNOSIS — H1013 Acute atopic conjunctivitis, bilateral: Secondary | ICD-10-CM | POA: Diagnosis not present

## 2023-11-10 DIAGNOSIS — R21 Rash and other nonspecific skin eruption: Secondary | ICD-10-CM | POA: Diagnosis not present

## 2023-11-10 DIAGNOSIS — Z79899 Other long term (current) drug therapy: Secondary | ICD-10-CM | POA: Diagnosis not present

## 2023-11-10 LAB — CBC WITH DIFFERENTIAL (CANCER CENTER ONLY)
Abs Immature Granulocytes: 0.04 10*3/uL (ref 0.00–0.07)
Basophils Absolute: 0.1 10*3/uL (ref 0.0–0.1)
Basophils Relative: 1 %
Eosinophils Absolute: 0.2 10*3/uL (ref 0.0–0.5)
Eosinophils Relative: 4 %
HCT: 35.5 % — ABNORMAL LOW (ref 36.0–46.0)
Hemoglobin: 11.4 g/dL — ABNORMAL LOW (ref 12.0–15.0)
Immature Granulocytes: 1 %
Lymphocytes Relative: 15 %
Lymphs Abs: 0.7 10*3/uL (ref 0.7–4.0)
MCH: 30.2 pg (ref 26.0–34.0)
MCHC: 32.1 g/dL (ref 30.0–36.0)
MCV: 93.9 fL (ref 80.0–100.0)
Monocytes Absolute: 0.9 10*3/uL (ref 0.1–1.0)
Monocytes Relative: 20 %
Neutro Abs: 2.7 10*3/uL (ref 1.7–7.7)
Neutrophils Relative %: 59 %
Platelet Count: 184 10*3/uL (ref 150–400)
RBC: 3.78 MIL/uL — ABNORMAL LOW (ref 3.87–5.11)
RDW: 19.7 % — ABNORMAL HIGH (ref 11.5–15.5)
WBC Count: 4.6 10*3/uL (ref 4.0–10.5)
nRBC: 0 % (ref 0.0–0.2)

## 2023-11-10 LAB — CMP (CANCER CENTER ONLY)
ALT: 27 U/L (ref 0–44)
AST: 27 U/L (ref 15–41)
Albumin: 3.8 g/dL (ref 3.5–5.0)
Alkaline Phosphatase: 81 U/L (ref 38–126)
Anion gap: 3 — ABNORMAL LOW (ref 5–15)
BUN: 10 mg/dL (ref 8–23)
CO2: 29 mmol/L (ref 22–32)
Calcium: 8.8 mg/dL — ABNORMAL LOW (ref 8.9–10.3)
Chloride: 109 mmol/L (ref 98–111)
Creatinine: 0.55 mg/dL (ref 0.44–1.00)
GFR, Estimated: >60 mL/min (ref >60)
Glucose, Bld: 100 mg/dL — ABNORMAL HIGH (ref 70–99)
Potassium: 3.6 mmol/L (ref 3.5–5.1)
Sodium: 141 mmol/L (ref 135–145)
Total Bilirubin: 2.4 mg/dL — ABNORMAL HIGH (ref 0.0–1.2)
Total Protein: 6 g/dL — ABNORMAL LOW (ref 6.5–8.1)

## 2023-11-10 MED ORDER — METHYLPREDNISOLONE 4 MG PO TBPK
ORAL_TABLET | ORAL | 0 refills | Status: DC
  Filled 2023-11-10 (×2): qty 21, 6d supply, fill #0

## 2023-11-10 MED ORDER — TOBRAMYCIN-DEXAMETHASONE 0.3-0.1 % OP SUSP: 2.0000 [drp] | OPHTHALMIC | 0 refills | Status: DC

## 2023-11-10 MED ORDER — TOBRAMYCIN-DEXAMETHASONE 0.3-0.1 % OP SUSP
2.0000 [drp] | OPHTHALMIC | 0 refills | Status: DC
  Filled 2023-11-10 (×2): qty 5, 10d supply, fill #0

## 2023-11-10 MED ORDER — METHYLPREDNISOLONE 4 MG PO TBPK: ORAL_TABLET | ORAL | 0 refills | Status: DC

## 2023-11-10 NOTE — Assessment & Plan Note (Signed)
 03/04/2023:Palpable right breast lump UOQ 12 o'clock position: 1.7 cm by ultrasound, 3 abnormal lymph nodes: 1 biopsy positive with extranodal extension.  Biopsy of the lump: Grade 2 IDC with medullary features ER 60% weak, PR 0%, Ki-67 80%, HER2 negative  03/24/2023: Breast MRI: Biopsy-proven malignancy 1.8 cm, non-mass enhancement measuring 3 cm, biopsy-proven axillary lymph node.  MRI guided biopsy is being planned for 04/01/2023   Treatment plan: Neoadjuvant chemotherapy with Taxotere and Cytoxan every 3 weeks x 4 completed 05/26/2023 Breast conserving surgery with targeted node dissection: 07/05/2023: Grade 2 IDC 1.5 cm, ER 60%, PR 0%, HER2 0, Ki67 80%, 1/9 lymph nodes positive (repeat prognostic panel: ER 0%, PR 0%, Ki67 30%, HER2 1+) Adjuvant radiation 08/17/2023-09/29/2023 No role of adjuvant antiestrogen therapy since she is ER negative Adjuvant Xeloda started 10/04/2023 (planned for 6 months) --------------------------------------------------------------------------------------------- Xeloda toxicities:  Breast Cancer On hold from Xeloda due to ocular and dermatologic side effects. -Do not restart Xeloda until after follow-up with Dr. Pamelia Hoit on November 22, 2023.  Ocular Irritation Reports photophobia, tearing, and a gritty sensation. No improvement with Pataday or Polytrim eye gtts. -Prescribe Tobradex to help with persistent symptoms. -Patient to stop other eye gtts  Dermatologic Rash Developed rash after using lotion with vitamin E, which has now spread. -Prescribe Medrol Dosepak (steroid) to help with rash. -Pharmacist to mail medications to patient for convenience which should arrive tomorrow  Follow-up with eye doctor on November 16, 2023. Request that eye doctor fax updates to our office.  RTC for lab, f/u with Dr. Pamelia Hoit on 11/22/2023--I reviewed with her not to restart Capecitabine until cleared by him at that time.

## 2023-11-10 NOTE — Telephone Encounter (Signed)
 Per Lillard Anes, DNP, called pt to f/u about symptoms below. Pt stated that she felt better and things have eased up. Pt asked about finishing prescribed eyedrops. Advised pt to finish. Pt verbalized understanding and will f/u as scheduled

## 2023-11-10 NOTE — Progress Notes (Signed)
 [facility] Cancer Follow up:    Heidi Schwalbe, MD [address] [place] [address]   DIAGNOSIS:  Cancer Staging  Malignant neoplasm of upper-outer quadrant of right breast in female, estrogen receptor positive (HCC) Staging form: Breast, AJCC 8th Edition - Clinical: Stage IIA (cT1c, cN1, cM0, G2, ER+, PR-, HER2-) - Signed by Serena Croissant, MD on 03/16/2023 Stage prefix: Initial diagnosis Histologic grading system: 3 grade system - Pathologic stage from 08/02/2023: ypT1c, pN1a, cM0, G2, ER-, PR-, HER2- - Signed by Lonie Peak, MD on 08/22/2023 Stage prefix: Post-therapy Response to neoadjuvant therapy: Partial response Method of lymph node assessment: Axillary lymph node dissection Multigene prognostic tests performed: Other Histologic grading system: 3 grade system   SUMMARY OF ONCOLOGIC HISTORY: Oncology History  Malignant neoplasm of upper-outer quadrant of right breast in female, estrogen receptor positive (HCC)  03/04/2023 Initial Diagnosis   Palpable right breast lump UOQ 12 o'clock position: 1.7 cm by ultrasound, 3 abnormal lymph nodes: 1 biopsy positive with extranodal extension.  Biopsy of the lump: Grade 2 IDC with medullary features ER 60% weak, PR 0%, Ki-67 80%, HER2 negative   03/16/2023 Cancer Staging   Staging form: Breast, AJCC 8th Edition - Clinical: Stage IIA (cT1c, cN1, cM0, G2, ER+, PR-, HER2-) - Signed by Serena Croissant, MD on 03/16/2023 Stage prefix: Initial diagnosis Histologic grading system: 3 grade system   03/24/2023 Genetic Testing   Negative [facility] Common Hereditary Cancers +RNA Panel.  Report date is 03/24/2023.    The [facility] Common Hereditary Cancers + RNA Panel includes sequencing, deletion/duplication, and RNA analysis of the following 48 genes: APC, ATM, AXIN2, BAP1, BARD1, BMPR1A, BRCA1, BRCA2, BRIP1, CDH1, CDK4*, CDKN2A*, CHEK2, CTNNA1, DICER1, EPCAM* (del/dup only), FH, GREM1* (promoter dup analysis only), HOXB13*, KIT*,  MBD4*, MEN1, MLH1, MSH2, MSH3, MSH6, MUTYH, NF1, NTHL1, PALB2, PDGFRA*, PMS2, POLD1, POLE, PTEN, RAD51C, RAD51D, SDHA (sequencing only), SDHB, SDHC, SDHD, SMAD4, SMARCA4, STK11, TP53, TSC1, TSC2, VHL.  *Genes without RNA analysis.     03/25/2023 - 05/26/2023 Chemotherapy   Patient is on Treatment Plan : BREAST TC q21d x 4 cycles     07/05/2023 Surgery   Right lumpectomy: Grade 2 IDC 1.5 cm, ER 60%, PR 0%, HER2 0, Ki67 80%, 1/9 lymph nodes positive (repeat prognostic panel: ER 0%, PR 0%, Ki67 30%, HER2 1+)    08/02/2023 Cancer Staging   Staging form: Breast, AJCC 8th Edition - Pathologic stage from 08/02/2023: ypT1c, pN1a, cM0, G2, ER-, PR-, HER2- - Signed by Lonie Peak, MD on 08/22/2023 Stage prefix: Post-therapy Response to neoadjuvant therapy: Partial response Method of lymph node assessment: Axillary lymph node dissection Multigene prognostic tests performed: Other Histologic grading system: 3 grade system   08/16/2023 - 09/30/2023 Radiation Therapy   Plan Name: Breast_R Site: Breast, Right Technique: 3D Mode: Photon Dose Per Fraction: 2 Gy Prescribed Dose (Delivered / Prescribed): 50 Gy / 50 Gy Prescribed Fxs (Delivered / Prescribed): 25 / 25   Plan Name: Breast_R_PAB_ Site: Breast, Right Technique: 3D Mode: Photon Dose Per Fraction: 2 Gy Prescribed Dose (Delivered / Prescribed): 50 Gy / 50 Gy Prescribed Fxs (Delivered / Prescribed): 25 / 25   Plan Name: Breast_R_Bst Site: Breast, Right Technique: 3D Mode: Photon Dose Per Fraction: 2 Gy Prescribed Dose (Delivered / Prescribed): 10 Gy / 10 Gy Prescribed Fxs (Delivered / Prescribed): 5 / 5     10/03/2023 -  Adjuvant Chemotherapy   Capecitabine BID 14 days on and 7 days off--repeated every 21  days.      CURRENT THERAPY: Capecitabine (on hold)  INTERVAL HISTORY:  Discussed the use of AI scribe software for clinical note transcription with the patient, who gave verbal consent to proceed.  Heidi Mitchell 73 y.o.  female with a history of breast cancer, presents with discomfort in the eye and a rash on the breast. The eye discomfort is described as a gritty sensation, with increased watering in sunlight or while driving at night. She also reports a foggy vision and a small dark spot in the eye. There is no significant pain, but the sensation is described as aggravating.  The patient also reports a rash on the breast, which had almost completely resolved but has now reappeared. She had been using a lotion with vitamin E for about a week and a half, which she suspects may have caused the rash. She switched back to Neosporin yesterday.  The patient has been on chemotherapy pills, which were paused due to the eye issue. She is due to restart the medication, but is waiting for a consultation with the eye doctor.   Patient Active Problem List   Diagnosis Date Noted   Preventative health care 10/10/2023   Atrophic vaginitis 10/10/2023   Port-A-Cath in place 04/15/2023   Genetic testing 03/25/2023   Malignant neoplasm of upper-outer quadrant of right breast in female, estrogen receptor positive (HCC) 03/14/2023   Benign positional vertigo 10/06/2022   Esophageal dysphagia 08/10/2021   Hyperlipidemia, unspecified 07/29/2017    is allergic to sertraline hcl and excedrin tension headache [acetaminophen-caffeine].  MEDICAL HISTORY: Past Medical History:  Diagnosis Date   GERD (gastroesophageal reflux disease)    Hyperlipidemia    Intracervical pessary    Unspecified disorder of bladder    Uterine prolapse     SURGICAL HISTORY: Past Surgical History:  Procedure Laterality Date   APPENDECTOMY  1990   bartholins cyst  1978   BREAST BIOPSY Left    benign   BREAST BIOPSY Right 03/04/2023   [name] RT BREAST BX W LOC DEV 1ST LESION IMG BX SPEC US GUIDE 03/04/2023 [facility]   BREAST BIOPSY  07/04/2023   [name] RT RADIOACTIVE SEED LOC 07/04/2023 [facility]   BREAST BIOPSY  07/04/2023   MM RT  RADIOACTIVE SEED LOC MAMMO GUIDE 07/04/2023 [facility]   BREAST LUMPECTOMY WITH RADIOACTIVE SEED AND AXILLARY LYMPH NODE DISSECTION Right 07/05/2023   Procedure: RADIOACTIVE SEED GUIDED RIGHT BREAST LUMPECTOMY AND TARGETED RIGHT AXILLARY LYMPH NODE DISSECTION;  Surgeon: [name], MD;  Location: [facility];  Service: General;  Laterality: Right;  LMA PEC BLOCK   COLONOSCOPY WITH PROPOFOL N/A 09/27/2018   Procedure: COLONOSCOPY WITH PROPOFOL;  Surgeon: Wyline Mood, MD;  Location: [facility];  Service: Gastroenterology;  Laterality: N/A;   IR IMAGING GUIDED PORT INSERTION  03/24/2023   PORT-A-CATH REMOVAL Left 07/05/2023   Procedure: REMOVAL PORT-A-CATH;  Surgeon: [name], MD;  Location: [facility];  Service: General;  Laterality: Left;    SOCIAL HISTORY: Social History   Socioeconomic History   Marital status: Married    Spouse name: Mitzie Na   Number of children: 1   Years of education: high school   Highest education level: Not on file  Occupational History   Not on file  Tobacco Use   Smoking status: Never    Passive exposure: Current   Smokeless tobacco: Never  Vaping Use   Vaping status: Never Used  Substance and Sexual Activity   Alcohol use:  No   Drug use: No   Sexual activity: Not Currently    Birth control/protection: Abstinence  Other Topics Concern   Not on file  Social History Narrative   Does not have living will   Husband and son aware of wishes.   Pt would desires CPR but would not want prolonged life support if futile.   03/06/19   From: the area   Living: husband, Mitzie Na   Work: retired, but finances OK      Family: Son, Marcial Pacas who lives nearby, 2 grandchildren (25 and 23 yo), and a Physicist, medical on the way                 Social Drivers of Health   Financial Resource Strain: Low Risk  (08/31/2022)   Overall Financial Resource Strain (CARDIA)    Difficulty of Paying Living Expenses:  Not hard at all  Food Insecurity: No Food Insecurity (08/02/2023)   Hunger Vital Sign    Worried About Running Out of Food in the Last Year: Never true    Ran Out of Food in the Last Year: Never true  Transportation Needs: No Transportation Needs (08/02/2023)   PRAPARE - Administrator, Civil Service (Medical): No    Lack of Transportation (Non-Medical): No  Physical Activity: Insufficiently Active (08/31/2022)   Exercise Vital Sign    Days of Exercise per Week: 7 days    Minutes of Exercise per Session: 20 min  Stress: No Stress Concern Present (08/31/2022)   [employer] of Occupational Health - Occupational Stress Questionnaire    Feeling of Stress : Not at all  Social Connections: Moderately Integrated (08/27/2021)   Social Connection and Isolation Panel [NHANES]    Frequency of Communication with Friends and Family: More than three times a week    Frequency of Social Gatherings with Friends and Family: More than three times a week    Attends Religious Services: 1 to 4 times per year    Active Member of [employer] or Organizations: No    Attends Banker Meetings: Never    Marital Status: Married  Catering manager Violence: Not At Risk (08/02/2023)   Humiliation, Afraid, Rape, and Kick questionnaire    Fear of Current or Ex-Partner: No    Emotionally Abused: No    Physically Abused: No    Sexually Abused: No    FAMILY HISTORY: Family History  Problem Relation Age of Onset   Alcohol abuse Mother    Cervical cancer Sister 44   Breast cancer Maternal Aunt        dx 59s   Other Son        hole in his brain due to been dropped when he was born    Review of Systems  Constitutional:  Negative for appetite change, chills, fatigue, fever and unexpected weight change.  HENT:   Negative for hearing loss, lump/mass and trouble swallowing.   Eyes:  Positive for eye problems. Negative for icterus.  Respiratory:  Negative for chest tightness, cough and  shortness of breath.   Cardiovascular:  Negative for chest pain, leg swelling and palpitations.  Gastrointestinal:  Negative for abdominal distention, abdominal pain, constipation, diarrhea, nausea and vomiting.  Endocrine: Negative for hot flashes.  Genitourinary:  Negative for difficulty urinating.   Musculoskeletal:  Negative for arthralgias.  Skin:  Positive for itching and rash.  Neurological:  Negative for dizziness, extremity weakness, headaches and numbness.  Hematological:  Negative for adenopathy. Does not bruise/bleed  easily.  Psychiatric/Behavioral:  Negative for depression. The patient is not nervous/anxious.       PHYSICAL EXAMINATION    Vitals:   11/10/23 1315 11/10/23 1318  BP: (!) 130/47 (!) 120/51  Pulse: (!) 56   Resp: 18   Temp: 98.4 F (36.9 C)   SpO2: 100%     Physical Exam Constitutional:      General: She is not in acute distress.    Appearance: Normal appearance. She is not toxic-appearing.  HENT:     Head: Normocephalic and atraumatic.     Mouth/Throat:     Mouth: Mucous membranes are moist.     Pharynx: Oropharynx is clear. No oropharyngeal exudate or posterior oropharyngeal erythema.  Eyes:     General: No scleral icterus. Cardiovascular:     Rate and Rhythm: Normal rate and regular rhythm.     Pulses: Normal pulses.     Heart sounds: Normal heart sounds.  Pulmonary:     Effort: Pulmonary effort is normal.     Breath sounds: Normal breath sounds.  Abdominal:     General: Abdomen is flat. Bowel sounds are normal. There is no distension.     Palpations: Abdomen is soft.     Tenderness: There is no abdominal tenderness.  Musculoskeletal:        General: No swelling.     Cervical back: Neck supple.  Lymphadenopathy:     Cervical: No cervical adenopathy.  Skin:    General: Skin is warm and dry.     Findings: No rash.          Comments: Diffuse erythematous maculopapular rash noted on chest and upper abdomen along with upper shoulders.     Neurological:     General: No focal deficit present.     Mental Status: She is alert.  Psychiatric:        Mood and Affect: Mood normal.        Behavior: Behavior normal.     LABORATORY DATA:  CBC    Component Value Date/Time   WBC 4.6 11/10/2023 1238   WBC 6.9 06/03/2022 0920   RBC 3.78 (L) 11/10/2023 1238   HGB 11.4 (L) 11/10/2023 1238   HCT 35.5 (L) 11/10/2023 1238   PLT 184 11/10/2023 1238   MCV 93.9 11/10/2023 1238   MCH 30.2 11/10/2023 1238   MCHC 32.1 11/10/2023 1238   RDW 19.7 (H) 11/10/2023 1238   LYMPHSABS 0.7 11/10/2023 1238   MONOABS 0.9 11/10/2023 1238   EOSABS 0.2 11/10/2023 1238   BASOSABS 0.1 11/10/2023 1238    CMP     Component Value Date/Time   NA 141 11/10/2023 1238   K 3.6 11/10/2023 1238   CL 109 11/10/2023 1238   CO2 29 11/10/2023 1238   GLUCOSE 100 (H) 11/10/2023 1238   BUN 10 11/10/2023 1238   CREATININE 0.55 11/10/2023 1238   CALCIUM 8.8 (L) 11/10/2023 1238   PROT 6.0 (L) 11/10/2023 1238   ALBUMIN 3.8 11/10/2023 1238   AST 27 11/10/2023 1238   ALT 27 11/10/2023 1238   ALKPHOS 81 11/10/2023 1238   BILITOT 2.4 (H) 11/10/2023 1238   GFRNONAA >60 11/10/2023 1238       ASSESSMENT and THERAPY PLAN:   Malignant neoplasm of upper-outer quadrant of right breast in female, estrogen receptor positive (HCC) 03/04/2023:Palpable right breast lump UOQ 12 o'clock position: 1.7 cm by ultrasound, 3 abnormal lymph nodes: 1 biopsy positive with extranodal extension.  Biopsy of the lump: Grade 2 IDC  with medullary features ER 60% weak, PR 0%, Ki-67 80%, HER2 negative  03/24/2023: Breast MRI: Biopsy-proven malignancy 1.8 cm, non-mass enhancement measuring 3 cm, biopsy-proven axillary lymph node.  MRI guided biopsy is being planned for 04/01/2023   Treatment plan: Neoadjuvant chemotherapy with Taxotere and Cytoxan every 3 weeks x 4 completed 05/26/2023 Breast conserving surgery with targeted node dissection: 07/05/2023: Grade 2 IDC 1.5 cm, ER 60%, PR 0%,  HER2 0, Ki67 80%, 1/9 lymph nodes positive (repeat prognostic panel: ER 0%, PR 0%, Ki67 30%, HER2 1+) Adjuvant radiation 08/17/2023-09/29/2023 No role of adjuvant antiestrogen therapy since she is ER negative Adjuvant Xeloda started 10/04/2023 (planned for 6 months) --------------------------------------------------------------------------------------------- Xeloda toxicities:  Breast Cancer On hold from Xeloda due to ocular and dermatologic side effects. -Do not restart Xeloda until after follow-up with Dr. Pamelia Hoit on November 22, 2023.  Ocular Irritation Reports photophobia, tearing, and a gritty sensation. No improvement with Pataday or Polytrim eye gtts. -Prescribe Tobradex to help with persistent symptoms. -Patient to stop other eye gtts  Dermatologic Rash Developed rash after using lotion with vitamin E, which has now spread. -Prescribe Medrol Dosepak (steroid) to help with rash. -Pharmacist to mail medications to patient for convenience which should arrive tomorrow  Follow-up with eye doctor on November 16, 2023. Request that eye doctor fax updates to our office.  RTC for lab, f/u with Dr. Pamelia Hoit on 11/22/2023--I reviewed with her not to restart Capecitabine until cleared by him at that time.    All questions were answered. The patient knows to call the clinic with any problems, questions or concerns. We can certainly see the patient much sooner if necessary.  Total encounter time:20 minutes*in face-to-face visit time, chart review, lab review, care coordination, order entry, and documentation of the encounter time.    Lillard Anes, NP 11/10/23 3:34 PM Medical Oncology and Hematology [facility] [address] [place], [place] [address] Tel. [phone]    Fax. [phone]  *Total Encounter Time as defined by the Centers for Medicare and Medicaid Services includes, in addition to the face-to-face time of a patient visit (documented in the note above) non-face-to-face  time: obtaining and reviewing outside history, ordering and reviewing medications, tests or procedures, care coordination (communications with other health care professionals or caregivers) and documentation in the medical record.

## 2023-11-10 NOTE — Telephone Encounter (Signed)
-----   Message from Noreene Filbert sent at 11/10/2023  3:19 PM EST ----- Please call patient and check and ses if she is having any abdominal pain or nausea? ----- Message ----- From: Leory Plowman, Lab In [place] Sent: 11/10/2023   1:05 PM EST To: Loa Socks, NP

## 2023-11-10 NOTE — Telephone Encounter (Signed)
 Received call from pt with complaint of ongoing bilateral eye irritation and drainage despite being prescribed polytrim drops and OTC Pataday drops.  Pt also holding Xeloda at this time due to the eye irritation per request of [facility].  Pt also has complaint of bilateral breast rash.  Per MD [facility] visit needing to be done for further evaluation. Appt scheduled, pt educated and verbalized understanding.

## 2023-11-11 ENCOUNTER — Other Ambulatory Visit: Payer: Self-pay

## 2023-11-11 ENCOUNTER — Other Ambulatory Visit (HOSPITAL_COMMUNITY): Payer: Self-pay

## 2023-11-16 DIAGNOSIS — H04123 Dry eye syndrome of bilateral lacrimal glands: Secondary | ICD-10-CM | POA: Diagnosis not present

## 2023-11-19 ENCOUNTER — Telehealth: Payer: Self-pay | Admitting: Adult Health

## 2023-11-19 NOTE — Telephone Encounter (Signed)
 Scheduled appointment per scheduling message. Talked with the patient and she is aware of the made appointment.

## 2023-11-20 ENCOUNTER — Other Ambulatory Visit: Payer: Self-pay

## 2023-11-22 ENCOUNTER — Telehealth: Payer: Self-pay

## 2023-11-22 ENCOUNTER — Other Ambulatory Visit: Payer: Medicare PPO

## 2023-11-22 ENCOUNTER — Telehealth: Payer: Self-pay | Admitting: Hematology and Oncology

## 2023-11-22 ENCOUNTER — Inpatient Hospital Stay: Payer: Medicare PPO | Attending: Hematology and Oncology

## 2023-11-22 ENCOUNTER — Other Ambulatory Visit (HOSPITAL_COMMUNITY): Payer: Self-pay

## 2023-11-22 ENCOUNTER — Inpatient Hospital Stay (HOSPITAL_BASED_OUTPATIENT_CLINIC_OR_DEPARTMENT_OTHER): Payer: Medicare PPO | Admitting: Hematology and Oncology

## 2023-11-22 ENCOUNTER — Other Ambulatory Visit: Payer: Self-pay | Admitting: *Deleted

## 2023-11-22 ENCOUNTER — Ambulatory Visit: Payer: Medicare PPO | Admitting: Hematology and Oncology

## 2023-11-22 VITALS — BP 126/42 | HR 55 | Temp 97.6°F | Resp 16 | Ht 59.0 in | Wt 116.0 lb

## 2023-11-22 DIAGNOSIS — R110 Nausea: Secondary | ICD-10-CM | POA: Diagnosis not present

## 2023-11-22 DIAGNOSIS — C50411 Malignant neoplasm of upper-outer quadrant of right female breast: Secondary | ICD-10-CM

## 2023-11-22 DIAGNOSIS — R197 Diarrhea, unspecified: Secondary | ICD-10-CM | POA: Diagnosis not present

## 2023-11-22 DIAGNOSIS — Z170 Estrogen receptor positive status [ER+]: Secondary | ICD-10-CM | POA: Insufficient documentation

## 2023-11-22 DIAGNOSIS — Z79899 Other long term (current) drug therapy: Secondary | ICD-10-CM | POA: Insufficient documentation

## 2023-11-22 DIAGNOSIS — R17 Unspecified jaundice: Secondary | ICD-10-CM | POA: Insufficient documentation

## 2023-11-22 DIAGNOSIS — Z923 Personal history of irradiation: Secondary | ICD-10-CM | POA: Insufficient documentation

## 2023-11-22 DIAGNOSIS — Z9221 Personal history of antineoplastic chemotherapy: Secondary | ICD-10-CM | POA: Diagnosis not present

## 2023-11-22 DIAGNOSIS — R21 Rash and other nonspecific skin eruption: Secondary | ICD-10-CM | POA: Insufficient documentation

## 2023-11-22 DIAGNOSIS — R6889 Other general symptoms and signs: Secondary | ICD-10-CM | POA: Diagnosis not present

## 2023-11-22 DIAGNOSIS — R5383 Other fatigue: Secondary | ICD-10-CM | POA: Insufficient documentation

## 2023-11-22 LAB — CMP (CANCER CENTER ONLY)
ALT: 18 U/L (ref 0–44)
AST: 22 U/L (ref 15–41)
Albumin: 3.9 g/dL (ref 3.5–5.0)
Alkaline Phosphatase: 80 U/L (ref 38–126)
Anion gap: 4 — ABNORMAL LOW (ref 5–15)
BUN: 14 mg/dL (ref 8–23)
CO2: 31 mmol/L (ref 22–32)
Calcium: 8.8 mg/dL — ABNORMAL LOW (ref 8.9–10.3)
Chloride: 103 mmol/L (ref 98–111)
Creatinine: 0.67 mg/dL (ref 0.44–1.00)
GFR, Estimated: >60 mL/min (ref >60)
Glucose, Bld: 92 mg/dL (ref 70–99)
Potassium: 4.7 mmol/L (ref 3.5–5.1)
Sodium: 138 mmol/L (ref 135–145)
Total Bilirubin: 4 mg/dL (ref 0.0–1.2)
Total Protein: 6 g/dL — ABNORMAL LOW (ref 6.5–8.1)

## 2023-11-22 LAB — LIPID PANEL
Cholesterol: 211 mg/dL — ABNORMAL HIGH (ref 0–200)
HDL: 67 mg/dL (ref >40)
LDL Cholesterol: 110 mg/dL — ABNORMAL HIGH (ref 0–99)
Total CHOL/HDL Ratio: 3.1 ratio
Triglycerides: 171 mg/dL — ABNORMAL HIGH (ref <150)
VLDL: 34 mg/dL (ref 0–40)

## 2023-11-22 LAB — CBC WITH DIFFERENTIAL (CANCER CENTER ONLY)
Abs Immature Granulocytes: 0.12 10*3/uL — ABNORMAL HIGH (ref 0.00–0.07)
Basophils Absolute: 0.1 10*3/uL (ref 0.0–0.1)
Basophils Relative: 1 %
Eosinophils Absolute: 0.2 10*3/uL (ref 0.0–0.5)
Eosinophils Relative: 3 %
HCT: 41 % (ref 36.0–46.0)
Hemoglobin: 12.9 g/dL (ref 12.0–15.0)
Immature Granulocytes: 2 %
Lymphocytes Relative: 17 %
Lymphs Abs: 1 10*3/uL (ref 0.7–4.0)
MCH: 30.3 pg (ref 26.0–34.0)
MCHC: 31.5 g/dL (ref 30.0–36.0)
MCV: 96.2 fL (ref 80.0–100.0)
Monocytes Absolute: 0.9 10*3/uL (ref 0.1–1.0)
Monocytes Relative: 15 %
Neutro Abs: 3.9 10*3/uL (ref 1.7–7.7)
Neutrophils Relative %: 62 %
Platelet Count: 171 10*3/uL (ref 150–400)
RBC: 4.26 MIL/uL (ref 3.87–5.11)
RDW: 18.9 % — ABNORMAL HIGH (ref 11.5–15.5)
WBC Count: 6.1 10*3/uL (ref 4.0–10.5)
nRBC: 0 % (ref 0.0–0.2)

## 2023-11-22 NOTE — Assessment & Plan Note (Addendum)
 03/04/2023:Palpable right breast lump UOQ 12 o'clock position: 1.7 cm by ultrasound, 3 abnormal lymph nodes: 1 biopsy positive with extranodal extension.  Biopsy of the lump: Grade 2 IDC with medullary features ER 60% weak, PR 0%, Ki-67 80%, HER2 negative  03/24/2023: Breast MRI: Biopsy-proven malignancy 1.8 cm, non-mass enhancement measuring 3 cm, biopsy-proven axillary lymph node.  MRI guided biopsy is being planned for 04/01/2023   Treatment plan: Neoadjuvant chemotherapy with Taxotere and Cytoxan every 3 weeks x 4 completed 05/26/2023 Breast conserving surgery with targeted node dissection: 07/05/2023: Grade 2 IDC 1.5 cm, ER 60%, PR 0%, HER2 0, Ki67 80%, 1/9 lymph nodes positive (repeat prognostic panel: ER 0%, PR 0%, Ki67 30%, HER2 1+) Adjuvant radiation 08/17/2023-09/29/2023 No role of adjuvant antiestrogen therapy since she is ER negative Adjuvant Xeloda started 10/04/2023 (planned for 6 months) --------------------------------------------------------------------------------------------- Xeloda toxicities: Diarrhea: Improved with Imodium.  She worries about constipation therefore she did not take it for the first 2 weeks. Fatigue Nausea Skin rash Eye symptoms: Photophobia, tearing and gritty sensation: Xeloda on hold  Breast cancer surveillance:  Guardant reveal for MRD testing.

## 2023-11-22 NOTE — Telephone Encounter (Signed)
 CRITICAL VALUE STICKER  CRITICAL VALUE: total bilirubin = 4.0  RECEIVER (on-site recipient of call): M. , RN   DATE & TIME NOTIFIED: 11/22/23 at approx 1105  MESSENGER (representative from lab):Mindi Junker    MD NOTIFIED: Dr. Pamelia Hoit  TIME OF NOTIFICATION:1126  RESPONSE:

## 2023-11-22 NOTE — Telephone Encounter (Signed)
Scheduled appointment per 3/11 los. Patient is aware of the made appointment.

## 2023-11-22 NOTE — Progress Notes (Signed)
 Disenrolled patient from [employer].

## 2023-11-22 NOTE — Progress Notes (Signed)
 Verbal orders received for pt to undergo CT Abdomen with contrast for further evaluation of increased bilirubin.  Orders placed, scheduling and PA team notified.  Verbal orders also received from MD to re-check labs in 1 week.  Orders placed and scheduling notified.

## 2023-11-22 NOTE — Progress Notes (Signed)
 Survivorship care plan visit in June  Patient Care Team: Karie Schwalbe, MD as PCP - General (Internal Medicine) Gladys Damme as Consulting Physician Hildred Laser, MD as Referring Physician (Obstetrics and Gynecology) Serena Croissant, MD as Consulting Physician (Hematology and Oncology) Lonie Peak, MD as Attending Physician (Radiation Oncology) [name], MD as Consulting Physician (General Surgery) Donnelly Angelica, RN as Oncology Nurse Navigator Pershing Proud, RN as Oncology Nurse Navigator  DIAGNOSIS:  Encounter Diagnosis  Name Primary?   Malignant neoplasm of upper-outer quadrant of right breast in female, estrogen receptor positive (HCC) Yes    SUMMARY OF ONCOLOGIC HISTORY: Oncology History  Malignant neoplasm of upper-outer quadrant of right breast in female, estrogen receptor positive (HCC)  03/04/2023 Initial Diagnosis   Palpable right breast lump UOQ 12 o'clock position: 1.7 cm by ultrasound, 3 abnormal lymph nodes: 1 biopsy positive with extranodal extension.  Biopsy of the lump: Grade 2 IDC with medullary features ER 60% weak, PR 0%, Ki-67 80%, HER2 negative   03/16/2023 Cancer Staging   Staging form: Breast, AJCC 8th Edition - Clinical: Stage IIA (cT1c, cN1, cM0, G2, ER+, PR-, HER2-) - Signed by Serena Croissant, MD on 03/16/2023 Stage prefix: Initial diagnosis Histologic grading system: 3 grade system   03/24/2023 Genetic Testing   Negative [facility] Common Hereditary Cancers +RNA Panel.  Report date is 03/24/2023.    The [facility] Common Hereditary Cancers + RNA Panel includes sequencing, deletion/duplication, and RNA analysis of the following 48 genes: APC, ATM, AXIN2, BAP1, BARD1, BMPR1A, BRCA1, BRCA2, BRIP1, CDH1, CDK4*, CDKN2A*, CHEK2, CTNNA1, DICER1, EPCAM* (del/dup only), FH, GREM1* (promoter dup analysis only), HOXB13*, KIT*, MBD4*, MEN1, MLH1, MSH2, MSH3, MSH6, MUTYH, NF1, NTHL1, PALB2, PDGFRA*, PMS2, POLD1, POLE, PTEN, RAD51C, RAD51D, SDHA (sequencing  only), SDHB, SDHC, SDHD, SMAD4, SMARCA4, STK11, TP53, TSC1, TSC2, VHL.  *Genes without RNA analysis.     03/25/2023 - 05/26/2023 Chemotherapy   Patient is on Treatment Plan : BREAST TC q21d x 4 cycles     07/05/2023 Surgery   Right lumpectomy: Grade 2 IDC 1.5 cm, ER 60%, PR 0%, HER2 0, Ki67 80%, 1/9 lymph nodes positive (repeat prognostic panel: ER 0%, PR 0%, Ki67 30%, HER2 1+)    08/02/2023 Cancer Staging   Staging form: Breast, AJCC 8th Edition - Pathologic stage from 08/02/2023: ypT1c, pN1a, cM0, G2, ER-, PR-, HER2- - Signed by Lonie Peak, MD on 08/22/2023 Stage prefix: Post-therapy Response to neoadjuvant therapy: Partial response Method of lymph node assessment: Axillary lymph node dissection Multigene prognostic tests performed: Other Histologic grading system: 3 grade system   08/16/2023 - 09/30/2023 Radiation Therapy   Plan Name: Breast_R Site: Breast, Right Technique: 3D Mode: Photon Dose Per Fraction: 2 Gy Prescribed Dose (Delivered / Prescribed): 50 Gy / 50 Gy Prescribed Fxs (Delivered / Prescribed): 25 / 25   Plan Name: Breast_R_PAB_ Site: Breast, Right Technique: 3D Mode: Photon Dose Per Fraction: 2 Gy Prescribed Dose (Delivered / Prescribed): 50 Gy / 50 Gy Prescribed Fxs (Delivered / Prescribed): 25 / 25   Plan Name: Breast_R_Bst Site: Breast, Right Technique: 3D Mode: Photon Dose Per Fraction: 2 Gy Prescribed Dose (Delivered / Prescribed): 10 Gy / 10 Gy Prescribed Fxs (Delivered / Prescribed): 5 / 5     10/03/2023 -  Adjuvant Chemotherapy   Capecitabine BID 14 days on and 7 days off--repeated every 21 days.      CHIEF COMPLIANT: Follow-up to discuss Xeloda treatment  HISTORY OF PRESENT ILLNESS:  History of Present Illness The patient,  with a history of cancer, presents with eye issues that started a couple of weeks ago. She describes her eyes as feeling gritty and sticky in the morning, with sunlight and car lights at night causing significant  discomfort. Reading also causes discomfort. These symptoms started during her off week after taking Xeloda, a chemotherapy pill, for two weeks. Since discontinuing the Xeloda a few days ago, the patient reports some improvement in her symptoms, but sunlight, reading, and lights at night still bother her. She has started using over-the-counter eye drops for dry eyes and has seen an eye doctor, who found no other issues with her eyes.  In addition to her eye issues, the patient also reports fatigue, stating that she gets tired easily after doing simple tasks like making the bed, cooking breakfast, and washing dishes. She also mentions a skin breakout, which is improving with steroids. She is unsure of what lotion to use, as using a lotion with vitamin E seemed to make it worse.     ALLERGIES:  is allergic to sertraline hcl and excedrin tension headache [acetaminophen-caffeine].  MEDICATIONS:  Current Outpatient Medications  Medication Sig Dispense Refill   capecitabine (XELODA) 500 MG tablet Take 2 tablets (1,000 mg total) by mouth 2 (two) times daily after a meal. Take twice daily for 14 days and off for 7 days 56 tablet 7   methylPREDNISolone (MEDROL DOSEPAK) 4 MG TBPK tablet Taper as directed on package 21 tablet 0   omeprazole (PRILOSEC) 20 MG capsule Take 1 capsule (20 mg total) by mouth every other day. On an empty stomach 45 capsule 1   tobramycin-dexamethasone (TOBRADEX) ophthalmic solution Place 2 drops into both eyes every 4 (four) hours while awake. 5 mL 0   No current facility-administered medications for this visit.    PHYSICAL EXAMINATION: ECOG PERFORMANCE STATUS: 1 - Symptomatic but completely ambulatory  Vitals:   11/22/23 1042  BP: (!) 126/42  Pulse: (!) 55  Resp: 16  Temp: 97.6 F (36.4 C)  SpO2: 100%   Filed Weights   11/22/23 1042  Weight: 116 lb (52.6 kg)    Physical Exam   (exam performed in the presence of a chaperone)  LABORATORY DATA:  I have reviewed  the data as listed    Latest Ref Rng & Units 11/10/2023   12:38 PM 10/25/2023    9:56 AM 09/28/2023   11:29 AM  CMP  Glucose 70 - 99 mg/dL 098  119  92   BUN 8 - 23 mg/dL 10  15  15    Creatinine 0.44 - 1.00 mg/dL 1.47  8.29  5.62   Sodium 135 - 145 mmol/L 141  143  140   Potassium 3.5 - 5.1 mmol/L 3.6  3.5  4.2   Chloride 98 - 111 mmol/L 109  108  106   CO2 22 - 32 mmol/L 29  28  29    Calcium 8.9 - 10.3 mg/dL 8.8  9.2  9.4   Total Protein 6.5 - 8.1 g/dL 6.0  6.1  6.6   Total Bilirubin 0.0 - 1.2 mg/dL 2.4  2.1  1.1   Alkaline Phos 38 - 126 U/L 81  64  61   AST 15 - 41 U/L 27  17  20    ALT 0 - 44 U/L 27  8  10      Lab Results  Component Value Date   WBC 6.1 11/22/2023   HGB 12.9 11/22/2023   HCT 41.0 11/22/2023   MCV 96.2  11/22/2023   PLT 171 11/22/2023   NEUTROABS 3.9 11/22/2023    ASSESSMENT & PLAN:  Malignant neoplasm of upper-outer quadrant of right breast in female, estrogen receptor positive (HCC) 03/04/2023:Palpable right breast lump UOQ 12 o'clock position: 1.7 cm by ultrasound, 3 abnormal lymph nodes: 1 biopsy positive with extranodal extension.  Biopsy of the lump: Grade 2 IDC with medullary features ER 60% weak, PR 0%, Ki-67 80%, HER2 negative  03/24/2023: Breast MRI: Biopsy-proven malignancy 1.8 cm, non-mass enhancement measuring 3 cm, biopsy-proven axillary lymph node.  MRI guided biopsy is being planned for 04/01/2023   Treatment plan: Neoadjuvant chemotherapy with Taxotere and Cytoxan every 3 weeks x 4 completed 05/26/2023 Breast conserving surgery with targeted node dissection: 07/05/2023: Grade 2 IDC 1.5 cm, ER 60%, PR 0%, HER2 0, Ki67 80%, 1/9 lymph nodes positive (repeat prognostic panel: ER 0%, PR 0%, Ki67 30%, HER2 1+) Adjuvant radiation 08/17/2023-09/29/2023 No role of adjuvant antiestrogen therapy since she is ER negative Adjuvant Xeloda started 10/04/2023 (discontinued February  2025) --------------------------------------------------------------------------------------------- Xeloda toxicities: Diarrhea Fatigue Nausea Skin rash: Improved with steroids Eye symptoms: Photophobia, tearing and gritty sensation: Xeloda discontinued and her symptoms are starting to get better.  Breast cancer surveillance:  Ordered mammogram to be done in July 2025 Survivorship care plan visit in June 2025 ------------------------------------- Assessment and Plan Assessment & Plan Malignant neoplasm of upper-outer quadrant of right breast, estrogen receptor positive Cancer removed, survivor status confirmed. Ongoing surveillance and follow-up care planned. - Schedule mammogram for July 17. - Arrange survivorship counseling session in June to discuss diet, exercise, supplements, skin health, bone health, and sexual health.  Adverse effects of Capecitabine (Xeloda) Ocular side effects from Capecitabine noted. Symptoms improved post-discontinuation. Discontinuation prioritized to prevent vision loss despite slight increase in recurrence risk. - Discontinue Capecitabine (Xeloda).  Fatigue Fatigue reported, expected to improve post-treatment. Further evaluation if persistent. - Monitor fatigue and address if it persists during follow-up with survivorship counselor.      No orders of the defined types were placed in this encounter.  The patient has a good understanding of the overall plan. she agrees with it. she will call with any problems that may develop before the next visit here. Total time spent: 30 mins including face to face time and time spent for planning, charting and co-ordination of care   Tamsen Meek, MD 11/22/23

## 2023-11-23 ENCOUNTER — Other Ambulatory Visit: Payer: Self-pay

## 2023-11-29 ENCOUNTER — Other Ambulatory Visit

## 2023-11-30 ENCOUNTER — Encounter: Payer: Self-pay | Admitting: *Deleted

## 2023-11-30 ENCOUNTER — Ambulatory Visit (HOSPITAL_COMMUNITY)
Admission: RE | Admit: 2023-11-30 | Discharge: 2023-11-30 | Disposition: A | Discharge status: Home / Self Care | Source: Ambulatory Visit | Attending: Hematology and Oncology | Admitting: Hematology and Oncology

## 2023-11-30 ENCOUNTER — Inpatient Hospital Stay

## 2023-11-30 DIAGNOSIS — R17 Unspecified jaundice: Secondary | ICD-10-CM | POA: Insufficient documentation

## 2023-11-30 DIAGNOSIS — R197 Diarrhea, unspecified: Secondary | ICD-10-CM | POA: Diagnosis not present

## 2023-11-30 DIAGNOSIS — R5383 Other fatigue: Secondary | ICD-10-CM | POA: Diagnosis not present

## 2023-11-30 DIAGNOSIS — C50411 Malignant neoplasm of upper-outer quadrant of right female breast: Secondary | ICD-10-CM | POA: Insufficient documentation

## 2023-11-30 DIAGNOSIS — Z170 Estrogen receptor positive status [ER+]: Secondary | ICD-10-CM | POA: Insufficient documentation

## 2023-11-30 DIAGNOSIS — R6889 Other general symptoms and signs: Secondary | ICD-10-CM | POA: Diagnosis not present

## 2023-11-30 DIAGNOSIS — N281 Cyst of kidney, acquired: Secondary | ICD-10-CM | POA: Diagnosis not present

## 2023-11-30 DIAGNOSIS — Z9221 Personal history of antineoplastic chemotherapy: Secondary | ICD-10-CM | POA: Diagnosis not present

## 2023-11-30 DIAGNOSIS — R932 Abnormal findings on diagnostic imaging of liver and biliary tract: Secondary | ICD-10-CM | POA: Diagnosis not present

## 2023-11-30 DIAGNOSIS — R110 Nausea: Secondary | ICD-10-CM | POA: Diagnosis not present

## 2023-11-30 DIAGNOSIS — R21 Rash and other nonspecific skin eruption: Secondary | ICD-10-CM | POA: Diagnosis not present

## 2023-11-30 DIAGNOSIS — Z79899 Other long term (current) drug therapy: Secondary | ICD-10-CM | POA: Diagnosis not present

## 2023-11-30 LAB — CMP (CANCER CENTER ONLY)
ALT: 27 U/L (ref 0–44)
AST: 32 U/L (ref 15–41)
Albumin: 4.1 g/dL (ref 3.5–5.0)
Alkaline Phosphatase: 72 U/L (ref 38–126)
Anion gap: 7 (ref 5–15)
BUN: 14 mg/dL (ref 8–23)
CO2: 27 mmol/L (ref 22–32)
Calcium: 9.4 mg/dL (ref 8.9–10.3)
Chloride: 106 mmol/L (ref 98–111)
Creatinine: 0.68 mg/dL (ref 0.44–1.00)
GFR, Estimated: >60 mL/min (ref >60)
Glucose, Bld: 96 mg/dL (ref 70–99)
Potassium: 4.5 mmol/L (ref 3.5–5.1)
Sodium: 140 mmol/L (ref 135–145)
Total Bilirubin: 4.2 mg/dL (ref 0.0–1.2)
Total Protein: 6.4 g/dL — ABNORMAL LOW (ref 6.5–8.1)

## 2023-11-30 LAB — CBC WITH DIFFERENTIAL (CANCER CENTER ONLY)
Abs Immature Granulocytes: 0.04 10*3/uL (ref 0.00–0.07)
Basophils Absolute: 0 10*3/uL (ref 0.0–0.1)
Basophils Relative: 1 %
Eosinophils Absolute: 0.1 10*3/uL (ref 0.0–0.5)
Eosinophils Relative: 1 %
HCT: 35.6 % — ABNORMAL LOW (ref 36.0–46.0)
Hemoglobin: 11.4 g/dL — ABNORMAL LOW (ref 12.0–15.0)
Immature Granulocytes: 1 %
Lymphocytes Relative: 23 %
Lymphs Abs: 1.7 10*3/uL (ref 0.7–4.0)
MCH: 31.8 pg (ref 26.0–34.0)
MCHC: 32 g/dL (ref 30.0–36.0)
MCV: 99.2 fL (ref 80.0–100.0)
Monocytes Absolute: 0.8 10*3/uL (ref 0.1–1.0)
Monocytes Relative: 12 %
Neutro Abs: 4.7 10*3/uL (ref 1.7–7.7)
Neutrophils Relative %: 62 %
Platelet Count: 144 10*3/uL — ABNORMAL LOW (ref 150–400)
RBC: 3.59 MIL/uL — ABNORMAL LOW (ref 3.87–5.11)
RDW: 17.6 % — ABNORMAL HIGH (ref 11.5–15.5)
WBC Count: 7.3 10*3/uL (ref 4.0–10.5)
nRBC: 0 % (ref 0.0–0.2)

## 2023-11-30 MED ORDER — IOHEXOL 300 MG/ML  SOLN
100.0000 mL | Freq: Once | INTRAMUSCULAR | Status: AC | PRN
  Administered 2023-11-30: 80 mL via INTRAVENOUS

## 2023-11-30 MED ORDER — IOHEXOL 9 MG/ML PO SOLN
500.0000 mL | ORAL | Status: AC
  Administered 2023-11-30 (×2): 500 mL via ORAL

## 2023-11-30 MED ORDER — IOHEXOL 9 MG/ML PO SOLN
ORAL | Status: AC
  Filled 2023-11-30: qty 1000

## 2023-11-30 NOTE — Progress Notes (Unsigned)
 CRITICAL VALUE STICKER  CRITICAL VALUE: Total Bili 4.2  RECEIVER (on-site recipient of call): Adelina Mings, RN  DATE & TIME NOTIFIED: 11/30/2023 at 1400  MD NOTIFIED: Serena Croissant  TIME OF NOTIFICATION: 11/30/2023 at 1411  RESPONSE:  MD notified and verbal orders to re-check in 1 week.

## 2023-12-08 ENCOUNTER — Inpatient Hospital Stay (HOSPITAL_BASED_OUTPATIENT_CLINIC_OR_DEPARTMENT_OTHER): Admitting: Hematology and Oncology

## 2023-12-08 ENCOUNTER — Inpatient Hospital Stay

## 2023-12-08 VITALS — BP 136/65 | HR 60 | Temp 97.9°F | Resp 18 | Ht 59.0 in | Wt 116.2 lb

## 2023-12-08 DIAGNOSIS — Z9221 Personal history of antineoplastic chemotherapy: Secondary | ICD-10-CM | POA: Diagnosis not present

## 2023-12-08 DIAGNOSIS — Z170 Estrogen receptor positive status [ER+]: Secondary | ICD-10-CM

## 2023-12-08 DIAGNOSIS — C50411 Malignant neoplasm of upper-outer quadrant of right female breast: Secondary | ICD-10-CM | POA: Diagnosis not present

## 2023-12-08 DIAGNOSIS — R5383 Other fatigue: Secondary | ICD-10-CM | POA: Diagnosis not present

## 2023-12-08 DIAGNOSIS — R21 Rash and other nonspecific skin eruption: Secondary | ICD-10-CM | POA: Diagnosis not present

## 2023-12-08 DIAGNOSIS — R110 Nausea: Secondary | ICD-10-CM | POA: Diagnosis not present

## 2023-12-08 DIAGNOSIS — Z79899 Other long term (current) drug therapy: Secondary | ICD-10-CM | POA: Diagnosis not present

## 2023-12-08 DIAGNOSIS — R197 Diarrhea, unspecified: Secondary | ICD-10-CM | POA: Diagnosis not present

## 2023-12-08 DIAGNOSIS — R6889 Other general symptoms and signs: Secondary | ICD-10-CM | POA: Diagnosis not present

## 2023-12-08 LAB — CBC WITH DIFFERENTIAL (CANCER CENTER ONLY)
Abs Immature Granulocytes: 0.02 10*3/uL (ref 0.00–0.07)
Basophils Absolute: 0 10*3/uL (ref 0.0–0.1)
Basophils Relative: 1 %
Eosinophils Absolute: 0.1 10*3/uL (ref 0.0–0.5)
Eosinophils Relative: 2 %
HCT: 35.6 % — ABNORMAL LOW (ref 36.0–46.0)
Hemoglobin: 11.3 g/dL — ABNORMAL LOW (ref 12.0–15.0)
Immature Granulocytes: 1 %
Lymphocytes Relative: 32 %
Lymphs Abs: 1.4 10*3/uL (ref 0.7–4.0)
MCH: 31.9 pg (ref 26.0–34.0)
MCHC: 31.7 g/dL (ref 30.0–36.0)
MCV: 100.6 fL — ABNORMAL HIGH (ref 80.0–100.0)
Monocytes Absolute: 0.6 10*3/uL (ref 0.1–1.0)
Monocytes Relative: 13 %
Neutro Abs: 2.3 10*3/uL (ref 1.7–7.7)
Neutrophils Relative %: 51 %
Platelet Count: 225 10*3/uL (ref 150–400)
RBC: 3.54 MIL/uL — ABNORMAL LOW (ref 3.87–5.11)
RDW: 15.7 % — ABNORMAL HIGH (ref 11.5–15.5)
WBC Count: 4.4 10*3/uL (ref 4.0–10.5)
nRBC: 0 % (ref 0.0–0.2)

## 2023-12-08 LAB — CMP (CANCER CENTER ONLY)
ALT: 14 U/L (ref 0–44)
AST: 20 U/L (ref 15–41)
Albumin: 4.3 g/dL (ref 3.5–5.0)
Alkaline Phosphatase: 74 U/L (ref 38–126)
Anion gap: 4 — ABNORMAL LOW (ref 5–15)
BUN: 15 mg/dL (ref 8–23)
CO2: 28 mmol/L (ref 22–32)
Calcium: 9.5 mg/dL (ref 8.9–10.3)
Chloride: 107 mmol/L (ref 98–111)
Creatinine: 0.6 mg/dL (ref 0.44–1.00)
GFR, Estimated: >60 mL/min (ref >60)
Glucose, Bld: 81 mg/dL (ref 70–99)
Potassium: 3.9 mmol/L (ref 3.5–5.1)
Sodium: 139 mmol/L (ref 135–145)
Total Bilirubin: 2.9 mg/dL — ABNORMAL HIGH (ref 0.0–1.2)
Total Protein: 6.8 g/dL (ref 6.5–8.1)

## 2023-12-08 NOTE — Progress Notes (Signed)
 Patient Care Team: Karie Schwalbe, MD as PCP - General (Internal Medicine) Gladys Damme as Consulting Physician Hildred Laser, MD as Referring Physician (Obstetrics and Gynecology) Serena Croissant, MD as Consulting Physician (Hematology and Oncology) Lonie Peak, MD as Attending Physician (Radiation Oncology) [name], MD as Consulting Physician (General Surgery) Donnelly Angelica, RN as Oncology Nurse Navigator Pershing Proud, RN as Oncology Nurse Navigator  DIAGNOSIS:  Encounter Diagnosis  Name Primary?   Malignant neoplasm of upper-outer quadrant of right breast in female, estrogen receptor positive (HCC) Yes    SUMMARY OF ONCOLOGIC HISTORY: Oncology History  Malignant neoplasm of upper-outer quadrant of right breast in female, estrogen receptor positive (HCC)  03/04/2023 Initial Diagnosis   Palpable right breast lump UOQ 12 o'clock position: 1.7 cm by ultrasound, 3 abnormal lymph nodes: 1 biopsy positive with extranodal extension.  Biopsy of the lump: Grade 2 IDC with medullary features ER 60% weak, PR 0%, Ki-67 80%, HER2 negative   03/16/2023 Cancer Staging   Staging form: Breast, AJCC 8th Edition - Clinical: Stage IIA (cT1c, cN1, cM0, G2, ER+, PR-, HER2-) - Signed by Serena Croissant, MD on 03/16/2023 Stage prefix: Initial diagnosis Histologic grading system: 3 grade system   03/24/2023 Genetic Testing   Negative [facility] Common Hereditary Cancers +RNA Panel.  Report date is 03/24/2023.    The [facility] Common Hereditary Cancers + RNA Panel includes sequencing, deletion/duplication, and RNA analysis of the following 48 genes: APC, ATM, AXIN2, BAP1, BARD1, BMPR1A, BRCA1, BRCA2, BRIP1, CDH1, CDK4*, CDKN2A*, CHEK2, CTNNA1, DICER1, EPCAM* (del/dup only), FH, GREM1* (promoter dup analysis only), HOXB13*, KIT*, MBD4*, MEN1, MLH1, MSH2, MSH3, MSH6, MUTYH, NF1, NTHL1, PALB2, PDGFRA*, PMS2, POLD1, POLE, PTEN, RAD51C, RAD51D, SDHA (sequencing only), SDHB, SDHC, SDHD, SMAD4,  SMARCA4, STK11, TP53, TSC1, TSC2, VHL.  *Genes without RNA analysis.     03/25/2023 - 05/26/2023 Chemotherapy   Patient is on Treatment Plan : BREAST TC q21d x 4 cycles     07/05/2023 Surgery   Right lumpectomy: Grade 2 IDC 1.5 cm, ER 60%, PR 0%, HER2 0, Ki67 80%, 1/9 lymph nodes positive (repeat prognostic panel: ER 0%, PR 0%, Ki67 30%, HER2 1+)    08/02/2023 Cancer Staging   Staging form: Breast, AJCC 8th Edition - Pathologic stage from 08/02/2023: ypT1c, pN1a, cM0, G2, ER-, PR-, HER2- - Signed by Lonie Peak, MD on 08/22/2023 Stage prefix: Post-therapy Response to neoadjuvant therapy: Partial response Method of lymph node assessment: Axillary lymph node dissection Multigene prognostic tests performed: Other Histologic grading system: 3 grade system   08/16/2023 - 09/30/2023 Radiation Therapy   Plan Name: Breast_R Site: Breast, Right Technique: 3D Mode: Photon Dose Per Fraction: 2 Gy Prescribed Dose (Delivered / Prescribed): 50 Gy / 50 Gy Prescribed Fxs (Delivered / Prescribed): 25 / 25   Plan Name: Breast_R_PAB_ Site: Breast, Right Technique: 3D Mode: Photon Dose Per Fraction: 2 Gy Prescribed Dose (Delivered / Prescribed): 50 Gy / 50 Gy Prescribed Fxs (Delivered / Prescribed): 25 / 25   Plan Name: Breast_R_Bst Site: Breast, Right Technique: 3D Mode: Photon Dose Per Fraction: 2 Gy Prescribed Dose (Delivered / Prescribed): 10 Gy / 10 Gy Prescribed Fxs (Delivered / Prescribed): 5 / 5     10/03/2023 -  Adjuvant Chemotherapy   Capecitabine BID 14 days on and 7 days off--repeated every 21 days.      CHIEF COMPLIANT: Follow-up after discontinuation of Xeloda  HISTORY OF PRESENT ILLNESS:   History of Present Illness The patient, with a history of cancer,  presents for a follow-up after discontinuing Xeloda due to side effects. She reports feeling significantly better since stopping the medication, with most of her eye symptoms resolved. She denies any current pain. She has  a touch of anemia and slightly elevated bilirubin levels, which have been improving since discontinuing Xeloda. She also has slightly elevated cholesterol and triglycerides, but these have improved from a year ago. The patient has multiple small cysts in the liver, which are not concerning for malignancy.     ALLERGIES:  is allergic to sertraline hcl and excedrin tension headache [acetaminophen-caffeine].  MEDICATIONS:  Current Outpatient Medications  Medication Sig Dispense Refill   omeprazole (PRILOSEC) 20 MG capsule Take 1 capsule (20 mg total) by mouth every other day. On an empty stomach 45 capsule 1   tobramycin-dexamethasone (TOBRADEX) ophthalmic solution Place 2 drops into both eyes every 4 (four) hours while awake. 5 mL 0   No current facility-administered medications for this visit.    PHYSICAL EXAMINATION: ECOG PERFORMANCE STATUS: 1 - Symptomatic but completely ambulatory  Vitals:   12/08/23 1036  BP: 136/65  Pulse: 60  Resp: 18  Temp: 97.9 F (36.6 C)  SpO2: 100%   Filed Weights   12/08/23 1036  Weight: 116 lb 3.2 oz (52.7 kg)      LABORATORY DATA:  I have reviewed the data as listed    Latest Ref Rng & Units 12/08/2023   10:00 AM 11/30/2023    1:05 PM 11/22/2023   10:10 AM  CMP  Glucose 70 - 99 mg/dL 81  96  92   BUN 8 - 23 mg/dL 15  14  14    Creatinine 0.44 - 1.00 mg/dL 0.98  1.19  1.47   Sodium 135 - 145 mmol/L 139  140  138   Potassium 3.5 - 5.1 mmol/L 3.9  4.5  4.7   Chloride 98 - 111 mmol/L 107  106  103   CO2 22 - 32 mmol/L 28  27  31    Calcium 8.9 - 10.3 mg/dL 9.5  9.4  8.8   Total Protein 6.5 - 8.1 g/dL 6.8  6.4  6.0   Total Bilirubin 0.0 - 1.2 mg/dL 2.9  4.2  4.0   Alkaline Phos 38 - 126 U/L 74  72  80   AST 15 - 41 U/L 20  32  22   ALT 0 - 44 U/L 14  27  18      Lab Results  Component Value Date   WBC 4.4 12/08/2023   HGB 11.3 (L) 12/08/2023   HCT 35.6 (L) 12/08/2023   MCV 100.6 (H) 12/08/2023   PLT 225 12/08/2023   NEUTROABS 2.3  12/08/2023    ASSESSMENT & PLAN:  Malignant neoplasm of upper-outer quadrant of right breast in female, estrogen receptor positive (HCC) 03/04/2023:Palpable right breast lump UOQ 12 o'clock position: 1.7 cm by ultrasound, 3 abnormal lymph nodes: 1 biopsy positive with extranodal extension.  Biopsy of the lump: Grade 2 IDC with medullary features ER 60% weak, PR 0%, Ki-67 80%, HER2 negative  03/24/2023: Breast MRI: Biopsy-proven malignancy 1.8 cm, non-mass enhancement measuring 3 cm, biopsy-proven axillary lymph node.  MRI guided biopsy is being planned for 04/01/2023   Treatment plan: Neoadjuvant chemotherapy with Taxotere and Cytoxan every 3 weeks x 4 completed 05/26/2023 Breast conserving surgery with targeted node dissection: 07/05/2023: Grade 2 IDC 1.5 cm, ER 60%, PR 0%, HER2 0, Ki67 80%, 1/9 lymph nodes positive (repeat prognostic panel: ER 0%, PR 0%, Ki67  30%, HER2 1+) Adjuvant radiation 08/17/2023-09/29/2023 No role of adjuvant antiestrogen therapy since she is ER negative Adjuvant Xeloda started 10/04/2023 (discontinued February 2025) --------------------------------------------------------------------------------------------- Eye symptoms: Photophobia, tearing and gritty sensation: Xeloda discontinued and her symptoms are starting to get better.   Breast cancer surveillance:  Ordered mammogram to be done in July 2025 Survivorship care plan visit in June 2025 CT CAP 11/30/2023: It has not been officially read by radiology.  To my review there appears to be cystic lesions throughout the liver but no active malignancy. ------------------------------------- Assessment and Plan Assessment & Plan Malignant neoplasm of upper-outer quadrant of right breast, estrogen receptor positive Estrogen receptor-positive breast cancer with recent CT showing multiple tiny liver cysts, not indicative of metastasis. Slight liver disturbance noted. Grapefruit should be avoided due to potential drug  interactions. - Await radiologist's report on CT scan. - Avoid grapefruit while taking medications.  Adverse effects of Capecitabine (Xeloda) Adverse effects include elevated bilirubin levels, peaking at 4.2 and decreasing to 2.9 after discontinuation. Symptomatic improvement suggests Capecitabine as the cause. - Recheck bilirubin levels in one month.  Hyperlipidemia Cholesterol levels improved, with total cholesterol reduced from 260 to 200 and LDL from 187 to 110. Triglycerides remain slightly elevated. - Continue current management of hyperlipidemia.  Follow-up Follow-up required to monitor bilirubin levels and review radiologist's report. No appointments scheduled until June, necessitating earlier follow-up. - Schedule follow-up appointment in one month for blood work and evaluation.      Orders Placed This Encounter  Procedures   CBC with Differential (Cancer Center Only)    Standing Status:   Future    Expiration Date:   12/07/2024   CMP (Cancer Center only)    Standing Status:   Future    Expiration Date:   12/07/2024   Bilirubin, direct    Standing Status:   Future    Expiration Date:   12/07/2024   The patient has a good understanding of the overall plan. she agrees with it. she will call with any problems that may develop before the next visit here. Total time spent: 30 mins including face to face time and time spent for planning, charting and co-ordination of care   Tamsen Meek, MD 12/08/23

## 2023-12-08 NOTE — Assessment & Plan Note (Signed)
 03/04/2023:Palpable right breast lump UOQ 12 o'clock position: 1.7 cm by ultrasound, 3 abnormal lymph nodes: 1 biopsy positive with extranodal extension.  Biopsy of the lump: Grade 2 IDC with medullary features ER 60% weak, PR 0%, Ki-67 80%, HER2 negative  03/24/2023: Breast MRI: Biopsy-proven malignancy 1.8 cm, non-mass enhancement measuring 3 cm, biopsy-proven axillary lymph node.  MRI guided biopsy is being planned for 04/01/2023   Treatment plan: Neoadjuvant chemotherapy with Taxotere and Cytoxan every 3 weeks x 4 completed 05/26/2023 Breast conserving surgery with targeted node dissection: 07/05/2023: Grade 2 IDC 1.5 cm, ER 60%, PR 0%, HER2 0, Ki67 80%, 1/9 lymph nodes positive (repeat prognostic panel: ER 0%, PR 0%, Ki67 30%, HER2 1+) Adjuvant radiation 08/17/2023-09/29/2023 No role of adjuvant antiestrogen therapy since she is ER negative Adjuvant Xeloda started 10/04/2023 (discontinued February 2025) --------------------------------------------------------------------------------------------- Eye symptoms: Photophobia, tearing and gritty sensation: Xeloda discontinued and her symptoms are starting to get better.   Breast cancer surveillance:  Ordered mammogram to be done in July 2025 Survivorship care plan visit in June 2025 CT CAP 11/30/2023:

## 2024-01-09 ENCOUNTER — Inpatient Hospital Stay: Attending: Hematology and Oncology

## 2024-01-09 ENCOUNTER — Ambulatory Visit (HOSPITAL_COMMUNITY)
Admission: RE | Admit: 2024-01-09 | Discharge: 2024-01-09 | Disposition: A | Discharge status: Home / Self Care | Source: Ambulatory Visit | Attending: Hematology and Oncology | Admitting: Hematology and Oncology

## 2024-01-09 ENCOUNTER — Inpatient Hospital Stay (HOSPITAL_BASED_OUTPATIENT_CLINIC_OR_DEPARTMENT_OTHER): Admitting: Hematology and Oncology

## 2024-01-09 ENCOUNTER — Telehealth: Payer: Self-pay | Admitting: Hematology and Oncology

## 2024-01-09 DIAGNOSIS — R270 Ataxia, unspecified: Secondary | ICD-10-CM

## 2024-01-09 DIAGNOSIS — N281 Cyst of kidney, acquired: Secondary | ICD-10-CM | POA: Diagnosis not present

## 2024-01-09 DIAGNOSIS — R202 Paresthesia of skin: Secondary | ICD-10-CM | POA: Diagnosis not present

## 2024-01-09 DIAGNOSIS — Z170 Estrogen receptor positive status [ER+]: Secondary | ICD-10-CM | POA: Insufficient documentation

## 2024-01-09 DIAGNOSIS — Z79899 Other long term (current) drug therapy: Secondary | ICD-10-CM | POA: Diagnosis not present

## 2024-01-09 DIAGNOSIS — N644 Mastodynia: Secondary | ICD-10-CM | POA: Insufficient documentation

## 2024-01-09 DIAGNOSIS — C50411 Malignant neoplasm of upper-outer quadrant of right female breast: Secondary | ICD-10-CM | POA: Diagnosis not present

## 2024-01-09 DIAGNOSIS — Z923 Personal history of irradiation: Secondary | ICD-10-CM | POA: Insufficient documentation

## 2024-01-09 DIAGNOSIS — Z9221 Personal history of antineoplastic chemotherapy: Secondary | ICD-10-CM | POA: Diagnosis not present

## 2024-01-09 DIAGNOSIS — Z853 Personal history of malignant neoplasm of breast: Secondary | ICD-10-CM | POA: Diagnosis not present

## 2024-01-09 DIAGNOSIS — I951 Orthostatic hypotension: Secondary | ICD-10-CM | POA: Insufficient documentation

## 2024-01-09 DIAGNOSIS — W2209XA Striking against other stationary object, initial encounter: Secondary | ICD-10-CM | POA: Insufficient documentation

## 2024-01-09 LAB — CMP (CANCER CENTER ONLY)
ALT: 9 U/L (ref 0–44)
AST: 19 U/L (ref 15–41)
Albumin: 4.3 g/dL (ref 3.5–5.0)
Alkaline Phosphatase: 72 U/L (ref 38–126)
Anion gap: 3 — ABNORMAL LOW (ref 5–15)
BUN: 13 mg/dL (ref 8–23)
CO2: 29 mmol/L (ref 22–32)
Calcium: 9.4 mg/dL (ref 8.9–10.3)
Chloride: 109 mmol/L (ref 98–111)
Creatinine: 0.58 mg/dL (ref 0.44–1.00)
GFR, Estimated: >60 mL/min (ref >60)
Glucose, Bld: 86 mg/dL (ref 70–99)
Potassium: 4.4 mmol/L (ref 3.5–5.1)
Sodium: 141 mmol/L (ref 135–145)
Total Bilirubin: 1.5 mg/dL — ABNORMAL HIGH (ref 0.0–1.2)
Total Protein: 6.7 g/dL (ref 6.5–8.1)

## 2024-01-09 LAB — CBC WITH DIFFERENTIAL (CANCER CENTER ONLY)
Abs Immature Granulocytes: 0.01 10*3/uL (ref 0.00–0.07)
Basophils Absolute: 0.1 10*3/uL (ref 0.0–0.1)
Basophils Relative: 1 %
Eosinophils Absolute: 0.1 10*3/uL (ref 0.0–0.5)
Eosinophils Relative: 3 %
HCT: 36.9 % (ref 36.0–46.0)
Hemoglobin: 11.6 g/dL — ABNORMAL LOW (ref 12.0–15.0)
Immature Granulocytes: 0 %
Lymphocytes Relative: 25 %
Lymphs Abs: 1.3 10*3/uL (ref 0.7–4.0)
MCH: 30.4 pg (ref 26.0–34.0)
MCHC: 31.4 g/dL (ref 30.0–36.0)
MCV: 96.9 fL (ref 80.0–100.0)
Monocytes Absolute: 0.6 10*3/uL (ref 0.1–1.0)
Monocytes Relative: 12 %
Neutro Abs: 3.1 10*3/uL (ref 1.7–7.7)
Neutrophils Relative %: 59 %
Platelet Count: 268 10*3/uL (ref 150–400)
RBC: 3.81 MIL/uL — ABNORMAL LOW (ref 3.87–5.11)
RDW: 13.5 % (ref 11.5–15.5)
WBC Count: 5.3 10*3/uL (ref 4.0–10.5)
nRBC: 0 % (ref 0.0–0.2)

## 2024-01-09 LAB — BILIRUBIN, DIRECT: Bilirubin, Direct: 0.2 mg/dL (ref 0.0–0.2)

## 2024-01-09 MED ORDER — GADOBUTROL 1 MMOL/ML IV SOLN
5.0000 mL | Freq: Once | INTRAVENOUS | Status: AC | PRN
  Administered 2024-01-09: 5 mL via INTRAVENOUS

## 2024-01-09 NOTE — Assessment & Plan Note (Signed)
 03/04/2023:Palpable right breast lump UOQ 12 o'clock position: 1.7 cm by ultrasound, 3 abnormal lymph nodes: 1 biopsy positive with extranodal extension.  Biopsy of the lump: Grade 2 IDC with medullary features ER 60% weak, PR 0%, Ki-67 80%, HER2 negative  03/24/2023: Breast MRI: Biopsy-proven malignancy 1.8 cm, non-mass enhancement measuring 3 cm, biopsy-proven axillary lymph node.  MRI guided biopsy is being planned for 04/01/2023   Treatment plan: Neoadjuvant chemotherapy with Taxotere  and Cytoxan  every 3 weeks x 4 completed 05/26/2023 Breast conserving surgery with targeted node dissection: 07/05/2023: Grade 2 IDC 1.5 cm, ER 60%, PR 0%, HER2 0, Ki67 80%, 1/9 lymph nodes positive (repeat prognostic panel: ER 0%, PR 0%, Ki67 30%, HER2 1+) Adjuvant radiation 08/17/2023-09/29/2023 No role of adjuvant antiestrogen therapy since she is ER negative Adjuvant Xeloda  started 10/04/2023 (discontinued February 2025) --------------------------------------------------------------------------------------------- Eye symptoms: Photophobia, tearing and gritty sensation: Xeloda  discontinued and her symptoms are starting to get better.   Breast cancer surveillance:  Ordered mammogram to be done in July 2025 Survivorship care plan visit in June 2025 CT CAP 11/30/2023: No evidence of metastatic disease.  Simple cyst left lobe of the liver 1 cm multiple cysts throughout bilateral kidneys largest 1.5 cm  Elevated bilirubin: Improved after stopping Xeloda .  Recheck LFTs

## 2024-01-09 NOTE — Progress Notes (Signed)
 Patient Care Team: Helaine Llanos, MD as PCP - General (Internal Medicine) Horatio Lynch as Consulting Physician Teresa Fender, MD as Referring Physician (Obstetrics and Gynecology) Cameron Cea, MD as Consulting Physician (Hematology and Oncology) Colie Dawes, MD as Attending Physician (Radiation Oncology) Oza Blumenthal, MD as Consulting Physician (General Surgery) Alane Hsu, RN as Oncology Nurse Navigator Auther Bo, RN as Oncology Nurse Navigator  DIAGNOSIS:  Encounter Diagnoses  Name Primary?   Malignant neoplasm of upper-outer quadrant of right breast in female, estrogen receptor positive (HCC) Yes   Ataxia     SUMMARY OF ONCOLOGIC HISTORY: Oncology History  Malignant neoplasm of upper-outer quadrant of right breast in female, estrogen receptor positive (HCC)  03/04/2023 Initial Diagnosis   Palpable right breast lump UOQ 12 o'clock position: 1.7 cm by ultrasound, 3 abnormal lymph nodes: 1 biopsy positive with extranodal extension.  Biopsy of the lump: Grade 2 IDC with medullary features ER 60% weak, PR 0%, Ki-67 80%, HER2 negative   03/16/2023 Cancer Staging   Staging form: Breast, AJCC 8th Edition - Clinical: Stage IIA (cT1c, cN1, cM0, G2, ER+, PR-, HER2-) - Signed by Cameron Cea, MD on 03/16/2023 Stage prefix: Initial diagnosis Histologic grading system: 3 grade system   03/24/2023 Genetic Testing   Negative [facility] Common Hereditary Cancers +RNA Panel.  Report date is 03/24/2023.    The [facility] Common Hereditary Cancers + RNA Panel includes sequencing, deletion/duplication, and RNA analysis of the following 48 genes: APC, ATM, AXIN2, BAP1, BARD1, BMPR1A, BRCA1, BRCA2, BRIP1, CDH1, CDK4*, CDKN2A*, CHEK2, CTNNA1, DICER1, EPCAM* (del/dup only), FH, GREM1* (promoter dup analysis only), HOXB13*, KIT*, MBD4*, MEN1, MLH1, MSH2, MSH3, MSH6, MUTYH, NF1, NTHL1, PALB2, PDGFRA*, PMS2, POLD1, POLE, PTEN, RAD51C, RAD51D, SDHA (sequencing only), SDHB, SDHC, SDHD,  SMAD4, SMARCA4, STK11, TP53, TSC1, TSC2, VHL.  *Genes without RNA analysis.     03/25/2023 - 05/26/2023 Chemotherapy   Patient is on Treatment Plan : BREAST TC q21d x 4 cycles     07/05/2023 Surgery   Right lumpectomy: Grade 2 IDC 1.5 cm, ER 60%, PR 0%, HER2 0, Ki67 80%, 1/9 lymph nodes positive (repeat prognostic panel: ER 0%, PR 0%, Ki67 30%, HER2 1+)    08/02/2023 Cancer Staging   Staging form: Breast, AJCC 8th Edition - Pathologic stage from 08/02/2023: ypT1c, pN1a, cM0, G2, ER-, PR-, HER2- - Signed by Colie Dawes, MD on 08/22/2023 Stage prefix: Post-therapy Response to neoadjuvant therapy: Partial response Method of lymph node assessment: Axillary lymph node dissection Multigene prognostic tests performed: Other Histologic grading system: 3 grade system   08/16/2023 - 09/30/2023 Radiation Therapy   Plan Name: Breast_R Site: Breast, Right Technique: 3D Mode: Photon Dose Per Fraction: 2 Gy Prescribed Dose (Delivered / Prescribed): 50 Gy / 50 Gy Prescribed Fxs (Delivered / Prescribed): 25 / 25   Plan Name: Breast_R_PAB_ Site: Breast, Right Technique: 3D Mode: Photon Dose Per Fraction: 2 Gy Prescribed Dose (Delivered / Prescribed): 50 Gy / 50 Gy Prescribed Fxs (Delivered / Prescribed): 25 / 25   Plan Name: Breast_R_Bst Site: Breast, Right Technique: 3D Mode: Photon Dose Per Fraction: 2 Gy Prescribed Dose (Delivered / Prescribed): 10 Gy / 10 Gy Prescribed Fxs (Delivered / Prescribed): 5 / 5     10/03/2023 -  Adjuvant Chemotherapy   Capecitabine  BID 14 days on and 7 days off--repeated every 21 days.      CHIEF COMPLIANT: Complains of ataxia especially to bending down and getting up  HISTORY OF PRESENT ILLNESS:  History of Present  Illness The patient presents with bilateral breast pain, which is worse with heavy lifting. She reports that the pain is more severe in the breast that was previously operated on. She also reports a sensation of heaviness and swelling in the  operated breast. She has a mammogram scheduled for June.  The patient also reports tingling in the tips of four fingers, more so on one hand than the other. She denies being on any treatments that could cause this symptom.  The patient also reports balance issues, particularly when bending down and standing back up. She reports needing to hold onto something for support when this happens. She also reports walking into door frames.  The patient has a history of cysts in the liver and kidney, which were identified on a previous CT scan. The cysts are benign and are not causing any symptoms.     ALLERGIES:  is allergic to sertraline  hcl and excedrin tension headache [acetaminophen -caffeine].  MEDICATIONS:  Current Outpatient Medications  Medication Sig Dispense Refill   omeprazole  (PRILOSEC) 20 MG capsule Take 1 capsule (20 mg total) by mouth every other day. On an empty stomach 45 capsule 1   tobramycin -dexamethasone  (TOBRADEX ) ophthalmic solution Place 2 drops into both eyes every 4 (four) hours while awake. 5 mL 0   No current facility-administered medications for this visit.    PHYSICAL EXAMINATION: ECOG PERFORMANCE STATUS: 1 - Symptomatic but completely ambulatory  There were no vitals filed for this visit. There were no vitals filed for this visit.    LABORATORY DATA:  I have reviewed the data as listed    Latest Ref Rng & Units 01/09/2024   10:15 AM 12/08/2023   10:00 AM 11/30/2023    1:05 PM  CMP  Glucose 70 - 99 mg/dL 86  81  96   BUN 8 - 23 mg/dL 13  15  14    Creatinine 0.44 - 1.00 mg/dL 2.95  6.21  3.08   Sodium 135 - 145 mmol/L 141  139  140   Potassium 3.5 - 5.1 mmol/L 4.4  3.9  4.5   Chloride 98 - 111 mmol/L 109  107  106   CO2 22 - 32 mmol/L 29  28  27    Calcium  8.9 - 10.3 mg/dL 9.4  9.5  9.4   Total Protein 6.5 - 8.1 g/dL 6.7  6.8  6.4   Total Bilirubin 0.0 - 1.2 mg/dL 1.5  2.9  4.2   Alkaline Phos 38 - 126 U/L 72  74  72   AST 15 - 41 U/L 19  20  32   ALT 0  - 44 U/L 9  14  27      Lab Results  Component Value Date   WBC 5.3 01/09/2024   HGB 11.6 (L) 01/09/2024   HCT 36.9 01/09/2024   MCV 96.9 01/09/2024   PLT 268 01/09/2024   NEUTROABS 3.1 01/09/2024    ASSESSMENT & PLAN:  Malignant neoplasm of upper-outer quadrant of right breast in female, estrogen receptor positive (HCC) 03/04/2023:Palpable right breast lump UOQ 12 o'clock position: 1.7 cm by ultrasound, 3 abnormal lymph nodes: 1 biopsy positive with extranodal extension.  Biopsy of the lump: Grade 2 IDC with medullary features ER 60% weak, PR 0%, Ki-67 80%, HER2 negative  03/24/2023: Breast MRI: Biopsy-proven malignancy 1.8 cm, non-mass enhancement measuring 3 cm, biopsy-proven axillary lymph node.  MRI guided biopsy is being planned for 04/01/2023   Treatment plan: Neoadjuvant chemotherapy with Taxotere  and Cytoxan  every 3  weeks x 4 completed 05/26/2023 Breast conserving surgery with targeted node dissection: 07/05/2023: Grade 2 IDC 1.5 cm, ER 60%, PR 0%, HER2 0, Ki67 80%, 1/9 lymph nodes positive (repeat prognostic panel: ER 0%, PR 0%, Ki67 30%, HER2 1+) Adjuvant radiation 08/17/2023-09/29/2023 No role of adjuvant antiestrogen therapy since she is ER negative Adjuvant Xeloda  started 10/04/2023 (discontinued February 2025) --------------------------------------------------------------------------------------------- Eye symptoms: Photophobia, tearing and gritty sensation: Xeloda  discontinued and her symptoms are starting to get better.   Breast cancer surveillance:  Ordered mammogram to be done in July 2025 Survivorship care plan visit in June 2025 CT CAP 11/30/2023: No evidence of metastatic disease.  Simple cyst left lobe of the liver 1 cm multiple cysts throughout bilateral kidneys largest 1.5 cm  Elevated bilirubin: Improved after stopping Xeloda .  Total bilirubin improved to 1.5 today. Ataxia: I would like to obtain a brain MRI stat  today.  ------------------------------------- Assessment and Plan Assessment & Plan Malignant neoplasm of upper-outer quadrant of right breast Soreness likely due to muscle strain. Benign liver and kidney cysts on CT. Balance issues possibly from orthostatic hypotension. MRI of the brain considered to rule out metastasis. - Order MRI of the brain to evaluate balance issues. - Encourage increased hydration for orthostatic hypotension. - Proceed with scheduled mammogram on June 18th.  Adverse effects of Capecitabine  Discontinued Capecitabine  due to elevated liver function tests and jaundice. Liver function improved post-discontinuation. - Monitor liver function tests.      Orders Placed This Encounter  Procedures   MR Brain W Wo Contrast    Standing Status:   Future    Expected Date:   01/09/2024    Expiration Date:   01/08/2025    If indicated for the ordered procedure, I authorize the administration of contrast media per Radiology protocol:   Yes    What is the patient's sedation requirement?:   No Sedation    Does the patient have a pacemaker or implanted devices?:   No    Use SRS Protocol?:   No    Preferred imaging location?:   [facility] (table limit - 550 lbs)   The patient has a good understanding of the overall plan. she agrees with it. she will call with any problems that may develop before the next visit here. Total time spent: 45 mins including face to face time and time spent for planning, charting and co-ordination of care   Margert Sheerer, MD 01/09/24

## 2024-01-09 NOTE — Telephone Encounter (Signed)
 Brain MRI: I discussed with the patient that it did not show any evidence of metastatic disease.

## 2024-01-09 NOTE — Progress Notes (Signed)
 Pt scheduled for STAT MRI brain per MD. She declines offer for w/c transport to radiology. She left [facility] ambulatory.

## 2024-01-16 ENCOUNTER — Telehealth: Payer: Self-pay

## 2024-01-16 NOTE — Telephone Encounter (Signed)
 Pt called and asked when she would f/u with MD since recent visit 4/28. Per MD note, we should continue to monitor LFTs. Pt was scheduled for lab and MD visit 02/09/24. She is agreeable.

## 2024-02-08 ENCOUNTER — Other Ambulatory Visit: Payer: Self-pay | Admitting: *Deleted

## 2024-02-08 DIAGNOSIS — Z170 Estrogen receptor positive status [ER+]: Secondary | ICD-10-CM

## 2024-02-09 ENCOUNTER — Inpatient Hospital Stay: Attending: Hematology and Oncology

## 2024-02-09 ENCOUNTER — Inpatient Hospital Stay (HOSPITAL_BASED_OUTPATIENT_CLINIC_OR_DEPARTMENT_OTHER): Admitting: Hematology and Oncology

## 2024-02-09 VITALS — BP 122/44 | HR 53 | Temp 97.7°F | Resp 12 | Ht 59.0 in | Wt 116.9 lb

## 2024-02-09 DIAGNOSIS — C50411 Malignant neoplasm of upper-outer quadrant of right female breast: Secondary | ICD-10-CM | POA: Insufficient documentation

## 2024-02-09 DIAGNOSIS — Z170 Estrogen receptor positive status [ER+]: Secondary | ICD-10-CM | POA: Diagnosis not present

## 2024-02-09 DIAGNOSIS — I890 Lymphedema, not elsewhere classified: Secondary | ICD-10-CM | POA: Insufficient documentation

## 2024-02-09 DIAGNOSIS — Z79899 Other long term (current) drug therapy: Secondary | ICD-10-CM | POA: Diagnosis not present

## 2024-02-09 DIAGNOSIS — Z9221 Personal history of antineoplastic chemotherapy: Secondary | ICD-10-CM | POA: Diagnosis not present

## 2024-02-09 DIAGNOSIS — Z923 Personal history of irradiation: Secondary | ICD-10-CM | POA: Diagnosis not present

## 2024-02-09 LAB — CMP (CANCER CENTER ONLY)
ALT: 10 U/L (ref 0–44)
AST: 19 U/L (ref 15–41)
Albumin: 4.3 g/dL (ref 3.5–5.0)
Alkaline Phosphatase: 67 U/L (ref 38–126)
Anion gap: 5 (ref 5–15)
BUN: 13 mg/dL (ref 8–23)
CO2: 29 mmol/L (ref 22–32)
Calcium: 9.5 mg/dL (ref 8.9–10.3)
Chloride: 106 mmol/L (ref 98–111)
Creatinine: 0.56 mg/dL (ref 0.44–1.00)
GFR, Estimated: >60 mL/min (ref >60)
Glucose, Bld: 85 mg/dL (ref 70–99)
Potassium: 4.4 mmol/L (ref 3.5–5.1)
Sodium: 140 mmol/L (ref 135–145)
Total Bilirubin: 1.6 mg/dL — ABNORMAL HIGH (ref 0.0–1.2)
Total Protein: 6.8 g/dL (ref 6.5–8.1)

## 2024-02-09 LAB — CBC WITH DIFFERENTIAL (CANCER CENTER ONLY)
Abs Immature Granulocytes: 0.01 10*3/uL (ref 0.00–0.07)
Basophils Absolute: 0 10*3/uL (ref 0.0–0.1)
Basophils Relative: 1 %
Eosinophils Absolute: 0.1 10*3/uL (ref 0.0–0.5)
Eosinophils Relative: 1 %
HCT: 37.8 % (ref 36.0–46.0)
Hemoglobin: 12 g/dL (ref 12.0–15.0)
Immature Granulocytes: 0 %
Lymphocytes Relative: 26 %
Lymphs Abs: 1.5 10*3/uL (ref 0.7–4.0)
MCH: 29.5 pg (ref 26.0–34.0)
MCHC: 31.7 g/dL (ref 30.0–36.0)
MCV: 92.9 fL (ref 80.0–100.0)
Monocytes Absolute: 0.6 10*3/uL (ref 0.1–1.0)
Monocytes Relative: 10 %
Neutro Abs: 3.6 10*3/uL (ref 1.7–7.7)
Neutrophils Relative %: 62 %
Platelet Count: 234 10*3/uL (ref 150–400)
RBC: 4.07 MIL/uL (ref 3.87–5.11)
RDW: 13.2 % (ref 11.5–15.5)
WBC Count: 5.8 10*3/uL (ref 4.0–10.5)
nRBC: 0 % (ref 0.0–0.2)

## 2024-02-09 NOTE — Assessment & Plan Note (Signed)
 03/04/2023:Palpable right breast lump UOQ 12 o'clock position: 1.7 cm by ultrasound, 3 abnormal lymph nodes: 1 biopsy positive with extranodal extension.  Biopsy of the lump: Grade 2 IDC with medullary features ER 60% weak, PR 0%, Ki-67 80%, HER2 negative  03/24/2023: Breast MRI: Biopsy-proven malignancy 1.8 cm, non-mass enhancement measuring 3 cm, biopsy-proven axillary lymph node.  MRI guided biopsy is being planned for 04/01/2023   Treatment plan: Neoadjuvant chemotherapy with Taxotere  and Cytoxan  every 3 weeks x 4 completed 05/26/2023 Breast conserving surgery with targeted node dissection: 07/05/2023: Grade 2 IDC 1.5 cm, ER 60%, PR 0%, HER2 0, Ki67 80%, 1/9 lymph nodes positive (repeat prognostic panel: ER 0%, PR 0%, Ki67 30%, HER2 1+) Adjuvant radiation 08/17/2023-09/29/2023 No role of adjuvant antiestrogen therapy since she is ER negative Adjuvant Xeloda  started 10/04/2023 (discontinued February 2025) --------------------------------------------------------------------------------------------- Eye symptoms: Photophobia, tearing and gritty sensation: Xeloda  discontinued and her symptoms are starting to get better.   Breast cancer surveillance:  Ordered mammogram to be done in July 2025 Survivorship care plan visit in June 2025 CT CAP 11/30/2023: No evidence of metastatic disease.  Simple cyst left lobe of the liver 1 cm multiple cysts throughout bilateral kidneys largest 1.5 cm   Elevated bilirubin: Improved after stopping Xeloda .  Total bilirubin improved to 1.5 today. Ataxia: Brain MRI: Negative

## 2024-02-09 NOTE — Progress Notes (Signed)
 Patient Care Team: Helaine Llanos, MD as PCP - General (Internal Medicine) Horatio Lynch as Consulting Physician Teresa Fender, MD (Inactive) as Referring Physician (Obstetrics and Gynecology) Cameron Cea, MD as Consulting Physician (Hematology and Oncology) Colie Dawes, MD as Attending Physician (Radiation Oncology) Oza Blumenthal, MD as Consulting Physician (General Surgery) Alane Hsu, RN as Oncology Nurse Navigator Auther Bo, RN as Oncology Nurse Navigator  DIAGNOSIS:  Encounter Diagnosis  Name Primary?   Malignant neoplasm of upper-outer quadrant of right breast in female, estrogen receptor positive (HCC) Yes    SUMMARY OF ONCOLOGIC HISTORY: Oncology History  Malignant neoplasm of upper-outer quadrant of right breast in female, estrogen receptor positive (HCC)  03/04/2023 Initial Diagnosis   Palpable right breast lump UOQ 12 o'clock position: 1.7 cm by ultrasound, 3 abnormal lymph nodes: 1 biopsy positive with extranodal extension.  Biopsy of the lump: Grade 2 IDC with medullary features ER 60% weak, PR 0%, Ki-67 80%, HER2 negative   03/16/2023 Cancer Staging   Staging form: Breast, AJCC 8th Edition - Clinical: Stage IIA (cT1c, cN1, cM0, G2, ER+, PR-, HER2-) - Signed by Cameron Cea, MD on 03/16/2023 Stage prefix: Initial diagnosis Histologic grading system: 3 grade system   03/24/2023 Genetic Testing   Negative [facility] Common Hereditary Cancers +RNA Panel.  Report date is 03/24/2023.    The [facility] Common Hereditary Cancers + RNA Panel includes sequencing, deletion/duplication, and RNA analysis of the following 48 genes: APC, ATM, AXIN2, BAP1, BARD1, BMPR1A, BRCA1, BRCA2, BRIP1, CDH1, CDK4*, CDKN2A*, CHEK2, CTNNA1, DICER1, EPCAM* (del/dup only), FH, GREM1* (promoter dup analysis only), HOXB13*, KIT*, MBD4*, MEN1, MLH1, MSH2, MSH3, MSH6, MUTYH, NF1, NTHL1, PALB2, PDGFRA*, PMS2, POLD1, POLE, PTEN, RAD51C, RAD51D, SDHA (sequencing only), SDHB, SDHC, SDHD,  SMAD4, SMARCA4, STK11, TP53, TSC1, TSC2, VHL.  *Genes without RNA analysis.     03/25/2023 - 05/26/2023 Chemotherapy   Patient is on Treatment Plan : BREAST TC q21d x 4 cycles     07/05/2023 Surgery   Right lumpectomy: Grade 2 IDC 1.5 cm, ER 60%, PR 0%, HER2 0, Ki67 80%, 1/9 lymph nodes positive (repeat prognostic panel: ER 0%, PR 0%, Ki67 30%, HER2 1+)    08/02/2023 Cancer Staging   Staging form: Breast, AJCC 8th Edition - Pathologic stage from 08/02/2023: ypT1c, pN1a, cM0, G2, ER-, PR-, HER2- - Signed by Colie Dawes, MD on 08/22/2023 Stage prefix: Post-therapy Response to neoadjuvant therapy: Partial response Method of lymph node assessment: Axillary lymph node dissection Multigene prognostic tests performed: Other Histologic grading system: 3 grade system   08/16/2023 - 09/30/2023 Radiation Therapy   Plan Name: Breast_R Site: Breast, Right Technique: 3D Mode: Photon Dose Per Fraction: 2 Gy Prescribed Dose (Delivered / Prescribed): 50 Gy / 50 Gy Prescribed Fxs (Delivered / Prescribed): 25 / 25   Plan Name: Breast_R_PAB_ Site: Breast, Right Technique: 3D Mode: Photon Dose Per Fraction: 2 Gy Prescribed Dose (Delivered / Prescribed): 50 Gy / 50 Gy Prescribed Fxs (Delivered / Prescribed): 25 / 25   Plan Name: Breast_R_Bst Site: Breast, Right Technique: 3D Mode: Photon Dose Per Fraction: 2 Gy Prescribed Dose (Delivered / Prescribed): 10 Gy / 10 Gy Prescribed Fxs (Delivered / Prescribed): 5 / 5     10/03/2023 -  Adjuvant Chemotherapy   Capecitabine  BID 14 days on and 7 days off--repeated every 21 days.      CHIEF COMPLIANT:   HISTORY OF PRESENT ILLNESS: Discussed the use of AI scribe software for clinical note transcription with the patient, who gave  verbal consent to proceed.  History of Present Illness Heidi Mitchell is a 73 year old female with breast cancer who presents with breast heaviness and bulging veins.  She experiences intermittent heaviness in her breast  with prominent bulging veins underneath. There is persistent numbness under her arm. She has been unable to continue certain medications as planned. A mammogram is scheduled for June 18th.     ALLERGIES:  is allergic to sertraline  hcl and excedrin tension headache [acetaminophen -caffeine].  MEDICATIONS:  Current Outpatient Medications  Medication Sig Dispense Refill   omeprazole  (PRILOSEC) 20 MG capsule Take 1 capsule (20 mg total) by mouth every other day. On an empty stomach 45 capsule 1   No current facility-administered medications for this visit.    PHYSICAL EXAMINATION: ECOG PERFORMANCE STATUS: 1 - Symptomatic but completely ambulatory  Vitals:   02/09/24 1111  BP: (!) 122/44  Pulse: (!) 53  Resp: 12  Temp: 97.7 F (36.5 C)  SpO2: 99%   Filed Weights   02/09/24 1111  Weight: 116 lb 14.4 oz (53 kg)    Physical Exam BREAST: Breast with lymphedema, fluid accumulation present.  (exam performed in the presence of a chaperone)  LABORATORY DATA:  I have reviewed the data as listed    Latest Ref Rng & Units 02/09/2024   10:30 AM 01/09/2024   10:15 AM 12/08/2023   10:00 AM  CMP  Glucose 70 - 99 mg/dL 85  86  81   BUN 8 - 23 mg/dL 13  13  15    Creatinine 0.44 - 1.00 mg/dL 8.11  9.14  7.82   Sodium 135 - 145 mmol/L 140  141  139   Potassium 3.5 - 5.1 mmol/L 4.4  4.4  3.9   Chloride 98 - 111 mmol/L 106  109  107   CO2 22 - 32 mmol/L 29  29  28    Calcium  8.9 - 10.3 mg/dL 9.5  9.4  9.5   Total Protein 6.5 - 8.1 g/dL 6.8  6.7  6.8   Total Bilirubin 0.0 - 1.2 mg/dL 1.6  1.5  2.9   Alkaline Phos 38 - 126 U/L 67  72  74   AST 15 - 41 U/L 19  19  20    ALT 0 - 44 U/L 10  9  14      Lab Results  Component Value Date   WBC 5.8 02/09/2024   HGB 12.0 02/09/2024   HCT 37.8 02/09/2024   MCV 92.9 02/09/2024   PLT 234 02/09/2024   NEUTROABS 3.6 02/09/2024    ASSESSMENT & PLAN:  Malignant neoplasm of upper-outer quadrant of right breast in female, estrogen receptor positive  (HCC) 03/04/2023:Palpable right breast lump UOQ 12 o'clock position: 1.7 cm by ultrasound, 3 abnormal lymph nodes: 1 biopsy positive with extranodal extension.  Biopsy of the lump: Grade 2 IDC with medullary features ER 60% weak, PR 0%, Ki-67 80%, HER2 negative  03/24/2023: Breast MRI: Biopsy-proven malignancy 1.8 cm, non-mass enhancement measuring 3 cm, biopsy-proven axillary lymph node.  MRI guided biopsy is being planned for 04/01/2023   Treatment plan: Neoadjuvant chemotherapy with Taxotere  and Cytoxan  every 3 weeks x 4 completed 05/26/2023 Breast conserving surgery with targeted node dissection: 07/05/2023: Grade 2 IDC 1.5 cm, ER 60%, PR 0%, HER2 0, Ki67 80%, 1/9 lymph nodes positive (repeat prognostic panel: ER 0%, PR 0%, Ki67 30%, HER2 1+) Adjuvant radiation 08/17/2023-09/29/2023 No role of adjuvant antiestrogen therapy since she is ER negative Adjuvant Xeloda  started 10/04/2023 (  discontinued February 2025) --------------------------------------------------------------------------------------------- Eye symptoms: Photophobia, tearing and gritty sensation: Xeloda  discontinued and her symptoms are starting to get better.   Breast cancer surveillance:  Ordered mammogram to be done in July 2025 Survivorship care plan visit in June 2025 CT CAP 11/30/2023: No evidence of metastatic disease.  Simple cyst left lobe of the liver 1 cm multiple cysts throughout bilateral kidneys largest 1.5 cm   Elevated bilirubin: Improved after stopping Xeloda .    Ataxia: Brain MRI: Negative  Assessment & Plan Malignant neoplasm of upper-outer quadrant of right breast Estrogen receptor-positive breast cancer with slight improvement in imbalance. Blood tests recommended biannually to monitor recurrence and reduce radiation exposure. - Order blood test every six months to monitor for recurrence. - Avoid unnecessary scans if blood test is negative.  Lymphedema of right breast Confirmed lymphedema due to improper fluid  drainage. Early intervention with physical therapy expected to improve symptoms. - Refer to physical therapy for lymphedema management. - Consider compression garments and massage therapy to aid fluid drainage.      No orders of the defined types were placed in this encounter.  The patient has a good understanding of the overall plan. she agrees with it. she will call with any problems that may develop before the next visit here. Total time spent: 30 mins including face to face time and time spent for planning, charting and co-ordination of care   Margert Sheerer, MD 02/09/24

## 2024-02-10 ENCOUNTER — Telehealth: Payer: Self-pay

## 2024-02-10 NOTE — Telephone Encounter (Signed)
 Per md orders entered for [employer] and all supported documents faxed to [phone]. Faxed confirmation was received.

## 2024-02-13 ENCOUNTER — Telehealth: Payer: Self-pay

## 2024-02-15 NOTE — Therapy (Signed)
 OUTPATIENT PHYSICAL THERAPY  UPPER EXTREMITY ONCOLOGY EVALUATION  Patient Name: Heidi Mitchell MRN: [id number] DOB:1951-05-23, 73 y.o., female Today's Date: 02/16/2024  END OF SESSION:  PT End of Session - 02/16/24 1304     Visit Number 1    Number of Visits 9    Date for PT Re-Evaluation 03/15/24    PT Start Time 1300    PT Stop Time 1345    PT Time Calculation (min) 45 min    Activity Tolerance Patient tolerated treatment well    Behavior During Therapy WFL for tasks assessed/performed             Past Medical History:  Diagnosis Date   GERD (gastroesophageal reflux disease)    Hyperlipidemia    Intracervical pessary    Unspecified disorder of bladder    Uterine prolapse    Past Surgical History:  Procedure Laterality Date   APPENDECTOMY  1990   bartholins cyst  1978   BREAST BIOPSY Left    benign   BREAST BIOPSY Right 03/04/2023   US  RT BREAST BX W LOC DEV 1ST LESION IMG BX SPEC US  GUIDE 03/04/2023 [facility]   BREAST BIOPSY  07/04/2023   US  RT RADIOACTIVE SEED LOC 07/04/2023 [facility]   BREAST BIOPSY  07/04/2023   MM RT RADIOACTIVE SEED LOC MAMMO GUIDE 07/04/2023 [facility]   BREAST LUMPECTOMY WITH RADIOACTIVE SEED AND AXILLARY LYMPH NODE DISSECTION Right 07/05/2023   Procedure: RADIOACTIVE SEED GUIDED RIGHT BREAST LUMPECTOMY AND TARGETED RIGHT AXILLARY LYMPH NODE DISSECTION;  Surgeon: Oza Blumenthal, MD;  Location: [facility];  Service: General;  Laterality: Right;  LMA PEC BLOCK   COLONOSCOPY WITH PROPOFOL  N/A 09/27/2018   Procedure: COLONOSCOPY WITH PROPOFOL ;  Surgeon: Luke Salaam, MD;  Location: [facility];  Service: Gastroenterology;  Laterality: N/A;   IR IMAGING GUIDED PORT INSERTION  03/24/2023   PORT-A-CATH REMOVAL Left 07/05/2023   Procedure: REMOVAL PORT-A-CATH;  Surgeon: Oza Blumenthal, MD;  Location: [facility];  Service: General;  Laterality: Left;   Patient Active Problem List    Diagnosis Date Noted   Preventative health care 10/10/2023   Atrophic vaginitis 10/10/2023   Port-A-Cath in place 04/15/2023   Genetic testing 03/25/2023   Malignant neoplasm of upper-outer quadrant of right breast in female, estrogen receptor positive (HCC) 03/14/2023   Benign positional vertigo 10/06/2022   Esophageal dysphagia 08/10/2021   Hyperlipidemia, unspecified 07/29/2017    PCP: Curt Dover, MD  REFERRING PROVIDER: Cameron Cea, MD  REFERRING DIAG: C50.411,Z17.0 (ICD-10-CM) - Malignant neoplasm of upper-outer quadrant of right breast in female, estrogen receptor positive (HCC)  THERAPY DIAG:  Lymphedema, not elsewhere classified  Abnormal posture  Malignant neoplasm of upper-outer quadrant of right breast in female, estrogen receptor positive (HCC)  ONSET DATE: 01/12/24  Rationale for Evaluation and Treatment: Rehabilitation  SUBJECTIVE:  SUBJECTIVE STATEMENT: My R breast began swelling about 3 to 4 weeks ago. It feels heavy and sore. The veins underneath seem to be bulging and around the nipple is hard.   PERTINENT HISTORY: Patient was diagnosed on 02/28/2023 with right grade 2 invasive ductal carcinoma breast cancer. It measures 1.7 cm and is located in the upper outer quadrant. It is weakly ER positive, PR negative and HER2 negative with a Ki67 of 80%. She has a positive axillary lymph node. 07/05/23- R breast lumpectomy and SLNB 1/9   PAIN:  Are you having pain? Yes NPRS scale: 1/10 Pain location: R breast Pain orientation: Right  PAIN TYPE: aching and heavy Pain description: constant  Aggravating factors: lifting something heavy Relieving factors: massage  PRECAUTIONS: Other: R UE lymphedema risk  RED FLAGS: None   WEIGHT BEARING RESTRICTIONS: No  FALLS:  Has patient  fallen in last 6 months? No  LIVING ENVIRONMENT: Lives with: lives with their spouse Lives in: House/apartment Stairs: No Has following equipment at home: Grab bars  OCCUPATION: retired  LEISURE: walking every day currently getting 7-8,000  HAND DOMINANCE: right   PRIOR LEVEL OF FUNCTION: Independent  PATIENT GOALS: to get the swelling and discomfort out of the R breast   OBJECTIVE: Note: Objective measures were completed at Evaluation unless otherwise noted.  COGNITION: Overall cognitive status: Within functional limits for tasks assessed   PALPATION: Increased fibrosis palpable in inferior breast, tightness palpable at R pec insertion  OBSERVATIONS / OTHER ASSESSMENTS: R breast with visible edema  POSTURE: forward head, rounded shoulders  UPPER EXTREMITY AROM/PROM: WFL but does have tightness at end range in R pec   LYMPHEDEMA ASSESSMENTS:   SURGERY TYPE/DATE: 07/05/23- R breast lumpectomy and SLNB  NUMBER OF LYMPH NODES REMOVED: 1/9   CHEMOTHERAPY: completed neoadjuvant  RADIATION:completed  HORMONE TREATMENT: none  INFECTIONS: none   LYMPHEDEMA ASSESSMENTS:   LANDMARK RIGHT  eval  At axilla  29  15 cm proximal to olecranon process 26  10 cm proximal to olecranon process 24.4  Olecranon process 21.2  15 cm proximal to ulnar styloid process 19.1  10 cm proximal to ulnar styloid process 17  Just proximal to ulnar styloid process 13.5  Across hand at thumb web space 15.5  At base of 2nd digit 5.4  (Blank rows = not tested)  LANDMARK LEFT  eval  At axilla  29  15 cm proximal to olecranon process 26  10 cm proximal to olecranon process 24.4  Olecranon process 21.5  15 cm proximal to ulnar styloid process 18.6  10 cm proximal to ulnar styloid process 16.8  Just proximal to ulnar styloid process 13.4  Across hand at thumb web space 15.5  At base of 2nd digit 5  (Blank rows = not tested)   BREAST COMPLAINTS QUESTIONNAIRE Pain: 1 Heaviness:  8 Swollen feeling: 3 Tense Skin: 6 Redness: 1  Bra Print: 1 Size of Pores: 0 Hard feeling:  5 Total:    25 /80 A Score over 9 indicates lymphedema issues in the breast  TREATMENT DATE:  02/16/23: Educated pt in lymphedema risk reduction practices, need for MLD and compression for management, issued new script for WearEase wavy compression bra at [facility] [place], created foam chip pack for pt to wear in her bra for additional compression at inferior breast    PATIENT EDUCATION:  Education details: lymphedema risk reduction, anatomy and physiology of the lymphatic system, wear chip pack in compression bra, need for compression bra Person educated: Patient Education method: Explanation Education comprehension: verbalized understanding  HOME EXERCISE PROGRAM: Wear compression bra and can use chip pack to increase compression and soften fibrosis  ASSESSMENT:  CLINICAL IMPRESSION: Patient is a 73 y.o. female who was seen today for physical therapy evaluation and treatment for R breast lymphedema. Pt underwent a R lumpectomy and SLNB 1/9 on 07/05/23. She has completed neoadjuvant chemo and radiation. She developed swelling in her R breast four weeks ago. She reports heaviness and discomfort. There is fibrosis palpable especially in inferior breast as well as palpable tightness in R pec. She would benefit from skilled PT services to decrease R breast lymphedema, instruct pt in independent in management of lymphedema and decrease tightness across R pec.    OBJECTIVE IMPAIRMENTS: decreased knowledge of condition, decreased knowledge of use of DME, increased edema, increased fascial restrictions, postural dysfunction, and pain.   ACTIVITY LIMITATIONS: carrying and lifting  PARTICIPATION LIMITATIONS: community activity  PERSONAL FACTORS: Time since onset of  injury/illness/exacerbation are also affecting patient's functional outcome.   REHAB POTENTIAL: Good  CLINICAL DECISION MAKING: Stable/uncomplicated  EVALUATION COMPLEXITY: Low  GOALS: Goals reviewed with patient? Yes  SHORT TERM GOALS=LONG TERM GOALS Target date: 03/15/24  Pt will be independent in self MLD for long term management of lymphedema. Baseline: Goal status: INITIAL  2.  Pt will obtain appropriate compression bras for long term management of lymphedema. Baseline:  Goal status: INITIAL  3.  Pt will report a 50% improvement in feelings of heaviness and discomfort in R breast to allow improved comfort.  Baseline:  Goal status: INITIAL  4.  Pt will be independent in a home exercise program for pec stretching to allow improved comfort with end range R shoulder ROM.  Baseline:  Goal status: INITIAL   PLAN:  PT FREQUENCY: 2x/week  PT DURATION: 4 weeks  PLANNED INTERVENTIONS: 97164- PT Re-evaluation, 97110-Therapeutic exercises, 97530- Therapeutic activity, 97112- Neuromuscular re-education, 97535- Self Care, 10272- Manual therapy, 97760- Orthotic Initial, and [phone]- Orthotic/Prosthetic subsequent  PLAN FOR NEXT SESSION: instruct in MLD to R breast, did she get appt at [facility]?, give pec stretches  Laurianne Floresca Breedlove Blue, PT 02/16/2024, 2:00 PM

## 2024-02-15 NOTE — Telephone Encounter (Signed)
 Open in error

## 2024-02-16 ENCOUNTER — Encounter: Payer: Self-pay | Admitting: Physical Therapy

## 2024-02-16 ENCOUNTER — Other Ambulatory Visit: Payer: Self-pay

## 2024-02-16 ENCOUNTER — Ambulatory Visit: Attending: Hematology and Oncology | Admitting: Physical Therapy

## 2024-02-16 DIAGNOSIS — Z170 Estrogen receptor positive status [ER+]: Secondary | ICD-10-CM | POA: Diagnosis not present

## 2024-02-16 DIAGNOSIS — C50411 Malignant neoplasm of upper-outer quadrant of right female breast: Secondary | ICD-10-CM | POA: Insufficient documentation

## 2024-02-16 DIAGNOSIS — I890 Lymphedema, not elsewhere classified: Secondary | ICD-10-CM | POA: Insufficient documentation

## 2024-02-16 DIAGNOSIS — R293 Abnormal posture: Secondary | ICD-10-CM | POA: Diagnosis not present

## 2024-02-17 ENCOUNTER — Ambulatory Visit

## 2024-02-17 DIAGNOSIS — I890 Lymphedema, not elsewhere classified: Secondary | ICD-10-CM

## 2024-02-17 DIAGNOSIS — R293 Abnormal posture: Secondary | ICD-10-CM | POA: Diagnosis not present

## 2024-02-17 DIAGNOSIS — C50411 Malignant neoplasm of upper-outer quadrant of right female breast: Secondary | ICD-10-CM

## 2024-02-17 DIAGNOSIS — Z170 Estrogen receptor positive status [ER+]: Secondary | ICD-10-CM | POA: Diagnosis not present

## 2024-02-17 NOTE — Therapy (Signed)
 OUTPATIENT PHYSICAL THERAPY  UPPER EXTREMITY ONCOLOGYTREATMENT  Patient Name: Heidi Mitchell MRN: [id number] DOB:03-18-1951, 73 y.o., female Today's Date: 02/17/2024  END OF SESSION:  PT End of Session - 02/17/24 0955     Visit Number 2    Number of Visits 9    Date for PT Re-Evaluation 03/15/24    PT Start Time 0956    PT Stop Time 1101    PT Time Calculation (min) 65 min    Activity Tolerance Patient tolerated treatment well    Behavior During Therapy WFL for tasks assessed/performed             Past Medical History:  Diagnosis Date   GERD (gastroesophageal reflux disease)    Hyperlipidemia    Intracervical pessary    Unspecified disorder of bladder    Uterine prolapse    Past Surgical History:  Procedure Laterality Date   APPENDECTOMY  1990   bartholins cyst  1978   BREAST BIOPSY Left    benign   BREAST BIOPSY Right 03/04/2023   US  RT BREAST BX W LOC DEV 1ST LESION IMG BX SPEC US  GUIDE 03/04/2023 [facility]   BREAST BIOPSY  07/04/2023   US  RT RADIOACTIVE SEED LOC 07/04/2023 [facility]   BREAST BIOPSY  07/04/2023   MM RT RADIOACTIVE SEED LOC MAMMO GUIDE 07/04/2023 [facility]   BREAST LUMPECTOMY WITH RADIOACTIVE SEED AND AXILLARY LYMPH NODE DISSECTION Right 07/05/2023   Procedure: RADIOACTIVE SEED GUIDED RIGHT BREAST LUMPECTOMY AND TARGETED RIGHT AXILLARY LYMPH NODE DISSECTION;  Surgeon: Oza Blumenthal, MD;  Location: [facility];  Service: General;  Laterality: Right;  LMA PEC BLOCK   COLONOSCOPY WITH PROPOFOL  N/A 09/27/2018   Procedure: COLONOSCOPY WITH PROPOFOL ;  Surgeon: Luke Salaam, MD;  Location: [facility];  Service: Gastroenterology;  Laterality: N/A;   IR IMAGING GUIDED PORT INSERTION  03/24/2023   PORT-A-CATH REMOVAL Left 07/05/2023   Procedure: REMOVAL PORT-A-CATH;  Surgeon: Oza Blumenthal, MD;  Location: [facility];  Service: General;  Laterality: Left;   Patient Active Problem List    Diagnosis Date Noted   Preventative health care 10/10/2023   Atrophic vaginitis 10/10/2023   Port-A-Cath in place 04/15/2023   Genetic testing 03/25/2023   Malignant neoplasm of upper-outer quadrant of right breast in female, estrogen receptor positive (HCC) 03/14/2023   Benign positional vertigo 10/06/2022   Esophageal dysphagia 08/10/2021   Hyperlipidemia, unspecified 07/29/2017    PCP: Curt Dover, MD  REFERRING PROVIDER: Cameron Cea, MD  REFERRING DIAG: C50.411,Z17.0 (ICD-10-CM) - Malignant neoplasm of upper-outer quadrant of right breast in female, estrogen receptor positive (HCC)  THERAPY DIAG:  Lymphedema, not elsewhere classified  Abnormal posture  Malignant neoplasm of upper-outer quadrant of right breast in female, estrogen receptor positive (HCC)  ONSET DATE: 01/12/24  Rationale for Evaluation and Treatment: Rehabilitation  SUBJECTIVE:  SUBJECTIVE STATEMENT: My pain is better today, I don't have any in my breast right now. I have an appt with [facility] soon, I think it's June 15.   PERTINENT HISTORY: Patient was diagnosed on 02/28/2023 with right grade 2 invasive ductal carcinoma breast cancer. It measures 1.7 cm and is located in the upper outer quadrant. It is weakly ER positive, PR negative and HER2 negative with a Ki67 of 80%. She has a positive axillary lymph node. 07/05/23- R breast lumpectomy and SLNB 1/9   PAIN:  Are you having pain? No, not today, just some tingling in my fingers that comes and goes from chemo.  PRECAUTIONS: Other: R UE lymphedema risk  RED FLAGS: None   WEIGHT BEARING RESTRICTIONS: No  FALLS:  Has patient fallen in last 6 months? No  LIVING ENVIRONMENT: Lives with: lives with their spouse Lives in: House/apartment Stairs: No Has following  equipment at home: Grab bars  OCCUPATION: retired  LEISURE: walking every day currently getting 7-8,000  HAND DOMINANCE: right   PRIOR LEVEL OF FUNCTION: Independent  PATIENT GOALS: to get the swelling and discomfort out of the R breast   OBJECTIVE: Note: Objective measures were completed at Evaluation unless otherwise noted.  COGNITION: Overall cognitive status: Within functional limits for tasks assessed   PALPATION: Increased fibrosis palpable in inferior breast, tightness palpable at R pec insertion  OBSERVATIONS / OTHER ASSESSMENTS: R breast with visible edema  POSTURE: forward head, rounded shoulders  UPPER EXTREMITY AROM/PROM: WFL but does have tightness at end range in R pec   LYMPHEDEMA ASSESSMENTS:   SURGERY TYPE/DATE: 07/05/23- R breast lumpectomy and SLNB  NUMBER OF LYMPH NODES REMOVED: 1/9   CHEMOTHERAPY: completed neoadjuvant  RADIATION:completed  HORMONE TREATMENT: none  INFECTIONS: none   LYMPHEDEMA ASSESSMENTS:   LANDMARK RIGHT  eval  At axilla  29  15 cm proximal to olecranon process 26  10 cm proximal to olecranon process 24.4  Olecranon process 21.2  15 cm proximal to ulnar styloid process 19.1  10 cm proximal to ulnar styloid process 17  Just proximal to ulnar styloid process 13.5  Across hand at thumb web space 15.5  At base of 2nd digit 5.4  (Blank rows = not tested)  LANDMARK LEFT  eval  At axilla  29  15 cm proximal to olecranon process 26  10 cm proximal to olecranon process 24.4  Olecranon process 21.5  15 cm proximal to ulnar styloid process 18.6  10 cm proximal to ulnar styloid process 16.8  Just proximal to ulnar styloid process 13.4  Across hand at thumb web space 15.5  At base of 2nd digit 5  (Blank rows = not tested)   BREAST COMPLAINTS QUESTIONNAIRE Pain: 1 Heaviness: 8 Swollen feeling: 3 Tense Skin: 6 Redness: 1  Bra Print: 1 Size of Pores: 0 Hard feeling:  5 Total:    25 /80 A Score over 9 indicates  lymphedema issues in the breast  TREATMENT DATE:  02/17/23: Self Care Reviewed anatomy of lymphatic system and basic principles of MLD before starting MLD today so pt would understanding therapists' hand placements.  Manual Therapy Pts permission granted to work on breast tissue and she was draped appropriately throughout.  MLD to Rt breast: Short neck, superficial and deep abdominals (though educated pt in 5 diaphragmatic breaths for HEP), Lt axillary and pectoral nodes, anterior intact thorax sequence, anterior inter-axillary anastomosis (had pt return demo of anastomosis), then Rt inguinal nodes, Rt axillo-inguinal anastomosis (had pt return demo of this as well), then focused on superior and medial breast to anterior pathway, then into Lt S/L for focus on inferior and lateral breast to lateral pathway having pt return all breast hand placements back to pathways; then finished retracing all steps in supine. Hand over hand technique for lighter pressure but pt did excellent with skin stretch as she reports she used to help her daughter in law with this before she passed from cancer years ago.  P/ROM to Rt shoulder during MLD into flex and abd to pts tolerance and with scapular depression throughout by therapist STM to Rt pect insertion where pt palpably tight   02/16/23: Educated pt in lymphedema risk reduction practices, need for MLD and compression for management, issued new script for WearEase wavy compression bra at 2nd to [place], created foam chip pack for pt to wear in her bra for additional compression at inferior breast    PATIENT EDUCATION:  Education details: Self MLD Person educated: Patient Education method: Programmer, multimedia, demonstration, tactile and VC's, handout issued Education comprehension: verbalized understanding, returned demo, tactile and VC's and pt will  benefit from further review  HOME EXERCISE PROGRAM: Wear compression bra and can use chip pack to increase compression and soften fibrosis Self MLD daily  ASSESSMENT:  CLINICAL IMPRESSION: Began MLD and started instructing pt in this today as well. She will benefit from further review to reinforce understanding of sequence and light pressure. Also cont with P/ROM and STM to Rt UE/pect insertion. She has an appt to be measured for compression bras at [facility] [place].    OBJECTIVE IMPAIRMENTS: decreased knowledge of condition, decreased knowledge of use of DME, increased edema, increased fascial restrictions, postural dysfunction, and pain.   ACTIVITY LIMITATIONS: carrying and lifting  PARTICIPATION LIMITATIONS: community activity  PERSONAL FACTORS: Time since onset of injury/illness/exacerbation are also affecting patient's functional outcome.   REHAB POTENTIAL: Good  CLINICAL DECISION MAKING: Stable/uncomplicated  EVALUATION COMPLEXITY: Low  GOALS: Goals reviewed with patient? Yes  SHORT TERM GOALS=LONG TERM GOALS Target date: 03/15/24  Pt will be independent in self MLD for long term management of lymphedema. Baseline: Goal status: INITIAL  2.  Pt will obtain appropriate compression bras for long term management of lymphedema. Baseline:  Goal status: INITIAL  3.  Pt will report a 50% improvement in feelings of heaviness and discomfort in R breast to allow improved comfort.  Baseline:  Goal status: INITIAL  4.  Pt will be independent in a home exercise program for pec stretching to allow improved comfort with end range R shoulder ROM.  Baseline:  Goal status: INITIAL   PLAN:  PT FREQUENCY: 2x/week  PT DURATION: 4 weeks  PLANNED INTERVENTIONS: 97164- PT Re-evaluation, 97110-Therapeutic exercises, 97530- Therapeutic activity, [id number]- Neuromuscular re-education, 97535- Self Care, 16109- Manual therapy, 97760- Orthotic Initial, and [id number]- Orthotic/Prosthetic  subsequent  PLAN FOR NEXT SESSION: Cont and review MLD to R breast, re-issue post op handout per her request (forgot today)  and give pec stretches in doorway  Denyce Flank, [place] 02/17/2024, 12:30 PM

## 2024-02-22 ENCOUNTER — Ambulatory Visit

## 2024-02-22 DIAGNOSIS — R293 Abnormal posture: Secondary | ICD-10-CM

## 2024-02-22 DIAGNOSIS — C50411 Malignant neoplasm of upper-outer quadrant of right female breast: Secondary | ICD-10-CM

## 2024-02-22 DIAGNOSIS — I890 Lymphedema, not elsewhere classified: Secondary | ICD-10-CM

## 2024-02-22 DIAGNOSIS — Z170 Estrogen receptor positive status [ER+]: Secondary | ICD-10-CM | POA: Diagnosis not present

## 2024-02-22 NOTE — Therapy (Signed)
 OUTPATIENT PHYSICAL THERAPY  UPPER EXTREMITY ONCOLOGYTREATMENT  Patient Name: Heidi Mitchell MRN: [id number] DOB:[birth year], 73 y.o., female Today's Date: 02/22/2024  END OF SESSION:  PT End of Session - 02/22/24 1210     Visit Number 3    Number of Visits 9    Date for PT Re-Evaluation 03/15/24    PT Start Time 1203    PT Stop Time 1259    PT Time Calculation (min) 56 min    Activity Tolerance Patient tolerated treatment well    Behavior During Therapy WFL for tasks assessed/performed             Past Medical History:  Diagnosis Date   GERD (gastroesophageal reflux disease)    Hyperlipidemia    Intracervical pessary    Unspecified disorder of bladder    Uterine prolapse    Past Surgical History:  Procedure Laterality Date   APPENDECTOMY  1990   bartholins cyst  1978   BREAST BIOPSY Left    benign   BREAST BIOPSY Right 03/04/2023   US  RT BREAST BX W LOC DEV 1ST LESION IMG BX SPEC US  GUIDE 03/04/2023 [facility]   BREAST BIOPSY  07/04/2023   US  RT RADIOACTIVE SEED LOC 07/04/2023 [facility]   BREAST BIOPSY  07/04/2023   MM RT RADIOACTIVE SEED LOC MAMMO GUIDE 07/04/2023 [facility]   BREAST LUMPECTOMY WITH RADIOACTIVE SEED AND AXILLARY LYMPH NODE DISSECTION Right 07/05/2023   Procedure: RADIOACTIVE SEED GUIDED RIGHT BREAST LUMPECTOMY AND TARGETED RIGHT AXILLARY LYMPH NODE DISSECTION;  Surgeon: Oza Blumenthal, MD;  Location: [facility];  Service: General;  Laterality: Right;  LMA PEC BLOCK   COLONOSCOPY WITH PROPOFOL  N/A 09/27/2018   Procedure: COLONOSCOPY WITH PROPOFOL ;  Surgeon: Luke Salaam, MD;  Location: [facility];  Service: Gastroenterology;  Laterality: N/A;   IR IMAGING GUIDED PORT INSERTION  03/24/2023   PORT-A-CATH REMOVAL Left 07/05/2023   Procedure: REMOVAL PORT-A-CATH;  Surgeon: Oza Blumenthal, MD;  Location: [facility];  Service: General;  Laterality: Left;   Patient Active Problem List    Diagnosis Date Noted   Preventative health care 10/10/2023   Atrophic vaginitis 10/10/2023   Port-A-Cath in place 04/15/2023   Genetic testing 03/25/2023   Malignant neoplasm of upper-outer quadrant of right breast in female, estrogen receptor positive (HCC) 03/14/2023   Benign positional vertigo 10/06/2022   Esophageal dysphagia 08/10/2021   Hyperlipidemia, unspecified 07/29/2017    PCP: Curt Dover, MD  REFERRING PROVIDER: Cameron Cea, MD  REFERRING DIAG: C50.411,Z17.0 (ICD-10-CM) - Malignant neoplasm of upper-outer quadrant of right breast in female, estrogen receptor positive (HCC)  THERAPY DIAG:  Lymphedema, not elsewhere classified  Abnormal posture  Malignant neoplasm of upper-outer quadrant of right breast in female, estrogen receptor positive (HCC)  ONSET DATE: 01/12/24  Rationale for Evaluation and Treatment: Rehabilitation  SUBJECTIVE:  SUBJECTIVE STATEMENT: I've done the self MLD a few times and I think it's going well. The nipple still feels hard sometimes when the swelling is worse but it has been some better after I do the self MLD. I've also been trying to use my arm more and stretch it.   PERTINENT HISTORY: Patient was diagnosed on 02/28/2023 with right grade 2 invasive ductal carcinoma breast cancer. It measures 1.7 cm and is located in the upper outer quadrant. It is weakly ER positive, PR negative and HER2 negative with a Ki67 of 80%. She has a positive axillary lymph node. 07/05/23- R breast lumpectomy and SLNB 1/9   PAIN:  Are you having pain? No, not today, just some tingling in my fingers that comes and goes from chemo.  PRECAUTIONS: Other: R UE lymphedema risk  RED FLAGS: None   WEIGHT BEARING RESTRICTIONS: No  FALLS:  Has patient fallen in last 6 months?  No  LIVING ENVIRONMENT: Lives with: lives with their spouse Lives in: House/apartment Stairs: No Has following equipment at home: Grab bars  OCCUPATION: retired  LEISURE: walking every day currently getting 7-8,000  HAND DOMINANCE: right   PRIOR LEVEL OF FUNCTION: Independent  PATIENT GOALS: to get the swelling and discomfort out of the R breast   OBJECTIVE: Note: Objective measures were completed at Evaluation unless otherwise noted.  COGNITION: Overall cognitive status: Within functional limits for tasks assessed   PALPATION: Increased fibrosis palpable in inferior breast, tightness palpable at R pec insertion  OBSERVATIONS / OTHER ASSESSMENTS: R breast with visible edema  POSTURE: forward head, rounded shoulders  UPPER EXTREMITY AROM/PROM: WFL but does have tightness at end range in R pec   LYMPHEDEMA ASSESSMENTS:   SURGERY TYPE/DATE: 07/05/23- R breast lumpectomy and SLNB  NUMBER OF LYMPH NODES REMOVED: 1/9   CHEMOTHERAPY: completed neoadjuvant  RADIATION:completed  HORMONE TREATMENT: none  INFECTIONS: none   LYMPHEDEMA ASSESSMENTS:   LANDMARK RIGHT  eval  At axilla  29  15 cm proximal to olecranon process 26  10 cm proximal to olecranon process 24.4  Olecranon process 21.2  15 cm proximal to ulnar styloid process 19.1  10 cm proximal to ulnar styloid process 17  Just proximal to ulnar styloid process 13.5  Across hand at thumb web space 15.5  At base of 2nd digit 5.4  (Blank rows = not tested)  LANDMARK LEFT  eval  At axilla  29  15 cm proximal to olecranon process 26  10 cm proximal to olecranon process 24.4  Olecranon process 21.5  15 cm proximal to ulnar styloid process 18.6  10 cm proximal to ulnar styloid process 16.8  Just proximal to ulnar styloid process 13.4  Across hand at thumb web space 15.5  At base of 2nd digit 5  (Blank rows = not tested)   BREAST COMPLAINTS QUESTIONNAIRE Pain: 1 Heaviness: 8 Swollen feeling:  3 Tense Skin: 6 Redness: 1  Bra Print: 1 Size of Pores: 0 Hard feeling:  5 Total:    25 /80 A Score over 9 indicates lymphedema issues in the breast  TREATMENT DATE:  02/22/24: Self Care Issued post op handout to pt as she requested another copy. Spent time reviewing these with her by demo and explaining how she wants to feel a stretch, not pain. Also educated her on how she can incorporate stretches into her ADLs when reaching to a high shelf or doing wall slide stretches before she gets out of the shower when her muscles are looser from the warm water. Pt verbalized good understanding of all.  Manual Therapy MLD to Rt breast: Short neck, 5 diaphragmatic breaths, Lt axillary and pectoral nodes, anterior intact thorax sequence, anterior inter-axillary anastomosis (had pt return demo of anastomosis), then Rt inguinal nodes, Rt axillo-inguinal anastomosis (had pt return demo of this as well), then focused on superior and medial breast to anterior pathway, then into Lt S/L for focus on inferior and lateral breast to lateral pathway; then finished retracing all steps in supine. Also had pt return demo of breast sequences, she returned very good demo with correct pressure and skin stretch.  P/ROM to Rt shoulder during MLD into flex and abd to pts tolerance and with scapular depression throughout by therapist STM to Rt pect insertion where pt palpably tight, but less so today  02/17/23: Self Care Reviewed anatomy of lymphatic system and basic principles of MLD before starting MLD today so pt would understanding therapists' hand placements.  Manual Therapy Pts permission granted to work on breast tissue and she was draped appropriately throughout.  MLD to Rt breast: Short neck, superficial and deep abdominals (though educated pt in 5 diaphragmatic breaths for HEP), Lt axillary and  pectoral nodes, anterior intact thorax sequence, anterior inter-axillary anastomosis (had pt return demo of anastomosis), then Rt inguinal nodes, Rt axillo-inguinal anastomosis (had pt return demo of this as well), then focused on superior and medial breast to anterior pathway, then into Lt S/L for focus on inferior and lateral breast to lateral pathway having pt return all breast hand placements back to pathways; then finished retracing all steps in supine. Hand over hand technique for lighter pressure but pt did excellent with skin stretch as she reports she used to help her daughter in law with this before she passed from cancer years ago.  P/ROM to Rt shoulder during MLD into flex and abd to pts tolerance and with scapular depression throughout by therapist STM to Rt pect insertion where pt palpably tight   02/16/23: Educated pt in lymphedema risk reduction practices, need for MLD and compression for management, issued new script for [employer] wavy compression bra at 2nd to [place], created foam chip pack for pt to wear in her bra for additional compression at inferior breast    PATIENT EDUCATION:  Education details: Rt UE post op stretches Person educated: Patient Education method: Explanation, demonstration, and VC's, handout issued Education comprehension: verbalized understanding  HOME EXERCISE PROGRAM: Wear compression bra and can use chip pack to increase compression and soften fibrosis Self MLD daily Post op Rt UE stretches  ASSESSMENT:  CLINICAL IMPRESSION: Continued MLD of Rt breast reviewing with pt while performing and continuing to have her return demo. Some softening noted from last week at medial breast and more so after today's session, pt reports noticing the same. She has an appt at [facility] for compression bras June 17.    OBJECTIVE IMPAIRMENTS: decreased knowledge of condition, decreased knowledge of use of DME, increased edema, increased fascial restrictions,  postural dysfunction, and pain.   ACTIVITY LIMITATIONS: carrying and lifting  PARTICIPATION  LIMITATIONS: community activity  PERSONAL FACTORS: Time since onset of injury/illness/exacerbation are also affecting patient's functional outcome.   REHAB POTENTIAL: Good  CLINICAL DECISION MAKING: Stable/uncomplicated  EVALUATION COMPLEXITY: Low  GOALS: Goals reviewed with patient? Yes  SHORT TERM GOALS=LONG TERM GOALS Target date: 03/15/24  Pt will be independent in self MLD for long term management of lymphedema. Baseline: Goal status: INITIAL  2.  Pt will obtain appropriate compression bras for long term management of lymphedema. Baseline:  Goal status: INITIAL  3.  Pt will report a 50% improvement in feelings of heaviness and discomfort in R breast to allow improved comfort.  Baseline:  Goal status: INITIAL  4.  Pt will be independent in a home exercise program for pec stretching to allow improved comfort with end range R shoulder ROM.  Baseline:  Goal status: INITIAL   PLAN:  PT FREQUENCY: 2x/week  PT DURATION: 4 weeks  PLANNED INTERVENTIONS: 97164- PT Re-evaluation, 97110-Therapeutic exercises, 97530- Therapeutic activity, 97112- Neuromuscular re-education, 97535- Self Care, 91478- Manual therapy, 97760- Orthotic Initial, and [phone]- Orthotic/Prosthetic subsequent  PLAN FOR NEXT SESSION: Cont and review MLD to R breast, and give pec stretches in doorway  Denyce Flank, PTA 02/22/2024, 1:17 PM

## 2024-02-27 ENCOUNTER — Ambulatory Visit

## 2024-02-27 DIAGNOSIS — R293 Abnormal posture: Secondary | ICD-10-CM | POA: Diagnosis not present

## 2024-02-27 DIAGNOSIS — I890 Lymphedema, not elsewhere classified: Secondary | ICD-10-CM

## 2024-02-27 DIAGNOSIS — Z170 Estrogen receptor positive status [ER+]: Secondary | ICD-10-CM

## 2024-02-27 DIAGNOSIS — C50411 Malignant neoplasm of upper-outer quadrant of right female breast: Secondary | ICD-10-CM | POA: Diagnosis not present

## 2024-02-27 NOTE — Therapy (Signed)
 OUTPATIENT PHYSICAL THERAPY  UPPER EXTREMITY ONCOLOGYTREATMENT  Patient Name: Heidi Mitchell MRN: [id number] DOB:1951/08/19, 73 y.o., female Today's Date: 02/27/2024  END OF SESSION:  PT End of Session - 02/27/24 1108     Visit Number 4    Number of Visits 9    Date for PT Re-Evaluation 03/15/24    PT Start Time 1103    PT Stop Time 1200    PT Time Calculation (min) 57 min    Activity Tolerance Patient tolerated treatment well    Behavior During Therapy WFL for tasks assessed/performed          Past Medical History:  Diagnosis Date   GERD (gastroesophageal reflux disease)    Hyperlipidemia    Intracervical pessary    Unspecified disorder of bladder    Uterine prolapse    Past Surgical History:  Procedure Laterality Date   APPENDECTOMY  1990   bartholins cyst  1978   BREAST BIOPSY Left    benign   BREAST BIOPSY Right 03/04/2023   US  RT BREAST BX W LOC DEV 1ST LESION IMG BX SPEC US  GUIDE 03/04/2023 [facility]   BREAST BIOPSY  07/04/2023   US  RT RADIOACTIVE SEED LOC 07/04/2023 [facility]   BREAST BIOPSY  07/04/2023   MM RT RADIOACTIVE SEED LOC MAMMO GUIDE 07/04/2023 [facility]   BREAST LUMPECTOMY WITH RADIOACTIVE SEED AND AXILLARY LYMPH NODE DISSECTION Right 07/05/2023   Procedure: RADIOACTIVE SEED GUIDED RIGHT BREAST LUMPECTOMY AND TARGETED RIGHT AXILLARY LYMPH NODE DISSECTION;  Surgeon: Oza Blumenthal, MD;  Location: [facility];  Service: General;  Laterality: Right;  LMA PEC BLOCK   COLONOSCOPY WITH PROPOFOL  N/A 09/27/2018   Procedure: COLONOSCOPY WITH PROPOFOL ;  Surgeon: Luke Salaam, MD;  Location: [facility];  Service: Gastroenterology;  Laterality: N/A;   IR IMAGING GUIDED PORT INSERTION  03/24/2023   PORT-A-CATH REMOVAL Left 07/05/2023   Procedure: REMOVAL PORT-A-CATH;  Surgeon: Oza Blumenthal, MD;  Location: [facility];  Service: General;  Laterality: Left;   Patient Active Problem List   Diagnosis  Date Noted   Preventative health care 10/10/2023   Atrophic vaginitis 10/10/2023   Port-A-Cath in place 04/15/2023   Genetic testing 03/25/2023   Malignant neoplasm of upper-outer quadrant of right breast in female, estrogen receptor positive (HCC) 03/14/2023   Benign positional vertigo 10/06/2022   Esophageal dysphagia 08/10/2021   Hyperlipidemia, unspecified 07/29/2017    PCP: Curt Dover, MD  REFERRING PROVIDER: Cameron Cea, MD  REFERRING DIAG: C50.411,Z17.0 (ICD-10-CM) - Malignant neoplasm of upper-outer quadrant of right breast in female, estrogen receptor positive (HCC)  THERAPY DIAG:  Lymphedema, not elsewhere classified  Abnormal posture  Malignant neoplasm of upper-outer quadrant of right breast in female, estrogen receptor positive (HCC)  ONSET DATE: 01/12/24  Rationale for Evaluation and Treatment: Rehabilitation  SUBJECTIVE:  SUBJECTIVE STATEMENT: I'm doing the self MLD but I want to review to make sure I'm doing the steps right. I get myself confused sometimes. I get measured for more compression bras tomorrow. I sleep in them every night and wear them during the day when I'm home. I don't like wearing them when I'm out because you can always see the zipper through my shirts.   PERTINENT HISTORY: Patient was diagnosed on 02/28/2023 with right grade 2 invasive ductal carcinoma breast cancer. It measures 1.7 cm and is located in the upper outer quadrant. It is weakly ER positive, PR negative and HER2 negative with a Ki67 of 80%. She has a positive axillary lymph node. 07/05/23- R breast lumpectomy and SLNB 1/9   PAIN:  Are you having pain? No  PRECAUTIONS: Other: R UE lymphedema risk  RED FLAGS: None   WEIGHT BEARING RESTRICTIONS: No  FALLS:  Has patient fallen in last 6 months?  No  LIVING ENVIRONMENT: Lives with: lives with their spouse Lives in: House/apartment Stairs: No Has following equipment at home: Grab bars  OCCUPATION: retired  LEISURE: walking every day currently getting 7-8,000  HAND DOMINANCE: right   PRIOR LEVEL OF FUNCTION: Independent  PATIENT GOALS: to get the swelling and discomfort out of the R breast   OBJECTIVE: Note: Objective measures were completed at Evaluation unless otherwise noted.  COGNITION: Overall cognitive status: Within functional limits for tasks assessed   PALPATION: Increased fibrosis palpable in inferior breast, tightness palpable at R pec insertion  OBSERVATIONS / OTHER ASSESSMENTS: R breast with visible edema  POSTURE: forward head, rounded shoulders  UPPER EXTREMITY AROM/PROM: WFL but does have tightness at end range in R pec   LYMPHEDEMA ASSESSMENTS:   SURGERY TYPE/DATE: 07/05/23- R breast lumpectomy and SLNB  NUMBER OF LYMPH NODES REMOVED: 1/9   CHEMOTHERAPY: completed neoadjuvant  RADIATION:completed  HORMONE TREATMENT: none  INFECTIONS: none   LYMPHEDEMA ASSESSMENTS:   LANDMARK RIGHT  eval  At axilla  29  15 cm proximal to olecranon process 26  10 cm proximal to olecranon process 24.4  Olecranon process 21.2  15 cm proximal to ulnar styloid process 19.1  10 cm proximal to ulnar styloid process 17  Just proximal to ulnar styloid process 13.5  Across hand at thumb web space 15.5  At base of 2nd digit 5.4  (Blank rows = not tested)  LANDMARK LEFT  eval  At axilla  29  15 cm proximal to olecranon process 26  10 cm proximal to olecranon process 24.4  Olecranon process 21.5  15 cm proximal to ulnar styloid process 18.6  10 cm proximal to ulnar styloid process 16.8  Just proximal to ulnar styloid process 13.4  Across hand at thumb web space 15.5  At base of 2nd digit 5  (Blank rows = not tested)   BREAST COMPLAINTS QUESTIONNAIRE Pain: 1 Heaviness: 8 Swollen feeling:  3 Tense Skin: 6 Redness: 1  Bra Print: 1 Size of Pores: 0 Hard feeling:  5 Total:    25 /80 A Score over 9 indicates lymphedema issues in the breast  TREATMENT DATE:  02/27/24: Self Care Spent time reviewing sequence of MLD and answering her questions about directionality of skin stretches and why. Manual Therapy MLD to Rt breast: Short neck, superficial and deep abdominals, Lt axillary and pectoral nodes, anterior intact thorax sequence, anterior inter-axillary anastomosis, then Rt inguinal nodes, Rt axillo-inguinal anastomosis, then focused on superior and medial breast to anterior pathway, then into Lt S/L for focus on inferior and lateral breast to lateral pathway; then finished retracing all steps in supine. Had pt return demo of anastomosis.  P/ROM to Rt shoulder during MLD into flex and abd to pts tolerance and with scapular depression throughout by therapist STM to Rt lateral trunk where palpable tightness. Instructed pt in how she can do this at home when in end ROM Rt shoulder stretches  02/22/24: Self Care Issued post op handout to pt as she requested another copy. Spent time reviewing these with her by demo and explaining how she wants to feel a stretch, not pain. Also educated her on how she can incorporate stretches into her ADLs when reaching to a high shelf or doing wall slide stretches before she gets out of the shower when her muscles are looser from the warm water. Pt verbalized good understanding of all.  Manual Therapy MLD to Rt breast: Short neck, 5 diaphragmatic breaths, Lt axillary and pectoral nodes, anterior intact thorax sequence, anterior inter-axillary anastomosis (had pt return demo of anastomosis), then Rt inguinal nodes, Rt axillo-inguinal anastomosis (had pt return demo of this as well), then focused on superior and medial breast to anterior  pathway, then into Lt S/L for focus on inferior and lateral breast to lateral pathway; then finished retracing all steps in supine. Also had pt return demo of breast sequences, she returned very good demo with correct pressure and skin stretch.  P/ROM to Rt shoulder during MLD into flex and abd to pts tolerance and with scapular depression throughout by therapist STM to Rt pect insertion where pt palpably tight, but less so today  02/17/23: Self Care Reviewed anatomy of lymphatic system and basic principles of MLD before starting MLD today so pt would understanding therapists' hand placements.  Manual Therapy Pts permission granted to work on breast tissue and she was draped appropriately throughout.  MLD to Rt breast: Short neck, superficial and deep abdominals (though educated pt in 5 diaphragmatic breaths for HEP), Lt axillary and pectoral nodes, anterior intact thorax sequence, anterior inter-axillary anastomosis (had pt return demo of anastomosis), then Rt inguinal nodes, Rt axillo-inguinal anastomosis (had pt return demo of this as well), then focused on superior and medial breast to anterior pathway, then into Lt S/L for focus on inferior and lateral breast to lateral pathway having pt return all breast hand placements back to pathways; then finished retracing all steps in supine. Hand over hand technique for lighter pressure but pt did excellent with skin stretch as she reports she used to help her daughter in law with this before she passed from cancer years ago.  P/ROM to Rt shoulder during MLD into flex and abd to pts tolerance and with scapular depression throughout by therapist STM to Rt pect insertion where pt palpably tight      PATIENT EDUCATION:  Education details: Rt UE post op stretches Person educated: Patient Education method: Explanation, demonstration, and VC's, handout issued Education comprehension: verbalized understanding  HOME EXERCISE PROGRAM: Wear compression bra  and can use chip pack to increase compression and soften fibrosis Self MLD daily Post op  Rt UE stretches  ASSESSMENT:  CLINICAL IMPRESSION: Pt is noticing softening of Rt breast and and therapist is noting same over area of firmness at superior lateral breast. Pt is wearing her compression bras at night and when home during day. She gets measured for new ones tomorrow. Reviewed MLD sequence and answered her questions about directionality of skin stretches. Overall she is progressing well towards independence of management of lymphedema symptoms.    OBJECTIVE IMPAIRMENTS: decreased knowledge of condition, decreased knowledge of use of DME, increased edema, increased fascial restrictions, postural dysfunction, and pain.   ACTIVITY LIMITATIONS: carrying and lifting  PARTICIPATION LIMITATIONS: community activity  PERSONAL FACTORS: Time since onset of injury/illness/exacerbation are also affecting patient's functional outcome.   REHAB POTENTIAL: Good  CLINICAL DECISION MAKING: Stable/uncomplicated  EVALUATION COMPLEXITY: Low  GOALS: Goals reviewed with patient? Yes  SHORT TERM GOALS=LONG TERM GOALS Target date: 03/15/24  Pt will be independent in self MLD for long term management of lymphedema. Baseline: Goal status: INITIAL  2.  Pt will obtain appropriate compression bras for long term management of lymphedema. Baseline:  Goal status: INITIAL  3.  Pt will report a 50% improvement in feelings of heaviness and discomfort in R breast to allow improved comfort.  Baseline:  Goal status: INITIAL  4.  Pt will be independent in a home exercise program for pec stretching to allow improved comfort with end range R shoulder ROM.  Baseline:  Goal status: INITIAL   PLAN:  PT FREQUENCY: 2x/week  PT DURATION: 4 weeks  PLANNED INTERVENTIONS: 97164- PT Re-evaluation, 97110-Therapeutic exercises, 97530- Therapeutic activity, 97112- Neuromuscular re-education, 97535- Self Care, 82956-  Manual therapy, 97760- Orthotic Initial, and [phone]- Orthotic/Prosthetic subsequent  PLAN FOR NEXT SESSION: Cont and review MLD to R breast, and give pec stretches in doorway  Denyce Flank, PTA 02/27/2024, 12:21 PM

## 2024-02-28 ENCOUNTER — Encounter: Payer: Self-pay | Admitting: Hematology and Oncology

## 2024-02-28 DIAGNOSIS — C50911 Malignant neoplasm of unspecified site of right female breast: Secondary | ICD-10-CM | POA: Diagnosis not present

## 2024-02-28 DIAGNOSIS — I890 Lymphedema, not elsewhere classified: Secondary | ICD-10-CM | POA: Diagnosis not present

## 2024-02-29 ENCOUNTER — Ambulatory Visit
Admission: RE | Admit: 2024-02-29 | Discharge: 2024-02-29 | Disposition: A | Discharge status: Home / Self Care | Source: Ambulatory Visit | Attending: Hematology and Oncology | Admitting: Hematology and Oncology

## 2024-02-29 ENCOUNTER — Other Ambulatory Visit: Payer: Self-pay | Admitting: Hematology and Oncology

## 2024-02-29 ENCOUNTER — Ambulatory Visit
Admission: RE | Admit: 2024-02-29 | Discharge: 2024-02-29 | Disposition: A | Discharge status: Home / Self Care | Source: Ambulatory Visit | Attending: Hematology and Oncology

## 2024-02-29 DIAGNOSIS — C50411 Malignant neoplasm of upper-outer quadrant of right female breast: Secondary | ICD-10-CM

## 2024-02-29 DIAGNOSIS — N644 Mastodynia: Secondary | ICD-10-CM | POA: Diagnosis not present

## 2024-02-29 DIAGNOSIS — N6001 Solitary cyst of right breast: Secondary | ICD-10-CM | POA: Diagnosis not present

## 2024-02-29 DIAGNOSIS — N6311 Unspecified lump in the right breast, upper outer quadrant: Secondary | ICD-10-CM | POA: Diagnosis not present

## 2024-03-01 ENCOUNTER — Ambulatory Visit

## 2024-03-01 ENCOUNTER — Telehealth: Payer: Self-pay | Admitting: *Deleted

## 2024-03-01 DIAGNOSIS — Z170 Estrogen receptor positive status [ER+]: Secondary | ICD-10-CM

## 2024-03-01 DIAGNOSIS — R293 Abnormal posture: Secondary | ICD-10-CM

## 2024-03-01 DIAGNOSIS — C50411 Malignant neoplasm of upper-outer quadrant of right female breast: Secondary | ICD-10-CM | POA: Diagnosis not present

## 2024-03-01 DIAGNOSIS — I890 Lymphedema, not elsewhere classified: Secondary | ICD-10-CM | POA: Diagnosis not present

## 2024-03-01 NOTE — Therapy (Signed)
 OUTPATIENT PHYSICAL THERAPY  UPPER EXTREMITY ONCOLOGYTREATMENT  Patient Name: LAISHA RAU MRN: [id number] DOB:1951/07/06, 73 y.o., female Today's Date: 03/01/2024  END OF SESSION:  PT End of Session - 03/01/24 1004     Visit Number 5    Number of Visits 9    Date for PT Re-Evaluation 03/15/24    PT Start Time 1002    PT Stop Time 1058    PT Time Calculation (min) 56 min    Activity Tolerance Patient tolerated treatment well    Behavior During Therapy WFL for tasks assessed/performed          Past Medical History:  Diagnosis Date   GERD (gastroesophageal reflux disease)    Hyperlipidemia    Intracervical pessary    Unspecified disorder of bladder    Uterine prolapse    Past Surgical History:  Procedure Laterality Date   APPENDECTOMY  1990   bartholins cyst  1978   BREAST BIOPSY Left    benign   BREAST BIOPSY Right 03/04/2023   US  RT BREAST BX W LOC DEV 1ST LESION IMG BX SPEC US  GUIDE 03/04/2023 [facility]   BREAST BIOPSY  07/04/2023   US  RT RADIOACTIVE SEED LOC 07/04/2023 [facility]   BREAST BIOPSY  07/04/2023   MM RT RADIOACTIVE SEED LOC MAMMO GUIDE 07/04/2023 [facility]   BREAST LUMPECTOMY WITH RADIOACTIVE SEED AND AXILLARY LYMPH NODE DISSECTION Right 07/05/2023   Procedure: RADIOACTIVE SEED GUIDED RIGHT BREAST LUMPECTOMY AND TARGETED RIGHT AXILLARY LYMPH NODE DISSECTION;  Surgeon: Oza Blumenthal, MD;  Location: [facility];  Service: General;  Laterality: Right;  LMA PEC BLOCK   COLONOSCOPY WITH PROPOFOL  N/A 09/27/2018   Procedure: COLONOSCOPY WITH PROPOFOL ;  Surgeon: Luke Salaam, MD;  Location: [facility];  Service: Gastroenterology;  Laterality: N/A;   IR IMAGING GUIDED PORT INSERTION  03/24/2023   PORT-A-CATH REMOVAL Left 07/05/2023   Procedure: REMOVAL PORT-A-CATH;  Surgeon: Oza Blumenthal, MD;  Location: [facility];  Service: General;  Laterality: Left;   Patient Active Problem List   Diagnosis  Date Noted   Preventative health care 10/10/2023   Atrophic vaginitis 10/10/2023   Port-A-Cath in place 04/15/2023   Genetic testing 03/25/2023   Malignant neoplasm of upper-outer quadrant of right breast in female, estrogen receptor positive (HCC) 03/14/2023   Benign positional vertigo 10/06/2022   Esophageal dysphagia 08/10/2021   Hyperlipidemia, unspecified 07/29/2017    PCP: Curt Dover, MD  REFERRING PROVIDER: Cameron Cea, MD  REFERRING DIAG: C50.411,Z17.0 (ICD-10-CM) - Malignant neoplasm of upper-outer quadrant of right breast in female, estrogen receptor positive (HCC)  THERAPY DIAG:  Lymphedema, not elsewhere classified  Abnormal posture  Malignant neoplasm of upper-outer quadrant of right breast in female, estrogen receptor positive (HCC)  ONSET DATE: 01/12/24  Rationale for Evaluation and Treatment: Rehabilitation  SUBJECTIVE:  SUBJECTIVE STATEMENT: I got my new compression bra yesterday. It fits good and is very comfortable. Also had my mammogram check up yesterday and they scared me for a minute because I had to do an US  over that hard area but the doctor just thinks it's a small cyst. Nothing to worry over.   PERTINENT HISTORY: Patient was diagnosed on 02/28/2023 with right grade 2 invasive ductal carcinoma breast cancer. It measures 1.7 cm and is located in the upper outer quadrant. It is weakly ER positive, PR negative and HER2 negative with a Ki67 of 80%. She has a positive axillary lymph node. 07/05/23- R breast lumpectomy and SLNB 1/9   PAIN:  Are you having pain? No  PRECAUTIONS: Other: R UE lymphedema risk  RED FLAGS: None   WEIGHT BEARING RESTRICTIONS: No  FALLS:  Has patient fallen in last 6 months? No  LIVING ENVIRONMENT: Lives with: lives with their spouse Lives  in: House/apartment Stairs: No Has following equipment at home: Grab bars  OCCUPATION: retired  LEISURE: walking every day currently getting 7-8,000  HAND DOMINANCE: right   PRIOR LEVEL OF FUNCTION: Independent  PATIENT GOALS: to get the swelling and discomfort out of the R breast   OBJECTIVE: Note: Objective measures were completed at Evaluation unless otherwise noted.  COGNITION: Overall cognitive status: Within functional limits for tasks assessed   PALPATION: Increased fibrosis palpable in inferior breast, tightness palpable at R pec insertion  OBSERVATIONS / OTHER ASSESSMENTS: R breast with visible edema  POSTURE: forward head, rounded shoulders  UPPER EXTREMITY AROM/PROM: WFL but does have tightness at end range in R pec   LYMPHEDEMA ASSESSMENTS:   SURGERY TYPE/DATE: 07/05/23- R breast lumpectomy and SLNB  NUMBER OF LYMPH NODES REMOVED: 1/9   CHEMOTHERAPY: completed neoadjuvant  RADIATION:completed  HORMONE TREATMENT: none  INFECTIONS: none   LYMPHEDEMA ASSESSMENTS:   LANDMARK RIGHT  eval  At axilla  29  15 cm proximal to olecranon process 26  10 cm proximal to olecranon process 24.4  Olecranon process 21.2  15 cm proximal to ulnar styloid process 19.1  10 cm proximal to ulnar styloid process 17  Just proximal to ulnar styloid process 13.5  Across hand at thumb web space 15.5  At base of 2nd digit 5.4  (Blank rows = not tested)  LANDMARK LEFT  eval  At axilla  29  15 cm proximal to olecranon process 26  10 cm proximal to olecranon process 24.4  Olecranon process 21.5  15 cm proximal to ulnar styloid process 18.6  10 cm proximal to ulnar styloid process 16.8  Just proximal to ulnar styloid process 13.4  Across hand at thumb web space 15.5  At base of 2nd digit 5  (Blank rows = not tested)   BREAST COMPLAINTS QUESTIONNAIRE Pain: 1 Heaviness: 8 Swollen feeling: 3 Tense Skin: 6 Redness: 1  Bra Print: 1 Size of Pores: 0 Hard feeling:   5 Total:    25 /80 A Score over 9 indicates lymphedema issues in the breast  TREATMENT DATE:  03/01/24: Therapeutic Activities Supine over half foam roll for following: Bil UE horz abd x 15, bil UE scaption into a V x 12, then bil UE abd in a snow angel x 10 with 5 sec holds returning therapist demo for each and tactile cues during for correct UE position Doorway stretch for improved muscle flexibility and posture 5 x, 20 sec holds returning demo. Handout issued for new exs done today.  Manual Therapy MLD to Rt breast: Short neck, superficial and deep abdominals, Lt axillary and pectoral nodes, anterior intact thorax sequence, anterior inter-axillary anastomosis, then Rt inguinal nodes, Rt axillo-inguinal anastomosis, then focused on superior and medial breast to anterior pathway, then into Lt S/L for focus on inferior and lateral breast to lateral pathway; then finished retracing all steps in supine. Had pt return demo of anastomosis and reviewed correct directionality of anastomosis skin stretches as she was having some confusion.  P/ROM to Rt shoulder during MLD into flex and abd to pts tolerance and with scapular depression throughout by therapist STM to Rt lateral trunk where palpable tightness.   02/27/24: Self Care Spent time reviewing sequence of MLD and answering her questions about directionality of skin stretches and why. Manual Therapy MLD to Rt breast: Short neck, superficial and deep abdominals, Lt axillary and pectoral nodes, anterior intact thorax sequence, anterior inter-axillary anastomosis, then Rt inguinal nodes, Rt axillo-inguinal anastomosis, then focused on superior and medial breast to anterior pathway, then into Lt S/L for focus on inferior and lateral breast to lateral pathway; then finished retracing all steps in supine. Had pt return demo of  anastomosis.  P/ROM to Rt shoulder during MLD into flex and abd to pts tolerance and with scapular depression throughout by therapist STM to Rt lateral trunk where palpable tightness. Instructed pt in how she can do this at home when in end ROM Rt shoulder stretches  02/22/24: Self Care Issued post op handout to pt as she requested another copy. Spent time reviewing these with her by demo and explaining how she wants to feel a stretch, not pain. Also educated her on how she can incorporate stretches into her ADLs when reaching to a high shelf or doing wall slide stretches before she gets out of the shower when her muscles are looser from the warm water. Pt verbalized good understanding of all.  Manual Therapy MLD to Rt breast: Short neck, 5 diaphragmatic breaths, Lt axillary and pectoral nodes, anterior intact thorax sequence, anterior inter-axillary anastomosis (had pt return demo of anastomosis), then Rt inguinal nodes, Rt axillo-inguinal anastomosis (had pt return demo of this as well), then focused on superior and medial breast to anterior pathway, then into Lt S/L for focus on inferior and lateral breast to lateral pathway; then finished retracing all steps in supine. Also had pt return demo of breast sequences, she returned very good demo with correct pressure and skin stretch.  P/ROM to Rt shoulder during MLD into flex and abd to pts tolerance and with scapular depression throughout by therapist STM to Rt pect insertion where pt palpably tight, but less so today       PATIENT EDUCATION:  Education details: Rt UE stretches Person educated: Patient Education method: Explanation, demonstration, and VC's, handout issued Education comprehension: verbalized understanding  HOME EXERCISE PROGRAM: Wear compression bra and can use chip pack to increase compression and soften fibrosis Self MLD daily Post op Rt UE stretches Rt shoulder A/ROM stretches   ASSESSMENT:  CLINICAL IMPRESSION: Pt  received new WearEase bra yesterday and reports this very comfortable and is wearing it today. Progressed HEP to include A/ROM stretches over half foam roll for improved posture and Rt shoulder ROM. Then continued MLD and Rt shoulder ROM and STM. Reviewed technique of MLD, see above.    OBJECTIVE IMPAIRMENTS: decreased knowledge of condition, decreased knowledge of use of DME, increased edema, increased fascial restrictions, postural dysfunction, and pain.   ACTIVITY LIMITATIONS: carrying and lifting  PARTICIPATION LIMITATIONS: community activity  PERSONAL FACTORS: Time since onset of injury/illness/exacerbation are also affecting patient's functional outcome.   REHAB POTENTIAL: Good  CLINICAL DECISION MAKING: Stable/uncomplicated  EVALUATION COMPLEXITY: Low  GOALS: Goals reviewed with patient? Yes  SHORT TERM GOALS=LONG TERM GOALS Target date: 03/15/24  Pt will be independent in self MLD for long term management of lymphedema. Baseline: Goal status: INITIAL  2.  Pt will obtain appropriate compression bras for long term management of lymphedema. Baseline:  Goal status: INITIAL  3.  Pt will report a 50% improvement in feelings of heaviness and discomfort in R breast to allow improved comfort.  Baseline:  Goal status: INITIAL  4.  Pt will be independent in a home exercise program for pec stretching to allow improved comfort with end range R shoulder ROM.  Baseline:  Goal status: INITIAL   PLAN:  PT FREQUENCY: 2x/week  PT DURATION: 4 weeks  PLANNED INTERVENTIONS: 97164- PT Re-evaluation, 97110-Therapeutic exercises, 97530- Therapeutic activity, 97112- Neuromuscular re-education, 97535- Self Care, 08657- Manual therapy, [phone]- Orthotic Initial, and [phone]- Orthotic/Prosthetic subsequent  PLAN FOR NEXT SESSION: Cont and review MLD to R breast, and review new HEP stretches  Denyce Flank, PTA 03/01/2024, 11:03 AM  CHEST: Doorway, Bilateral -  Standing    Standing in doorway WITH ONE FOOT IN FRONT OF OTHER, place hands on wall with elbows bent at shoulder height. Lean forward. Hold _20-30__ seconds. _3-5__ reps per set, _2-3__ sets per day.    Lay over rolled up towels and bend knees:  With prayer hands starting pointing to ceiling, open arms wide to bed until stretch felt across chest, then bring arms back together. Then do a snow angel keeping the arms on the bed and slide them up until stretch felt keeping them on the bed the whole time.  Do these x 10 reps, with 5 sec holds   Cancer Rehab [phone]

## 2024-03-01 NOTE — Telephone Encounter (Signed)
 Per MD request RN placed call to pt with recent [employer] results being negative.  Pt educated and verbalized understanding.

## 2024-03-05 ENCOUNTER — Encounter: Payer: Self-pay | Admitting: Hematology and Oncology

## 2024-03-05 ENCOUNTER — Ambulatory Visit

## 2024-03-05 DIAGNOSIS — Z170 Estrogen receptor positive status [ER+]: Secondary | ICD-10-CM | POA: Diagnosis not present

## 2024-03-05 DIAGNOSIS — R293 Abnormal posture: Secondary | ICD-10-CM | POA: Diagnosis not present

## 2024-03-05 DIAGNOSIS — I890 Lymphedema, not elsewhere classified: Secondary | ICD-10-CM

## 2024-03-05 DIAGNOSIS — C50411 Malignant neoplasm of upper-outer quadrant of right female breast: Secondary | ICD-10-CM

## 2024-03-05 NOTE — Therapy (Signed)
 OUTPATIENT PHYSICAL THERAPY  UPPER EXTREMITY ONCOLOGYTREATMENT  Patient Name: MAGHAN JESSEE MRN: [id number] DOB:07-19-1951, 73 y.o., female Today's Date: 03/05/2024  END OF SESSION:  PT End of Session - 03/05/24 1109     Visit Number 6    Number of Visits 9    Date for PT Re-Evaluation 03/15/24    PT Start Time 1105    PT Stop Time 1202    PT Time Calculation (min) 57 min    Activity Tolerance Patient tolerated treatment well    Behavior During Therapy WFL for tasks assessed/performed          Past Medical History:  Diagnosis Date   GERD (gastroesophageal reflux disease)    Hyperlipidemia    Intracervical pessary    Unspecified disorder of bladder    Uterine prolapse    Past Surgical History:  Procedure Laterality Date   APPENDECTOMY  1990   bartholins cyst  1978   BREAST BIOPSY Left    benign   BREAST BIOPSY Right 03/04/2023   US  RT BREAST BX W LOC DEV 1ST LESION IMG BX SPEC US  GUIDE 03/04/2023 [facility]   BREAST BIOPSY  07/04/2023   US  RT RADIOACTIVE SEED LOC 07/04/2023 [facility]   BREAST BIOPSY  07/04/2023   MM RT RADIOACTIVE SEED LOC MAMMO GUIDE 07/04/2023 [facility]   BREAST LUMPECTOMY WITH RADIOACTIVE SEED AND AXILLARY LYMPH NODE DISSECTION Right 07/05/2023   Procedure: RADIOACTIVE SEED GUIDED RIGHT BREAST LUMPECTOMY AND TARGETED RIGHT AXILLARY LYMPH NODE DISSECTION;  Surgeon: Vernetta Berg, MD;  Location: [facility];  Service: General;  Laterality: Right;  LMA PEC BLOCK   COLONOSCOPY WITH PROPOFOL  N/A 09/27/2018   Procedure: COLONOSCOPY WITH PROPOFOL ;  Surgeon: Therisa Bi, MD;  Location: [facility];  Service: Gastroenterology;  Laterality: N/A;   IR IMAGING GUIDED PORT INSERTION  03/24/2023   PORT-A-CATH REMOVAL Left 07/05/2023   Procedure: REMOVAL PORT-A-CATH;  Surgeon: Vernetta Berg, MD;  Location: [facility];  Service: General;  Laterality: Left;   Patient Active Problem List   Diagnosis  Date Noted   Preventative health care 10/10/2023   Atrophic vaginitis 10/10/2023   Port-A-Cath in place 04/15/2023   Genetic testing 03/25/2023   Malignant neoplasm of upper-outer quadrant of right breast in female, estrogen receptor positive (HCC) 03/14/2023   Benign positional vertigo 10/06/2022   Esophageal dysphagia 08/10/2021   Hyperlipidemia, unspecified 07/29/2017    PCP: Charlie Denise, MD  REFERRING PROVIDER: Mackey Chad, MD  REFERRING DIAG: C50.411,Z17.0 (ICD-10-CM) - Malignant neoplasm of upper-outer quadrant of right breast in female, estrogen receptor positive (HCC)  THERAPY DIAG:  Lymphedema, not elsewhere classified  Abnormal posture  Malignant neoplasm of upper-outer quadrant of right breast in female, estrogen receptor positive (HCC)  ONSET DATE: 01/12/24  Rationale for Evaluation and Treatment: Rehabilitation  SUBJECTIVE:  SUBJECTIVE STATEMENT: My low back is flared up. I helped my sister some in the nursing home over the weekend and I think that did it.   PERTINENT HISTORY: Patient was diagnosed on 02/28/2023 with right grade 2 invasive ductal carcinoma breast cancer. It measures 1.7 cm and is located in the upper outer quadrant. It is weakly ER positive, PR negative and HER2 negative with a Ki67 of 80%. She has a positive axillary lymph node. 07/05/23- R breast lumpectomy and SLNB 1/9   PAIN:  PAIN:  Are you having pain? Yes NPRS scale: 6/10 Pain location: back Pain orientation: Lower  PAIN TYPE: aching and dull Pain description: constant  Aggravating factors: maybe my mattress and I helped change my sisters sheets at the nursing home this weekend Relieving factors: not sure   PRECAUTIONS: Other: R UE lymphedema risk  RED FLAGS: None   WEIGHT BEARING RESTRICTIONS:  No  FALLS:  Has patient fallen in last 6 months? No  LIVING ENVIRONMENT: Lives with: lives with their spouse Lives in: House/apartment Stairs: No Has following equipment at home: Grab bars  OCCUPATION: retired  LEISURE: walking every day currently getting 7-8,000  HAND DOMINANCE: right   PRIOR LEVEL OF FUNCTION: Independent  PATIENT GOALS: to get the swelling and discomfort out of the R breast   OBJECTIVE: Note: Objective measures were completed at Evaluation unless otherwise noted.  COGNITION: Overall cognitive status: Within functional limits for tasks assessed   PALPATION: Increased fibrosis palpable in inferior breast, tightness palpable at R pec insertion  OBSERVATIONS / OTHER ASSESSMENTS: R breast with visible edema  POSTURE: forward head, rounded shoulders  UPPER EXTREMITY AROM/PROM: WFL but does have tightness at end range in R pec   LYMPHEDEMA ASSESSMENTS:   SURGERY TYPE/DATE: 07/05/23- R breast lumpectomy and SLNB  NUMBER OF LYMPH NODES REMOVED: 1/9   CHEMOTHERAPY: completed neoadjuvant  RADIATION:completed  HORMONE TREATMENT: none  INFECTIONS: none   LYMPHEDEMA ASSESSMENTS:   LANDMARK RIGHT  eval  At axilla  29  15 cm proximal to olecranon process 26  10 cm proximal to olecranon process 24.4  Olecranon process 21.2  15 cm proximal to ulnar styloid process 19.1  10 cm proximal to ulnar styloid process 17  Just proximal to ulnar styloid process 13.5  Across hand at thumb web space 15.5  At base of 2nd digit 5.4  (Blank rows = not tested)  LANDMARK LEFT  eval  At axilla  29  15 cm proximal to olecranon process 26  10 cm proximal to olecranon process 24.4  Olecranon process 21.5  15 cm proximal to ulnar styloid process 18.6  10 cm proximal to ulnar styloid process 16.8  Just proximal to ulnar styloid process 13.4  Across hand at thumb web space 15.5  At base of 2nd digit 5  (Blank rows = not tested)   BREAST COMPLAINTS  QUESTIONNAIRE Pain: 1 Heaviness: 8 Swollen feeling: 3 Tense Skin: 6 Redness: 1  Bra Print: 1 Size of Pores: 0 Hard feeling:  5 Total:    25 /80 A Score over 9 indicates lymphedema issues in the breast  TREATMENT DATE:  03/05/24: Therapeutic Exercises Pulleys into flex and abd x 2 mins each Roll yellow ball up wall into flex and abd x 10 each returning therapist demo for each.  Did not do supine over foam today due to pts LBP Manual Therapy MLD to Rt breast: Short neck, superficial and deep abdominals, Lt axillary and pectoral nodes, anterior intact thorax sequence, anterior inter-axillary anastomosis, then Rt inguinal nodes, Rt axillo-inguinal anastomosis, then focused on superior and medial breast to anterior pathway, then into Lt S/L for focus on inferior and lateral breast to lateral pathway; then finished retracing all steps in supine. Continued with review of directionality of anastomosis, pt is beginning to verbalize better understanding  P/ROM to Rt shoulder during MLD into flex and abd to pts tolerance and with scapular depression throughout by therapist STM where pt was palpably tight but this is much improved today  03/01/24: Therapeutic Activities Supine over half foam roll for following: Bil UE horz abd x 15, bil UE scaption into a V x 12, then bil UE abd in a snow angel x 10 with 5 sec holds returning therapist demo for each and tactile cues during for correct UE position Doorway stretch for improved muscle flexibility and posture 5 x, 20 sec holds returning demo. Handout issued for new exs done today.  Manual Therapy MLD to Rt breast: Short neck, superficial and deep abdominals, Lt axillary and pectoral nodes, anterior intact thorax sequence, anterior inter-axillary anastomosis, then Rt inguinal nodes, Rt axillo-inguinal anastomosis, then focused on  superior and medial breast to anterior pathway, then into Lt S/L for focus on inferior and lateral breast to lateral pathway; then finished retracing all steps in supine. Had pt return demo of anastomosis and reviewed correct directionality of anastomosis skin stretches as she was having some confusion.  P/ROM to Rt shoulder during MLD into flex and abd to pts tolerance and with scapular depression throughout by therapist STM to Rt lateral trunk where palpable tightness.   02/27/24: Self Care Spent time reviewing sequence of MLD and answering her questions about directionality of skin stretches and why. Manual Therapy MLD to Rt breast: Short neck, superficial and deep abdominals, Lt axillary and pectoral nodes, anterior intact thorax sequence, anterior inter-axillary anastomosis, then Rt inguinal nodes, Rt axillo-inguinal anastomosis, then focused on superior and medial breast to anterior pathway, then into Lt S/L for focus on inferior and lateral breast to lateral pathway; then finished retracing all steps in supine. Had pt return demo of anastomosis.  P/ROM to Rt shoulder during MLD into flex and abd to pts tolerance and with scapular depression throughout by therapist STM to Rt lateral trunk where palpable tightness. Instructed pt in how she can do this at home when in end ROM Rt shoulder stretches       PATIENT EDUCATION:  Education details: Rt UE stretches Person educated: Patient Education method: Explanation, demonstration, and VC's, handout issued Education comprehension: verbalized understanding  HOME EXERCISE PROGRAM: Wear compression bra and can use chip pack to increase compression and soften fibrosis Self MLD daily Post op Rt UE stretches Rt shoulder A/ROM stretches   ASSESSMENT:  CLINICAL IMPRESSION: Held off on A/ROM stretches over foam roll as pt reports increased LBP today. Instead did AA/ROM stretches with pulleys and ball roll up wall. Then continued with MLD and  review of this while performing. Pt feels she may be ready for D/C next per POC.    OBJECTIVE IMPAIRMENTS: decreased knowledge of condition, decreased knowledge of  use of DME, increased edema, increased fascial restrictions, postural dysfunction, and pain.   ACTIVITY LIMITATIONS: carrying and lifting  PARTICIPATION LIMITATIONS: community activity  PERSONAL FACTORS: Time since onset of injury/illness/exacerbation are also affecting patient's functional outcome.   REHAB POTENTIAL: Good  CLINICAL DECISION MAKING: Stable/uncomplicated  EVALUATION COMPLEXITY: Low  GOALS: Goals reviewed with patient? Yes  SHORT TERM GOALS=LONG TERM GOALS Target date: 03/15/24  Pt will be independent in self MLD for long term management of lymphedema. Baseline: Goal status: INITIAL  2.  Pt will obtain appropriate compression bras for long term management of lymphedema. Baseline:  Goal status: INITIAL  3.  Pt will report a 50% improvement in feelings of heaviness and discomfort in R breast to allow improved comfort.  Baseline:  Goal status: INITIAL  4.  Pt will be independent in a home exercise program for pec stretching to allow improved comfort with end range R shoulder ROM.  Baseline:  Goal status: INITIAL   PLAN:  PT FREQUENCY: 2x/week  PT DURATION: 4 weeks  PLANNED INTERVENTIONS: 97164- PT Re-evaluation, 97110-Therapeutic exercises, 97530- Therapeutic activity, [id number]- Neuromuscular re-education, 97535- Self Care, [id number]- Manual therapy, 97760- Orthotic Initial, and [id number]- Orthotic/Prosthetic subsequent  PLAN FOR NEXT SESSION:  Review goals and possible D/C next. Cont and review MLD to R breast, and review new HEP stretches  Aden Berwyn Caldron, PTA 03/05/2024, 12:06 PM  CHEST: Doorway, Bilateral - Standing    Standing in doorway WITH ONE FOOT IN FRONT OF OTHER, place hands on wall with elbows bent at shoulder height. Lean forward. Hold _20-30__ seconds. _3-5__ reps per set,  _2-3__ sets per day.    Lay over rolled up towels and bend knees:  With prayer hands starting pointing to ceiling, open arms wide to bed until stretch felt across chest, then bring arms back together. Then do a snow angel keeping the arms on the bed and slide them up until stretch felt keeping them on the bed the whole time.  Do these x 10 reps, with 5 sec holds   Cancer Rehab [phone]

## 2024-03-07 ENCOUNTER — Ambulatory Visit

## 2024-03-07 DIAGNOSIS — I890 Lymphedema, not elsewhere classified: Secondary | ICD-10-CM | POA: Diagnosis not present

## 2024-03-07 DIAGNOSIS — C50411 Malignant neoplasm of upper-outer quadrant of right female breast: Secondary | ICD-10-CM | POA: Diagnosis not present

## 2024-03-07 DIAGNOSIS — R293 Abnormal posture: Secondary | ICD-10-CM

## 2024-03-07 DIAGNOSIS — Z170 Estrogen receptor positive status [ER+]: Secondary | ICD-10-CM | POA: Diagnosis not present

## 2024-03-07 MED ORDER — OMEPRAZOLE 20 MG PO CPDR
20.0000 mg | DELAYED_RELEASE_CAPSULE | ORAL | 1 refills | Status: DC
Start: 2024-03-07 — End: 2024-08-08

## 2024-03-07 NOTE — Addendum Note (Signed)
 Addended by: Doneen Ollinger L on: 03/07/2024 09:32 AM   Modules accepted: Orders

## 2024-03-07 NOTE — Therapy (Signed)
 OUTPATIENT PHYSICAL THERAPY  UPPER EXTREMITY ONCOLOGYTREATMENT  Patient Name: Heidi Mitchell MRN: [id number] DOB:28-May-1951, 73 y.o., female Today's Date: 03/07/2024  END OF SESSION:  PT End of Session - 03/07/24 1211     Visit Number 7    Number of Visits 9    Date for PT Re-Evaluation 03/15/24    PT Start Time 1206    PT Stop Time 1304    PT Time Calculation (min) 58 min    Activity Tolerance Patient tolerated treatment well    Behavior During Therapy WFL for tasks assessed/performed          Past Medical History:  Diagnosis Date   GERD (gastroesophageal reflux disease)    Hyperlipidemia    Intracervical pessary    Unspecified disorder of bladder    Uterine prolapse    Past Surgical History:  Procedure Laterality Date   APPENDECTOMY  1990   bartholins cyst  1978   BREAST BIOPSY Left    benign   BREAST BIOPSY Right 03/04/2023   US  RT BREAST BX W LOC DEV 1ST LESION IMG BX SPEC US  GUIDE 03/04/2023 [facility]   BREAST BIOPSY  07/04/2023   US  RT RADIOACTIVE SEED LOC 07/04/2023 [facility]   BREAST BIOPSY  07/04/2023   MM RT RADIOACTIVE SEED LOC MAMMO GUIDE 07/04/2023 [facility]   BREAST LUMPECTOMY WITH RADIOACTIVE SEED AND AXILLARY LYMPH NODE DISSECTION Right 07/05/2023   Procedure: RADIOACTIVE SEED GUIDED RIGHT BREAST LUMPECTOMY AND TARGETED RIGHT AXILLARY LYMPH NODE DISSECTION;  Surgeon: Vernetta Berg, MD;  Location: [facility];  Service: General;  Laterality: Right;  LMA PEC BLOCK   COLONOSCOPY WITH PROPOFOL  N/A 09/27/2018   Procedure: COLONOSCOPY WITH PROPOFOL ;  Surgeon: Therisa Bi, MD;  Location: [facility];  Service: Gastroenterology;  Laterality: N/A;   IR IMAGING GUIDED PORT INSERTION  03/24/2023   PORT-A-CATH REMOVAL Left 07/05/2023   Procedure: REMOVAL PORT-A-CATH;  Surgeon: Vernetta Berg, MD;  Location: [facility];  Service: General;  Laterality: Left;   Patient Active Problem List   Diagnosis  Date Noted   Preventative health care 10/10/2023   Atrophic vaginitis 10/10/2023   Port-A-Cath in place 04/15/2023   Genetic testing 03/25/2023   Malignant neoplasm of upper-outer quadrant of right breast in female, estrogen receptor positive (HCC) 03/14/2023   Benign positional vertigo 10/06/2022   Esophageal dysphagia 08/10/2021   Hyperlipidemia, unspecified 07/29/2017    PCP: Charlie Denise, MD  REFERRING PROVIDER: Mackey Chad, MD  REFERRING DIAG: C50.411,Z17.0 (ICD-10-CM) - Malignant neoplasm of upper-outer quadrant of right breast in female, estrogen receptor positive (HCC)  THERAPY DIAG:  Lymphedema, not elsewhere classified  Abnormal posture  Malignant neoplasm of upper-outer quadrant of right breast in female, estrogen receptor positive (HCC)  ONSET DATE: 01/12/24  Rationale for Evaluation and Treatment: Rehabilitation  SUBJECTIVE:  SUBJECTIVE STATEMENT: My back is doing better today. I've been doing the stretches and they are going well also. I think I'm ready to make today my last visit. I know I can call my doctor if I want to come back.   PERTINENT HISTORY: Patient was diagnosed on 02/28/2023 with right grade 2 invasive ductal carcinoma breast cancer. It measures 1.7 cm and is located in the upper outer quadrant. It is weakly ER positive, PR negative and HER2 negative with a Ki67 of 80%. She has a positive axillary lymph node. 07/05/23- R breast lumpectomy and SLNB 1/9   PAIN:  PAIN:  Are you having pain? No, not currently   PRECAUTIONS: Other: R UE lymphedema risk  RED FLAGS: None   WEIGHT BEARING RESTRICTIONS: No  FALLS:  Has patient fallen in last 6 months? No  LIVING ENVIRONMENT: Lives with: lives with their spouse Lives in: House/apartment Stairs: No Has following  equipment at home: Grab bars  OCCUPATION: retired  LEISURE: walking every day currently getting 7-8,000  HAND DOMINANCE: right   PRIOR LEVEL OF FUNCTION: Independent  PATIENT GOALS: to get the swelling and discomfort out of the R breast   OBJECTIVE: Note: Objective measures were completed at Evaluation unless otherwise noted.  COGNITION: Overall cognitive status: Within functional limits for tasks assessed   PALPATION: Increased fibrosis palpable in inferior breast, tightness palpable at R pec insertion  OBSERVATIONS / OTHER ASSESSMENTS: R breast with visible edema  POSTURE: forward head, rounded shoulders  UPPER EXTREMITY AROM/PROM: WFL but does have tightness at end range in R pec   LYMPHEDEMA ASSESSMENTS:   SURGERY TYPE/DATE: 07/05/23- R breast lumpectomy and SLNB  NUMBER OF LYMPH NODES REMOVED: 1/9   CHEMOTHERAPY: completed neoadjuvant  RADIATION:completed  HORMONE TREATMENT: none  INFECTIONS: none   LYMPHEDEMA ASSESSMENTS:   LANDMARK RIGHT  eval  At axilla  29  15 cm proximal to olecranon process 26  10 cm proximal to olecranon process 24.4  Olecranon process 21.2  15 cm proximal to ulnar styloid process 19.1  10 cm proximal to ulnar styloid process 17  Just proximal to ulnar styloid process 13.5  Across hand at thumb web space 15.5  At base of 2nd digit 5.4  (Blank rows = not tested)  LANDMARK LEFT  eval  At axilla  29  15 cm proximal to olecranon process 26  10 cm proximal to olecranon process 24.4  Olecranon process 21.5  15 cm proximal to ulnar styloid process 18.6  10 cm proximal to ulnar styloid process 16.8  Just proximal to ulnar styloid process 13.4  Across hand at thumb web space 15.5  At base of 2nd digit 5  (Blank rows = not tested)   BREAST COMPLAINTS QUESTIONNAIRE Pain: 1 Heaviness: 8 Swollen feeling: 3 Tense Skin: 6 Redness: 1  Bra Print: 1 Size of Pores: 0 Hard feeling:  5 Total:    25 /80 A Score over 9 indicates  lymphedema issues in the breast  TREATMENT DATE:  03/07/24: Therapeutic Exercises Pulleys into flex and abd x 2 mins each Roll yellow ball up wall into flex and abd x 10 each Therapeutic Activities Supine over half foam roll for following: Bil UE horz abd x 15, bil UE scaption into a V x 12, then bil UE abd in a snow angel x 10 with 5 sec holds returning therapist demo for each and tactile cues during for correct UE position Doorway stretch for improved muscle flexibility and posture 5 x, 20 sec holds returning demo. Manual Therapy MLD to Rt breast: Short neck, superficial and deep abdominals, Lt axillary and pectoral nodes, anterior intact thorax sequence, anterior inter-axillary anastomosis, then Rt inguinal nodes, Rt axillo-inguinal anastomosis, then focused on superior and medial breast to anterior pathway, then into Lt S/L for focus on inferior and lateral breast to lateral pathway; then finished retracing all steps in supine.  P/ROM to Rt shoulder during MLD into flex and abd to pts tolerance and with scapular depression throughout by therapist  03/05/24: Therapeutic Exercises Pulleys into flex and abd x 2 mins each Roll yellow ball up wall into flex and abd x 10 each returning therapist demo for each.  Did not do supine over foam today due to pts LBP Manual Therapy MLD to Rt breast: Short neck, superficial and deep abdominals, Lt axillary and pectoral nodes, anterior intact thorax sequence, anterior inter-axillary anastomosis, then Rt inguinal nodes, Rt axillo-inguinal anastomosis, then focused on superior and medial breast to anterior pathway, then into Lt S/L for focus on inferior and lateral breast to lateral pathway; then finished retracing all steps in supine. Continued with review of directionality of anastomosis, pt is beginning to verbalize better  understanding  P/ROM to Rt shoulder during MLD into flex and abd to pts tolerance and with scapular depression throughout by therapist STM where pt was palpably tight but this is much improved today  03/01/24: Therapeutic Activities Supine over half foam roll for following: Bil UE horz abd x 15, bil UE scaption into a V x 12, then bil UE abd in a snow angel x 10 with 5 sec holds returning therapist demo for each and tactile cues during for correct UE position Doorway stretch for improved muscle flexibility and posture 5 x, 20 sec holds returning demo. Handout issued for new exs done today.  Manual Therapy MLD to Rt breast: Short neck, superficial and deep abdominals, Lt axillary and pectoral nodes, anterior intact thorax sequence, anterior inter-axillary anastomosis, then Rt inguinal nodes, Rt axillo-inguinal anastomosis, then focused on superior and medial breast to anterior pathway, then into Lt S/L for focus on inferior and lateral breast to lateral pathway; then finished retracing all steps in supine. Had pt return demo of anastomosis and reviewed correct directionality of anastomosis skin stretches as she was having some confusion.  P/ROM to Rt shoulder during MLD into flex and abd to pts tolerance and with scapular depression throughout by therapist STM to Rt lateral trunk where palpable tightness.        PATIENT EDUCATION:  Education details: Rt UE stretches Person educated: Patient Education method: Explanation, demonstration, and VC's, handout issued Education comprehension: verbalized understanding  HOME EXERCISE PROGRAM: Wear compression bra and can use chip pack to increase compression and soften fibrosis Self MLD daily Post op Rt UE stretches Rt shoulder A/ROM stretches   ASSESSMENT:  CLINICAL IMPRESSION: Pt has done well this episode of care and is ready for D/C today. All goals met. She knows if she  needs to return to this clinic she just needs to reach back out to  her doctor for a new referral.    OBJECTIVE IMPAIRMENTS: decreased knowledge of condition, decreased knowledge of use of DME, increased edema, increased fascial restrictions, postural dysfunction, and pain.   ACTIVITY LIMITATIONS: carrying and lifting  PARTICIPATION LIMITATIONS: community activity  PERSONAL FACTORS: Time since onset of injury/illness/exacerbation are also affecting patient's functional outcome.   REHAB POTENTIAL: Good  CLINICAL DECISION MAKING: Stable/uncomplicated  EVALUATION COMPLEXITY: Low  GOALS: Goals reviewed with patient? Yes  SHORT TERM GOALS=LONG TERM GOALS Target date: 03/15/24  Pt will be independent in self MLD for long term management of lymphedema. Baseline: Goal status: MET  2.  Pt will obtain appropriate compression bras for long term management of lymphedema. Baseline:  Goal status: MET  3.  Pt will report a 50% improvement in feelings of heaviness and discomfort in R breast to allow improved comfort.  Baseline: 03/07/24 - Pt reports 60% improvement  Goal status: MET  4.  Pt will be independent in a home exercise program for pec stretching to allow improved comfort with end range R shoulder ROM.  Baseline:  Goal status: MET   PLAN:  PT FREQUENCY: 2x/week  PT DURATION: 4 weeks  PLANNED INTERVENTIONS: 97164- PT Re-evaluation, 97110-Therapeutic exercises, 97530- Therapeutic activity, [id number]- Neuromuscular re-education, 97535- Self Care, [id number]- Manual therapy, 97760- Orthotic Initial, and [id number]- Orthotic/Prosthetic subsequent  PLAN FOR NEXT SESSION:  D/C this visit.   Aden Berwyn Caldron, PTA 03/07/2024, 1:18 PM  CHEST: Doorway, Bilateral - Standing    Standing in doorway WITH ONE FOOT IN FRONT OF OTHER, place hands on wall with elbows bent at shoulder height. Lean forward. Hold _20-30__ seconds. _3-5__ reps per set, _2-3__ sets per day.    Lay over rolled up towels and bend knees:  With prayer hands starting pointing to  ceiling, open arms wide to bed until stretch felt across chest, then bring arms back together. Then do a snow angel keeping the arms on the bed and slide them up until stretch felt keeping them on the bed the whole time.  Do these x 10 reps, with 5 sec holds   [facility] [phone]  PHYSICAL THERAPY DISCHARGE SUMMARY  Visits from Start of Care: 7  Current functional level related to goals / functional outcomes: All goals met   Remaining deficits: None   Education / Equipment: HEP, MLD, compression garments   Patient agrees to discharge. Patient goals were met. Patient is being discharged due to meeting the stated rehab goals.  Heidi Mitchell, [place] 03/08/24 9:45 AM

## 2024-03-07 NOTE — Telephone Encounter (Unsigned)
 Copied from CRM [id number]. Topic: Clinical - Medication Refill >> Mar 07, 2024  9:17 AM Deaijah H wrote: Medication: omeprazole  (PRILOSEC) 20 MG capsule    Has the patient contacted their pharmacy? Yes (Agent: If no, request that the patient contact the pharmacy for the refill. If patient does not wish to contact the pharmacy document the reason why and proceed with request.) (Agent: If yes, when and what did the pharmacy advise?) - Didn't have it on file  This is the patient's preferred pharmacy:  [facility] - [place], [place] - [address] [employer] [address] Phone: [phone] Fax: [phone]   Is this the correct pharmacy for this prescription? Yes If no, delete pharmacy and type the correct one.   Has the prescription been filled recently? No  Is the patient out of the medication? No  Has the patient been seen for an appointment in the last year OR does the patient have an upcoming appointment? No  Can we respond through MyChart? No  Agent: Please be advised that Rx refills may take up to 3 business days. We ask that you follow-up with your pharmacy.

## 2024-03-08 ENCOUNTER — Inpatient Hospital Stay: Admitting: Adult Health

## 2024-03-13 ENCOUNTER — Telehealth: Payer: Self-pay | Admitting: *Deleted

## 2024-03-13 NOTE — Telephone Encounter (Signed)
 Patient called to report concern about balance billing for home blood test that she received a denial letter from her insurance on.    Per Nurse Val we are instructed by [employer] that the balances will be absorbed and patients will not be balanced billed.  Patient was very thankful and stated understanding.

## 2024-03-20 ENCOUNTER — Inpatient Hospital Stay: Attending: Hematology and Oncology | Admitting: Adult Health

## 2024-03-20 ENCOUNTER — Encounter: Payer: Self-pay | Admitting: Adult Health

## 2024-03-20 VITALS — BP 124/55 | HR 61 | Temp 98.4°F | Resp 18 | Wt 118.7 lb

## 2024-03-20 DIAGNOSIS — M8589 Other specified disorders of bone density and structure, multiple sites: Secondary | ICD-10-CM | POA: Diagnosis not present

## 2024-03-20 DIAGNOSIS — Z1211 Encounter for screening for malignant neoplasm of colon: Secondary | ICD-10-CM | POA: Diagnosis not present

## 2024-03-20 DIAGNOSIS — C50411 Malignant neoplasm of upper-outer quadrant of right female breast: Secondary | ICD-10-CM | POA: Diagnosis not present

## 2024-03-20 DIAGNOSIS — Z923 Personal history of irradiation: Secondary | ICD-10-CM | POA: Insufficient documentation

## 2024-03-20 DIAGNOSIS — Z9221 Personal history of antineoplastic chemotherapy: Secondary | ICD-10-CM | POA: Insufficient documentation

## 2024-03-20 DIAGNOSIS — I890 Lymphedema, not elsewhere classified: Secondary | ICD-10-CM | POA: Diagnosis not present

## 2024-03-20 DIAGNOSIS — K219 Gastro-esophageal reflux disease without esophagitis: Secondary | ICD-10-CM | POA: Insufficient documentation

## 2024-03-20 DIAGNOSIS — Z170 Estrogen receptor positive status [ER+]: Secondary | ICD-10-CM | POA: Diagnosis not present

## 2024-03-20 DIAGNOSIS — Z1732 Human epidermal growth factor receptor 2 negative status: Secondary | ICD-10-CM | POA: Diagnosis not present

## 2024-03-20 DIAGNOSIS — Z79899 Other long term (current) drug therapy: Secondary | ICD-10-CM | POA: Diagnosis not present

## 2024-03-20 DIAGNOSIS — E785 Hyperlipidemia, unspecified: Secondary | ICD-10-CM | POA: Diagnosis not present

## 2024-03-20 DIAGNOSIS — Z1722 Progesterone receptor negative status: Secondary | ICD-10-CM | POA: Diagnosis not present

## 2024-03-20 DIAGNOSIS — Z803 Family history of malignant neoplasm of breast: Secondary | ICD-10-CM | POA: Diagnosis not present

## 2024-03-20 NOTE — Progress Notes (Unsigned)
 SURVIVORSHIP VISIT:  BRIEF ONCOLOGIC HISTORY:  Oncology History  Malignant neoplasm of upper-outer quadrant of right breast in female, estrogen receptor positive (HCC)  03/04/2023 Initial Diagnosis   Palpable right breast lump UOQ 12 o'clock position: 1.7 cm by ultrasound, 3 abnormal lymph nodes: 1 biopsy positive with extranodal extension.  Biopsy of the lump: Grade 2 IDC with medullary features ER 60% weak, PR 0%, Ki-67 80%, HER2 negative   03/16/2023 Cancer Staging   Staging form: Breast, AJCC 8th Edition - Clinical: Stage IIA (cT1c, cN1, cM0, G2, ER+, PR-, HER2-) - Signed by Odean Potts, MD on 03/16/2023 Stage prefix: Initial diagnosis Histologic grading system: 3 grade system   03/24/2023 Genetic Testing   Negative [facility] Common Hereditary Cancers +RNA Panel.  Report date is 03/24/2023.    The [facility] Common Hereditary Cancers + RNA Panel includes sequencing, deletion/duplication, and RNA analysis of the following 48 genes: APC, ATM, AXIN2, BAP1, BARD1, BMPR1A, BRCA1, BRCA2, BRIP1, CDH1, CDK4*, CDKN2A*, CHEK2, CTNNA1, DICER1, EPCAM* (del/dup only), FH, GREM1* (promoter dup analysis only), HOXB13*, KIT*, MBD4*, MEN1, MLH1, MSH2, MSH3, MSH6, MUTYH, NF1, NTHL1, PALB2, PDGFRA*, PMS2, POLD1, POLE, PTEN, RAD51C, RAD51D, SDHA (sequencing only), SDHB, SDHC, SDHD, SMAD4, SMARCA4, STK11, TP53, TSC1, TSC2, VHL.  *Genes without RNA analysis.     03/25/2023 - 05/26/2023 Chemotherapy   Patient is on Treatment Plan : BREAST TC q21d x 4 cycles     07/05/2023 Surgery   Right lumpectomy: Grade 2 IDC 1.5 cm, ER 60%, PR 0%, HER2 0, Ki67 80%, 1/9 lymph nodes positive (repeat prognostic panel: ER 0%, PR 0%, Ki67 30%, HER2 1+)    08/02/2023 Cancer Staging   Staging form: Breast, AJCC 8th Edition - Pathologic stage from 08/02/2023: ypT1c, pN1a, cM0, G2, ER-, PR-, HER2- - Signed by Izell Domino, MD on 08/22/2023 Stage prefix: Post-therapy Response to neoadjuvant therapy: Partial response Method of lymph node  assessment: Axillary lymph node dissection Multigene prognostic tests performed: Other Histologic grading system: 3 grade system   08/16/2023 - 09/30/2023 Radiation Therapy   Plan Name: Breast_R Site: Breast, Right Technique: 3D Mode: Photon Dose Per Fraction: 2 Gy Prescribed Dose (Delivered / Prescribed): 50 Gy / 50 Gy Prescribed Fxs (Delivered / Prescribed): 25 / 25   Plan Name: Breast_R_PAB_ Site: Breast, Right Technique: 3D Mode: Photon Dose Per Fraction: 2 Gy Prescribed Dose (Delivered / Prescribed): 50 Gy / 50 Gy Prescribed Fxs (Delivered / Prescribed): 25 / 25   Plan Name: Breast_R_Bst Site: Breast, Right Technique: 3D Mode: Photon Dose Per Fraction: 2 Gy Prescribed Dose (Delivered / Prescribed): 10 Gy / 10 Gy Prescribed Fxs (Delivered / Prescribed): 5 / 5     10/03/2023 -  Adjuvant Chemotherapy   Capecitabine  BID 14 days on and 7 days off--repeated every 21 days.      INTERVAL HISTORY:  Heidi Mitchell to review her survivorship care plan detailing her treatment course for breast cancer, as well as monitoring long-term side effects of that treatment, education regarding health maintenance, screening, and overall wellness and health promotion.     Overall, Heidi Mitchell reports feeling quite well.  Her current biggest struggle is breast lymphedema and she is working with PT and wearing compression bra.  She notes she was never able to take Xeloda  due to it making her too sick.   REVIEW OF SYSTEMS:  Review of Systems  Constitutional:  Positive for fatigue. Negative for appetite change, chills, fever and unexpected weight change.  HENT:   Negative for hearing loss, lump/mass and  trouble swallowing.   Eyes:  Negative for eye problems and icterus.  Respiratory:  Negative for chest tightness, cough and shortness of breath.   Cardiovascular:  Negative for chest pain, leg swelling and palpitations.  Gastrointestinal:  Negative for abdominal distention, abdominal pain,  constipation, diarrhea, nausea and vomiting.  Endocrine: Negative for hot flashes.  Genitourinary:  Negative for difficulty urinating.   Musculoskeletal:  Negative for arthralgias.  Skin:  Negative for itching and rash.  Neurological:  Negative for dizziness, extremity weakness, headaches and numbness.  Hematological:  Negative for adenopathy. Does not bruise/bleed easily.  Psychiatric/Behavioral:  Negative for depression. The patient is not nervous/anxious.    Breast: Denies any new nodularity, masses, tenderness, nipple changes, or nipple discharge.       PAST MEDICAL/SURGICAL HISTORY:  Past Medical History:  Diagnosis Date   GERD (gastroesophageal reflux disease)    Hyperlipidemia    Intracervical pessary    Unspecified disorder of bladder    Uterine prolapse    Past Surgical History:  Procedure Laterality Date   APPENDECTOMY  1990   bartholins cyst  1978   BREAST BIOPSY Left    benign   BREAST BIOPSY Right 03/04/2023   US  RT BREAST BX W LOC DEV 1ST LESION IMG BX SPEC US  GUIDE 03/04/2023 [facility]   BREAST BIOPSY  07/04/2023   US  RT RADIOACTIVE SEED LOC 07/04/2023 [facility]MO[facility]   BREAST BIOPSY  07/04/2023   MM RT RADIOACTIVE SEED LOC MAMMO GUIDE 07/04/2023 [facility]   BREAST LUMPECTOMY WITH RADIOACTIVE SEED AND AXILLARY LYMPH NODE DISSECTION Right 07/05/2023   Procedure: RADIOACTIVE SEED GUIDED RIGHT BREAST LUMPECTOMY AND TARGETED RIGHT AXILLARY LYMPH NODE DISSECTION;  Surgeon: Vernetta Berg, MD;  Location: [facility];  Service: General;  Laterality: Right;  LMA PEC BLOCK   COLONOSCOPY WITH PROPOFOL  N/A 09/27/2018   Procedure: COLONOSCOPY WITH PROPOFOL ;  Surgeon: Therisa Bi, MD;  Location: [facility];  Service: Gastroenterology;  Laterality: N/A;   IR IMAGING GUIDED PORT INSERTION  03/24/2023   PORT-A-CATH REMOVAL Left 07/05/2023   Procedure: REMOVAL PORT-A-CATH;  Surgeon: Vernetta Berg, MD;  Location: [facility];  Service: General;  Laterality: Left;     ALLERGIES:  Allergies  Allergen Reactions   Sertraline  Hcl     Muscle spasms   Excedrin Tension Headache [Acetaminophen -Caffeine] Anxiety     CURRENT MEDICATIONS:  Outpatient Encounter Medications as of 03/20/2024  Medication Sig   omeprazole  (PRILOSEC) 20 MG capsule Take 1 capsule (20 mg total) by mouth every other day. On an empty stomach   No facility-administered encounter medications on file as of 03/20/2024.     ONCOLOGIC FAMILY HISTORY:  Family History  Problem Relation Age of Onset   Alcohol abuse Mother    Cervical cancer Sister 23   Breast cancer Maternal Aunt        dx 52s   Other Son        hole in his brain due to been dropped when he was born     SOCIAL HISTORY:  Social History   Socioeconomic History   Marital status: Married    Spouse name: Guillermina   Number of children: 1   Years of education: high school   Highest education level: Not on file  Occupational History   Not on file  Tobacco Use   Smoking status: Never    Passive exposure: Current   Smokeless tobacco: Never  Vaping Use   Vaping status: Never Used  Substance  and Sexual Activity   Alcohol use: No   Drug use: No   Sexual activity: Not Currently    Birth control/protection: Abstinence  Other Topics Concern   Not on file  Social History Narrative   Does not have living will   Husband and son aware of wishes.   Pt would desires CPR but would not want prolonged life support if futile.   03/06/19   From: the area   Living: husband, Guillermina   Work: retired, but finances OK      Family: Son, Evalene who lives nearby, 2 grandchildren (7 and 47 yo), and a Physicist, medical on the way                 Social Drivers of Health   Financial Resource Strain: Low Risk  (08/31/2022)   Overall Financial Resource Strain (CARDIA)    Difficulty of Paying Living Expenses: Not hard at all  Food Insecurity: No Food Insecurity (08/02/2023)    Hunger Vital Sign    Worried About Running Out of Food in the Last Year: Never true    Ran Out of Food in the Last Year: Never true  Transportation Needs: No Transportation Needs (08/02/2023)   PRAPARE - Administrator, Civil Service (Medical): No    Lack of Transportation (Non-Medical): No  Physical Activity: Insufficiently Active (08/31/2022)   Exercise Vital Sign    Days of Exercise per Week: 7 days    Minutes of Exercise per Session: 20 min  Stress: No Stress Concern Present (08/31/2022)   [employer] of Occupational Health - Occupational Stress Questionnaire    Feeling of Stress : Not at all  Social Connections: Moderately Integrated (08/27/2021)   Social Connection and Isolation Panel    Frequency of Communication with Friends and Family: More than three times a week    Frequency of Social Gatherings with Friends and Family: More than three times a week    Attends Religious Services: 1 to 4 times per year    Active Member of [employer] or Organizations: No    Attends Banker Meetings: Never    Marital Status: Married  Catering manager Violence: Not At Risk (08/02/2023)   Humiliation, Afraid, Rape, and Kick questionnaire    Fear of Current or Ex-Partner: No    Emotionally Abused: No    Physically Abused: No    Sexually Abused: No     OBSERVATIONS/OBJECTIVE:  BP (!) 124/55 (BP Location: Left Arm, Patient Position: Sitting)   Pulse 61   Temp 98.4 F (36.9 C) (Temporal)   Resp 18   Wt 118 lb 11.2 oz (53.8 kg)   LMP 11/10/2012 (LMP Unknown)   SpO2 99%   BMI 23.97 kg/m  GENERAL: Patient is a well appearing female in no acute distress HEENT:  Sclerae anicteric.  Oropharynx clear and moist. No ulcerations or evidence of oropharyngeal candidiasis. Neck is supple.  NODES:  No cervical, supraclavicular, or axillary lymphadenopathy palpated.  BREAST EXAM: Right breast s/p lumpectomy and radiation, no sign of local recurrence; left breast  benign LUNGS:  Clear to auscultation bilaterally.  No wheezes or rhonchi. HEART:  Regular rate and rhythm. No murmur appreciated. ABDOMEN:  Soft, nontender.  Positive, normoactive bowel sounds. No organomegaly palpated. MSK:  No focal spinal tenderness to palpation. Full range of motion bilaterally in the upper extremities. EXTREMITIES:  No peripheral edema.   SKIN:  Clear with no obvious rashes or skin changes. No nail dyscrasia. NEURO:  Nonfocal.  Well oriented.  Appropriate affect.   LABORATORY DATA:  None for this visit.  DIAGNOSTIC IMAGING:  None for this visit.      ASSESSMENT AND PLAN:  Heidi Mitchell is a pleasant 73 y.o. female with Stage IIA right breast invasive ductal carcinoma, ER-/PR-/HER2-, diagnosed in 02/2023, treated with one cycle of chemotherapy (unable to tolerate), lumpectomy, adjuvant radiation therapy, and was unable to tolerate adjuvant capecitabine .  She presents to the Survivorship Clinic for our initial meeting and routine follow-up post-completion of treatment for breast cancer.    1. Stage IIA right breast cancer:  Heidi Mitchell is continuing to recover from definitive treatment for breast cancer. She will follow-up with her medical oncologist, Dr.  Gudena in 11/2024 with history and physical exam per surveillance protocol. . Her mammogram is due 02/2025.  Guardant reveal blood testing was ordered today as well.   Today, a comprehensive survivorship care plan and treatment summary was reviewed with the patient today detailing her breast cancer diagnosis, treatment course, potential late/long-term effects of treatment, appropriate follow-up care with recommendations for the future, and patient education resources.  A copy of this summary, along with a letter will be sent to the patient's primary care provider via mail/fax/In Basket message after today's visit.    2. Breast lymphedema: Continue wearing compression bra and f/u with PT.    3. Bone health:  She is due for  DEXA testing; orders placed today.  She was given education on specific activities to promote bone health.  4. Cancer screening:  Due to Heidi Mitchell history and her age, she should receive screening for skin cancers, colon cancer, and gynecologic cancers.  She is overdue for colon cancer screening; referral placed to GI. The information and recommendations are listed on the patient's comprehensive care plan/treatment summary and were reviewed in detail with the patient.    5. Health maintenance and wellness promotion: Heidi Mitchell was encouraged to consume 5-7 servings of fruits and vegetables per day. We reviewed the Nutrition Rainbow handout.  She was also encouraged to engage in moderate to vigorous exercise for 30 minutes per day most days of the week.  She was instructed to limit her alcohol consumption and continue to abstain from tobacco use.     6. Support services/counseling: It is not uncommon for this period of the patient's cancer care trajectory to be one of many emotions and stressors.   She was given information regarding our available services and encouraged to contact me with any questions or for help enrolling in any of our support group/programs.    Follow up instructions:    -Return to cancer center in 3/20206 for f/u with Dr. Odean  -Mammogram due in 02/2025 - DEXA ordered - Guardant reveal testing every 6 months - Referral to GI for colonoscopy -She is welcome to return back to the Survivorship Clinic at any time; no additional follow-up needed at this time.  -Consider referral back to survivorship as a long-term survivor for continued surveillance  The patient was provided an opportunity to ask questions and all were answered. The patient agreed with the plan and demonstrated an understanding of the instructions.   Total encounter time:45 minutes*in face-to-face visit time, chart review, lab review, care coordination, order entry, and documentation of the encounter  time.    Morna Kendall, NP 03/22/24 8:57 AM Medical Oncology and Hematology [facility] [address] [place], [place] [address] Tel. [phone]    Fax. [phone]  *Total Encounter Time  as defined by the Centers for Medicare and Medicaid Services includes, in addition to the face-to-face time of a patient visit (documented in the note above) non-face-to-face time: obtaining and reviewing outside history, ordering and reviewing medications, tests or procedures, care coordination (communications with other health care professionals or caregivers) and documentation in the medical record.

## 2024-03-22 ENCOUNTER — Encounter: Payer: Self-pay | Admitting: Hematology and Oncology

## 2024-04-10 ENCOUNTER — Telehealth: Payer: Self-pay | Admitting: *Deleted

## 2024-04-10 NOTE — Telephone Encounter (Signed)
 Received call from pt with complaint of ongoing x2 weeks left knee pain.  Pt denies redness, swelling, or warmth to extremity.  Pt currently not under active tx.  Per MD, pt educated to f/u with PCP for further evaluation and tx. Pt verbalized understanding.

## 2024-04-17 ENCOUNTER — Ambulatory Visit (INDEPENDENT_AMBULATORY_CARE_PROVIDER_SITE_OTHER): Admitting: General Practice

## 2024-04-17 ENCOUNTER — Encounter: Payer: Self-pay | Admitting: General Practice

## 2024-04-17 ENCOUNTER — Ambulatory Visit (INDEPENDENT_AMBULATORY_CARE_PROVIDER_SITE_OTHER)
Admission: RE | Admit: 2024-04-17 | Discharge: 2024-04-17 | Disposition: A | Discharge status: Home / Self Care | Source: Ambulatory Visit | Attending: General Practice | Admitting: General Practice

## 2024-04-17 ENCOUNTER — Ambulatory Visit: Payer: Self-pay

## 2024-04-17 ENCOUNTER — Ambulatory Visit: Payer: Self-pay | Admitting: General Practice

## 2024-04-17 VITALS — BP 104/58 | HR 57 | Temp 98.1°F | Ht 59.0 in | Wt 119.0 lb

## 2024-04-17 DIAGNOSIS — M25562 Pain in left knee: Secondary | ICD-10-CM | POA: Insufficient documentation

## 2024-04-17 DIAGNOSIS — M25861 Other specified joint disorders, right knee: Secondary | ICD-10-CM | POA: Diagnosis not present

## 2024-04-17 DIAGNOSIS — M1712 Unilateral primary osteoarthritis, left knee: Secondary | ICD-10-CM | POA: Diagnosis not present

## 2024-04-17 NOTE — Assessment & Plan Note (Signed)
 Unclear etiology.  Limited ROM on exam.  Will get x-ray to rule out injury or fracture.   Recommend tylenol  500 mg or ibuprofen  600 mg for pain every 6-8 hours.  Heat to site, lidocaine  patches as needed.   Await results.  Follow up with Dr. Watt in one week if not  better.

## 2024-04-17 NOTE — Patient Instructions (Addendum)
 Recommend tylenol  500 mg and ibuprofen  600 mg together for pain every 6-8 hours.   Heat to site, lidocaine  patches as needed.   If you are not feeling better in 1 week, please call and schedule appointment with Dr. Watt.   It was a pleasure meeting you!

## 2024-04-17 NOTE — Telephone Encounter (Signed)
 FYI Only or Action Required?: FYI only for provider.  Patient was last seen in primary care on 10/10/2023 by Jimmy Charlie FERNS, MD.  Called Nurse Triage reporting Knee Pain and Joint Swelling.  Symptoms began several weeks ago.  Interventions attempted: OTC medications: tylenol .  Symptoms are: gradually worsening.  Triage Disposition: See Physician Within 24 Hours  Patient/caregiver understands and will follow disposition?: Yes Reason for Disposition  [1] Very swollen joint AND [2] no fever  Answer Assessment - Initial Assessment Questions 1. LOCATION and RADIATION: Where is the pain located?      Left knee pain and edema  2. QUALITY: What does the pain feel like?  (e.g., sharp, dull, aching, burning)     Stiff, difficult to straighten out  3. SEVERITY: How bad is the pain? What does it keep you from doing?   (Scale 1-10; or mild, moderate, severe)     Currently 4/10  4. ONSET: When did the pain start? Does it come and go, or is it there all the time?     2 1/2 weeks  5. RECURRENT: Have you had this pain before? If Yes, ask: When, and what happened then?     Denies  6. SETTING: Has there been any recent work, exercise or other activity that involved that part of the body?      Began after walking up steps  7. AGGRAVATING FACTORS: What makes the knee pain worse? (e.g., walking, climbing stairs, running)     Straightening  8. ASSOCIATED SYMPTOMS: Is there any swelling or redness of the knee?     Edema  9. OTHER SYMPTOMS: Do you have any other symptoms? (e.g., calf pain, chest pain, difficulty breathing, fever)     SOB first thing in the morning, states is chronic and unchanged  Protocols used: Knee Pain-A-AH Copied from CRM #[id number]. Topic: Clinical - Red Word Triage >> Apr 17, 2024 10:30 AM [place] E wrote: [facility] that prompted transfer to Nurse Triage: Left leg and knee pain. Patient stated it is a little puffy and it hurts to straighten  out at night, been going on for 3 weeks.

## 2024-04-17 NOTE — Telephone Encounter (Signed)
 I appreciate that she was able to be seen today

## 2024-04-17 NOTE — Progress Notes (Signed)
 Established Patient Office Visit  Subjective   Patient ID: Heidi Mitchell, female    DOB: 10/17/1950  Age: 73 y.o. MRN: [id number]  Chief Complaint  Patient presents with   Knee Pain    Left knee x 3 weeks. Patient has not had a fall but felt a pain when she went up some stairs at her sisters house. Patient has been taking tylenol  and trying to stay off leg.     HPI  Heidi Mitchell is a 73 year old female, patient of Dr. Jimmy, with past medical history of HLD, vertigo, presents today for an acute visit to discuss knee pain.   Left knee pain: symptom onset 3 weeks ago after going up the stairs at her sisters house when she felt something in her knee. She has felt a popping sensation in her left knee since then. She has noticed some puffiness around her knee. Pain is located on her right side of her knee. She does radiate down to her calf at times. Pain is constant. She is not able to straighten her leg out at night. She has not been able to put weight on her leg. Walking and sleeping is when her pain is the worst. No recent falls or injuries. She did take tylenol  one day which did give her some relief but she did not take it again.    Patient Active Problem List   Diagnosis Date Noted   Acute pain of left knee 04/17/2024   Preventative health care 10/10/2023   Atrophic vaginitis 10/10/2023   Port-A-Cath in place 04/15/2023   Genetic testing 03/25/2023   Malignant neoplasm of upper-outer quadrant of right breast in female, estrogen receptor positive (HCC) 03/14/2023   Benign positional vertigo 10/06/2022   Esophageal dysphagia 08/10/2021   Hyperlipidemia, unspecified 07/29/2017   Past Medical History:  Diagnosis Date   GERD (gastroesophageal reflux disease)    Hyperlipidemia    Intracervical pessary    Unspecified disorder of bladder    Uterine prolapse    Past Surgical History:  Procedure Laterality Date   APPENDECTOMY  1990   bartholins cyst  1978   BREAST BIOPSY Left     benign   BREAST BIOPSY Right 03/04/2023   US  RT BREAST BX W LOC DEV 1ST LESION IMG BX SPEC US  GUIDE 03/04/2023 [facility]   BREAST BIOPSY  07/04/2023   US  RT RADIOACTIVE SEED LOC 07/04/2023 [facility]MO[facility]   BREAST BIOPSY  07/04/2023   MM RT RADIOACTIVE SEED LOC MAMMO GUIDE 07/04/2023 [facility]   BREAST LUMPECTOMY WITH RADIOACTIVE SEED AND AXILLARY LYMPH NODE DISSECTION Right 07/05/2023   Procedure: RADIOACTIVE SEED GUIDED RIGHT BREAST LUMPECTOMY AND TARGETED RIGHT AXILLARY LYMPH NODE DISSECTION;  Surgeon: Vernetta Berg, MD;  Location: [facility];  Service: General;  Laterality: Right;  LMA PEC BLOCK   COLONOSCOPY WITH PROPOFOL  N/A 09/27/2018   Procedure: COLONOSCOPY WITH PROPOFOL ;  Surgeon: Therisa Bi, MD;  Location: [facility];  Service: Gastroenterology;  Laterality: N/A;   IR IMAGING GUIDED PORT INSERTION  03/24/2023   PORT-A-CATH REMOVAL Left 07/05/2023   Procedure: REMOVAL PORT-A-CATH;  Surgeon: Vernetta Berg, MD;  Location: [facility];  Service: General;  Laterality: Left;   Allergies  Allergen Reactions   Sertraline  Hcl     Muscle spasms   Excedrin Tension Headache [Acetaminophen -Caffeine] Anxiety         04/17/2024   11:35 AM 03/20/2024   10:16 AM 10/10/2023   10:31 AM  Depression screen  PHQ 2/9  Decreased Interest 0 0 0  Down, Depressed, Hopeless 0 0 0  PHQ - 2 Score 0 0 0  Altered sleeping 3    Tired, decreased energy 0    Change in appetite 0    Feeling bad or failure about yourself  0    Trouble concentrating 0    Moving slowly or fidgety/restless 0    Suicidal thoughts 0    PHQ-9 Score 3    Difficult doing work/chores Not difficult at all         04/17/2024   11:35 AM 11/04/2021   11:25 AM  GAD 7 : Generalized Anxiety Score  Nervous, Anxious, on Edge 0 1  Control/stop worrying 1 1  Worry too much - different things 1 1  Trouble relaxing 0 1  Restless 0 0  Easily annoyed or irritable 1 1  Afraid  - awful might happen 0 0  Total GAD 7 Score 3 5  Anxiety Difficulty Not difficult at all Somewhat difficult      Review of Systems  Constitutional:  Negative for chills and fever.  Respiratory:  Negative for shortness of breath.   Cardiovascular:  Negative for chest pain.  Gastrointestinal:  Negative for abdominal pain, constipation, diarrhea, heartburn, nausea and vomiting.  Genitourinary:  Negative for dysuria, frequency and urgency.  Musculoskeletal:  Positive for joint pain.       Left knee pain.  Neurological:  Negative for dizziness and headaches.  Endo/Heme/Allergies:  Negative for polydipsia.  Psychiatric/Behavioral:  Negative for depression and suicidal ideas. The patient is not nervous/anxious.       Objective:     BP (!) 104/58   Pulse (!) 57   Temp 98.1 F (36.7 C) (Oral)   Ht 4' 11 (1.499 m)   Wt 119 lb (54 kg)   LMP 11/10/2012 (LMP Unknown)   SpO2 97%   BMI 24.04 kg/m  BP Readings from Last 3 Encounters:  04/17/24 (!) 104/58  03/20/24 (!) 124/55  02/09/24 (!) 122/44   Wt Readings from Last 3 Encounters:  04/17/24 119 lb (54 kg)  03/20/24 118 lb 11.2 oz (53.8 kg)  02/09/24 116 lb 14.4 oz (53 kg)      Physical Exam Vitals and nursing note reviewed.  Constitutional:      Appearance: Normal appearance.  Cardiovascular:     Rate and Rhythm: Normal rate and regular rhythm.     Pulses: Normal pulses.     Heart sounds: Normal heart sounds.  Pulmonary:     Effort: Pulmonary effort is normal.     Breath sounds: Normal breath sounds.  Musculoskeletal:        General: Tenderness present. No swelling.     Left knee: Crepitus present. No swelling, erythema or ecchymosis. Decreased range of motion.     Right lower leg: No edema.     Left lower leg: No edema.  Neurological:     Mental Status: She is alert and oriented to person, place, and time.  Psychiatric:        Mood and Affect: Mood normal.        Behavior: Behavior normal.        Thought Content:  Thought content normal.        Judgment: Judgment normal.      No results found for any visits on 04/17/24.     The 10-year ASCVD risk score (Arnett DK, et al., 2019) is: 9.9%    Assessment & Plan:  Acute pain of left knee Assessment & Plan: Unclear etiology.  Limited ROM on exam.  Will get x-ray to rule out injury or fracture.   Recommend tylenol  500 mg or ibuprofen  600 mg for pain every 6-8 hours.  Heat to site, lidocaine  patches as needed.   Await results.  Follow up with Dr. Watt in one week if not  better.  Orders: -     DG Knee 4 Views W/Patella Left     Return if symptoms worsen or fail to improve.    Carrol Aurora, NP

## 2024-04-20 ENCOUNTER — Telehealth: Payer: Self-pay | Admitting: Internal Medicine

## 2024-04-20 NOTE — Telephone Encounter (Signed)
 Please advise not seeing a note where accepted as new patient by Dr KANDICE.   Copied from CRM [id number]. Topic: Appointments - Scheduling Inquiry for Clinic >> Apr 20, 2024 10:06 AM [place] J wrote: Reason for CRM: Patient stated that Dr. Rilla agreed to take her on as a new patient and gave her the personal approval on Wednesday.   She is aware of the same day turn around time and will be looking forward to a call for scheduling today.

## 2024-04-20 NOTE — Telephone Encounter (Signed)
 Agree - spoke with her at husband's appt this week Plz call to schedule - would offer CPE after 10/09/2024, sooner TOC visit if pt desires but not necessary.

## 2024-04-23 NOTE — Telephone Encounter (Signed)
 Pt sch appt with [facility] for 10/10/24

## 2024-05-02 ENCOUNTER — Ambulatory Visit

## 2024-05-15 ENCOUNTER — Other Ambulatory Visit: Payer: Self-pay | Admitting: Adult Health

## 2024-05-15 ENCOUNTER — Telehealth: Payer: Self-pay | Admitting: Gastroenterology

## 2024-05-15 DIAGNOSIS — M8589 Other specified disorders of bone density and structure, multiple sites: Secondary | ICD-10-CM

## 2024-05-15 NOTE — Telephone Encounter (Signed)
 Good Afternoon Dr. Suzann,   I received a call from this patient due to them having a referral over to us  for a colonoscopy. Patient was established with [facility] about 5 years ago. Patient had a colonoscopy back in January of 2020. Those Records can be viewed on EPIC. Would you please advise on how to schedule this patient.    Thank you.

## 2024-05-16 ENCOUNTER — Telehealth: Payer: Self-pay | Admitting: *Deleted

## 2024-05-16 NOTE — Telephone Encounter (Signed)
-----   Message from Morna JAYSON Kendall sent at 05/15/2024  6:10 PM EDT ----- Please let patient know that [facility] is no longer doing bone density testing and I ordered her test to occur at [facility] in October when due.  Please let her know that they will reach out to her to schedule and to give her directions to the site.  Thanks, LC

## 2024-05-16 NOTE — Telephone Encounter (Signed)
Per Lindsey Causey, NP, called pt with message below. Pt verbalized understanding.  

## 2024-05-18 ENCOUNTER — Encounter: Payer: Self-pay | Admitting: Pediatrics

## 2024-06-04 ENCOUNTER — Ambulatory Visit: Payer: Self-pay | Admitting: *Deleted

## 2024-06-04 NOTE — Telephone Encounter (Signed)
 FYI Only or Action Required?: Action required by provider: appt scheduled tomorrow. Does patient need to remove pessary ?SABRA  Patient was last seen in primary care on 04/17/2024 by Vincente Shivers, NP.  Called Nurse Triage reporting Urinary Frequency.  Symptoms began several days ago. Wednesday   Interventions attempted: OTC medications: AZO.  Symptoms are: gradually worsening.  Triage Disposition: See Physician Within 24 Hours  Patient/caregiver understands and will follow disposition?: Yes     Copied from CRM #[id number]. Topic: Clinical - Red Word Triage >> Jun 04, 2024 10:16 AM Larissa RAMAN wrote: [facility] that prompted transfer to Nurse Triage: pain and urgency with urination Reason for Disposition  Age > 50 years  Answer Assessment - Initial Assessment Questions Appt tomorrow with other provider. Please advise/ call back if patient needs to remove pessary. Recommended if sx worsen call back. Please advise     1. SEVERITY: How bad is the pain?  (e.g., Scale 1-10; mild, moderate, or severe)     Mild  2. FREQUENCY: How many times have you had painful urination today?      Feels like pulling with urination 3. PATTERN: Is pain present every time you urinate or just sometimes?      Comes and goes  4. ONSET: When did the painful urination start?      Wednesday  5. FEVER: Do you have a fever? If Yes, ask: What is your temperature, how was it measured, and when did it start?     Na  6. PAST UTI: Have you had a urine infection before? If Yes, ask: When was the last time? and What happened that time?      Bladder infection  7. CAUSE: What do you think is causing the painful urination?  (e.g., UTI, scratch, Herpes sore)     UTI  8. OTHER SYMPTOMS: Do you have any other symptoms? (e.g., blood in urine, flank pain, genital sores, urgency, vaginal discharge)     Urinary urgency frequency, .hx pessary  9. PREGNANCY: Is there any chance you are pregnant? When was  your last menstrual period?     na  Protocols used: Urination Pain - Female-A-AH

## 2024-06-04 NOTE — Telephone Encounter (Signed)
 Appointment with Dr. Avelina 09/23/225

## 2024-06-04 NOTE — Telephone Encounter (Signed)
 Noted, appointment appropriate.  Will see.

## 2024-06-05 ENCOUNTER — Ambulatory Visit (INDEPENDENT_AMBULATORY_CARE_PROVIDER_SITE_OTHER): Admitting: Family Medicine

## 2024-06-05 VITALS — BP 110/64 | HR 63 | Temp 98.9°F | Ht 59.0 in | Wt 119.2 lb

## 2024-06-05 DIAGNOSIS — N3001 Acute cystitis with hematuria: Secondary | ICD-10-CM | POA: Diagnosis not present

## 2024-06-05 DIAGNOSIS — R300 Dysuria: Secondary | ICD-10-CM | POA: Diagnosis not present

## 2024-06-05 LAB — POC URINALSYSI DIPSTICK (AUTOMATED)
Bilirubin, UA: NEGATIVE
Glucose, UA: NEGATIVE
Ketones, UA: NEGATIVE
Nitrite, UA: NEGATIVE
Protein, UA: POSITIVE — AB
Spec Grav, UA: 1.015 (ref 1.010–1.025)
Urobilinogen, UA: 0.2 U/dL
pH, UA: 7.5 (ref 5.0–8.0)

## 2024-06-05 MED ORDER — SULFAMETHOXAZOLE-TRIMETHOPRIM 800-160 MG PO TABS: 1.0000 | ORAL_TABLET | Freq: Two times a day (BID) | ORAL | 0 refills | Status: DC

## 2024-06-05 NOTE — Progress Notes (Signed)
 Patient ID: Heidi Mitchell, female    DOB: 1950-11-21, 73 y.o.   MRN: [id number]  This visit was conducted in person.  BP 110/64   Pulse 63   Temp 98.9 F (37.2 C) (Temporal)   Ht 4' 11 (1.499 m)   Wt 119 lb 4 oz (54.1 kg)   LMP 11/10/2012 (LMP Unknown)   SpO2 94%   BMI 24.09 kg/m    CC:  Chief Complaint  Patient presents with   Dysuria         Subjective:   HPI: Heidi Mitchell is a 73 y.o. female presenting on 06/05/2024 for Dysuria (/)  Dysuria  This is a new problem. The current episode started in the past 7 days. The problem has been waxing and waning. The quality of the pain is described as burning. The pain is at a severity of 6/10. The pain is moderate. There has been no fever. Sexually active: HAs pessary, has stopped estrogen cream given breast cancer.. Associated symptoms include flank pain and frequency. Pertinent negatives include no chills, discharge, hematuria, nausea, urgency or vomiting. Associated symptoms comments: Suprapubic pain   Has issues with back... thinks she may have flared up back.. She has tried increased fluids and acetaminophen  (HAs tried Azo) for the symptoms. The treatment provided mild relief. Her past medical history is significant for recurrent UTIs. There is no history of catheterization, kidney stones or a urological procedure.   Last positive urine culture E. coli 08/03/2023 resistant to Augmentin , ampicillin and cefazolin        Relevant past medical, surgical, family and social history reviewed and updated as indicated. Interim medical history since our last visit reviewed. Allergies and medications reviewed and updated. Outpatient Medications Prior to Visit  Medication Sig Dispense Refill   omeprazole  (PRILOSEC) 20 MG capsule Take 1 capsule (20 mg total) by mouth every other day. On an empty stomach 45 capsule 1   No facility-administered medications prior to visit.     Per HPI unless specifically indicated in ROS section  below Review of Systems  Constitutional:  Negative for chills, fatigue and fever.  HENT:  Negative for congestion.   Eyes:  Negative for pain.  Respiratory:  Negative for cough and shortness of breath.   Cardiovascular:  Negative for chest pain, palpitations and leg swelling.  Gastrointestinal:  Positive for abdominal pain. Negative for nausea and vomiting.  Genitourinary:  Positive for dysuria, flank pain and frequency. Negative for hematuria, urgency and vaginal bleeding.  Musculoskeletal:  Negative for back pain.  Neurological:  Negative for syncope, light-headedness and headaches.  Psychiatric/Behavioral:  Negative for dysphoric mood.    Objective:  BP 110/64   Pulse 63   Temp 98.9 F (37.2 C) (Temporal)   Ht 4' 11 (1.499 m)   Wt 119 lb 4 oz (54.1 kg)   LMP 11/10/2012 (LMP Unknown)   SpO2 94%   BMI 24.09 kg/m   Wt Readings from Last 3 Encounters:  06/05/24 119 lb 4 oz (54.1 kg)  04/17/24 119 lb (54 kg)  03/20/24 118 lb 11.2 oz (53.8 kg)      Physical Exam Constitutional:      General: She is not in acute distress.    Appearance: Normal appearance. She is well-developed. She is not ill-appearing or toxic-appearing.  HENT:     Head: Normocephalic.     Right Ear: Hearing, tympanic membrane, ear canal and external ear normal. Tympanic membrane is not erythematous, retracted or bulging.  Left Ear: Hearing, tympanic membrane, ear canal and external ear normal. Tympanic membrane is not erythematous, retracted or bulging.     Nose: No mucosal edema or rhinorrhea.     Right Sinus: No maxillary sinus tenderness or frontal sinus tenderness.     Left Sinus: No maxillary sinus tenderness or frontal sinus tenderness.     Mouth/Throat:     Pharynx: Uvula midline.  Eyes:     General: Lids are normal. Lids are everted, no foreign bodies appreciated.     Conjunctiva/sclera: Conjunctivae normal.     Pupils: Pupils are equal, round, and reactive to light.  Neck:     Thyroid : No  thyroid  mass or thyromegaly.     Vascular: No carotid bruit.     Trachea: Trachea normal.  Cardiovascular:     Rate and Rhythm: Normal rate and regular rhythm.     Pulses: Normal pulses.     Heart sounds: Normal heart sounds, S1 normal and S2 normal. No murmur heard.    No friction rub. No gallop.  Pulmonary:     Effort: Pulmonary effort is normal. No tachypnea or respiratory distress.     Breath sounds: Normal breath sounds. No decreased breath sounds, wheezing, rhonchi or rales.  Abdominal:     General: Bowel sounds are normal.     Palpations: Abdomen is soft.     Tenderness: There is abdominal tenderness in the suprapubic area. There is no right CVA tenderness or left CVA tenderness.  Musculoskeletal:     Cervical back: Normal range of motion and neck supple.  Skin:    General: Skin is warm and dry.     Findings: No rash.  Neurological:     Mental Status: She is alert.  Psychiatric:        Mood and Affect: Mood is not anxious or depressed.        Speech: Speech normal.        Behavior: Behavior normal. Behavior is cooperative.        Thought Content: Thought content normal.        Judgment: Judgment normal.       Results for orders placed or performed in visit on 06/05/24  POCT Urinalysis Dipstick (Automated)   Collection Time: 06/05/24 10:30 AM  Result Value Ref Range   Color, UA Yellow    Clarity, UA Cloudy    Glucose, UA Negative Negative   Bilirubin, UA Negative    Ketones, UA Negative    Spec Grav, UA 1.015 1.010 - 1.025   Blood, UA Small (1+)    pH, UA 7.5 5.0 - 8.0   Protein, UA Positive (A) Negative   Urobilinogen, UA 0.2 0.2 or 1.0 E.U./dL   Nitrite, UA Negative    Leukocytes, UA Large (3+) (A) Negative    Assessment and Plan  Acute cystitis with hematuria Assessment & Plan: Acute, urinalysis and symptoms consistent with likely acute cystitis. Will treat empirically with Bactrim  double strength 1 tablet twice daily x 3 days.  Send urine for culture.   Encourage patient to push fluids.  If fever or unable to keep down liquids/antibiotics patient will go to ED for further evaluation/treatment.  Return and ER precautions reviewed in detail.   Dysuria -     POCT Urinalysis Dipstick (Automated) -     Urine Culture  Other orders -     Sulfamethoxazole -Trimethoprim ; Take 1 tablet by mouth 2 (two) times daily.  Dispense: 6 tablet; Refill: 0    No  follow-ups on file.   Greig Ring, MD

## 2024-06-05 NOTE — Assessment & Plan Note (Signed)
 Acute, urinalysis and symptoms consistent with likely acute cystitis. Will treat empirically with Bactrim  double strength 1 tablet twice daily x 3 days.  Send urine for culture.  Encourage patient to push fluids.  If fever or unable to keep down liquids/antibiotics patient will go to ED for further evaluation/treatment.  Return and ER precautions reviewed in detail.

## 2024-06-08 ENCOUNTER — Ambulatory Visit: Payer: Self-pay | Admitting: Family Medicine

## 2024-06-08 LAB — URINE CULTURE
MICRO NUMBER:: 17005049
SPECIMEN QUALITY:: ADEQUATE

## 2024-06-22 ENCOUNTER — Encounter: Payer: Self-pay | Admitting: Hematology and Oncology

## 2024-06-25 ENCOUNTER — Ambulatory Visit (INDEPENDENT_AMBULATORY_CARE_PROVIDER_SITE_OTHER)

## 2024-06-25 VITALS — BP 110/64 | Ht 59.0 in | Wt 119.0 lb

## 2024-06-25 DIAGNOSIS — Z0000 Encounter for general adult medical examination without abnormal findings: Secondary | ICD-10-CM | POA: Diagnosis not present

## 2024-06-25 NOTE — Progress Notes (Signed)
 Because this visit was a virtual/telehealth visit,  certain criteria was not obtained, such a blood pressure, CBG if applicable, and timed get up and go. Any medications not marked as taking were not mentioned during the medication reconciliation part of the visit. Any vitals not documented were not able to be obtained due to this being a telehealth visit or patient was unable to self-report a recent blood pressure reading due to a lack of equipment at home via telehealth. Vitals that have been documented are verbally provided by the patient.  This visit was performed by a medical professional under my direct supervision. I was immediately available for consultation/collaboration. I have reviewed and agree with the Annual Wellness Visit documentation.  Subjective:   Heidi Mitchell is a 73 y.o. who presents for a Medicare Wellness preventive visit.  As a reminder, Annual Wellness Visits don't include a physical exam, and some assessments may be limited, especially if this visit is performed virtually. We may recommend an in-person follow-up visit with your provider if needed.  Visit Complete: Virtual I connected with  Heidi Mitchell on 06/25/24 by a audio enabled telemedicine application and verified that I am speaking with the correct person using two identifiers.  Patient Location: Home  Provider Location: Home Office  I discussed the limitations of evaluation and management by telemedicine. The patient expressed understanding and agreed to proceed.  Vital Signs: Because this visit was a virtual/telehealth visit, some criteria may be missing or patient reported. Any vitals not documented were not able to be obtained and vitals that have been documented are patient reported.  VideoDeclined- This patient declined Librarian, academic. Therefore the visit was completed with audio only.  Persons Participating in Visit: Patient.  AWV Questionnaire: No: Patient  Medicare AWV questionnaire was not completed prior to this visit.  Cardiac Risk Factors include: advanced age (>44men, >37 women);dyslipidemia     Objective:    Today's Vitals   06/25/24 1444 06/25/24 1445  BP: 110/64   Weight: 119 lb (54 kg)   Height: 4' 11 (1.499 m)   PainSc:  5    Body mass index is 24.04 kg/m.     06/25/2024    2:43 PM 03/20/2024   10:15 AM 02/16/2024    1:02 PM 02/09/2024   11:10 AM 08/02/2023    9:35 AM 07/05/2023   10:37 AM 05/26/2023   12:58 PM  Advanced Directives  Does Patient Have a Medical Advance Directive? No No No No No No No  Would patient like information on creating a medical advance directive? No - Patient declined  No - Patient declined No - Guardian declined No - Guardian declined No - Patient declined No - Patient declined    Current Medications (verified) Outpatient Encounter Medications as of 06/25/2024  Medication Sig   omeprazole  (PRILOSEC) 20 MG capsule Take 1 capsule (20 mg total) by mouth every other day. On an empty stomach   sulfamethoxazole -trimethoprim  (BACTRIM  DS) 800-160 MG tablet Take 1 tablet by mouth 2 (two) times daily. (Patient not taking: Reported on 06/25/2024)   No facility-administered encounter medications on file as of 06/25/2024.    Allergies (verified) Sertraline  hcl and Excedrin tension headache [acetaminophen -caffeine]   History: Past Medical History:  Diagnosis Date   GERD (gastroesophageal reflux disease)    Hyperlipidemia    Intracervical pessary    Unspecified disorder of bladder    Uterine prolapse    Past Surgical History:  Procedure Laterality Date   APPENDECTOMY  1990   bartholins cyst  1978   BREAST BIOPSY Left    benign   BREAST BIOPSY Right 03/04/2023   US  RT BREAST BX W LOC DEV 1ST LESION IMG BX SPEC US  GUIDE 03/04/2023 [facility]   BREAST BIOPSY  07/04/2023   US  RT RADIOACTIVE SEED LOC 07/04/2023 [facility]   BREAST BIOPSY  07/04/2023   MM RT RADIOACTIVE SEED LOC  MAMMO GUIDE 07/04/2023 [facility]   BREAST LUMPECTOMY WITH RADIOACTIVE SEED AND AXILLARY LYMPH NODE DISSECTION Right 07/05/2023   Procedure: RADIOACTIVE SEED GUIDED RIGHT BREAST LUMPECTOMY AND TARGETED RIGHT AXILLARY LYMPH NODE DISSECTION;  Surgeon: Heidi Berg, MD;  Location: [facility];  Service: General;  Laterality: Right;  LMA PEC BLOCK   COLONOSCOPY WITH PROPOFOL  N/A 09/27/2018   Procedure: COLONOSCOPY WITH PROPOFOL ;  Surgeon: Therisa Bi, MD;  Location: [facility];  Service: Gastroenterology;  Laterality: N/A;   IR IMAGING GUIDED PORT INSERTION  03/24/2023   PORT-A-CATH REMOVAL Left 07/05/2023   Procedure: REMOVAL PORT-A-CATH;  Surgeon: Heidi Berg, MD;  Location: [facility];  Service: General;  Laterality: Left;   Family History  Problem Relation Age of Onset   Alcohol abuse Mother    Cervical cancer Sister 37   Breast cancer Maternal Aunt        dx 9s   Other Son        hole in his brain due to been dropped when he was born   Social History   Socioeconomic History   Marital status: Married    Spouse name: Heidi Mitchell   Number of children: 1   Years of education: high school   Highest education level: Not on file  Occupational History   Not on file  Tobacco Use   Smoking status: Never    Passive exposure: Current   Smokeless tobacco: Never  Vaping Use   Vaping status: Never Used  Substance and Sexual Activity   Alcohol use: No   Drug use: No   Sexual activity: Not Currently    Birth control/protection: Abstinence  Other Topics Concern   Not on file  Social History Narrative   Does not have living will   Husband and son aware of wishes.   Pt would desires CPR but would not want prolonged life support if futile.   03/06/19   From: the area   Living: husband, Heidi Mitchell   Work: retired, but finances OK      Family: Son, Heidi Mitchell who lives nearby, 2 grandchildren (78 and 3 yo), and a Physicist, medical on the way                  Social Drivers of Health   Financial Resource Strain: Low Risk  (06/25/2024)   Overall Financial Resource Strain (CARDIA)    Difficulty of Paying Living Expenses: Not hard at all  Food Insecurity: No Food Insecurity (06/25/2024)   Hunger Vital Sign    Worried About Running Out of Food in the Last Year: Never true    Ran Out of Food in the Last Year: Never true  Transportation Needs: No Transportation Needs (06/25/2024)   PRAPARE - Administrator, Civil Service (Medical): No    Lack of Transportation (Non-Medical): No  Physical Activity: Insufficiently Active (06/25/2024)   Exercise Vital Sign    Days of Exercise per Week: 7 days    Minutes of Exercise per Session: 20 min  Stress: No Stress Concern Present (06/25/2024)   [employer]  of Occupational Health - Occupational Stress Questionnaire    Feeling of Stress: Not at all  Social Connections: Moderately Integrated (06/25/2024)   Social Connection and Isolation Panel    Frequency of Communication with Friends and Family: More than three times a week    Frequency of Social Gatherings with Friends and Family: More than three times a week    Attends Religious Services: 1 to 4 times per year    Active Member of [employer] or Organizations: No    Attends Engineer, structural: Never    Marital Status: Married    Tobacco Counseling Counseling given: Not Answered    Clinical Intake:  Pre-visit preparation completed: Yes  Pain : 0-10 Pain Score: 5  Pain Type: Acute pain Pain Location: Arm Pain Orientation: Right Pain Radiating Towards: the underarm after breast cancer surgery Pain Descriptors / Indicators: Aching Pain Onset: 1 to 4 weeks ago Pain Frequency: Occasional     BMI - recorded: 24.04 Nutritional Risks: None Diabetes: No  No results found for: HGBA1C   How often do you need to have someone help you when you read instructions, pamphlets, or other written materials  from your doctor or pharmacy?: 1 - Never  Interpreter Needed?: No  Information entered by :: Heidi Mitchell,CMA   Activities of Daily Living     06/25/2024    2:47 PM 07/05/2023   10:24 AM  In your present state of health, do you have any difficulty performing the following activities:  Hearing? 0 0  Vision? 0 0  Difficulty concentrating or making decisions? 0 0  Walking or climbing stairs? 0   Dressing or bathing? 0   Doing errands, shopping? 0   Preparing Food and eating ? N   Using the Toilet? N   In the past six months, have you accidently leaked urine? Y   Do you have problems with loss of bowel control? N   Managing your Medications? N   Managing your Finances? N   Housekeeping or managing your Housekeeping? N     Patient Care Team: Heidi Charlie FERNS, MD as PCP - General (Internal Medicine) Heidi Mitchell as Consulting Physician Heidi Potts, MD as Consulting Physician (Hematology and Oncology) Heidi Domino, MD as Attending Physician (Radiation Oncology) Heidi Berg, MD as Consulting Physician (General Surgery)  I have updated your Care Teams any recent Medical Services you may have received from other providers in the past year.     Assessment:   This is a routine wellness examination for Heidi Mitchell.  Hearing/Vision screen Hearing Screening - Comments:: Patient has some trouble hearing  Vision Screening - Comments:: Patient wears glasses    Goals Addressed             This Visit's Progress    Patient Stated       Patient would like to drink more water        Depression Screen     06/25/2024    2:51 PM 04/17/2024   11:35 AM 03/20/2024   10:16 AM 10/10/2023   10:31 AM 10/10/2023   10:02 AM 08/02/2023    9:39 AM 03/23/2023    9:21 AM  PHQ 2/9 Scores  PHQ - 2 Score 0 0 0 0 0 0 0  PHQ- 9 Score 2 3         Fall Risk     06/25/2024    2:47 PM 04/17/2024   11:34 AM 10/10/2023   10:31 AM 10/10/2023   10:02  AM 08/31/2022   11:31 AM  Fall  Risk   Falls in the past year? 0 0 0 0 0  Number falls in past yr: 0 0  0 0  Injury with Fall? 0 0  0 0  Risk for fall due to : No Fall Risks No Fall Risks  No Fall Risks Medication side effect  Follow up Falls evaluation completed Falls evaluation completed  Falls evaluation completed Falls prevention discussed;Education provided;Falls evaluation completed      Data saved with a previous flowsheet row definition    MEDICARE RISK AT HOME:  Medicare Risk at Home Any stairs in or around the home?: No If so, are there any without handrails?: No Home free of loose throw rugs in walkways, pet beds, electrical cords, etc?: Yes Adequate lighting in your home to reduce risk of falls?: Yes Life alert?: No Use of a cane, walker or w/c?: No Grab bars in the bathroom?: Yes Shower chair or bench in shower?: No Elevated toilet seat or a handicapped toilet?: No  TIMED UP AND GO:  Was the test performed?  No  Cognitive Function: 6CIT completed    08/22/2020    1:19 PM 07/17/2019    9:23 AM 06/01/2017    2:14 PM 03/19/2016    3:00 PM  MMSE - Mini Mental State Exam  Orientation to time 5 5 5  5    Orientation to Place 5 5 5  5    Registration 3 3 3  3    Attention/ Calculation 5 5 0  0   Recall 3 3 3  3    Language- name 2 objects   0  0   Language- repeat 1 1 1 1   Language- follow 3 step command   3  3   Language- read & follow direction   0  0   Write a sentence   0  0   Copy design   0  0   Total score   20  20      Data saved with a previous flowsheet row definition        06/25/2024    2:52 PM 08/31/2022   11:33 AM  6CIT Screen  What Year? 0 points 0 points  What month? 0 points 0 points  What time? 0 points 0 points  Count back from 20 0 points 0 points  Months in reverse 0 points 0 points  Repeat phrase 0 points 0 points  Total Score 0 points 0 points    Immunizations Immunization History  Administered Date(s) Administered   Fluad Quad(high Dose 65+) 07/17/2021,  06/03/2022   Influenza,inj,Quad PF,6+ Mos 07/31/2019, 09/17/2020   Pneumococcal Conjugate-13 09/17/2020   Pneumococcal Polysaccharide-23 11/04/2021   Td 01/18/2002    Screening Tests Health Maintenance  Topic Date Due   Zoster Vaccines- Shingrix (1 of 2) Never done   DTaP/Tdap/Td (2 - Tdap) 01/19/2012   Colonoscopy  09/27/2021   Influenza Vaccine  12/11/2024 (Originally 04/13/2024)   Mammogram  02/28/2025   Medicare Annual Wellness (AWV)  06/25/2025   Pneumococcal Vaccine: 50+ Years  Completed   DEXA SCAN  Completed   Hepatitis C Screening  Completed   Meningococcal B Vaccine  Aged Out   COVID-19 Vaccine  Discontinued    Health Maintenance Items Addressed:patient declined   Additional Screening:  Vision Screening: Recommended annual ophthalmology exams for early detection of glaucoma and other disorders of the eye. Is the patient up to date with their annual eye exam?  Yes    Dental Screening: Recommended annual dental exams for proper oral hygiene  Community Resource Referral / Chronic Care Management: CRR required this visit?  No   CCM required this visit?  No   Plan:    I have personally reviewed and noted the following in the patient's chart:   Medical and social history Use of alcohol, tobacco or illicit drugs  Current medications and supplements including opioid prescriptions. Patient is not currently taking opioid prescriptions. Functional ability and status Nutritional status Physical activity Advanced directives List of other physicians Hospitalizations, surgeries, and ER visits in previous 12 months Vitals Screenings to include cognitive, depression, and falls Referrals and appointments  In addition, I have reviewed and discussed with patient certain preventive protocols, quality metrics, and best practice recommendations. A written personalized care plan for preventive services as well as general preventive health recommendations were provided to  patient.   Lyle MARLA Right, [place]   06/25/2024   After Visit Summary: (MyChart) Due to this being a telephonic visit, the after visit summary with patients personalized plan was offered to patient via MyChart   Notes: Nothing significant to report at this time.

## 2024-06-25 NOTE — Patient Instructions (Signed)
 Heidi Mitchell,  Thank you for taking the time for your Medicare Wellness Visit. I appreciate your continued commitment to your health goals. Please review the care plan we discussed, and feel free to reach out if I can assist you further.  Medicare recommends these wellness visits once per year to help you and your care team stay ahead of potential health issues. These visits are designed to focus on prevention, allowing your provider to concentrate on managing your acute and chronic conditions during your regular appointments.  Please note that Annual Wellness Visits do not include a physical exam. Some assessments may be limited, especially if the visit was conducted virtually. If needed, we may recommend a separate in-person follow-up with your provider.  Ongoing Care Seeing your primary care provider every 3 to 6 months helps us  monitor your health and provide consistent, personalized care.   Referrals If a referral was made during today's visit and you haven't received any updates within two weeks, please contact the referred provider directly to check on the status.  Recommended Screenings:  Health Maintenance  Topic Date Due   Zoster (Shingles) Vaccine (1 of 2) Never done   DTaP/Tdap/Td vaccine (2 - Tdap) 01/19/2012   Colon Cancer Screening  09/27/2021   Flu Shot  12/11/2024*   Breast Cancer Screening  02/28/2025   Medicare Annual Wellness Visit  06/25/2025   Pneumococcal Vaccine for age over 15  Completed   DEXA scan (bone density measurement)  Completed   Hepatitis C Screening  Completed   Meningitis B Vaccine  Aged Out   COVID-19 Vaccine  Discontinued  *Topic was postponed. The date shown is not the original due date.       06/25/2024    2:43 PM  Advanced Directives  Does Patient Have a Medical Advance Directive? No  Would patient like information on creating a medical advance directive? No - Patient declined   Advance Care Planning is important because it: Ensures you  receive medical care that aligns with your values, goals, and preferences. Provides guidance to your family and loved ones, reducing the emotional burden of decision-making during critical moments.  Vision: Annual vision screenings are recommended for early detection of glaucoma, cataracts, and diabetic retinopathy. These exams can also reveal signs of chronic conditions such as diabetes and high blood pressure.  Dental: Annual dental screenings help detect early signs of oral cancer, gum disease, and other conditions linked to overall health, including heart disease and diabetes.  Please see the attached documents for additional preventive care recommendations.

## 2024-07-16 ENCOUNTER — Encounter: Payer: Self-pay | Admitting: Gastroenterology

## 2024-07-17 ENCOUNTER — Ambulatory Visit: Admitting: Pediatrics

## 2024-07-20 ENCOUNTER — Other Ambulatory Visit: Payer: Self-pay | Admitting: Family Medicine

## 2024-07-20 NOTE — Telephone Encounter (Signed)
 Spoke with pt to advised her that she would need an appointment before medications could be given. States that she did go to urgent care but there as a 3 hour wait time. Pt called back and made an appointment to be seen her at [facility] on 07/23/24. Advised pt that if her symptoms got worse over the weekend she would need to seek care. She verbalized understanding.

## 2024-07-20 NOTE — Telephone Encounter (Signed)
 Patient called to request refill of Bactrim   Patient reports that she thinks that she has a bladder infection Pressure, frequent urination, pain, low back pain x 3 days Denies fever or any new bleeding   No appointments available at [facility] today.  Advised patient go to UC for treatment in the next 24 hours  Will take Azo, tylenol , and increase water intake for symptoms  Unsure if patient will go to [facility]

## 2024-07-20 NOTE — Telephone Encounter (Signed)
 Copied from CRM #[id number]. Topic: Clinical - Medication Refill >> Jul 20, 2024  1:12 PM Jeshua R wrote: Medication: sulfamethoxazole -trimethoprim  (BACTRIM  DS) 800-160 MG tablet  Has the patient contacted their pharmacy? Yes, completed course, states recurring bladder infection, asking for same antibiotic to be sent again.  This is the patient's preferred pharmacy:  [facility] [address] [place] - [address] [place] [address] Phone: [phone] Fax: [phone]  Is this the correct pharmacy for this prescription? Yes If no, delete pharmacy and type the correct one.   Has the prescription been filled recently? Yes  Is the patient out of the medication? Yes  Has the patient been seen for an appointment in the last year OR does the patient have an upcoming appointment? Yes   Agent: Please be advised that Rx refills may take up to 3 business days. We ask that you follow-up with your pharmacy.

## 2024-07-23 ENCOUNTER — Ambulatory Visit (INDEPENDENT_AMBULATORY_CARE_PROVIDER_SITE_OTHER)

## 2024-07-23 VITALS — BP 122/78 | HR 60 | Temp 98.0°F | Ht 59.0 in | Wt 119.0 lb

## 2024-07-23 DIAGNOSIS — N814 Uterovaginal prolapse, unspecified: Secondary | ICD-10-CM

## 2024-07-23 DIAGNOSIS — R300 Dysuria: Secondary | ICD-10-CM

## 2024-07-23 LAB — URINALYSIS, ROUTINE W REFLEX MICROSCOPIC
Bilirubin Urine: NEGATIVE
Ketones, ur: NEGATIVE
Nitrite: POSITIVE — AB
Specific Gravity, Urine: 1.02 (ref 1.000–1.030)
Total Protein, Urine: 100 — AB
Urine Glucose: NEGATIVE
Urobilinogen, UA: 0.2 (ref 0.0–1.0)
pH: 6.5 (ref 5.0–8.0)

## 2024-07-23 LAB — POC URINALSYSI DIPSTICK (AUTOMATED)
Bilirubin, UA: NEGATIVE
Glucose, UA: NEGATIVE
Ketones, UA: NEGATIVE
Nitrite, UA: NEGATIVE
Protein, UA: POSITIVE — AB
Spec Grav, UA: 1.02 (ref 1.010–1.025)
Urobilinogen, UA: 0.2 U/dL
pH, UA: 6 (ref 5.0–8.0)

## 2024-07-23 MED ORDER — PHENAZOPYRIDINE HCL 200 MG PO TABS: 200.0000 mg | ORAL_TABLET | Freq: Three times a day (TID) | ORAL | 0 refills | Status: AC | PRN

## 2024-07-23 MED ORDER — SULFAMETHOXAZOLE-TRIMETHOPRIM 800-160 MG PO TABS: 1.0000 | ORAL_TABLET | Freq: Two times a day (BID) | ORAL | 0 refills | Status: AC

## 2024-07-23 NOTE — Patient Instructions (Signed)
 Thank you for visiting  Healthcare today! Here's what we talked about: - START Bactrim  for 10 days and Pyridium for 2 to 3 days - Urogynecology will call you - Go to the ED if high fevers greater than 100.37F not responsive to Tylenol , confusion, or worsening abdominal pain

## 2024-07-23 NOTE — Progress Notes (Signed)
 Subjective:   This visit was conducted in person. The patient gave informed consent to the use of Abridge AI technology to record the contents of the encounter as documented below.   Patient ID: Heidi Mitchell, female    DOB: [birth year], 73 y.o.   MRN: [id number]   Discussed the use of AI scribe software for clinical note transcription with the patient, who gave verbal consent to proceed.  History of Present Illness Heidi Mitchell is a 73 year old female with a prolapsed bladder who presents with painful and frequent urination.  She has been experiencing urgency and painful urination since last Sunday, with unbearable pain and urination every three to four minutes. She also has an inability to completely empty her bladder. No fever, chills, nausea, vaginal irritation, changes in vaginal discharge, or itching.  She experienced severe back pain and pain in the lower ribs on Sunday and Monday, which subsided as the urinary symptoms began. Eating a full meal causes a sensation of heaviness and pressure, leading to reduced food intake.  She has a history of a prolapsed bladder and has been using a pessary since 2018. The pessary started peeling and flaking, prompting her to revert to an older one before eventually removing it altogether due to the onset of her current symptoms. She experiences leakage and wears panty liners, which may contribute to her symptoms. She describes the pressure and discomfort as worse than usual.  She recalls having similar urinary symptoms about six weeks ago and was treated for a urinary tract infection at that time. The current episode is more severe than the previous one, with significant pain while urinating and discomfort even while sitting in a recliner.   Review of Systems  All other systems reviewed and are negative.       Allergies  Allergen Reactions   Sertraline  Hcl     Muscle spasms   Excedrin Tension Headache [Acetaminophen -Caffeine] Anxiety     Current Outpatient Medications on File Prior to Visit  Medication Sig Dispense Refill   omeprazole  (PRILOSEC) 20 MG capsule Take 1 capsule (20 mg total) by mouth every other day. On an empty stomach 45 capsule 1   No current facility-administered medications on file prior to visit.    BP 122/78 (BP Location: Left Arm, Patient Position: Sitting, Cuff Size: Normal)   Pulse 60   Temp 98 F (36.7 C) (Oral)   Ht 4' 11 (1.499 m)   Wt 119 lb (54 kg)   LMP 11/10/2012 (LMP Unknown)   SpO2 97%   BMI 24.04 kg/m   Objective:      Physical Exam GENERAL: Alert, cooperative, well developed, no acute distress. HEAD: Normocephalic atraumatic. EYES: Extraocular movements intact bilaterally, pupils round, equal and reactive to light bilaterally, conjunctivae normal bilaterally. ABDOMEN: Soft, mild tenderness to palpation over the lower epigastrium, non distended, without organomegaly, normal bowel sounds, no rebound, no guarding.  No CVA tenderness EXTREMITIES: No cyanosis or edema. NEUROLOGICAL: Oriented to person, place and time, no gait abnormalities, moves all extremities without gross motor or sensory deficit.      Assessment & Plan:   Assessment & Plan Dysuria UA confirms large leukocytes, will send for microscopy and culture.  Of note, she was seen in the clinic on 9/23 for UTI symptoms, at that time treated with Bactrim  for 3-day course, urine culture at that time showed Proteus with sensitivity to it.  No systemic signs of pyelonephritis and negative CVA tenderness at this time.  Will treat with a longer course of Bactrim  this time, given question of whether 3-day course was adequate.  Will avoid Macrobid  given resistance on previous culture.  - Sent urine sample for culture and sensitivity. - Prescribed Bactrim  1 tablet twice daily for 10 days. - Prescribed Pyridium 1 tablet three times a day as needed for 3 days for dysuria. - Advised to use GoodRx for Pyridium cost reduction  if not covered by insurance.  Uterovaginal prolapse Chronic uterovaginal prolapse with increased pressure and discomfort, gradually worsening from before, patient reportedly evaluated many years ago, using pessary.  Would like reevaluation, send referral.  - Referred to urogynecologist for evaluation and management. - Advised to continue using panty liners for leakage management.   Return if symptoms worsen or fail to improve.   Lallie Strahm K Kaynan Klonowski, MD  07/23/24     Contains text generated by Abridge.

## 2024-07-25 ENCOUNTER — Ambulatory Visit: Payer: Self-pay

## 2024-07-26 LAB — URINE CULTURE
MICRO NUMBER:: 17213642
SPECIMEN QUALITY:: ADEQUATE

## 2024-07-31 NOTE — Telephone Encounter (Signed)
 Copied from CRM #[id number]. Topic: Clinical - Lab/Test Results >> Jul 31, 2024  8:05 AM Pinkey ORN wrote: Reason for CRM: Lab Results >> Jul 31, 2024  8:06 AM Pinkey ORN wrote: Patient is requesting an office call back to discuss her lab results.

## 2024-07-31 NOTE — Telephone Encounter (Signed)
 Copied from CRM #[id number]. Topic: Referral - Question >> Jul 31, 2024  8:06 AM Pinkey ORN wrote: Reason for CRM: Urology Referral >> Jul 31, 2024  8:07 AM Pinkey ORN wrote: Patient is requesting a call back in regards to her referral. Patient states no one has reached out to her about it. Please follow up with patient.

## 2024-08-08 ENCOUNTER — Other Ambulatory Visit: Payer: Self-pay

## 2024-08-08 MED ORDER — OMEPRAZOLE 20 MG PO CPDR: 20.0000 mg | DELAYED_RELEASE_CAPSULE | ORAL | 0 refills | Status: DC

## 2024-08-14 DIAGNOSIS — C50911 Malignant neoplasm of unspecified site of right female breast: Secondary | ICD-10-CM | POA: Diagnosis not present

## 2024-08-14 DIAGNOSIS — N63 Unspecified lump in unspecified breast: Secondary | ICD-10-CM | POA: Diagnosis not present

## 2024-08-15 ENCOUNTER — Other Ambulatory Visit: Payer: Self-pay | Admitting: Surgery

## 2024-08-15 ENCOUNTER — Telehealth: Payer: Self-pay | Admitting: Internal Medicine

## 2024-08-15 DIAGNOSIS — C50911 Malignant neoplasm of unspecified site of right female breast: Secondary | ICD-10-CM

## 2024-08-15 DIAGNOSIS — N63 Unspecified lump in unspecified breast: Secondary | ICD-10-CM

## 2024-08-15 NOTE — Telephone Encounter (Signed)
 Copied from CRM [id number]. Topic: Referral - Question >> Aug 15, 2024  3:52 PM Mesmerise C wrote: Reason for CRM: Patient states she got in contact with the [facility] medical center for Urology regarding her referral states they can't take anyone for the next 2 weeks inquiring if she should make an appointment or needs a referral elsewhere

## 2024-08-16 ENCOUNTER — Telehealth: Payer: Self-pay

## 2024-08-16 NOTE — Telephone Encounter (Signed)
 Spoke to pt.

## 2024-08-16 NOTE — Telephone Encounter (Signed)
 Pls call pt and advise that she should still make the appointment, and continue using her pessary in the meantime

## 2024-08-16 NOTE — Telephone Encounter (Signed)
 Received GuardantReveal results which were negative. Nurse called pt to inform them. Pt educated and verbalized understanding.

## 2024-08-20 ENCOUNTER — Encounter: Payer: Self-pay | Admitting: Hematology and Oncology

## 2024-08-28 ENCOUNTER — Inpatient Hospital Stay: Admission: RE | Admit: 2024-08-28 | Discharge: 2024-08-28 | Attending: Surgery | Admitting: Surgery

## 2024-08-28 ENCOUNTER — Ambulatory Visit
Admission: RE | Admit: 2024-08-28 | Discharge: 2024-08-28 | Disposition: A | Source: Ambulatory Visit | Attending: Surgery | Admitting: Surgery

## 2024-08-28 DIAGNOSIS — N63 Unspecified lump in unspecified breast: Secondary | ICD-10-CM

## 2024-08-28 DIAGNOSIS — C50911 Malignant neoplasm of unspecified site of right female breast: Secondary | ICD-10-CM

## 2024-08-31 ENCOUNTER — Ambulatory Visit: Admitting: Gastroenterology

## 2024-09-24 ENCOUNTER — Encounter: Payer: Self-pay | Admitting: Family Medicine

## 2024-09-24 ENCOUNTER — Ambulatory Visit: Payer: Self-pay | Admitting: *Deleted

## 2024-09-24 ENCOUNTER — Ambulatory Visit: Admitting: Family Medicine

## 2024-09-24 VITALS — BP 100/68 | HR 56 | Temp 97.6°F | Ht 59.0 in | Wt 121.4 lb

## 2024-09-24 DIAGNOSIS — N3000 Acute cystitis without hematuria: Secondary | ICD-10-CM

## 2024-09-24 DIAGNOSIS — R82998 Other abnormal findings in urine: Secondary | ICD-10-CM | POA: Diagnosis not present

## 2024-09-24 DIAGNOSIS — R3915 Urgency of urination: Secondary | ICD-10-CM

## 2024-09-24 LAB — POC URINALSYSI DIPSTICK (AUTOMATED)
Bilirubin, UA: NEGATIVE
Blood, UA: NEGATIVE
Glucose, UA: NEGATIVE
Ketones, UA: NEGATIVE
Nitrite, UA: NEGATIVE
Protein, UA: POSITIVE — AB
Spec Grav, UA: 1.015
Urobilinogen, UA: 2 U/dL — AB
pH, UA: 7

## 2024-09-24 MED ORDER — SULFAMETHOXAZOLE-TRIMETHOPRIM 800-160 MG PO TABS: 1.0000 | ORAL_TABLET | Freq: Two times a day (BID) | ORAL | 0 refills | Status: DC

## 2024-09-24 NOTE — Patient Instructions (Addendum)
 Urine again suspicious for infection today Treat with bactrim  twice daily for 5 days Push fluids and rest Avoid bladder irritants like dark sodas, spicy foods, caffeine.

## 2024-09-24 NOTE — Telephone Encounter (Signed)
 Please offer today at 4:30pm for UTI

## 2024-09-24 NOTE — Telephone Encounter (Signed)
 Called patient she will arrive at 4:15 aware that she will need to give urine sample

## 2024-09-24 NOTE — Assessment & Plan Note (Addendum)
 Acute, recurrent (3rd in the past 5 months)  Ucx sent.  Rx bactrim  DS 5d course.  Supportive measures reviewed.  Update if not improving with treatment.  She has appt with UroGyn early 10/2024. Reassess symptoms at f/u visit at end of the month.

## 2024-09-24 NOTE — Progress Notes (Signed)
 " Ph: [phone] Fax: [phone]   Patient ID: Heidi Mitchell, female    DOB: 1950/12/06, 74 y.o.   MRN: [id number]  This visit was conducted in person.  BP 100/68 (BP Location: Left Arm, Patient Position: Sitting, Cuff Size: Normal)   Pulse (!) 56   Temp 97.6 F (36.4 C) (Oral)   Ht 4' 11 (1.499 m)   Wt 121 lb 6.4 oz (55.1 kg)   LMP 11/10/2012   SpO2 98%   BMI 24.52 kg/m    CC: UTI  Subjective:   HPI: Heidi Mitchell is a 74 y.o. female presenting on 09/24/2024 for Acute Visit (Urgency, pain when urinating, lower back pain, /////Pt took 2 days of Azos with no relief)   Transferring care to me from Dr Jimmy who retired late last year.   5d h/o urinary urgency, discomfort with urination but no burning, low back pain and increased urinary frequency. Denies hematuria, fevers/chills, nausea, vomiting.  Treating with Azo OTC at home with only temporary relief.   Notes frequent UTIs - about 2 per year, but she's been having them more recently as per below.   Prior UTIs: 05/2024: UCx grew >100k proteus mirabilis - treated with bactrim  DS 3d course 07/2024: UCx grew 10-50k Aeromonas salmonicida treated with bactrim  DS 10d course.   Upcoming urogynecology appt 10/19/2024 for h/o atrophic vaginitis previously using vaginal pessary and managed with generic lubricant, not topical estrogen in h/o R breast cancer.  H/o R breast cancer dx 02/2023 followed by Dr Vernetta s/p surgery 06/2023, also sees oncology Dr Odean     Relevant past medical, surgical, family and social history reviewed and updated as indicated. Interim medical history since our last visit reviewed. Allergies and medications reviewed and updated. Outpatient Medications Prior to Visit  Medication Sig Dispense Refill   omeprazole  (PRILOSEC) 20 MG capsule Take 1 capsule (20 mg total) by mouth every other day. On an empty stomach 45 capsule 0   No facility-administered medications prior to visit.     Per HPI  unless specifically indicated in ROS section below Review of Systems  Objective:  BP 100/68 (BP Location: Left Arm, Patient Position: Sitting, Cuff Size: Normal)   Pulse (!) 56   Temp 97.6 F (36.4 C) (Oral)   Ht 4' 11 (1.499 m)   Wt 121 lb 6.4 oz (55.1 kg)   LMP 11/10/2012   SpO2 98%   BMI 24.52 kg/m   Wt Readings from Last 3 Encounters:  09/24/24 121 lb 6.4 oz (55.1 kg)  07/23/24 119 lb (54 kg)  06/25/24 119 lb (54 kg)      Physical Exam Vitals and nursing note reviewed.  Constitutional:      Appearance: Normal appearance. She is not ill-appearing.  HENT:     Mouth/Throat:     Mouth: Mucous membranes are moist.     Pharynx: Oropharynx is clear. No oropharyngeal exudate or posterior oropharyngeal erythema.  Abdominal:     General: Bowel sounds are normal. There is no distension.     Palpations: Abdomen is soft. There is no mass.     Tenderness: There is no abdominal tenderness. There is no right CVA tenderness, left CVA tenderness, guarding or rebound.     Hernia: No hernia is present.  Skin:    General: Skin is warm and dry.     Findings: No rash.  Neurological:     Mental Status: She is alert.       Results for  orders placed or performed in visit on 09/24/24  POCT Urinalysis Dipstick (Automated)   Collection Time: 09/24/24  4:36 PM  Result Value Ref Range   Color, UA orage    Clarity, UA clear    Glucose, UA Negative Negative   Bilirubin, UA Negative    Ketones, UA Negative    Spec Grav, UA 1.015 1.010 - 1.025   Blood, UA Negative    pH, UA 7.0 5.0 - 8.0   Protein, UA Positive (A) Negative   Urobilinogen, UA 2.0 (A) 0.2 or 1.0 E.U./dL   Nitrite, UA Negative    Leukocytes, UA Moderate (2+) (A) Negative    Assessment & Plan:   Problem List Items Addressed This Visit     Acute cystitis - Primary   Acute, recurrent (3rd in the past 5 months)  Ucx sent.  Rx bactrim  DS 5d course.  Supportive measures reviewed.  Update if not improving with treatment.   She has appt with UroGyn early 10/2024. Reassess symptoms at f/u visit at end of the month.       Relevant Orders   Urine Culture   Other Visit Diagnoses       Urinary urgency       Relevant Orders   POCT Urinalysis Dipstick (Automated) (Completed)     Leukocytes in urine       Relevant Orders   Urine Culture        Meds ordered this encounter  Medications   sulfamethoxazole -trimethoprim  (BACTRIM  DS) 800-160 MG tablet    Sig: Take 1 tablet by mouth 2 (two) times daily.    Dispense:  10 tablet    Refill:  0    Orders Placed This Encounter  Procedures   Urine Culture   POCT Urinalysis Dipstick (Automated)    Patient Instructions  Urine again suspicious for infection today Treat with bactrim  twice daily for 5 days Push fluids and rest Avoid bladder irritants like dark sodas, spicy foods, caffeine.   Follow up plan: No follow-ups on file.  Anton Blas, MD   "

## 2024-09-24 NOTE — Telephone Encounter (Signed)
 Patient unable to make offered appt this am . Takes patient 30 minutes to drive to office. Recommended UC and patient reports she would like to see a provider. Please advise if any other available appt today . Recommended if sx worsen go to UC or ED.       FYI Only or Action Required?: FYI only for provider: appointment scheduled on 09/24/24 and requesting appt today if possible .  Patient was last seen in primary care on 07/23/2024 by Bennett Reuben POUR, MD.  Called Nurse Triage reporting Urinary Frequency.  Symptoms began several days ago.  Interventions attempted: OTC medications: AZO and Rest, hydration, or home remedies.  Symptoms are: gradually worsening.  Triage Disposition: See HCP Within 4 Hours (Or PCP Triage) 4-24 hours   Patient/caregiver understands and will follow disposition?: Yes               Copied from CRM #[id number]. Topic: Clinical - Red Word Triage >> Sep 24, 2024  7:36 AM Heidi Mitchell wrote: Red Word that prompted transfer to Nurse Triage:    Possible UTI  -frequency -pain Reason for Disposition  Side (flank) or lower back pain present  Answer Assessment - Initial Assessment Questions 1. SYMPTOM: What's the main symptom you're concerned about? (e.g., frequency, incontinence)     Frequency , burning urination  right side  2. ONSET: When did the  sx  start?     Wednesday or Thursday  3. PAIN: Is there any pain? If Yes, ask: How bad is it? (Scale: 1-10; mild, moderate, severe)     Yes 5/10 with urination  4. CAUSE: What do you think is causing the symptoms?     UTI hx of UTIs 5. OTHER SYMPTOMS: Do you have any other symptoms? (e.g., blood in urine, fever, flank pain, pain with urination)     Frequency, burning, with urination right side pain at times. No fever no blood in urine no odor reported.  6. PREGNANCY: Is there any chance you are pregnant? When was your last menstrual period?     na  Protocols used: Urinary Symptoms-A-AH

## 2024-09-25 ENCOUNTER — Ambulatory Visit: Admitting: Family Medicine

## 2024-09-27 ENCOUNTER — Telehealth: Payer: Self-pay

## 2024-09-27 ENCOUNTER — Ambulatory Visit: Payer: Self-pay | Admitting: Family Medicine

## 2024-09-27 DIAGNOSIS — N390 Urinary tract infection, site not specified: Secondary | ICD-10-CM

## 2024-09-27 LAB — URINE CULTURE
MICRO NUMBER:: 17456082
SPECIMEN QUALITY:: ADEQUATE

## 2024-09-27 NOTE — Telephone Encounter (Signed)
 Called patient reviewed all information and repeated back to me. Will call if any questions/ concerns or recurrent symptoms. Pt states she has not previously but will try to start cranberry tablets.

## 2024-09-27 NOTE — Telephone Encounter (Signed)
 Copied from CRM [id number]. Topic: Clinical - Medication Question >> Sep 27, 2024  4:00 PM Avram MATSU wrote: Reason for CRM: patient found a cranberry pills for 500mg  with vitamin C  of 200mg . She would like to know if that's okay to take once daily please advise [phone]

## 2024-09-28 MED ORDER — CRANBERRY-VITAMIN C 450-125 MG PO CAPS: 1.0000 | ORAL_CAPSULE | Freq: Every day | ORAL | Status: AC

## 2024-09-28 NOTE — Telephone Encounter (Signed)
 Called patient reviewed all information and repeated back to me. Will call if any questions.  ? ?

## 2024-09-28 NOTE — Telephone Encounter (Signed)
 Yes ok to take this - I will add to her med list.

## 2024-10-10 ENCOUNTER — Ambulatory Visit: Admitting: Family Medicine

## 2024-10-10 ENCOUNTER — Encounter: Payer: Self-pay | Admitting: Family Medicine

## 2024-10-10 VITALS — BP 104/68 | HR 64 | Temp 98.6°F | Ht 60.5 in | Wt 122.0 lb

## 2024-10-10 DIAGNOSIS — E559 Vitamin D deficiency, unspecified: Secondary | ICD-10-CM | POA: Diagnosis not present

## 2024-10-10 DIAGNOSIS — K219 Gastro-esophageal reflux disease without esophagitis: Secondary | ICD-10-CM | POA: Diagnosis not present

## 2024-10-10 DIAGNOSIS — Z0000 Encounter for general adult medical examination without abnormal findings: Secondary | ICD-10-CM | POA: Insufficient documentation

## 2024-10-10 DIAGNOSIS — C50411 Malignant neoplasm of upper-outer quadrant of right female breast: Secondary | ICD-10-CM | POA: Diagnosis not present

## 2024-10-10 DIAGNOSIS — E785 Hyperlipidemia, unspecified: Secondary | ICD-10-CM | POA: Diagnosis not present

## 2024-10-10 DIAGNOSIS — N390 Urinary tract infection, site not specified: Secondary | ICD-10-CM

## 2024-10-10 DIAGNOSIS — Z860100 Personal history of colon polyps, unspecified: Secondary | ICD-10-CM

## 2024-10-10 DIAGNOSIS — Z7189 Other specified counseling: Secondary | ICD-10-CM | POA: Diagnosis not present

## 2024-10-10 DIAGNOSIS — Z170 Estrogen receptor positive status [ER+]: Secondary | ICD-10-CM | POA: Diagnosis not present

## 2024-10-10 LAB — LIPID PANEL
Cholesterol: 254 mg/dL — ABNORMAL HIGH (ref 28–200)
HDL: 59.1 mg/dL
LDL Cholesterol: 172 mg/dL — ABNORMAL HIGH (ref 10–99)
NonHDL: 194.78
Total CHOL/HDL Ratio: 4
Triglycerides: 113 mg/dL (ref 10.0–149.0)
VLDL: 22.6 mg/dL (ref 0.0–40.0)

## 2024-10-10 LAB — BASIC METABOLIC PANEL WITH GFR
BUN: 17 mg/dL (ref 6–23)
CO2: 29 meq/L (ref 19–32)
Calcium: 9.7 mg/dL (ref 8.4–10.5)
Chloride: 105 meq/L (ref 96–112)
Creatinine, Ser: 0.57 mg/dL (ref 0.40–1.20)
GFR: 89.07 mL/min
Glucose, Bld: 95 mg/dL (ref 70–99)
Potassium: 5.1 meq/L (ref 3.5–5.1)
Sodium: 141 meq/L (ref 135–145)

## 2024-10-10 MED ORDER — OMEPRAZOLE 20 MG PO CPDR: 20.0000 mg | DELAYED_RELEASE_CAPSULE | ORAL | 3 refills | Status: AC

## 2024-10-10 NOTE — Progress Notes (Signed)
 " Ph: [phone] Fax: [phone]   Patient ID: Heidi Mitchell, female    DOB: 05/18/1951, 74 y.o.   MRN: [id number]  This visit was conducted in person.  BP 104/68   Pulse 64   Temp 98.6 F (37 C) (Oral)   Ht 5' 0.5 (1.537 m)   Wt 122 lb (55.3 kg)   LMP 11/10/2012   SpO2 95%   BMI 23.43 kg/m   BP Readings from Last 3 Encounters:  10/10/24 104/68  09/24/24 100/68  07/23/24 122/78   CC: transfer of care /CPE Subjective:   HPI: Heidi Mitchell is a 74 y.o. female presenting on 10/10/2024 for Establish Care   Previously saw Dr Jimmy until he retired late 2025.  Recurrent UTIs:  05/2024: UCx grew >100k proteus mirabilis - treated with bactrim  DS 3d course 07/2024: UCx grew 10-50k Aeromonas salmonicida treated with bactrim  DS 10d course.  Latest UCx grew >100k pansensitive E coli treated with bactrim  DS 1wk course.  She also started cranberry tablets 500mg  daily  She has urogyn appt pending 10/19/2024 for h/o atrophic vaginitis, was previously using vaginal pessary.   Saw health advisor 06/2024 for medicare wellness visit. Note reviewed.   No results found.  Flowsheet Row Office Visit from 10/10/2024 in [facility] [place]  PHQ-2 Total Score 0       10/10/2024    9:51 AM 09/24/2024    4:31 PM 07/23/2024   12:39 PM 06/25/2024    2:47 PM 04/17/2024   11:34 AM  Fall Risk   Falls in the past year? 1 0 0 0 0  Number falls in past yr: 0 0 0 0 0  Injury with Fall? 0 0 0  0  0   Risk for fall due to : History of fall(s)  No Fall Risks No Fall Risks No Fall Risks  Follow up Falls prevention discussed Falls evaluation completed Falls evaluation completed Falls evaluation completed Falls evaluation completed     Data saved with a previous flowsheet row definition   Other doctors: Dr Johnie, Dr Jerilynn surgery, Dr Aneita, Dr Everett, [facility]   H/o R breast cancer dx 02/2023 followed by Dr Vernetta s/p surgery  06/2023, also sees oncology Dr Odean. Trouble tolerating adjuvant Xeloda  so stopped 10/2023.  HLD - onc stopped cholesterol medicine. Will update labs.   Preventative: Colonoscopy 09/2018 - TA x5, rpt 3 yrs Romero) - overdue Breast cancer screening - through oncology in h/o breast cancer 2024. Latest mammogram 08/2024 @ [facility]. Well woman exam - aged out of cervical cancer screening. No pelvic pain or vaginal bleeding.  Lung cancer screening - not eligible  DEXA scan 07/2017 - T -1.6 RFN, pending updated  Flu shot - yearly  COVID shot - has not had Td - 2003  Prevnar-13 09/2020, pneumococcal 10/2021  Shingrix - discussed, declines  Advanced directive discussion - discussed. Would want son Austin Pongratz to be HCPOA. Packet provided today.  Seat belt use discussed Sunscreen use discussed. No changing moles on skin. Non smoker  Alcohol - none Dentist - q6 mo  Eye exam - yearly  Bowel - occ constipation  Bladder - ongoing stress incontinence, without urge incontinence     Relevant past medical, surgical, family and social history reviewed and updated as indicated. Interim medical history since our last visit reviewed. Allergies and medications reviewed and updated. Outpatient Medications Prior to Visit  Medication Sig Dispense Refill   Cranberry-Vitamin C   450-125 MG CAPS Take 1 capsule by mouth daily.     omeprazole  (PRILOSEC) 20 MG capsule Take 1 capsule (20 mg total) by mouth every other day. On an empty stomach 45 capsule 0   sulfamethoxazole -trimethoprim  (BACTRIM  DS) 800-160 MG tablet Take 1 tablet by mouth 2 (two) times daily. 10 tablet 0   No facility-administered medications prior to visit.     Per HPI unless specifically indicated in ROS section below Review of Systems  Constitutional:  Negative for activity change, appetite change, chills, fatigue, fever and unexpected weight change.  HENT:  Negative for hearing loss.   Eyes:  Negative for visual disturbance.   Respiratory:  Positive for shortness of breath (occ). Negative for cough, chest tightness and wheezing.   Cardiovascular:  Positive for chest pain (longstanding, intermittent, not exertional, not reproducible). Negative for palpitations and leg swelling.       Reassuring echo and stress test 2018  Gastrointestinal:  Positive for constipation (occ). Negative for abdominal distention, abdominal pain, blood in stool, diarrhea, nausea and vomiting.  Genitourinary:  Negative for difficulty urinating and hematuria.  Musculoskeletal:  Negative for arthralgias, myalgias and neck pain.  Skin:  Negative for rash.  Neurological:  Positive for headaches. Negative for dizziness, seizures and syncope.  Hematological:  Negative for adenopathy. Does not bruise/bleed easily.  Psychiatric/Behavioral:  Negative for dysphoric mood. The patient is not nervous/anxious.     Objective:  BP 104/68   Pulse 64   Temp 98.6 F (37 C) (Oral)   Ht 5' 0.5 (1.537 m)   Wt 122 lb (55.3 kg)   LMP 11/10/2012   SpO2 95%   BMI 23.43 kg/m   Wt Readings from Last 3 Encounters:  10/10/24 122 lb (55.3 kg)  09/24/24 121 lb 6.4 oz (55.1 kg)  07/23/24 119 lb (54 kg)      Physical Exam Vitals and nursing note reviewed.  Constitutional:      Appearance: Normal appearance. She is not ill-appearing.  HENT:     Head: Normocephalic and atraumatic.     Right Ear: Tympanic membrane, ear canal and external ear normal. There is no impacted cerumen.     Left Ear: Tympanic membrane, ear canal and external ear normal. There is no impacted cerumen.     Mouth/Throat:     Mouth: Mucous membranes are moist.     Pharynx: Oropharynx is clear. No oropharyngeal exudate or posterior oropharyngeal erythema.  Eyes:     General:        Right eye: No discharge.        Left eye: No discharge.     Extraocular Movements: Extraocular movements intact.     Conjunctiva/sclera: Conjunctivae normal.     Pupils: Pupils are equal, round, and  reactive to light.  Neck:     Thyroid : No thyroid  mass or thyromegaly.     Vascular: No carotid bruit.  Cardiovascular:     Rate and Rhythm: Normal rate and regular rhythm.     Pulses: Normal pulses.     Heart sounds: Normal heart sounds. No murmur heard. Pulmonary:     Effort: Pulmonary effort is normal. No respiratory distress.     Breath sounds: Normal breath sounds. No wheezing, rhonchi or rales.  Abdominal:     General: Bowel sounds are normal. There is no distension.     Palpations: Abdomen is soft. There is no mass.     Tenderness: There is no abdominal tenderness. There is no guarding or rebound.  Hernia: No hernia is present.  Musculoskeletal:     Cervical back: Normal range of motion and neck supple. No rigidity.     Right lower leg: No edema.     Left lower leg: No edema.     Comments:  R 3rd digit DIP with deformity due to nodule without redness or warmth   Lymphadenopathy:     Cervical: No cervical adenopathy.  Skin:    General: Skin is warm and dry.     Findings: No rash.  Neurological:     General: No focal deficit present.     Mental Status: She is alert. Mental status is at baseline.  Psychiatric:        Mood and Affect: Mood normal.        Behavior: Behavior normal.       Results for orders placed or performed in visit on 09/24/24  POCT Urinalysis Dipstick (Automated)   Collection Time: 09/24/24  4:36 PM  Result Value Ref Range   Color, UA orage    Clarity, UA clear    Glucose, UA Negative Negative   Bilirubin, UA Negative    Ketones, UA Negative    Spec Grav, UA 1.015 1.010 - 1.025   Blood, UA Negative    pH, UA 7.0 5.0 - 8.0   Protein, UA Positive (A) Negative   Urobilinogen, UA 2.0 (A) 0.2 or 1.0 E.U./dL   Nitrite, UA Negative    Leukocytes, UA Moderate (2+) (A) Negative  Urine Culture   Collection Time: 09/24/24  4:44 PM   Specimen: Urine  Result Value Ref Range   MICRO NUMBER: [id number]    SPECIMEN QUALITY: Adequate    Sample Source  URINE    STATUS: FINAL    ISOLATE 1: Escherichia coli (A)       Susceptibility   Escherichia coli - URINE CULTURE, REFLEX    AMOX/CLAVULANIC <=2 Sensitive     AMPICILLIN/SULBACTAM <=2 Sensitive     CEFAZOLIN * <=1 Not Reportable      * For infections other than uncomplicated UTI caused by E. coli, K. pneumoniae or P. mirabilis: Cefazolin  is resistant if MIC > or = 8 mcg/mL. (Distinguishing susceptible versus intermediate for isolates with MIC < or = 4 mcg/mL requires additional testing.) For uncomplicated UTI caused by E. coli, K. pneumoniae or P. mirabilis: Cefazolin  is susceptible if MIC <32 mcg/mL and predicts susceptible to the oral agents cefaclor, cefdinir, cefpodoxime, cefprozil, cefuroxime, cephalexin  and loracarbef.     CEFTAZIDIME <=0.5 Sensitive     CEFEPIME <=0.12 Sensitive     CEFTRIAXONE <=0.25 Sensitive     CIPROFLOXACIN  <=0.06 Sensitive     LEVOFLOXACIN <=0.12 Sensitive     GENTAMICIN <=1 Sensitive     IMIPENEM <=0.25 Sensitive     MEROPENEM <=0.25 Sensitive     NITROFURANTOIN  <=16 Sensitive     PIP/TAZO <=4 Sensitive     TRIMETH /SULFA * <=20 Sensitive      * For infections other than uncomplicated UTI caused by E. coli, K. pneumoniae or P. mirabilis: Cefazolin  is resistant if MIC > or = 8 mcg/mL. (Distinguishing susceptible versus intermediate for isolates with MIC < or = 4 mcg/mL requires additional testing.) For uncomplicated UTI caused by E. coli, K. pneumoniae or P. mirabilis: Cefazolin  is susceptible if MIC <32 mcg/mL and predicts susceptible to the oral agents cefaclor, cefdinir, cefpodoxime, cefprozil, cefuroxime, cephalexin  and loracarbef. Legend: S = Susceptible  I = Intermediate R = Resistant  NS = Not susceptible SDD = Susceptible  Dose Dependent * = Not Tested  NR = Not Reported **NN = See Therapy Comments     Assessment & Plan:   Problem List Items Addressed This Visit     Health maintenance examination - Primary (Chronic)    Preventative protocols reviewed and updated unless pt declined. Discussed healthy diet and lifestyle.       Advanced directives, counseling/discussion (Chronic)   Advanced directive discussion - discussed. Would want son Anastacia Reinecke to be HCPOA. Packet provided today.       Hyperlipidemia, unspecified   Chronic, onc stopped statin. Update FLP off statin, consider restarting now she's completed cancer treatment. The 10-year ASCVD risk score (Arnett DK, et al., 2019) is: 11.1%   Values used to calculate the score:     Age: 68 years     Clinically relevant sex: Female     Is Non-Hispanic African American: No     Diabetic: No     Tobacco smoker: No     Systolic Blood Pressure: 104 mmHg     Is BP treated: No     HDL Cholesterol: 67 mg/dL     Total Cholesterol: 211 mg/dL       Relevant Orders   Lipid panel   TSH   Basic metabolic panel with GFR   Recurrent UTI   Has started cranberry + Vit C tablets daily Pending urogyn appt       Malignant neoplasm of upper-outer quadrant of right breast in female, estrogen receptor positive (HCC)   Appreciate oncology care      GERD (gastroesophageal reflux disease)   Stable on omeprazole  20mg  every other day       Relevant Medications   omeprazole  (PRILOSEC) 20 MG capsule   Vitamin D  deficiency   Update levels off regular replacement.       Relevant Orders   VITAMIN D  25 Hydroxy (Vit-D Deficiency, Fractures)   Other Visit Diagnoses       History of colonic polyps       Relevant Orders   Ambulatory referral to Gastroenterology        Meds ordered this encounter  Medications   omeprazole  (PRILOSEC) 20 MG capsule    Sig: Take 1 capsule (20 mg total) by mouth every other day. On an empty stomach    Dispense:  45 capsule    Refill:  3    Orders Placed This Encounter  Procedures   Lipid panel   TSH   VITAMIN D  25 Hydroxy (Vit-D Deficiency, Fractures)   Basic metabolic panel with GFR   Ambulatory referral to  Gastroenterology    Referral Priority:   Routine    Referral Type:   Consultation    Referral Reason:   Specialty Services Required    Number of Visits Requested:   1    Patient Instructions  Labs today  I will refer you back to [facility] where you had your last colonoscopy.  Work on advanced directive - packet provided today  Send us  dates of last flu and pneumonia shots Good to see you today.  Return as needed or in 6 months for follow up visit   Follow up plan: Return in about 6 months (around 04/09/2025) for follow up visit.  Anton Blas, MD   "

## 2024-10-10 NOTE — Assessment & Plan Note (Signed)
 Update levels off regular replacement.

## 2024-10-10 NOTE — Assessment & Plan Note (Addendum)
 Chronic, onc stopped statin. Update FLP off statin, consider restarting now she's completed cancer treatment. The 10-year ASCVD risk score (Arnett DK, et al., 2019) is: 11.1%   Values used to calculate the score:     Age: 74 years     Clinically relevant sex: Female     Is Non-Hispanic African American: No     Diabetic: No     Tobacco smoker: No     Systolic Blood Pressure: 104 mmHg     Is BP treated: No     HDL Cholesterol: 67 mg/dL     Total Cholesterol: 211 mg/dL

## 2024-10-10 NOTE — Assessment & Plan Note (Signed)
 Advanced directive discussion - discussed. Would want son Heidi Mitchell to be HCPOA. Packet provided today.

## 2024-10-10 NOTE — Assessment & Plan Note (Signed)
 Appreciate oncology care.

## 2024-10-10 NOTE — Assessment & Plan Note (Signed)
 Stable on omeprazole  20mg  every other day

## 2024-10-10 NOTE — Assessment & Plan Note (Addendum)
 Has started cranberry + Vit C tablets daily Pending urogyn appt

## 2024-10-10 NOTE — Assessment & Plan Note (Signed)
 Preventative protocols reviewed and updated unless pt declined. Discussed healthy diet and lifestyle.

## 2024-10-10 NOTE — Patient Instructions (Addendum)
 Labs today  I will refer you back to [facility] where you had your last colonoscopy.  Work on advanced directive - packet provided today  Send us  dates of last flu and pneumonia shots Good to see you today.  Return as needed or in 6 months for follow up visit

## 2024-10-11 ENCOUNTER — Ambulatory Visit: Payer: Self-pay | Admitting: Family Medicine

## 2024-10-11 DIAGNOSIS — E7800 Pure hypercholesterolemia, unspecified: Secondary | ICD-10-CM

## 2024-10-11 DIAGNOSIS — M85851 Other specified disorders of bone density and structure, right thigh: Secondary | ICD-10-CM

## 2024-10-11 DIAGNOSIS — M8580 Other specified disorders of bone density and structure, unspecified site: Secondary | ICD-10-CM | POA: Insufficient documentation

## 2024-10-11 LAB — VITAMIN D 25 HYDROXY (VIT D DEFICIENCY, FRACTURES): VITD: 13.9 ng/mL — ABNORMAL LOW (ref 30.00–100.00)

## 2024-10-11 LAB — TSH: TSH: 2.32 u[IU]/mL (ref 0.35–5.50)

## 2024-10-11 MED ORDER — VITAMIN D3 50 MCG (2000 UT) PO CAPS: 2000.0000 [IU] | ORAL_CAPSULE | Freq: Every day | ORAL | Status: DC

## 2024-10-11 MED ORDER — ATORVASTATIN CALCIUM 10 MG PO TABS: 10.0000 mg | ORAL_TABLET | Freq: Every day | ORAL | 3 refills | Status: DC

## 2024-10-15 ENCOUNTER — Other Ambulatory Visit: Payer: Self-pay

## 2024-10-15 ENCOUNTER — Telehealth: Payer: Self-pay

## 2024-10-15 DIAGNOSIS — Z860100 Personal history of colon polyps, unspecified: Secondary | ICD-10-CM

## 2024-10-15 MED ORDER — ATORVASTATIN CALCIUM 10 MG PO TABS: 10.0000 mg | ORAL_TABLET | Freq: Every day | ORAL | 3 refills | Status: AC

## 2024-10-15 MED ORDER — VITAMIN D (ERGOCALCIFEROL) 1.25 MG (50000 UNIT) PO CAPS: 50000.0000 [IU] | ORAL_CAPSULE | ORAL | 1 refills | Status: AC

## 2024-10-15 MED ORDER — NA SULFATE-K SULFATE-MG SULF 17.5-3.13-1.6 GM/177ML PO SOLN: 354.0000 mL | Freq: Once | ORAL | 0 refills | Status: AC

## 2024-10-15 NOTE — Telephone Encounter (Signed)
 Gastroenterology Pre-Procedure Review  Request Date: 10/31/2024 Requesting Physician: Dr. Aloysius Nap  PATIENT REVIEW QUESTIONS: The patient responded to the following health history questions as indicated:    1. Are you having any GI issues? no 2. Do you have a personal history of Polyps? yes (09/27/2018 Dr. Therisa polyps) 3. Do you have a family history of Colon Cancer or Polyps? no 4. Diabetes Mellitus? no 5. Joint replacements in the past 12 months?no 6. Major health problems in the past 3 months?no 7. Any artificial heart valves, MVP, or defibrillator?no    MEDICATIONS & ALLERGIES:    Patient reports the following regarding taking any anticoagulation/antiplatelet therapy:   Plavix, Coumadin, Eliquis, Xarelto, Lovenox, Pradaxa, Brilinta, or Effient? no Aspirin ? no  Patient confirms/reports the following medications:  Current Outpatient Medications  Medication Sig Dispense Refill   atorvastatin  (LIPITOR) 10 MG tablet Take 1 tablet (10 mg total) by mouth daily. 90 tablet 3   Cranberry-Vitamin C  450-125 MG CAPS Take 1 capsule by mouth daily.     omeprazole  (PRILOSEC) 20 MG capsule Take 1 capsule (20 mg total) by mouth every other day. On an empty stomach 45 capsule 3   Vitamin D , Ergocalciferol , (DRISDOL ) 1.25 MG (50000 UNIT) CAPS capsule Take 1 capsule (50,000 Units total) by mouth every 7 (seven) days. 12 capsule 1   No current facility-administered medications for this visit.    Patient confirms/reports the following allergies:  Allergies[1]  No orders of the defined types were placed in this encounter.   AUTHORIZATION INFORMATION Primary Insurance: 1D#: Group #:  Secondary Insurance: 1D#: Group #:  SCHEDULE INFORMATION: Date: 10/31/2024 Time: Location: [facility] Dr. Nap     [1]  Allergies Allergen Reactions   Sertraline  Hcl     Muscle spasms   Excedrin Tension Headache [Acetaminophen -Caffeine] Anxiety

## 2024-10-18 NOTE — Progress Notes (Unsigned)
 " New Patient Evaluation and Consultation  Referring Provider: Bennett Reuben POUR, MD PCP: Rilla Baller, MD Date of Service: 10/19/2024  SUBJECTIVE Chief Complaint: No chief complaint on file.  History of Present Illness: Heidi Mitchell is a 74 y.o. White or Caucasian female seen in consultation at the request of Dr Bennett for evaluation of uterine prolapse.    Vaginal bulge symptoms started in 2012 Tried #3 ring with support pessary in 2014 by Dr. Romine for grade 2 cystocele, prior vaginal estrogen use with irritation. Loss to follow-up due to vaginal irritation Evaluated by Dr. Connell in 2017 for grade 3 uterine prolapse with straining, stress urinary incontinence, and postmenopausal bleeding. Resumed pessary use with Trimosans. TVUS 01/20/16 with 5.4 x 2 x 3.2 uterus and 1.38mm endometrial lining. Declined vaginal estrogen use. Trial of size 3 incontinence dish with knob with labial swelling, size 2 incontinence dish pessary with expulsion Size 2 dish with knob fitted but placed size 3, loss to follow-up Recurrent UTI*** with prior evaluation by urology and tried cranberry extract Urine cultures: - 09/24/24 > 100K pansensitive E. Coli. Rx Bactrim  - 07/23/24 10-49K Aeromonas salmonicida. Rx Bactrim  - 06/05/24 > 100K Proteus mirabilis resistant to macrobid  and intermediate to cefazolin .   ***Review of records significant for: ***Vertigo, esophageal dysphagia, GERD, Stage IIA ER+ breast cancer, history of tobacco use  Urinary Symptoms: {urine leakage?:24754} Leaks *** time(s) per {days/wks/mos/yrs:310907}.  Pad use: {NUMBERS 1-10:18281} {pad option:24752} per day.   Patient {ACTION; IS/IS WNU:78978602} bothered by UI symptoms.  Day time voids ***.  Nocturia: *** times per night to void. Voiding dysfunction:  {empties:24755} bladder well.  Patient {DOES NOT does:27190::does not} use a catheter to empty bladder.  When urinating, patient feels {urine symptoms:24756} Drinks: *** per  day  UTIs: {NUMBERS 1-10:18281} UTI's in the last year.   {ACTIONS;DENIES/REPORTS:21021675::Denies} history of {urologic concerns:24757} Susceptibility data from last 90 days. Collected Specimen Info Organism AMOXICILLIN /CLAVULANATE AMPICILLIN/SULBACTAM CEFAZOLIN  CEFEPIME Ceftazidime CEFTRIAXONE Ciprofloxacin  Gentamicin Susc lslt Imipenem LEVOFLOXACIN Meropenem  09/24/24 Urine Escherichia coli  S  S  NR  S  S  S  S  S  S  S  S   Collected Specimen Info Organism Nitrofurantoin  Susc lslt Piperacillin + Tazobactam Trimethoprim /Sulfa   09/24/24 Urine Escherichia coli  S  S  S    Pelvic Organ Prolapse Symptoms:                  Patient {denies/ admits to:24761} a feeling of a bulge the vaginal area. It has been present for {NUMBER 1-10:22536} {days/wks/mos/yrs:310907}.  Patient {denies/ admits to:24761} seeing a bulge.  This bulge {ACTION; IS/IS WNU:78978602} bothersome.  Bowel Symptom: Bowel movements: *** time(s) per {Time; day/week/month:13537} Stool consistency: {stool consistency:24758} Straining: {yes/no:19897}.  Splinting: {yes/no:19897}.  Incomplete evacuation: {yes/no:19897}.  Patient {denies/ admits to:24761} accidental bowel leakage / fecal incontinence  Occurs: *** time(s) per {Time; day/week/month:13537}  Consistency with leakage: {stool consistency:24758} Bowel regimen: {bowel regimen:24759} Last colonoscopy: Date ***, Results *** [facility] Colonoscopy          Current Care Gaps     Colonoscopy (Every 3 Years) Overdue since 09/27/2021    09/27/2018  COLONOSCOPY  Only the first 1 history entries have been loaded, but more history exists.               Sexual Function Sexually active: {yes/no:19897}.  Sexual orientation: {Sexual Orientation:[phone]} Pain with sex: {pain with sex:24762}  Pelvic Pain {denies/ admits to:24761} pelvic pain Location: *** Pain occurs: *** Prior pain treatment: ***  Improved by: *** Worsened by: ***   Past Medical History:   Past Medical History:  Diagnosis Date   GERD (gastroesophageal reflux disease)    Hyperlipidemia    Intracervical pessary    Unspecified disorder of bladder    Uterine prolapse      Past Surgical History:   Past Surgical History:  Procedure Laterality Date   APPENDECTOMY  1990   bartholins cyst  1978   BREAST BIOPSY Left    benign   BREAST BIOPSY Right 03/04/2023   US  RT BREAST BX W LOC DEV 1ST LESION IMG BX SPEC US  GUIDE 03/04/2023 [facility]   BREAST BIOPSY  07/04/2023   US  RT RADIOACTIVE SEED LOC 07/04/2023 [facility]   BREAST BIOPSY  07/04/2023   MM RT RADIOACTIVE SEED LOC MAMMO GUIDE 07/04/2023 [facility]   BREAST LUMPECTOMY WITH RADIOACTIVE SEED AND AXILLARY LYMPH NODE DISSECTION Right 07/05/2023   Procedure: RADIOACTIVE SEED GUIDED RIGHT BREAST LUMPECTOMY AND TARGETED RIGHT AXILLARY LYMPH NODE DISSECTION;  Surgeon: Vernetta Berg, MD;  Location: [facility];  Service: General;  Laterality: Right;  LMA PEC BLOCK   COLONOSCOPY WITH PROPOFOL  N/A 09/27/2018   TA x5, rpt 3 yrs (Anna)   IR IMAGING GUIDED PORT INSERTION  03/24/2023   PORT-A-CATH REMOVAL Left 07/05/2023   Procedure: REMOVAL PORT-A-CATH;  Surgeon: Vernetta Berg, MD;  Location: [facility];  Service: General;  Laterality: Left;     Past OB/GYN History: OB History  Gravida Para Term Preterm AB Living  1 1 1   1   SAB IAB Ectopic Multiple Live Births      1    # Outcome Date GA Lbr Len/2nd Weight Sex Type Anes PTL Lv  1 Term 1968 [redacted]w[redacted]d  6 lb 12 oz (3.062 kg) M Vag-Spont   LIV    Vaginal deliveries: ***,  Forceps/ Vacuum deliveries: ***, Cesarean section: *** Menopausal: {menopausal:24763} Contraception: ***. Last pap smear was ***.  Any history of abnormal pap smears: {yes/no:19897}. No results found for: DIAGPAP, HPVHIGH, ADEQPAP  Medications: Patient has a current medication list which includes the following prescription(s): atorvastatin ,  cranberry-vitamin c , omeprazole , and vitamin d  (ergocalciferol ).   Allergies: Patient is allergic to sertraline  hcl and excedrin tension headache [acetaminophen -caffeine].   Social History: Social History[1]  Relationship status: {relationship status:24764} Patient lives with ***.   Patient {ACTION; IS/IS WNU:78978602} employed ***. Regular exercise: {Yes/No:304960894} History of abuse: {Yes/No:304960894}  Family History:   Family History  Problem Relation Age of Onset   Alcohol abuse Mother    Cervical cancer Sister 30   Other Son        hole in his brain due to been dropped when he was born   Breast cancer Maternal Aunt        dx 23s     Review of Systems: ROS   OBJECTIVE Physical Exam: There were no vitals filed for this visit.  Physical Exam   GU / Detailed Urogynecologic Evaluation:  Pelvic Exam: Normal external female genitalia; Bartholin's and Skene's glands normal in appearance; urethral meatus normal in appearance, no urethral masses or discharge.   CST: {gen negative/positive:315881}  Reflexes: bulbocavernosis {DESC; PRESENT/NOT PRESENT:21021351}, anocutaneous {DESC; PRESENT/NOT PRESENT:21021351} ***bilaterally.  Speculum exam reveals normal vaginal mucosa {With/Without:20273} atrophy. Cervix {exam; gyn cervix:30847}. Uterus {exam; pelvic uterus:30849}. Adnexa {exam; adnexa:12223}.    s/p hysterectomy: Speculum exam reveals normal vaginal mucosa {With/Without:20273}  atrophy and normal vaginal cuff.  Adnexa {exam; adnexa:12223}.    With apex supported, anterior  compartment defect was {reduced:24765}  Pelvic floor strength {Roman # I-V:19040}/V, puborectalis {Roman # I-V:19040}/V external anal sphincter {Roman # I-V:19040}/V  Pelvic floor musculature: Right levator {Tender/Non-tender:20250}, Right obturator {Tender/Non-tender:20250}, Left levator {Tender/Non-tender:20250}, Left obturator {Tender/Non-tender:20250}  POP-Q:   POP-Q                                                Aa                                               Ba                                                 C                                                Gh                                               Pb                                               tvl                                                Ap                                               Bp                                                 D      Rectal Exam:  Normal sphincter tone, {rectocele:24766} distal rectocele, enterocoele {DESC; PRESENT/NOT PRESENT:21021351}, no rectal masses, {sign of:24767} dyssynergia when asking the patient to bear down.  Post-Void Residual (PVR) by Bladder Scan: In order to evaluate bladder emptying, we discussed obtaining a postvoid residual and patient agreed to this procedure.  Procedure: The ultrasound unit was placed on the patient's abdomen in the suprapubic region after the patient had voided.      Laboratory Results: Lab Results  Component Value Date   COLORU orage 09/24/2024   CLARITYU clear 09/24/2024   GLUCOSEUR Negative 09/24/2024   BILIRUBINUR Negative 09/24/2024   KETONESU Negative 09/24/2024   SPECGRAV 1.015 09/24/2024   RBCUR Negative 09/24/2024   PHUR 7.0  09/24/2024   PROTEINUR Positive (A) 09/24/2024   UROBILINOGEN 2.0 (A) 09/24/2024   LEUKOCYTESUR Moderate (2+) (A) 09/24/2024    Lab Results  Component Value Date   CREATININE 0.57 10/10/2024   CREATININE 0.56 02/09/2024   CREATININE 0.58 01/09/2024    No results found for: HGBA1C  Lab Results  Component Value Date   HGB 12.0 02/09/2024     ASSESSMENT AND PLAN Ms. Stober is a 74 y.o. with: No diagnosis found.  There are no diagnoses linked to this encounter.   Lianne ONEIDA Gillis, MD        [1]  Social History Tobacco Use   Smoking status: Never    Passive exposure: Current   Smokeless tobacco: Never  Vaping Use   Vaping status: Never Used  Substance Use Topics    Alcohol use: No   Drug use: No   "

## 2024-10-19 ENCOUNTER — Encounter: Payer: Self-pay | Admitting: Obstetrics

## 2024-10-19 ENCOUNTER — Telehealth: Payer: Self-pay | Admitting: Family Medicine

## 2024-10-19 ENCOUNTER — Ambulatory Visit: Admitting: Obstetrics

## 2024-10-19 ENCOUNTER — Ambulatory Visit: Payer: Self-pay | Admitting: Obstetrics

## 2024-10-19 VITALS — BP 123/56 | HR 55 | Ht 60.0 in | Wt 122.4 lb

## 2024-10-19 DIAGNOSIS — N390 Urinary tract infection, site not specified: Secondary | ICD-10-CM

## 2024-10-19 DIAGNOSIS — N765 Ulceration of vagina: Secondary | ICD-10-CM | POA: Insufficient documentation

## 2024-10-19 DIAGNOSIS — N3941 Urge incontinence: Secondary | ICD-10-CM | POA: Insufficient documentation

## 2024-10-19 DIAGNOSIS — R351 Nocturia: Secondary | ICD-10-CM | POA: Insufficient documentation

## 2024-10-19 DIAGNOSIS — N393 Stress incontinence (female) (male): Secondary | ICD-10-CM | POA: Insufficient documentation

## 2024-10-19 DIAGNOSIS — N8110 Cystocele, unspecified: Secondary | ICD-10-CM | POA: Insufficient documentation

## 2024-10-19 DIAGNOSIS — N950 Postmenopausal bleeding: Secondary | ICD-10-CM

## 2024-10-19 LAB — POCT URINALYSIS DIPSTICK
Bilirubin, UA: NEGATIVE
Blood, UA: NEGATIVE
Glucose, UA: NEGATIVE
Leukocytes, UA: NEGATIVE
Nitrite, UA: NEGATIVE
Protein, UA: NEGATIVE
Spec Grav, UA: 1.025
Urobilinogen, UA: 0.2 U/dL
pH, UA: 6.5

## 2024-10-19 LAB — POCT URINALYSIS DIP (CLINITEK)
Bilirubin, UA: NEGATIVE
Glucose, UA: NEGATIVE mg/dL
Ketones, POC UA: NEGATIVE mg/dL
Nitrite, UA: NEGATIVE
POC PROTEIN,UA: NEGATIVE
Spec Grav, UA: >=1.03 — AB
Urobilinogen, UA: 0.2 U/dL
pH, UA: 6

## 2024-10-19 MED ORDER — ESTRADIOL 0.01 % VA CREA: TOPICAL_CREAM | VAGINAL | 3 refills | Status: AC

## 2024-10-19 NOTE — Telephone Encounter (Signed)
 Copied from CRM #[id number]. Topic: Clinical - Medication Question >> Oct 18, 2024  9:43 AM [name] wrote: Reason for CRM: patient has question regarding the orvastatin calcium , and if she should take it because of the calcium . Patient would like a nurse to call her back.  Patient was informed a follow-up call will be made by the close of business day. >> Oct 18, 2024 10:08 AM Delon T wrote: Will pick up prescription from [facility] this month and then switch to [facility] next month.

## 2024-10-19 NOTE — Assessment & Plan Note (Addendum)
-   For symptomatic vaginal atrophy options include lubrication with a water-based lubricant, personal hygiene measures and barrier protection against wetness, and estrogen replacement in the form of vaginal cream, vaginal tablets, or a time-released vaginal ring.   - discussed proper vulvar care, warm compression, avoid pad use, cotton only underwear and barrier ointment if needed  - encouraged Vit E suppository, moisturizer with Replens/Revaree - Rx low dose vaginal estrogen - Nuswab to r/o infectious etiology - We discussed the potential risks associated with hormone replacement including stroke, heart attack, and blood clots; and the fact that these risks are very low with vaginal estrogen use due to the very low systemic absorption rate of ~ 0.01% with a twice-week regimen. - discussed risk of pessary includes: change in urinary or bowel symptoms, vaginal ulceration, discharge, bleeding, fistula formation. Explained that pt may require multiple sizes and types for fitting.

## 2024-10-19 NOTE — Patient Instructions (Addendum)
 - discussed proper vulvar care, warm compression, avoid pad use, cotton only underwear and barrier ointment if needed  - encouraged Vit E suppository, moisturizer with Replens/Revaree  For vaginal atrophy (thinning of the vaginal tissue that can cause dryness and burning) and UTI prevention we discussed estrogen replacement in the form of vaginal cream.   Start vaginal estrogen therapy nightly for two weeks then 2 times weekly at night. This can be placed with your finger or an applicator inside the vagina and around the urethra.  Please let us  know if the prescription is too expensive and we can look for alternative options.   Is vaginal estrogen therapy safe for me? Vaginal estrogen preparations act on the vaginal skin, and only a very tiny amount is absorbed into the bloodstream (0.01%).  They work in a similar way to hand or face cream.  There is minimal absorption and they are therefore perfectly safe. If you have had breast cancer and have persistent troublesome symptoms which aren't settling with vaginal moisturisers and lubricants, local estrogen treatment may be a possibility, but consultation with your oncologist should take place first.   Constipation: Our goal is to achieve formed bowel movements daily or every-other-day.  You may need to try different combinations of the following options to find what works best for you - everybody's body works differently so feel free to adjust the dosages as needed.  Some options to help maintain bowel health include:  Dietary changes (more leafy greens, vegetables and fruits; less processed foods) Fiber supplementation (Benefiber, FiberCon, Metamucil or Psyllium). Start slow and increase gradually to full dose. Over-the-counter agents such as: stool softeners (Docusate or Colace) and/or laxatives (Miralax, milk of magnesia)  Power Pudding is a natural mixture that may help your constipation.  To make blend 1 cup applesauce, 1 cup wheat bran, and 3/4  cup prune juice, refrigerate and then take 1 tablespoon daily with a large glass of water as needed.   Women should try to eat at least 21 to 25 grams of fiber a day, while men should aim for 30 to 38 grams a day. You can add fiber to your diet with food or a fiber supplement such as psyllium (metamucil), benefiber, or fibercon.   Here's a look at how much dietary fiber is found in some common foods. When buying packaged foods, check the Nutrition Facts label for fiber content. It can vary among brands.  Fruits Serving size Total fiber (grams)*  Raspberries 1 cup 8.0  Pear 1 medium 5.5  Apple, with skin 1 medium 4.5  Banana 1 medium 3.0  Orange 1 medium 3.0  Strawberries 1 cup 3.0   Vegetables Serving size Total fiber (grams)*  Green peas, boiled 1 cup 9.0  Broccoli, boiled 1 cup chopped 5.0  Turnip greens, boiled 1 cup 5.0  Brussels sprouts, boiled 1 cup 4.0  Potato, with skin, baked 1 medium 4.0  Sweet corn, boiled 1 cup 3.5  Cauliflower, raw 1 cup chopped 2.0  Carrot, raw 1 medium 1.5   Grains Serving size Total fiber (grams)*  Spaghetti, whole-wheat, cooked 1 cup 6.0  Barley, pearled, cooked 1 cup 6.0  Bran flakes 3/4 cup 5.5  Quinoa, cooked 1 cup 5.0  Oat bran muffin 1 medium 5.0  Oatmeal, instant, cooked 1 cup 5.0  Popcorn, air-popped 3 cups 3.5  Brown rice, cooked 1 cup 3.5  Bread, whole-wheat 1 slice 2.0  Bread, rye 1 slice 2.0   Legumes, nuts and seeds Serving size  Total fiber (grams)*  Split peas, boiled 1 cup 16.0  Lentils, boiled 1 cup 15.5  Black beans, boiled 1 cup 15.0  Baked beans, canned 1 cup 10.0  Chia seeds 1 ounce 10.0  Almonds 1 ounce (23 nuts) 3.5  Pistachios 1 ounce (49 nuts) 3.0  Sunflower kernels 1 ounce 3.0  *Rounded to nearest 0.5 gram. Source: [employer] for [employer], [employer] Release    For treatment of stress urinary incontinence, which is leakage with physical activity/movement/strainging/coughing, we  discussed expectant management versus nonsurgical options versus surgery. Nonsurgical options include weight loss, physical therapy, as well as a pessary.  Surgical options include a midurethral sling, which is a synthetic mesh sling that acts like a hammock under the urethra to prevent leakage of urine, a Burch urethropexy, and transurethral injection of a bulking agent.   For night time frequency: - avoid fluid intake 3 hours before bedtime  Postmenopausal bleeding:   Discussed evaluation is indicated to rule out precancerous or cancerous lesion and options include pelvic ultrasound or endometrial biopsy.  Risks of endometrial biopsy include bleeding, pain/cramping, infection and rare risk of uterine perforation and if that occurs possible laparoscopy/surgery.   - we will start with transvaginal ultrasound Please call radiology at [phone] to schedule your imaging study today

## 2024-10-19 NOTE — Assessment & Plan Note (Addendum)
-   POCT UA + leuk, protein, heme, resolved with catheterized testing. PVR 31mL - For treatment of stress urinary incontinence,  non-surgical options include expectant management, weight loss, physical therapy, as well as a pessary.  Surgical options include a midurethral sling, Burch urethropexy, and transurethral injection of a bulking agent. - Rx low dose vaginal estrogen - resume size 2 incontinence dish pessary after resolution of vaginal ulcer

## 2024-10-19 NOTE — Assessment & Plan Note (Signed)
-   avoid fluid intake 3 hours before bedtime - denies snoring

## 2024-10-19 NOTE — Assessment & Plan Note (Addendum)
-   For treatment of pelvic organ prolapse, we discussed options for management including expectant management, conservative management, and surgical management, such as Kegels, a pessary, pelvic floor physical therapy, and specific surgical procedures. - self managed size 2 incontinence dish every 3 months with spotting for 3 years and ulceration on exam. Removed and avoid replacement until return visit - prior use of size 3 ring with support with increased urinary incontinence - encouraged splinting as needed if she experiences sensation of incomplete emptying

## 2024-10-19 NOTE — Assessment & Plan Note (Signed)
-   Recurrent UTI with prior evaluation by urology - For treatment of recurrent urinary tract infections, we discussed management of recurrent UTIs including prophylaxis with a daily low dose antibiotic, transvaginal estrogen therapy, D-mannose, and cranberry supplements.  We discussed the role of diagnostic testing such as cystoscopy and upper tract imaging.   - Rx low dose vaginal estrogen

## 2024-10-19 NOTE — Assessment & Plan Note (Addendum)
-   reports intermittent spotting for 3 years - posterior vaginal wall ulceration with bleeding - advised against pessary use until repeat exam with resolution of ulceration - Discussed evaluation is indicated to rule out precancerous or cancerous lesion and options include pelvic ultrasound or endometrial biopsy.  Risks of endometrial biopsy include bleeding, pain/cramping, infection and rare risk of uterine perforation and if that occurs possible laparoscopy/surgery.   - pending TVUS - TVUS 01/20/16 with 5.4 x 2 x 3.2 uterus and 1.52mm endometrial lining.

## 2024-10-31 ENCOUNTER — Ambulatory Visit: Admit: 2024-10-31

## 2024-11-05 ENCOUNTER — Ambulatory Visit (HOSPITAL_COMMUNITY)

## 2024-11-21 ENCOUNTER — Ambulatory Visit: Admitting: Hematology and Oncology

## 2025-01-29 ENCOUNTER — Ambulatory Visit: Admitting: Obstetrics

## 2025-04-09 ENCOUNTER — Ambulatory Visit: Admitting: Family Medicine
